# Patient Record
Sex: Female | Born: 1955
Health system: Southern US, Community
[De-identification: ages and names within clinical notes are randomized; demographics above are authoritative.]

## PROBLEM LIST (undated history)

## (undated) DIAGNOSIS — F32A Depression, unspecified: Secondary | ICD-10-CM

## (undated) DIAGNOSIS — I10 Essential (primary) hypertension: Secondary | ICD-10-CM

## (undated) DIAGNOSIS — Z808 Family history of malignant neoplasm of other organs or systems: Secondary | ICD-10-CM

## (undated) DIAGNOSIS — Z803 Family history of malignant neoplasm of breast: Secondary | ICD-10-CM

## (undated) DIAGNOSIS — Z800 Family history of malignant neoplasm of digestive organs: Secondary | ICD-10-CM

## (undated) DIAGNOSIS — C50919 Malignant neoplasm of unspecified site of unspecified female breast: Secondary | ICD-10-CM

## (undated) DIAGNOSIS — F419 Anxiety disorder, unspecified: Secondary | ICD-10-CM

## (undated) DIAGNOSIS — K219 Gastro-esophageal reflux disease without esophagitis: Secondary | ICD-10-CM

## (undated) DIAGNOSIS — E871 Hypo-osmolality and hyponatremia: Secondary | ICD-10-CM

## (undated) DIAGNOSIS — T7840XA Allergy, unspecified, initial encounter: Secondary | ICD-10-CM

## (undated) DIAGNOSIS — E559 Vitamin D deficiency, unspecified: Secondary | ICD-10-CM

## (undated) DIAGNOSIS — E785 Hyperlipidemia, unspecified: Secondary | ICD-10-CM

## (undated) HISTORY — DX: Malignant neoplasm of unspecified site of unspecified female breast: C50.919

## (undated) HISTORY — DX: Depression, unspecified: F32.A

## (undated) HISTORY — DX: Family history of malignant neoplasm of digestive organs: Z80.0

## (undated) HISTORY — DX: Hyperlipidemia, unspecified: E78.5

## (undated) HISTORY — DX: Allergy, unspecified, initial encounter: T78.40XA

## (undated) HISTORY — DX: Essential (primary) hypertension: I10

## (undated) HISTORY — DX: Vitamin D deficiency, unspecified: E55.9

## (undated) HISTORY — PX: OOPHORECTOMY: SHX86

## (undated) HISTORY — PX: SALPINGECTOMY: SHX328

## (undated) HISTORY — PX: NECK SURGERY: SHX720

## (undated) HISTORY — DX: Family history of malignant neoplasm of breast: Z80.3

## (undated) HISTORY — DX: Family history of malignant neoplasm of other organs or systems: Z80.8

## (undated) HISTORY — DX: Anxiety disorder, unspecified: F41.9

---

## 2013-03-21 ENCOUNTER — Other Ambulatory Visit (HOSPITAL_COMMUNITY): Payer: Self-pay | Admitting: Physician Assistant

## 2013-03-21 DIAGNOSIS — Z139 Encounter for screening, unspecified: Secondary | ICD-10-CM

## 2013-03-29 ENCOUNTER — Ambulatory Visit (HOSPITAL_COMMUNITY)
Admission: RE | Admit: 2013-03-29 | Discharge: 2013-03-29 | Disposition: A | Discharge status: Home / Self Care | Payer: Self-pay | Source: Ambulatory Visit | Attending: Physician Assistant | Admitting: Physician Assistant

## 2013-03-29 DIAGNOSIS — Z139 Encounter for screening, unspecified: Secondary | ICD-10-CM

## 2014-06-03 DIAGNOSIS — S13101A Dislocation of unspecified cervical vertebrae, initial encounter: Secondary | ICD-10-CM | POA: Insufficient documentation

## 2021-01-29 ENCOUNTER — Other Ambulatory Visit (HOSPITAL_COMMUNITY): Payer: Self-pay | Admitting: Family Medicine

## 2021-01-29 DIAGNOSIS — N63 Unspecified lump in unspecified breast: Secondary | ICD-10-CM

## 2021-02-17 ENCOUNTER — Other Ambulatory Visit: Payer: Self-pay

## 2021-02-17 ENCOUNTER — Ambulatory Visit (HOSPITAL_COMMUNITY)
Admission: RE | Admit: 2021-02-17 | Discharge: 2021-02-17 | Disposition: A | Discharge status: Home / Self Care | Payer: Self-pay | Source: Ambulatory Visit | Attending: Family Medicine | Admitting: Family Medicine

## 2021-02-17 DIAGNOSIS — N63 Unspecified lump in unspecified breast: Secondary | ICD-10-CM

## 2021-02-18 ENCOUNTER — Other Ambulatory Visit (HOSPITAL_COMMUNITY): Payer: Self-pay | Admitting: Obstetrics and Gynecology

## 2021-02-18 ENCOUNTER — Other Ambulatory Visit (HOSPITAL_COMMUNITY): Payer: Self-pay | Admitting: Nurse Practitioner

## 2021-02-18 DIAGNOSIS — R928 Other abnormal and inconclusive findings on diagnostic imaging of breast: Secondary | ICD-10-CM

## 2021-02-19 ENCOUNTER — Ambulatory Visit: Payer: Self-pay | Admitting: *Deleted

## 2021-02-19 ENCOUNTER — Other Ambulatory Visit: Payer: Self-pay

## 2021-02-19 ENCOUNTER — Other Ambulatory Visit (HOSPITAL_COMMUNITY)
Admission: RE | Admit: 2021-02-19 | Discharge: 2021-02-19 | Disposition: A | Discharge status: Home / Self Care | Payer: Medicaid Other | Source: Ambulatory Visit | Attending: Obstetrics and Gynecology | Admitting: Obstetrics and Gynecology

## 2021-02-19 VITALS — BP 134/82 | Wt 127.1 lb

## 2021-02-19 DIAGNOSIS — N6452 Nipple discharge: Secondary | ICD-10-CM | POA: Insufficient documentation

## 2021-02-19 DIAGNOSIS — Z1239 Encounter for other screening for malignant neoplasm of breast: Secondary | ICD-10-CM

## 2021-02-19 DIAGNOSIS — N6320 Unspecified lump in the left breast, unspecified quadrant: Secondary | ICD-10-CM

## 2021-02-19 NOTE — Patient Instructions (Addendum)
Explained breast self awareness with Samantha Mcfarland. Patient refused Pap smear today. Explained the importance of cervical cancer screenings. Patient will call to schedule appointment when ready. Let her know [facility] will cover Pap smears every 3 years unless has a history of abnormal Pap smears. Referred patient to [facility] for a left breast biopsy per recommendation. Appointment scheduled Tuesday, February 24, 2021 at 0900. Patient aware of appointment and will be there. Discussed smoking cessation with patient. Referred to the [facility] Quitline and gave resources to the free smoking cessation classes at [facility]. Denisa Lemaitre verbalized understanding.  Angline Schweigert, Arvil Chaco, RN 1:02 PM

## 2021-02-19 NOTE — Progress Notes (Signed)
[name] is a 65 y.o. female who presents to [facility] clinic today with complaint of left breast lump, pain, and bloody discharge. Patient states she first noticed the lump in May 2022 and it has increased in size. Patient complained of left breast pain that is constant that has increased since is started in May. Patient rates the pain at a 8-10 out of 10. Patient states the bloody discharge is spontaneous and she has noticed it twice. Patient had a diagnostic mammogram and left breast ultrasound completed 02/17/2021 that a left breast ultrasound guided is recommended for follow-up.   Pap Smear: Pap smear not completed today. Last Pap smear was over 5 years ago and was normal per patient. Patient stated she is unsure where she had her Pap smear completed. Per patient has no history of an abnormal Pap smear. Last Pap smear result is not available in Epic.   Physical exam: Breasts Breasts symmetrical. No skin abnormalities bilateral breasts. No nipple retraction bilateral breasts. No nipple discharge right breast. Spontaneous bloody nipple discharge observed left breast and oozed out when palpated lump on exam. Sample of discharge sent to Cytology for evaluation. No lymphadenopathy. No lumps palpated right breast. Palpated a left breast mass between 3 o'clock and 6 o'clock. Patient complained of pain when palpated left breast lump on exam.  MS DIGITAL DIAG TOMO BILAT  Result Date: 02/17/2021 CLINICAL DATA:  65 year old female with a palpable area of concern in the left breast. EXAM: DIGITAL DIAGNOSTIC BILATERAL MAMMOGRAM WITH TOMOSYNTHESIS AND CAD; ULTRASOUND LEFT BREAST LIMITED TECHNIQUE: Bilateral digital diagnostic mammography and breast tomosynthesis was performed. The images were evaluated with computer-aided detection.; Targeted ultrasound examination of the left breast was performed. COMPARISON:  Previous exams. ACR Breast Density Category d: The breast tissue is extremely dense, which lowers the  sensitivity of mammography. FINDINGS: There is an ill-defined probable mass corresponding to the area of palpable concern in the upper-outer left breast with an additional possible mass seen in the central left breast on the MLO tomograms. No suspicious masses or calcifications are seen in the right breast. Physical examination at site of palpable concern in the left breast reveals a large firm masslike area involving the outer left breast at the approximate 3 o'clock position. Targeted ultrasound of the outer left breast was performed demonstrating several ill-defined masses with a mass at 3 o'clock 5 cm from nipple measuring 1.8 x 2 x 1.2 cm. An additional mass at 3 o'clock 2 cm from nipple measures 0.8 x 0.8 x 1 cm. There is a mass in the immediate retroareolar left breast measuring 1.1 x 0.4 x 1.4 cm. There is an adjacent mixed cystic/solid mass in the retroareolar left breast likely connecting with a duct measuring 0.9 cm. Together these masses measure up to 5.7 cm. No lymphadenopathy seen in the left axilla. IMPRESSION: Large palpable mass/cluster of masses in the outer left breast all together measuring up to 5.7 cm suspicious for malignancy. RECOMMENDATION: Recommend ultrasound-guided biopsy of 2 of the masses in the left breast at the 3 o'clock position, suggest the mass in the immediate retroareolar left breast and the more peripheral mass at 3 o'clock 5 cm from nipple. I have discussed the findings and recommendations with the patient. If applicable, a reminder letter will be sent to the patient regarding the next appointment. BI-RADS CATEGORY  5: Highly suggestive of malignancy. Electronically Signed   By: Everlean Alstrom M.D.   On: 02/17/2021 16:38       Pelvic/Bimanual  Patient refused Pap smear today. Explained the importance of cervical cancer screenings. Patient will call to schedule appointment when ready.   Smoking History: Patient is a current smoker. Discussed smoking cessation with  patient. Referred to the [facility] Quitline and gave resources to the free smoking cessation classes at [facility].   Patient Navigation: Patient education provided. Access to services provided for patient through [facility] program.   Colorectal Cancer Screening: Per patient has never had colonoscopy completed. No complaints today.    Breast and Cervical Cancer Risk Assessment: Patient does not have family history of breast cancer, known genetic mutations, or radiation treatment to the chest before age 53. Patient does not have history of cervical dysplasia, immunocompromised, or DES exposure in-utero.  Risk Assessment     Risk Scores       02/19/2021   Last edited by: Royston Bake, CMA   5-year risk: 2 %   Lifetime risk: 7.9 %            A: B[facility]CP exam without pap smear Complaint of left breast lump, pain, and discharge.  P: Referred patient to [facility] for a left breast biopsy per recommendation. Appointment scheduled Tuesday, February 24, 2021 at 0900.  Loletta Parish, RN 02/19/2021 1:02 PM

## 2021-02-20 LAB — CYTOLOGY - NON PAP

## 2021-02-24 ENCOUNTER — Other Ambulatory Visit (HOSPITAL_COMMUNITY): Payer: Self-pay | Admitting: Obstetrics and Gynecology

## 2021-02-24 ENCOUNTER — Encounter (HOSPITAL_COMMUNITY): Payer: Self-pay

## 2021-02-24 ENCOUNTER — Ambulatory Visit (HOSPITAL_COMMUNITY)
Admission: RE | Admit: 2021-02-24 | Discharge: 2021-02-24 | Disposition: A | Discharge status: Home / Self Care | Payer: Medicaid Other | Source: Ambulatory Visit | Attending: Obstetrics and Gynecology | Admitting: Obstetrics and Gynecology

## 2021-02-24 ENCOUNTER — Telehealth: Payer: Self-pay

## 2021-02-24 ENCOUNTER — Other Ambulatory Visit: Payer: Self-pay

## 2021-02-24 DIAGNOSIS — R928 Other abnormal and inconclusive findings on diagnostic imaging of breast: Secondary | ICD-10-CM

## 2021-02-24 MED ORDER — LIDOCAINE-EPINEPHRINE (PF) 2 %-1:200000 IJ SOLN
INTRAMUSCULAR | Status: AC
  Administered 2021-02-24: 10 mL
  Filled 2021-02-24: qty 20

## 2021-02-24 MED ORDER — LIDOCAINE HCL (PF) 2 % IJ SOLN
INTRAMUSCULAR | Status: AC
  Administered 2021-02-24: 10 mL
  Filled 2021-02-24: qty 20

## 2021-02-24 MED ORDER — SODIUM BICARBONATE 4.2 % IV SOLN
INTRAVENOUS | Status: AC
  Administered 2021-02-24: 2.5 meq
  Filled 2021-02-24: qty 10

## 2021-02-24 NOTE — Telephone Encounter (Signed)
Patient informed pathology results for left breast discharge, no malignant (cancerous) cells identified. Patient verbalized understanding.

## 2021-02-24 NOTE — Sedation Documentation (Signed)
PT tolerated left breast biopsies well today with NAD noted. PT verbalized understanding of discharge instructions. PT ambulated back to the mammogram area this time and given ice pack and d/c instructions.

## 2021-02-27 LAB — SURGICAL PATHOLOGY

## 2021-03-03 ENCOUNTER — Ambulatory Visit (HOSPITAL_COMMUNITY): Payer: Self-pay

## 2021-03-03 ENCOUNTER — Encounter (HOSPITAL_COMMUNITY): Payer: Self-pay

## 2021-03-05 ENCOUNTER — Other Ambulatory Visit: Payer: Self-pay

## 2021-03-05 ENCOUNTER — Other Ambulatory Visit (HOSPITAL_COMMUNITY): Payer: Self-pay | Admitting: General Surgery

## 2021-03-05 ENCOUNTER — Ambulatory Visit (INDEPENDENT_AMBULATORY_CARE_PROVIDER_SITE_OTHER): Payer: Medicaid Other | Admitting: General Surgery

## 2021-03-05 VITALS — BP 120/76 | HR 64 | Temp 98.3°F | Resp 16 | Ht 67.0 in | Wt 123.0 lb

## 2021-03-05 DIAGNOSIS — D0512 Intraductal carcinoma in situ of left breast: Secondary | ICD-10-CM

## 2021-03-05 DIAGNOSIS — Z853 Personal history of malignant neoplasm of breast: Secondary | ICD-10-CM

## 2021-03-05 DIAGNOSIS — N6320 Unspecified lump in the left breast, unspecified quadrant: Secondary | ICD-10-CM | POA: Insufficient documentation

## 2021-03-05 MED ORDER — OXYCODONE HCL 5 MG PO TABS: 5.0000 mg | ORAL_TABLET | ORAL | 0 refills | Status: DC | PRN

## 2021-03-05 NOTE — Progress Notes (Signed)
[facility] History and Physical  Reason for Referral: Left breast DCIS and left breast mass discordant pathology  Referring Physician: Olga Coaster, FNP    Chief Complaint   New Patient (Initial Visit)     Samantha Mcfarland is a 65 y.o. female.  HPI: Samantha Mcfarland is a 65 yo who has noticed a left breast mass for 4+ months. She had a mammogram and ultrasounds with biopsy. DCIS was noted at one site and a 5+ cm mass with fibrocystic findings on pathology. The mass was noted to be discordant with the imaging. She was referred to me for surgery and had recommendations for MRI pending her decisions for surgery.   She has had bloody nipple discharge of the left breast for some time. She has never had prior masses or biopsies. She has a maternal grandmother that had breast cancer and maternal aunts.  Her father had pancreatic cancer and testicular cancer. She has never had a prior abnormal mammogram. She is here today with her sister. She does drink alcohol daily.   Past Medical History:  Diagnosis Date   Hyperlipidemia    Hypertension     Past Surgical History:  Procedure Laterality Date   OOPHORECTOMY     SALPINGECTOMY      Family History  Problem Relation Age of Onset   Stroke Mother    Pancreatic cancer Father     Social History   Tobacco Use   Smoking status: Every Day    Packs/day: 0.25    Types: Cigarettes   Smokeless tobacco: Never   Tobacco comments:    1 pack every 3-4 days  Vaping Use   Vaping Use: Never used  Substance Use Topics   Alcohol use: Yes   Drug use: Never    Medications: I have reviewed the patient's current medications. Allergies as of 03/05/2021       Reactions   Penicillins    Allergic as a child. (Rash ???) Does not recall what type of reaction she has to drug.        Medication List        Accurate as of March 05, 2021  2:34 PM. If you have any questions, ask your nurse or doctor.          STOP taking these  medications    traMADol 50 MG tablet Commonly known as: ULTRAM Stopped by: Virl Cagey, MD       TAKE these medications    lisinopril 10 MG tablet Commonly known as: ZESTRIL Take 10 mg by mouth daily.   nitrofurantoin 100 MG capsule Commonly known as: MACRODANTIN Take 100 mg by mouth 2 (two) times daily.   oxyCODONE 5 MG immediate release tablet Commonly known as: Roxicodone Take 1 tablet (5 mg total) by mouth every 4 (four) hours as needed for severe pain or breakthrough pain. Started by: Virl Cagey, MD         ROS:  A comprehensive review of systems was negative except for: Gastrointestinal: positive for nausea and reflux symptoms Integument/breast: positive for breast lump, breast tenderness, and nipple discharge Musculoskeletal: positive for back pain, neck pain, and joint pain  Blood pressure 120/76, pulse 64, temperature 98.3 F (36.8 C), temperature source Other (Comment), resp. rate 16, height '5\' 7"'$  (1.702 m), weight 123 lb (55.8 kg), SpO2 96 %. Physical Exam Vitals reviewed.  Constitutional:      Appearance: Normal appearance.  HENT:     Head: Normocephalic.  Nose: Nose normal.  Eyes:     Extraocular Movements: Extraocular movements intact.  Cardiovascular:     Rate and Rhythm: Normal rate and regular rhythm.  Pulmonary:     Effort: Pulmonary effort is normal.     Breath sounds: Normal breath sounds.  Chest:  Breasts:    Right: No mass, nipple discharge, skin change or tenderness.     Left: Mass, skin change and tenderness present. No nipple discharge.     Comments: Left breast with bruising, tender 5+cm mass, firm  Abdominal:     General: There is no distension.     Palpations: Abdomen is soft.     Tenderness: There is no abdominal tenderness.  Musculoskeletal:        General: Normal range of motion.     Cervical back: Normal range of motion.  Skin:    General: Skin is warm.  Neurological:     General: No focal deficit  present.     Mental Status: She is alert and oriented to person, place, and time.  Psychiatric:        Mood and Affect: Mood normal.        Behavior: Behavior normal.        Thought Content: Thought content normal.    Results: ADDENDUM REPORT: 02/26/2021 13:31   ADDENDUM: PATHOLOGY revealed: Site A. 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   Pathology results are CONCORDANT with imaging findings, per Dr. Everlean Alstrom.   PATHOLOGY revealed: Site B. BREAST, 3:00. MASS, LEFT, BIOPSY: - Fibrocystic change.   Pathology results are DISCORDANT with imaging findings, per Dr. Everlean Alstrom with excision recommended.   Pathology results and recommendations below were discussed with patient by telephone on 02/26/2021. Patient reported biopsy site within normal limits with slight tenderness at the site. Post biopsy care instructions were reviewed, questions were answered and my direct phone number was provided to patient. Patient was instructed to call [facility] M[facility]ography Department if any concerns or questions arise related to the biopsy.   RECOMMENDATIONS: 1. Surgical consultation for Site A and Site B. Request for surgical consultation was relayed to Kathi Der RT at [facility] Mammography Department by Electa Sniff RN on 02/26/2021.   2. Bilateral breast MRI strongly recommended as large palpable mass/cluster of masses exists in outer LEFT breast all together spanning 5.7 cm which are suspicious for malignancy.   Pathology results reported by Electa Sniff RN on 02/26/2021.     Electronically Signed   By: Everlean Alstrom M.D.   On: 02/26/2021 13:31  CLINICAL DATA:  65 year old female with a palpable area of concern in the left breast.   EXAM: DIGITAL DIAGNOSTIC BILATERAL MAMMOGRAM WITH TOMOSYNTHESIS AND CAD; ULTRASOUND LEFT BREAST LIMITED   TECHNIQUE: Bilateral digital diagnostic  mammography and breast tomosynthesis was performed. The images were evaluated with computer-aided detection.; Targeted ultrasound examination of the left breast was performed.   COMPARISON:  Previous exams.   ACR Breast Density Category d: The breast tissue is extremely dense, which lowers the sensitivity of mammography.   FINDINGS: There is an ill-defined probable mass corresponding to the area of palpable concern in the upper-outer left breast with an additional possible mass seen in the central left breast on the MLO tomograms. No suspicious masses or calcifications are seen in the right breast.   Physical examination at site of palpable concern in the left breast reveals a large firm masslike area  involving the outer left breast at the approximate 3 o'clock position.   Targeted ultrasound of the outer left breast was performed demonstrating several ill-defined masses with a mass at 3 o'clock 5 cm from nipple measuring 1.8 x 2 x 1.2 cm. An additional mass at 3 o'clock 2 cm from nipple measures 0.8 x 0.8 x 1 cm. There is a mass in the immediate retroareolar left breast measuring 1.1 x 0.4 x 1.4 cm. There is an adjacent mixed cystic/solid mass in the retroareolar left breast likely connecting with a duct measuring 0.9 cm. Together these masses measure up to 5.7 cm. No lymphadenopathy seen in the left axilla.   IMPRESSION: Large palpable mass/cluster of masses in the outer left breast all together measuring up to 5.7 cm suspicious for malignancy.   RECOMMENDATION: Recommend ultrasound-guided biopsy of 2 of the masses in the left breast at the 3 o'clock position, suggest the mass in the immediate retroareolar left breast and the more peripheral mass at 3 o'clock 5 cm from nipple.   I have discussed the findings and recommendations with the patient. If applicable, a reminder letter will be sent to the patient regarding the next appointment.   BI-RADS CATEGORY  5: Highly  suggestive of malignancy.     Electronically Signed   By: Everlean Alstrom M.D.   On: 02/17/2021 16:38  CLINICAL DATA:  65 year old female with a palpable area of concern in the left breast.   EXAM: DIGITAL DIAGNOSTIC BILATERAL MAMMOGRAM WITH TOMOSYNTHESIS AND CAD; ULTRASOUND LEFT BREAST LIMITED   TECHNIQUE: Bilateral digital diagnostic mammography and breast tomosynthesis was performed. The images were evaluated with computer-aided detection.; Targeted ultrasound examination of the left breast was performed.   COMPARISON:  Previous exams.   ACR Breast Density Category d: The breast tissue is extremely dense, which lowers the sensitivity of mammography.   FINDINGS: There is an ill-defined probable mass corresponding to the area of palpable concern in the upper-outer left breast with an additional possible mass seen in the central left breast on the MLO tomograms. No suspicious masses or calcifications are seen in the right breast.   Physical examination at site of palpable concern in the left breast reveals a large firm masslike area involving the outer left breast at the approximate 3 o'clock position.   Targeted ultrasound of the outer left breast was performed demonstrating several ill-defined masses with a mass at 3 o'clock 5 cm from nipple measuring 1.8 x 2 x 1.2 cm. An additional mass at 3 o'clock 2 cm from nipple measures 0.8 x 0.8 x 1 cm. There is a mass in the immediate retroareolar left breast measuring 1.1 x 0.4 x 1.4 cm. There is an adjacent mixed cystic/solid mass in the retroareolar left breast likely connecting with a duct measuring 0.9 cm. Together these masses measure up to 5.7 cm. No lymphadenopathy seen in the left axilla.   IMPRESSION: Large palpable mass/cluster of masses in the outer left breast all together measuring up to 5.7 cm suspicious for malignancy.   RECOMMENDATION: Recommend ultrasound-guided biopsy of 2 of the masses in the  left breast at the 3 o'clock position, suggest the mass in the immediate retroareolar left breast and the more peripheral mass at 3 o'clock 5 cm from nipple.   I have discussed the findings and recommendations with the patient. If applicable, a reminder letter will be sent to the patient regarding the next appointment.   BI-RADS CATEGORY  5: Highly suggestive of malignancy.  Electronically Signed   By: Everlean Alstrom M.D.   On: 02/17/2021 16:38    Assessment & Plan:  Bhumi Kerstiens is a 65 y.o. female with a left breast mass that is suspicious, 5cm and discordant with pathology, high grade DCIS noted on pathology. She does not want to do any radiation. She does not want to pursue MRI due to possibility of delays and fact that she would need mastectomy if she is not pursuing radiation.   Plan for left mastectomy and sentinel lymph node biopsy for DCIS high grade and large mass suspicious for cancer. MRI is not desired by the patient. Discussed risk of surgery bleeding, infection, needing more surgery, needing additional therapies. Discussed overnight stay and JP drain placement. Discussed lymphedema risk.   All questions were answered to the satisfaction of the patient and family.   Virl Cagey 03/05/2021, 2:34 PM

## 2021-03-05 NOTE — Patient Instructions (Signed)
Breast Cancer, Female Breast cancer is a malignant growth of tissue (tumor) in the breast. Unlike noncancerous (benign) tumors, malignant tumors are cancerous and can spread to other parts of the body. The two most common types of breast cancer start in the milk ducts (ductal carcinoma) or in the lobules where milk is made in the breast (lobular carcinoma). Breast cancer is one of the most common types of cancer in women. What are the causes? The exact cause of female breast cancer is unknown. What increases the risk? The following factors may make you more likely to develop this condition: Being older than 65 years of age. Race and ethnicity. Caucasian women generally have an increased risk, but African-American women are more likely to develop the disease before age 34. Having a family history of breast cancer. Having had breast cancer in the past. Having certain noncancerous conditions of the breast, such as dense breast tissue. Having the BRCA1 and BRCA2 genes. Having a history of radiation exposure. Obesity. Starting menopause after age 14. Starting your menstrual periods before age 69. Having never been pregnant or having your first child after age 26. Having never breastfed. Using hormone therapy after menopause. Using birth control pills. Drinking more than one alcoholic drink a day. Exposure to the drug DES, which was given to pregnant women from the 1940s to the 1970s. What are the signs or symptoms? Symptoms of this condition include: A painless lump or thickening in your breast. Changes in the size or shape of your breast. Breast skin changes, such as puckering or dimpling. Nipple abnormalities, such as scaling, crustiness, redness, or pulling in (retraction). Nipple discharge that is bloody or clear. How is this diagnosed? This condition may be diagnosed by: Taking your medical history and doing a physical exam. During the exam, your health care provider will feel the  tissue around your breast and under your arms. Taking a sample of nipple discharge. The sample will be examined under a microscope. Performing imaging tests, such as breast X-rays (mammogram), breast ultrasound exams, or an MRI. Taking a tissue sample (biopsy) from the breast. The sample will be examined under a microscope to look for cancer cells. Taking a sample from the lymph nodes near the affected breast (sentinel node biopsy). Your cancer will be staged to determine its severity and extent. Staging is a careful attempt to find out the size of the tumor, whether the cancer has spread, and if so, to what parts of the body. Staging also includes testing your tumor for certain receptors, such as estrogen, progesterone, and human epidermal growth factor receptor 2 (HER2). This will help your cancer care team decide on a treatment that will work best for you. You may need to have more tests to determine the stage of your cancer. Stages include the following: Stage 0--The tumor has not spread to other breast tissue. Stage I--The cancer is only found in the breast or may be in the lymph nodes. The tumor may be up to  in (2 cm) wide. Stage II--The cancer has spread to nearby lymph nodes. The tumor may be up to 2 in (5 cm) wide. Stage III--The cancer has spread to more distant lymph nodes. The tumor may be larger than 2 in (5 cm) wide. Stage IV--The cancer has spread to other parts of the body, such as the bones, brain, liver, or lungs. How is this treated? Treatment for this condition depends on the type and stage of the breast cancer. It may be treated  with: Surgery. This may involve breast-conserving surgery (lumpectomy or partial mastectomy) in which only the part of the breast containing the cancer is removed. Some normal tissue surrounding this area may also be removed. In some cases, surgery may be done to remove the entire breast (mastectomy) and nipple. Lymph nodes may also be removed. Radiation  therapy, which uses high-energy rays to kill cancer cells. Chemotherapy, which is the use of drugs to kill cancer cells. Hormone therapy, which involves taking medicine to adjust the hormone levels in your body. You may take medicine to decrease your estrogen levels. This can help stop cancer cells from growing. Targeted therapy, in which drugs are used to block the growth and spread of cancer cells. These drugs target a specific part of the cancer cell and usually cause fewer side effects than chemotherapy. Targeted therapy may be used alone or in combination with chemotherapy. A combination of surgery, radiation, chemotherapy, or hormone therapy may be needed to treat breast cancer. Follow these instructions at home: Take over-the-counter and prescription medicines only as told by your health care provider. Eat a healthy diet. A healthy diet includes lots of fruits and vegetables, low-fat dairy products, lean meats, and fiber. Make sure half your plate is filled with fruits or vegetables. Choose high-fiber foods such as whole-grain breads and cereals. Consider joining a support group. This may help you learn to cope with the stress of having breast cancer. Talk to your health care team about exercise and physical activity. The right exercise program can: Help prevent or reduce symptoms such as fatigue or depression. Improve overall health and survival rates. Keep all follow-up visits as told by your health care provider. This is important. Where to find more information American [employer] Society: www.cancer.[place]: www.cancer.gov Contact a health care provider if: You have a sudden increase in pain. You have any symptoms or changes that concern you. You lose weight without trying. You notice a new lump in either breast or under your arm. You develop swelling in either arm or hand. You have a fever. You notice new fatigue or weakness. Get help right away if: You have  chest pain or trouble breathing. You faint. Summary Breast cancer is a malignant growth of tissue (tumor) in the breast. Your cancer will be staged to determine its severity and extent. Treatment for this condition depends on the type and stage of the breast cancer. This information is not intended to replace advice given to you by your health care provider. Make sure you discuss any questions you have with your health care provider. Document Revised: 05/20/2017 Document Reviewed: 01/31/2017 Elsevier Patient Education  2022 [employer].  Total Mastectomy A total mastectomy and a modified radical mastectomy are surgeries that are done as part of treatment for breast cancer. You will have one of those types of surgery. Both types involve removing a breast. In a total mastectomy (simple mastectomy), all breast tissue including the nipple will be removed. In a modified radical mastectomy, lymph nodes under the arm will be removed along with the breast and nipple. Some of the lining over the muscle tissues under the breast may also be removed. These procedures may also be used to help prevent breast cancer. A preventive (prophylactic) mastectomy may be done if you are at an increased risk of breast cancer due to harmful changes (mutations) in certain genes (BRCA genes). In that case, the procedure involves removing both of your breasts. This can reduce your risk of developing  breast cancer in the future. For a transgender person, a total mastectomy may be done as part of a surgical transition from female to female. Let your health care provider know about: Any allergies you have. All medicines you are taking, including vitamins, herbs, eye drops, creams, and over-the-counter medicines. Any problems you or family members have had with anesthetic medicines. Any blood disorders you have. Any surgeries you have had. Any medical conditions you have. Whether you are pregnant or may be pregnant. What are  the risks? Generally, this is a safe procedure. However, problems may occur, including: Pain. Infection. Bleeding. Allergic reactions to medicines. Scar tissue. Chest numbness on the side of the surgery. Fluid buildup under the skin flaps where your breast was removed (seroma). Sensation of throbbing or tingling. Stress or sadness from losing your breast. If you have the lymph nodes under your arm removed, you may have arm swelling, weakness, or numbness on the same side of your body as your surgery. What happens before the procedure? Medicine Ask your health care provider about: Changing or stopping your regular medicines. This is especially important if you are taking diabetes medicines or blood thinners. Taking medicines such as aspirin and ibuprofen. These medicines can thin your blood. Do not take these medicines unless your health care provider tells you to take them. Taking over-the-counter medicines, vitamins, herbs, and supplements. Your health care team may give you antibiotic medicine to help prevent infection. General instructions You may be checked for extra fluid around your lymph nodes (lymphedema). Plan to have someone take you home from the hospital or clinic. Plan to have a responsible adult care for you for at least 24 hours after you leave the hospital or clinic. This is important. Ask your health care provider how your surgical site will be marked or identified. You may be asked to shower with a germ-killing soap. What happens during the procedure? To lower your risk of infection: Your health care team will wash or sanitize their hands. Your skin will be washed with soap. An IV will be inserted into one of your veins. You will be given a medicine to make you fall asleep (general anesthetic). A wide incision will be made around your nipple. The skin and nipple inside the incision will be removed along with all breast tissue. If you are having a modified radical  mastectomy: The lining over your chest muscles will be removed. The incision may be extended to reach the lymph nodes under your arm, or a second incision may be made. Lymph nodes will be removed. Breast tissue and lymph nodes that are removed will be sent to the lab for testing. You may have a drainage tube inserted into your incision to collect fluid that builds up after surgery. This tube will be connected to a suction bulb on the outside of your body to remove the fluid. Your incision or incisions will be closed with stitches (sutures). A bandage (dressing) will be placed over your breast area. If lymph nodes were removed, a dressing will also be placed under your arm. The procedure may vary among health care providers and hospitals. What happens after the procedure? Your blood pressure, heart rate, breathing rate, and blood oxygen level will be monitored until the medicines you were given have worn off. You will be given pain medicine as needed. You will be encouraged to get up and walk as soon as you can. Your IV can be removed when you are able to eat and drink.  You may have a drainage tube in place for 2-3 days to prevent a collection of blood (hematoma) from developing in the breast area. You will be given instructions about caring for the drain before you go home. A pressure bandage may be applied for 1-2 days to prevent bleeding or swelling. Ask your health care provider how to care for your pressure bandage at home. Summary In a total mastectomy (simple mastectomy), all breast tissue including the nipple will be removed. In a modified radical mastectomy, the lymph nodes under the arm will be removed along with the breast and nipple. Before the procedure, follow instructions from your health care provider about eating and drinking, and ask about changing or stopping your regular medicines. You will be given a medicine to make you fall asleep (general anesthetic) during the  procedure. This information is not intended to replace advice given to you by your health care provider. Make sure you discuss any questions you have with your health care provider. Document Revised: 12/28/2019 Document Reviewed: 12/28/2019 Elsevier Patient Education  2022 [employer].  Sentinel Lymph Node Biopsy A sentinel lymph node biopsy is a procedure to identify, remove, and check a lymph node for cancer. Lymph is excess fluid from the tissues in your body. It is removed through the lymphatic system. This system is also a part of your body's defense system (immune system). The system includes lymph nodes and lymph vessels. Certain types of cancer can spread to nearby lymph nodes through the lymphatic system. The cancer usually spreads to one lymph node first, and then to others. The first lymph node that the cancer could spread to is called the sentinel lymph node. In some cases, there may be more than one sentinel lymph node. You may have this procedure to determine whether your cancer has spread and to help your health care provider plan your treatment. If no cancer is found in the sentinel lymph node, it is very unlikely that the cancer has spread to any of your other lymph nodes. If cancer is found in the sentinel lymph node, additional lymph nodes may be removed for testing. Tell a health care provider about: Any allergies you have, including any history of problems with contrast dye. All medicines you are taking, including vitamins, herbs, eye drops, creams, and over-the-counter medicines. Any problems you or family members have had with anesthetic medicines. Any blood disorders you have. Any surgeries you have had. Any medical conditions you have. Whether you are pregnant or may be pregnant. What are the risks? Generally, this is a safe procedure. However, problems may occur, including: Infection. Bleeding. Allergic reaction to medicines or dyes. Staining of the skin where dye  is injected. Damaged lymph vessels, causing a buildup of fluid (lymphedema). Pain or bruising at the biopsy site. What happens before the procedure? Medicines Ask your health care provider about: Changing or stopping your regular medicines. This is especially important if you are taking diabetes medicines or blood thinners. Taking medicines such as aspirin and ibuprofen. These medicines can thin your blood. Do not take these medicines before your procedure unless your health care provider instructs you to do so. Taking over-the-counter medicines, vitamins, herbs, and supplements. Surgery safety Ask your health care provider: How your surgery site will be marked. What steps will be taken to help prevent infection. These steps may include: Removing hair at the surgery site. Washing skin with a germ-killing soap. Receiving antibiotic medicine. General instructions Do not use any products that contain nicotine  or tobacco for at least 2 weeks before the procedure. These products include cigarettes, chewing tobacco, and vaping devices, such as e-cigarettes. If you need help quitting, ask your health care provider. You may have blood tests to make sure your blood clots normally. Plan to have a responsible adult take you home from the hospital or clinic. What happens during the procedure?  An IV will be inserted into one of your veins. You will be given one or more of the following: A medicine to help you relax (sedative). A medicine to numb the area (local anesthetic). A medicine to make you fall asleep (general anesthetic). Blue dye, a radioactive substance, or both will be injected around the tumor. Both the dye and the radioactive substance will follow the same path that a spreading cancer would likely follow. The blue dye will reach your lymph nodes quickly. It may be given just before surgery. The radioactive substance will take longer to reach your lymph nodes. It may be given 2-24 hours  before surgery, depending on your hospital. If a radioactive substance was injected, a scanner will show where the substance has spread. This will help identify the sentinel lymph node. The surgeon will make a small incision. If blue dye was injected, your surgeon will look for any lymph nodes that have picked up the dye. Sentinel lymph nodes will be removed and sent to a lab for testing. If no cancer is found, no other lymph nodes will be removed. It is unlikely the cancer has spread. If cancer is found, the surgeon will remove other lymph nodes for testing. This may happen during the same procedure or at a later time. The incision will be closed with stitches (sutures) or skin glue. A small dressing may be taped over the incision area. The procedure may vary among health care providers and hospitals. What happens after the procedure? Your blood pressure, heart rate, breathing rate, and blood oxygen level will be monitored until you leave the hospital or clinic. Your urine or stool may be blue for the next 24-48 hours. This is normal. It is caused by the dye that is used during the procedure. If you were given a sedative during the procedure, it can affect you for several hours. Do not drive or operate machinery until your health care provider says that it is safe. It is up to you to get the results of your procedure. Ask your health care provider, or the department that is doing the procedure, when your results will be ready. Summary A sentinel lymph node biopsy is a procedure to identify, remove, and examine one or more lymph nodes for cancer. If you have cancer, you may have this procedure to determine whether your cancer has spread. If no cancer is found in the sentinel lymph node, it is very unlikely that the cancer has spread to any other lymph nodes. If cancer is found in the sentinel lymph node, your surgeon may remove additional lymph nodes for testing. This information is not intended to  replace advice given to you by your health care provider. Make sure you discuss any questions you have with your health care provider. Document Revised: 03/20/2020 Document Reviewed: 03/20/2020 Elsevier Patient Education  [place].

## 2021-03-06 NOTE — H&P (Signed)
[facility] History and Physical   Reason for Referral: Left breast DCIS and left breast mass discordant pathology  Referring Physician: Olga Coaster, FNP      Chief Complaint   New Patient (Initial Visit)        Samantha Mcfarland is a 65 y.o. female.  HPI: Samantha Mcfarland is a 65 yo who has noticed a left breast mass for 4+ months. She had a mammogram and ultrasounds with biopsy. DCIS was noted at one site and a 5+ cm mass with fibrocystic findings on pathology. The mass was noted to be discordant with the imaging. She was referred to me for surgery and had recommendations for MRI pending her decisions for surgery.    She has had bloody nipple discharge of the left breast for some time. She has never had prior masses or biopsies. She has a maternal grandmother that had breast cancer and maternal aunts.  Her father had pancreatic cancer and testicular cancer. She has never had a prior abnormal mammogram. She is here today with her sister. She does drink alcohol daily.        Past Medical History:  Diagnosis Date   Hyperlipidemia     Hypertension             Past Surgical History:  Procedure Laterality Date   OOPHORECTOMY       SALPINGECTOMY               Family History  Problem Relation Age of Onset   Stroke Mother     Pancreatic cancer Father        Social History         Tobacco Use   Smoking status: Every Day      Packs/day: 0.25      Types: Cigarettes   Smokeless tobacco: Never   Tobacco comments:      1 pack every 3-4 days  Vaping Use   Vaping Use: Never used  Substance Use Topics   Alcohol use: Yes   Drug use: Never      Medications: I have reviewed the patient's current medications. Allergies as of 03/05/2021         Reactions    Penicillins      Allergic as a child. (Rash ???) Does not recall what type of reaction she has to drug.            Medication List           Accurate as of March 05, 2021  2:34 PM. If you have any questions,  ask your nurse or doctor.              STOP taking these medications     traMADol 50 MG tablet Commonly known as: ULTRAM Stopped by: Virl Cagey, MD           TAKE these medications     lisinopril 10 MG tablet Commonly known as: ZESTRIL Take 10 mg by mouth daily.    nitrofurantoin 100 MG capsule Commonly known as: MACRODANTIN Take 100 mg by mouth 2 (two) times daily.    oxyCODONE 5 MG immediate release tablet Commonly known as: Roxicodone Take 1 tablet (5 mg total) by mouth every 4 (four) hours as needed for severe pain or breakthrough pain. Started by: Virl Cagey, MD               ROS:  A comprehensive review of systems was negative except for: Gastrointestinal: positive for  nausea and reflux symptoms Integument/breast: positive for breast lump, breast tenderness, and nipple discharge Musculoskeletal: positive for back pain, neck pain, and joint pain   Blood pressure 120/76, pulse 64, temperature 98.3 F (36.8 C), temperature source Other (Comment), resp. rate 16, height '5\' 7"'$  (1.702 m), weight 123 lb (55.8 kg), SpO2 96 %. Physical Exam Vitals reviewed.  Constitutional:      Appearance: Normal appearance.  HENT:     Head: Normocephalic.     Nose: Nose normal.  Eyes:     Extraocular Movements: Extraocular movements intact.  Cardiovascular:     Rate and Rhythm: Normal rate and regular rhythm.  Pulmonary:     Effort: Pulmonary effort is normal.     Breath sounds: Normal breath sounds.  Chest:  Breasts:    Right: No mass, nipple discharge, skin change or tenderness.     Left: Mass, skin change and tenderness present. No nipple discharge.     Comments: Left breast with bruising, tender 5+cm mass, firm  Abdominal:     General: There is no distension.     Palpations: Abdomen is soft.     Tenderness: There is no abdominal tenderness.  Musculoskeletal:        General: Normal range of motion.     Cervical back: Normal range of motion.  Skin:     General: Skin is warm.  Neurological:     General: No focal deficit present.     Mental Status: She is alert and oriented to person, place, and time.  Psychiatric:        Mood and Affect: Mood normal.        Behavior: Behavior normal.        Thought Content: Thought content normal.      Results: ADDENDUM REPORT: 02/26/2021 13:31   ADDENDUM: PATHOLOGY revealed: Site A. 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   Pathology results are CONCORDANT with imaging findings, per Dr. Everlean Alstrom.   PATHOLOGY revealed: Site B. BREAST, 3:00. MASS, LEFT, BIOPSY: - Fibrocystic change.   Pathology results are DISCORDANT with imaging findings, per Dr. Everlean Alstrom with excision recommended.   Pathology results and recommendations below were discussed with patient by telephone on 02/26/2021. Patient reported biopsy site within normal limits with slight tenderness at the site. Post biopsy care instructions were reviewed, questions were answered and my direct phone number was provided to patient. Patient was instructed to call [facility] Mammography Department if any concerns or questions arise related to the biopsy.   RECOMMENDATIONS: 1. Surgical consultation for Site A and Site B. Request for surgical consultation was relayed to Samantha Mcfarland RT at [facility] Mammography Department by Samantha Sniff RN on 02/26/2021.   2. Bilateral breast MRI strongly recommended as large palpable mass/cluster of masses exists in outer LEFT breast all together spanning 5.7 cm which are suspicious for malignancy.   Pathology results reported by Samantha Sniff RN on 02/26/2021.     Electronically Signed   By: Everlean Alstrom M.D.   On: 02/26/2021 13:31   CLINICAL DATA:  65 year old female with a palpable area of concern in the left breast.   EXAM: DIGITAL DIAGNOSTIC BILATERAL MAMMOGRAM WITH TOMOSYNTHESIS AND  CAD; ULTRASOUND LEFT BREAST LIMITED   TECHNIQUE: Bilateral digital diagnostic mammography and breast tomosynthesis was performed. The images were evaluated with computer-aided detection.; Targeted ultrasound examination of the left breast was performed.   COMPARISON:  Previous exams.  ACR Breast Density Category d: The breast tissue is extremely dense, which lowers the sensitivity of mammography.   FINDINGS: There is an ill-defined probable mass corresponding to the area of palpable concern in the upper-outer left breast with an additional possible mass seen in the central left breast on the MLO tomograms. No suspicious masses or calcifications are seen in the right breast.   Physical examination at site of palpable concern in the left breast reveals a large firm masslike area involving the outer left breast at the approximate 3 o'clock position.   Targeted ultrasound of the outer left breast was performed demonstrating several ill-defined masses with a mass at 3 o'clock 5 cm from nipple measuring 1.8 x 2 x 1.2 cm. An additional mass at 3 o'clock 2 cm from nipple measures 0.8 x 0.8 x 1 cm. There is a mass in the immediate retroareolar left breast measuring 1.1 x 0.4 x 1.4 cm. There is an adjacent mixed cystic/solid mass in the retroareolar left breast likely connecting with a duct measuring 0.9 cm. Together these masses measure up to 5.7 cm. No lymphadenopathy seen in the left axilla.   IMPRESSION: Large palpable mass/cluster of masses in the outer left breast all together measuring up to 5.7 cm suspicious for malignancy.   RECOMMENDATION: Recommend ultrasound-guided biopsy of 2 of the masses in the left breast at the 3 o'clock position, suggest the mass in the immediate retroareolar left breast and the more peripheral mass at 3 o'clock 5 cm from nipple.   I have discussed the findings and recommendations with the patient. If applicable, a reminder letter will be sent to  the patient regarding the next appointment.   BI-RADS CATEGORY  5: Highly suggestive of malignancy.     Electronically Signed   By: Everlean Alstrom M.D.   On: 02/17/2021 16:38  CLINICAL DATA:  65 year old female with a palpable area of concern in the left breast.   EXAM: DIGITAL DIAGNOSTIC BILATERAL MAMMOGRAM WITH TOMOSYNTHESIS AND CAD; ULTRASOUND LEFT BREAST LIMITED   TECHNIQUE: Bilateral digital diagnostic mammography and breast tomosynthesis was performed. The images were evaluated with computer-aided detection.; Targeted ultrasound examination of the left breast was performed.   COMPARISON:  Previous exams.   ACR Breast Density Category d: The breast tissue is extremely dense, which lowers the sensitivity of mammography.   FINDINGS: There is an ill-defined probable mass corresponding to the area of palpable concern in the upper-outer left breast with an additional possible mass seen in the central left breast on the MLO tomograms. No suspicious masses or calcifications are seen in the right breast.   Physical examination at site of palpable concern in the left breast reveals a large firm masslike area involving the outer left breast at the approximate 3 o'clock position.   Targeted ultrasound of the outer left breast was performed demonstrating several ill-defined masses with a mass at 3 o'clock 5 cm from nipple measuring 1.8 x 2 x 1.2 cm. An additional mass at 3 o'clock 2 cm from nipple measures 0.8 x 0.8 x 1 cm. There is a mass in the immediate retroareolar left breast measuring 1.1 x 0.4 x 1.4 cm. There is an adjacent mixed cystic/solid mass in the retroareolar left breast likely connecting with a duct measuring 0.9 cm. Together these masses measure up to 5.7 cm. No lymphadenopathy seen in the left axilla.   IMPRESSION: Large palpable mass/cluster of masses in the outer left breast all together measuring up to 5.7 cm suspicious for malignancy.  RECOMMENDATION: Recommend ultrasound-guided biopsy of 2 of the masses in the left breast at the 3 o'clock position, suggest the mass in the immediate retroareolar left breast and the more peripheral mass at 3 o'clock 5 cm from nipple.   I have discussed the findings and recommendations with the patient. If applicable, a reminder letter will be sent to the patient regarding the next appointment.   BI-RADS CATEGORY  5: Highly suggestive of malignancy.     Electronically Signed   By: Everlean Alstrom M.D.   On: 02/17/2021 16:38     Assessment & Plan:  Hennesy Icenhower is a 65 y.o. female with a left breast mass that is suspicious, 5cm and discordant with pathology, high grade DCIS noted on pathology. She does not want to do any radiation. She does not want to pursue MRI due to possibility of delays and fact that she would need mastectomy if she is not pursuing radiation.    Plan for left mastectomy and sentinel lymph node biopsy for DCIS high grade and large mass suspicious for cancer. MRI is not desired by the patient. Discussed risk of surgery bleeding, infection, needing more surgery, needing additional therapies. Discussed overnight stay and JP drain placement. Discussed lymphedema risk.    All questions were answered to the satisfaction of the patient and family.     Virl Cagey 03/05/2021, 2:34 PM

## 2021-03-17 NOTE — Patient Instructions (Signed)
Samantha Mcfarland  03/17/2021     @PREFPERIOPPHARMACY @   Your procedure is scheduled on 03/23/2021.   Report to [facility] at  0730 A.M.   Call this number if you have problems the morning of surgery:  [phone]   Remember:  Do not eat or drink after midnight.      Take these medicines the morning of surgery with A SIP OF WATER                            prilosec.     Do not wear jewelry, make-up or nail polish.  Do not wear lotions, powders, or perfumes, or deodorant.  Do not shave 48 hours prior to surgery.  Men may shave face and neck.  Do not bring valuables to the hospital.  [facility] is not responsible for any belongings or valuables.  Contacts, dentures or bridgework may not be worn into surgery.  Leave your suitcase in the car.  After surgery it may be brought to your room.  For patients admitted to the hospital, discharge time will be determined by your treatment team.  Patients discharged the day of surgery will not be allowed to drive home and must have someone with them for 24 hours.    Special instructions:   DO NOT smoke tobacco or vape for 24 hours before your procedure.  Please read over the following fact sheets that you were given. Coughing and Deep Breathing, Surgical Site Infection Prevention, Anesthesia Post-op Instructions, and Care and Recovery After Surgery      Sentinel Lymph Node Biopsy in Breast Cancer Treatment, Care After This sheet gives you information about how to care for yourself after your procedure. Your health care provider may also give you more specific instructions. If you have problems or questions, contact your health care provider. What can I expect after the procedure? After the procedure, it is common to have: Blue urine and darker stool for the next 24 hours. This is normal. It is caused by the dye that was used during the procedure. Blue skin at the injection site. This may last for up to 8 weeks. Numbness,  tingling, or pain near your incision. Swelling or bruising near your incision. Follow these instructions at home: Activity Avoid activities that take a lot of effort. Return to your normal activities as told by your health care provider. Ask your health care provider what activities are safe for you. Incision care  Follow instructions from your health care provider about how to take care of your incision. Make sure you: Wash your hands with soap and water for at least 20 seconds before and after you change your bandage (dressing). If soap and water are not available, use hand sanitizer. Change your dressing as told by your health care provider. Leave stitches (sutures), skin glue, or adhesive strips in place. These skin closures may need to stay in place for 2 weeks or longer. If adhesive strip edges start to loosen and curl up, you may trim the loose edges. Do not remove adhesive strips completely unless your health care provider tells you to do that. Do not take baths, swim, or use a hot tub until your health care provider approves. Ask your health care provider if you can take showers. You may be able to shower 24 hours after your procedure. Check your biopsy site every day for signs of infection. Check for:  More redness, swelling, or pain. Fluid or blood. Warmth. Pus or a bad smell. General instructions If you were given a surgical bra, wear it for the next 48 hours. You may remove the bra to shower. Take over-the-counter and prescription medicines only as told by your health care provider. You may resume your regular diet. Do not have your blood pressure taken or have blood drawn from the arm on the side of the biopsy until your health care provider says it is okay. You may need to be screened for extra fluid around the lymph nodes and swelling in the breast and arm (lymphedema). Follow instructions from your health care provider about how often you should be checked. Keep all follow-up  visits as told by your health care provider. This is important. Contact a health care provider if you have: Nausea and vomiting. Any of these signs of infection: More redness, swelling, or pain around your biopsy site. Fluid or blood coming from your incision. Warmth coming from your incision area. Pus or a bad smell coming from your incision. Any new bruising. Chills or a fever. Get help right away if you have: Pain that is getting worse and your medicine is not helping. Vomiting that will not stop. Chest pain or trouble breathing. Summary After the procedure, it is common to have blue urine and darker stool for the next 24 hours. This is normal. It is caused by the dye that was used during the procedure. Follow instructions from your health care provider about how to take care of your incision. Do not have your blood pressure taken or have blood drawn from the arm on the side of the biopsy until your health care provider says it is okay. You may need to be screened for extra fluid around the lymph nodes and swelling in the breast and arm (lymphedema). Follow instructions from your health care provider about how often you should be checked. This information is not intended to replace advice given to you by your health care provider. Make sure you discuss any questions you have with your health care provider. Document Revised: 01/22/2019 Document Reviewed: 01/22/2019 Elsevier Patient Education  [place] Anesthesia, Adult, Care After This sheet gives you information about how to care for yourself after your procedure. Your health care provider may also give you more specific instructions. If you have problems or questions, contact your health care provider. What can I expect after the procedure? After the procedure, the following side effects are common: Pain or discomfort at the IV site. Nausea. Vomiting. Sore throat. Trouble concentrating. Feeling cold or  chills. Feeling weak or tired. Sleepiness and fatigue. Soreness and body aches. These side effects can affect parts of the body that were not involved in surgery. Follow these instructions at home: For the time period you were told by your health care provider:  Rest. Do not participate in activities where you could fall or become injured. Do not drive or use machinery. Do not drink alcohol. Do not take sleeping pills or medicines that cause drowsiness. Do not make important decisions or sign legal documents. Do not take care of children on your own. Eating and drinking Follow any instructions from your health care provider about eating or drinking restrictions. When you feel hungry, start by eating small amounts of foods that are soft and easy to digest (bland), such as toast. Gradually return to your regular diet. Drink enough fluid to keep your urine pale yellow. If you vomit, rehydrate  by drinking water, juice, or clear broth. General instructions If you have sleep apnea, surgery and certain medicines can increase your risk for breathing problems. Follow instructions from your health care provider about wearing your sleep device: Anytime you are sleeping, including during daytime naps. While taking prescription pain medicines, sleeping medicines, or medicines that make you drowsy. Have a responsible adult stay with you for the time you are told. It is important to have someone help care for you until you are awake and alert. Return to your normal activities as told by your health care provider. Ask your health care provider what activities are safe for you. Take over-the-counter and prescription medicines only as told by your health care provider. If you smoke, do not smoke without supervision. Keep all follow-up visits as told by your health care provider. This is important. Contact a health care provider if: You have nausea or vomiting that does not get better with medicine. You  cannot eat or drink without vomiting. You have pain that does not get better with medicine. You are unable to pass urine. You develop a skin rash. You have a fever. You have redness around your IV site that gets worse. Get help right away if: You have difficulty breathing. You have chest pain. You have blood in your urine or stool, or you vomit blood. Summary After the procedure, it is common to have a sore throat or nausea. It is also common to feel tired. Have a responsible adult stay with you for the time you are told. It is important to have someone help care for you until you are awake and alert. When you feel hungry, start by eating small amounts of foods that are soft and easy to digest (bland), such as toast. Gradually return to your regular diet. Drink enough fluid to keep your urine pale yellow. Return to your normal activities as told by your health care provider. Ask your health care provider what activities are safe for you. This information is not intended to replace advice given to you by your health care provider. Make sure you discuss any questions you have with your health care provider. Document Revised: 02/21/2020 Document Reviewed: 09/20/2019 Elsevier Patient Education  2022 [place]. How to Use Chlorhexidine for Bathing Chlorhexidine gluconate (CHG) is a germ-killing (antiseptic) solution that is used to clean the skin. It can get rid of the bacteria that normally live on the skin and can keep them away for about 24 hours. To clean your skin with CHG, you may be given: A CHG solution to use in the shower or as part of a sponge bath. A prepackaged cloth that contains CHG. Cleaning your skin with CHG may help lower the risk for infection: While you are staying in the intensive care unit of the hospital. If you have a vascular access, such as a central line, to provide short-term or long-term access to your veins. If you have a catheter to drain urine from your  bladder. If you are on a ventilator. A ventilator is a machine that helps you breathe by moving air in and out of your lungs. After surgery. What are the risks? Risks of using CHG include: A skin reaction. Hearing loss, if CHG gets in your ears and you have a perforated eardrum. Eye injury, if CHG gets in your eyes and is not rinsed out. The CHG product catching fire. Make sure that you avoid smoking and flames after applying CHG to your skin. Do not use  CHG: If you have a chlorhexidine allergy or have previously reacted to chlorhexidine. On babies younger than 75 months of age. How to use CHG solution Use CHG only as told by your health care provider, and follow the instructions on the label. Use the full amount of CHG as directed. Usually, this is one bottle. During a shower Follow these steps when using CHG solution during a shower (unless your health care provider gives you different instructions): Start the shower. Use your normal soap and shampoo to wash your face and hair. Turn off the shower or move out of the shower stream. Pour the CHG onto a clean washcloth. Do not use any type of brush or rough-edged sponge. Starting at your neck, lather your body down to your toes. Make sure you follow these instructions: If you will be having surgery, pay special attention to the part of your body where you will be having surgery. Scrub this area for at least 1 minute. Do not use CHG on your head or face. If the solution gets into your ears or eyes, rinse them well with water. Avoid your genital area. Avoid any areas of skin that have broken skin, cuts, or scrapes. Scrub your back and under your arms. Make sure to wash skin folds. Let the lather sit on your skin for 1-2 minutes or as long as told by your health care provider. Thoroughly rinse your entire body in the shower. Make sure that all body creases and crevices are rinsed well. Dry off with a clean towel. Do not put any substances on  your body afterward--such as powder, lotion, or perfume--unless you are told to do so by your health care provider. Only use lotions that are recommended by the manufacturer. Put on clean clothes or pajamas. If it is the night before your surgery, sleep in clean sheets.  During a sponge bath Follow these steps when using CHG solution during a sponge bath (unless your health care provider gives you different instructions): Use your normal soap and shampoo to wash your face and hair. Pour the CHG onto a clean washcloth. Starting at your neck, lather your body down to your toes. Make sure you follow these instructions: If you will be having surgery, pay special attention to the part of your body where you will be having surgery. Scrub this area for at least 1 minute. Do not use CHG on your head or face. If the solution gets into your ears or eyes, rinse them well with water. Avoid your genital area. Avoid any areas of skin that have broken skin, cuts, or scrapes. Scrub your back and under your arms. Make sure to wash skin folds. Let the lather sit on your skin for 1-2 minutes or as long as told by your health care provider. Using a different clean, wet washcloth, thoroughly rinse your entire body. Make sure that all body creases and crevices are rinsed well. Dry off with a clean towel. Do not put any substances on your body afterward--such as powder, lotion, or perfume--unless you are told to do so by your health care provider. Only use lotions that are recommended by the manufacturer. Put on clean clothes or pajamas. If it is the night before your surgery, sleep in clean sheets. How to use CHG prepackaged cloths Only use CHG cloths as told by your health care provider, and follow the instructions on the label. Use the CHG cloth on clean, dry skin. Do not use the CHG cloth on your head  or face unless your health care provider tells you to. When washing with the CHG cloth: Avoid your genital  area. Avoid any areas of skin that have broken skin, cuts, or scrapes. Before surgery Follow these steps when using a CHG cloth to clean before surgery (unless your health care provider gives you different instructions): Using the CHG cloth, vigorously scrub the part of your body where you will be having surgery. Scrub using a back-and-forth motion for 3 minutes. The area on your body should be completely wet with CHG when you are done scrubbing. Do not rinse. Discard the cloth and let the area air-dry. Do not put any substances on the area afterward, such as powder, lotion, or perfume. Put on clean clothes or pajamas. If it is the night before your surgery, sleep in clean sheets.  For general bathing Follow these steps when using CHG cloths for general bathing (unless your health care provider gives you different instructions). Use a separate CHG cloth for each area of your body. Make sure you wash between any folds of skin and between your fingers and toes. Wash your body in the following order, switching to a new cloth after each step: The front of your neck, shoulders, and chest. Both of your arms, under your arms, and your hands. Your stomach and groin area, avoiding the genitals. Your right leg and foot. Your left leg and foot. The back of your neck, your back, and your buttocks. Do not rinse. Discard the cloth and let the area air-dry. Do not put any substances on your body afterward--such as powder, lotion, or perfume--unless you are told to do so by your health care provider. Only use lotions that are recommended by the manufacturer. Put on clean clothes or pajamas. Contact a health care provider if: Your skin gets irritated after scrubbing. You have questions about using your solution or cloth. You swallow any chlorhexidine. Call your local poison control center (1-709 871 5082 in the [place] Get help right away if: Your eyes itch badly, or they become very red or swollen. Your  skin itches badly and is red or swollen. Your hearing changes. You have trouble seeing. You have swelling or tingling in your mouth or throat. You have trouble breathing. These symptoms may represent a serious problem that is an emergency. Do not wait to see if the symptoms will go away. Get medical help right away. Call your local emergency services (911 in the [place] Do not drive yourself to the hospital. Summary Chlorhexidine gluconate (CHG) is a germ-killing (antiseptic) solution that is used to clean the skin. Cleaning your skin with CHG may help to lower your risk for infection. You may be given CHG to use for bathing. It may be in a bottle or in a prepackaged cloth to use on your skin. Carefully follow your health care provider's instructions and the instructions on the product label. Do not use CHG if you have a chlorhexidine allergy. Contact your health care provider if your skin gets irritated after scrubbing. This information is not intended to replace advice given to you by your health care provider. Make sure you discuss any questions you have with your health care provider. Document Revised: 08/18/2020 Document Reviewed: 08/18/2020 Elsevier Patient Education  2022 [employer].

## 2021-03-19 ENCOUNTER — Other Ambulatory Visit: Payer: Self-pay

## 2021-03-19 ENCOUNTER — Encounter (HOSPITAL_COMMUNITY)
Admission: RE | Admit: 2021-03-19 | Discharge: 2021-03-19 | Disposition: A | Discharge status: Home / Self Care | Payer: Medicaid Other | Source: Ambulatory Visit | Attending: General Surgery | Admitting: General Surgery

## 2021-03-19 ENCOUNTER — Encounter (HOSPITAL_COMMUNITY): Payer: Self-pay

## 2021-03-19 DIAGNOSIS — Z01810 Encounter for preprocedural cardiovascular examination: Secondary | ICD-10-CM | POA: Insufficient documentation

## 2021-03-19 HISTORY — DX: Gastro-esophageal reflux disease without esophagitis: K21.9

## 2021-03-23 ENCOUNTER — Ambulatory Visit (HOSPITAL_COMMUNITY)
Admission: RE | Admit: 2021-03-23 | Discharge: 2021-03-23 | Disposition: A | Discharge status: Home / Self Care | Payer: Medicare HMO | Source: Ambulatory Visit | Attending: General Surgery | Admitting: General Surgery

## 2021-03-23 ENCOUNTER — Ambulatory Visit (HOSPITAL_COMMUNITY): Payer: Medicare HMO | Admitting: Anesthesiology

## 2021-03-23 ENCOUNTER — Observation Stay (HOSPITAL_COMMUNITY)
Admission: RE | Admit: 2021-03-23 | Discharge: 2021-03-24 | Disposition: A | Discharge status: Home / Self Care | Payer: Medicare HMO | Attending: General Surgery | Admitting: General Surgery

## 2021-03-23 ENCOUNTER — Encounter (HOSPITAL_COMMUNITY): Payer: Self-pay | Admitting: General Surgery

## 2021-03-23 ENCOUNTER — Encounter (HOSPITAL_COMMUNITY)
Admission: RE | Disposition: A | Discharge status: Home / Self Care | Payer: Self-pay | Source: Home / Self Care | Attending: General Surgery

## 2021-03-23 ENCOUNTER — Other Ambulatory Visit: Payer: Self-pay

## 2021-03-23 DIAGNOSIS — Z79899 Other long term (current) drug therapy: Secondary | ICD-10-CM | POA: Insufficient documentation

## 2021-03-23 DIAGNOSIS — I10 Essential (primary) hypertension: Secondary | ICD-10-CM | POA: Diagnosis not present

## 2021-03-23 DIAGNOSIS — Z20822 Contact with and (suspected) exposure to covid-19: Secondary | ICD-10-CM | POA: Diagnosis not present

## 2021-03-23 DIAGNOSIS — D0512 Intraductal carcinoma in situ of left breast: Secondary | ICD-10-CM | POA: Diagnosis not present

## 2021-03-23 DIAGNOSIS — R2689 Other abnormalities of gait and mobility: Secondary | ICD-10-CM | POA: Insufficient documentation

## 2021-03-23 DIAGNOSIS — F17210 Nicotine dependence, cigarettes, uncomplicated: Secondary | ICD-10-CM | POA: Diagnosis not present

## 2021-03-23 DIAGNOSIS — N6323 Unspecified lump in the left breast, lower outer quadrant: Secondary | ICD-10-CM

## 2021-03-23 DIAGNOSIS — C50912 Malignant neoplasm of unspecified site of left female breast: Secondary | ICD-10-CM | POA: Diagnosis not present

## 2021-03-23 DIAGNOSIS — R69 Illness, unspecified: Secondary | ICD-10-CM | POA: Diagnosis not present

## 2021-03-23 DIAGNOSIS — Z853 Personal history of malignant neoplasm of breast: Secondary | ICD-10-CM

## 2021-03-23 DIAGNOSIS — N6320 Unspecified lump in the left breast, unspecified quadrant: Secondary | ICD-10-CM | POA: Diagnosis present

## 2021-03-23 HISTORY — PX: MASTECTOMY W/ SENTINEL NODE BIOPSY: SHX2001

## 2021-03-23 HISTORY — PX: MASTECTOMY: SHX3

## 2021-03-23 LAB — SARS CORONAVIRUS 2 BY RT PCR (HOSPITAL ORDER, PERFORMED IN ~~LOC~~ HOSPITAL LAB): SARS Coronavirus 2: NEGATIVE

## 2021-03-23 SURGERY — MASTECTOMY WITH SENTINEL LYMPH NODE BIOPSY
Anesthesia: General | Site: Breast | Laterality: Left

## 2021-03-23 MED ORDER — LACTATED RINGERS IV SOLN
INTRAVENOUS | Status: DC
  Administered 2021-03-23: 1000 mL via INTRAVENOUS

## 2021-03-23 MED ORDER — ADULT MULTIVITAMIN W/MINERALS CH
1.0000 | ORAL_TABLET | Freq: Every day | ORAL | Status: DC
  Administered 2021-03-23 – 2021-03-24 (×2): 1 via ORAL
  Filled 2021-03-23 (×2): qty 1

## 2021-03-23 MED ORDER — LORAZEPAM 1 MG PO TABS: 1.0000 mg | ORAL_TABLET | ORAL | Status: DC | PRN

## 2021-03-23 MED ORDER — ONDANSETRON HCL 4 MG/2ML IJ SOLN
INTRAMUSCULAR | Status: DC | PRN
  Administered 2021-03-23: 4 mg via INTRAVENOUS

## 2021-03-23 MED ORDER — THIAMINE HCL 100 MG PO TABS
100.0000 mg | ORAL_TABLET | Freq: Every day | ORAL | Status: DC
  Administered 2021-03-23 – 2021-03-24 (×2): 100 mg via ORAL
  Filled 2021-03-23 (×2): qty 1

## 2021-03-23 MED ORDER — HYDROXYZINE HCL 25 MG PO TABS: 25.0000 mg | ORAL_TABLET | Freq: Every evening | ORAL | Status: DC | PRN

## 2021-03-23 MED ORDER — BUPIVACAINE LIPOSOME 1.3 % IJ SUSP
INTRAMUSCULAR | Status: AC
  Filled 2021-03-23: qty 20

## 2021-03-23 MED ORDER — ONDANSETRON 4 MG PO TBDP: 4.0000 mg | ORAL_TABLET | Freq: Four times a day (QID) | ORAL | Status: DC | PRN

## 2021-03-23 MED ORDER — PENTAFLUOROPROP-TETRAFLUOROETH EX AERO
INHALATION_SPRAY | CUTANEOUS | Status: AC
  Filled 2021-03-23: qty 116

## 2021-03-23 MED ORDER — LISINOPRIL 10 MG PO TABS
10.0000 mg | ORAL_TABLET | Freq: Every day | ORAL | Status: DC
  Filled 2021-03-23: qty 1

## 2021-03-23 MED ORDER — ORAL CARE MOUTH RINSE: 15.0000 mL | Freq: Once | OROMUCOSAL | Status: AC

## 2021-03-23 MED ORDER — BUPIVACAINE LIPOSOME 1.3 % IJ SUSP
INTRAMUSCULAR | Status: DC | PRN
  Administered 2021-03-23: 20 mL

## 2021-03-23 MED ORDER — ACETAMINOPHEN 500 MG PO TABS
1000.0000 mg | ORAL_TABLET | Freq: Four times a day (QID) | ORAL | Status: DC
  Administered 2021-03-23 – 2021-03-24 (×3): 1000 mg via ORAL
  Filled 2021-03-23 (×3): qty 2

## 2021-03-23 MED ORDER — DOCUSATE SODIUM 100 MG PO CAPS
100.0000 mg | ORAL_CAPSULE | Freq: Two times a day (BID) | ORAL | Status: DC
  Administered 2021-03-23 – 2021-03-24 (×3): 100 mg via ORAL
  Filled 2021-03-23 (×3): qty 1

## 2021-03-23 MED ORDER — ONDANSETRON HCL 4 MG/2ML IJ SOLN: 4.0000 mg | Freq: Once | INTRAMUSCULAR | Status: DC | PRN

## 2021-03-23 MED ORDER — CHLORHEXIDINE GLUCONATE CLOTH 2 % EX PADS: 6.0000 | MEDICATED_PAD | Freq: Once | CUTANEOUS | Status: DC

## 2021-03-23 MED ORDER — PROPOFOL 10 MG/ML IV BOLUS
INTRAVENOUS | Status: AC
  Filled 2021-03-23: qty 20

## 2021-03-23 MED ORDER — ONDANSETRON HCL 4 MG/2ML IJ SOLN
INTRAMUSCULAR | Status: AC
  Filled 2021-03-23: qty 2

## 2021-03-23 MED ORDER — TECHNETIUM TC 99M TILMANOCEPT KIT
1.0000 | PACK | Freq: Once | INTRAVENOUS | Status: AC | PRN
  Administered 2021-03-23: 1 via INTRADERMAL

## 2021-03-23 MED ORDER — PHENYLEPHRINE 40 MCG/ML (10ML) SYRINGE FOR IV PUSH (FOR BLOOD PRESSURE SUPPORT)
PREFILLED_SYRINGE | INTRAVENOUS | Status: AC
  Filled 2021-03-23: qty 10

## 2021-03-23 MED ORDER — ATORVASTATIN CALCIUM 20 MG PO TABS
20.0000 mg | ORAL_TABLET | Freq: Every morning | ORAL | Status: DC
  Administered 2021-03-24: 20 mg via ORAL
  Filled 2021-03-23 (×2): qty 1

## 2021-03-23 MED ORDER — PHENYLEPHRINE HCL (PRESSORS) 10 MG/ML IV SOLN
INTRAVENOUS | Status: DC | PRN
  Administered 2021-03-23 (×2): 40 ug via INTRAVENOUS

## 2021-03-23 MED ORDER — OXYCODONE HCL 5 MG PO TABS
5.0000 mg | ORAL_TABLET | ORAL | Status: DC | PRN
Start: 2021-03-23 — End: 2021-03-24
  Administered 2021-03-23: 5 mg via ORAL
  Administered 2021-03-23 – 2021-03-24 (×2): 10 mg via ORAL
  Administered 2021-03-24: 5 mg via ORAL
  Filled 2021-03-23: qty 1
  Filled 2021-03-23 (×3): qty 2

## 2021-03-23 MED ORDER — CEFAZOLIN SODIUM-DEXTROSE 2-4 GM/100ML-% IV SOLN
INTRAVENOUS | Status: AC
  Filled 2021-03-23: qty 100

## 2021-03-23 MED ORDER — CHLORHEXIDINE GLUCONATE 0.12 % MT SOLN
15.0000 mL | Freq: Once | OROMUCOSAL | Status: AC
  Administered 2021-03-23: 15 mL via OROMUCOSAL

## 2021-03-23 MED ORDER — LIDOCAINE HCL (PF) 2 % IJ SOLN
INTRAMUSCULAR | Status: AC
  Filled 2021-03-23: qty 5

## 2021-03-23 MED ORDER — METOPROLOL TARTRATE 5 MG/5ML IV SOLN
5.0000 mg | Freq: Four times a day (QID) | INTRAVENOUS | Status: DC | PRN
Start: 2021-03-23 — End: 2021-03-24

## 2021-03-23 MED ORDER — FOLIC ACID 1 MG PO TABS
1.0000 mg | ORAL_TABLET | Freq: Every day | ORAL | Status: DC
  Administered 2021-03-23 – 2021-03-24 (×2): 1 mg via ORAL
  Filled 2021-03-23 (×2): qty 1

## 2021-03-23 MED ORDER — PANTOPRAZOLE SODIUM 40 MG PO TBEC
40.0000 mg | DELAYED_RELEASE_TABLET | Freq: Every day | ORAL | Status: DC
  Administered 2021-03-23 – 2021-03-24 (×2): 40 mg via ORAL
  Filled 2021-03-23 (×2): qty 1

## 2021-03-23 MED ORDER — LIDOCAINE HCL (CARDIAC) PF 50 MG/5ML IV SOSY
PREFILLED_SYRINGE | INTRAVENOUS | Status: DC | PRN
  Administered 2021-03-23: 50 mg via INTRAVENOUS

## 2021-03-23 MED ORDER — ROCURONIUM BROMIDE 10 MG/ML (PF) SYRINGE
PREFILLED_SYRINGE | INTRAVENOUS | Status: AC
  Filled 2021-03-23: qty 10

## 2021-03-23 MED ORDER — DIPHENHYDRAMINE HCL 50 MG/ML IJ SOLN: 12.5000 mg | Freq: Four times a day (QID) | INTRAMUSCULAR | Status: DC | PRN

## 2021-03-23 MED ORDER — MORPHINE SULFATE (PF) 2 MG/ML IV SOLN: 2.0000 mg | INTRAVENOUS | Status: DC | PRN

## 2021-03-23 MED ORDER — MIDAZOLAM HCL 2 MG/2ML IJ SOLN
INTRAMUSCULAR | Status: DC | PRN
  Administered 2021-03-23: 2 mg via INTRAVENOUS

## 2021-03-23 MED ORDER — FENTANYL CITRATE PF 50 MCG/ML IJ SOSY
25.0000 ug | PREFILLED_SYRINGE | INTRAMUSCULAR | Status: DC | PRN
  Administered 2021-03-23 (×3): 50 ug via INTRAVENOUS
  Filled 2021-03-23 (×3): qty 1

## 2021-03-23 MED ORDER — FENTANYL CITRATE (PF) 100 MCG/2ML IJ SOLN
INTRAMUSCULAR | Status: AC
  Filled 2021-03-23: qty 2

## 2021-03-23 MED ORDER — FENTANYL CITRATE (PF) 100 MCG/2ML IJ SOLN
INTRAMUSCULAR | Status: DC | PRN
  Administered 2021-03-23: 50 ug via INTRAVENOUS
  Administered 2021-03-23 (×2): 25 ug via INTRAVENOUS
  Administered 2021-03-23 (×2): 50 ug via INTRAVENOUS

## 2021-03-23 MED ORDER — DIPHENHYDRAMINE HCL 12.5 MG/5ML PO ELIX: 12.5000 mg | ORAL_SOLUTION | Freq: Four times a day (QID) | ORAL | Status: DC | PRN

## 2021-03-23 MED ORDER — SUGAMMADEX SODIUM 500 MG/5ML IV SOLN
INTRAVENOUS | Status: DC | PRN
  Administered 2021-03-23: 100 mg via INTRAVENOUS

## 2021-03-23 MED ORDER — 0.9 % SODIUM CHLORIDE (POUR BTL) OPTIME
TOPICAL | Status: DC | PRN
  Administered 2021-03-23: 1000 mL

## 2021-03-23 MED ORDER — MIDAZOLAM HCL 2 MG/2ML IJ SOLN
INTRAMUSCULAR | Status: AC
  Filled 2021-03-23: qty 2

## 2021-03-23 MED ORDER — SODIUM CHLORIDE (PF) 0.9 % IJ SOLN
INTRAMUSCULAR | Status: AC
  Filled 2021-03-23: qty 10

## 2021-03-23 MED ORDER — METHOCARBAMOL 500 MG PO TABS: 500.0000 mg | ORAL_TABLET | Freq: Four times a day (QID) | ORAL | Status: DC | PRN

## 2021-03-23 MED ORDER — METHYLENE BLUE 0.5 % INJ SOLN
INTRAVENOUS | Status: AC
  Filled 2021-03-23: qty 10

## 2021-03-23 MED ORDER — SIMETHICONE 80 MG PO CHEW: 40.0000 mg | CHEWABLE_TABLET | Freq: Four times a day (QID) | ORAL | Status: DC | PRN

## 2021-03-23 MED ORDER — DEXAMETHASONE SODIUM PHOSPHATE 10 MG/ML IJ SOLN
INTRAMUSCULAR | Status: DC | PRN
  Administered 2021-03-23: 5 mg via INTRAVENOUS

## 2021-03-23 MED ORDER — IBUPROFEN 600 MG PO TABS: 600.0000 mg | ORAL_TABLET | Freq: Four times a day (QID) | ORAL | Status: DC | PRN

## 2021-03-23 MED ORDER — HYDROMORPHONE HCL 1 MG/ML IJ SOLN
INTRAMUSCULAR | Status: AC
  Filled 2021-03-23: qty 0.5

## 2021-03-23 MED ORDER — LORAZEPAM 2 MG/ML IJ SOLN: 1.0000 mg | INTRAMUSCULAR | Status: DC | PRN

## 2021-03-23 MED ORDER — THIAMINE HCL 100 MG/ML IJ SOLN
100.0000 mg | Freq: Every day | INTRAMUSCULAR | Status: DC
  Filled 2021-03-23: qty 2

## 2021-03-23 MED ORDER — PROPOFOL 10 MG/ML IV BOLUS
INTRAVENOUS | Status: DC | PRN
  Administered 2021-03-23: 40 mg via INTRAVENOUS
  Administered 2021-03-23: 140 mg via INTRAVENOUS
  Administered 2021-03-23: 20 mg via INTRAVENOUS

## 2021-03-23 MED ORDER — CEFAZOLIN SODIUM-DEXTROSE 2-4 GM/100ML-% IV SOLN
2.0000 g | INTRAVENOUS | Status: AC
  Administered 2021-03-23: 2 g via INTRAVENOUS

## 2021-03-23 MED ORDER — FENTANYL CITRATE (PF) 250 MCG/5ML IJ SOLN
INTRAMUSCULAR | Status: AC
  Filled 2021-03-23: qty 5

## 2021-03-23 MED ORDER — ZOLPIDEM TARTRATE 5 MG PO TABS
5.0000 mg | ORAL_TABLET | Freq: Every evening | ORAL | Status: DC | PRN
  Administered 2021-03-23: 5 mg via ORAL
  Filled 2021-03-23: qty 1

## 2021-03-23 MED ORDER — ONDANSETRON HCL 4 MG/2ML IJ SOLN: 4.0000 mg | Freq: Four times a day (QID) | INTRAMUSCULAR | Status: DC | PRN

## 2021-03-23 MED ORDER — HYDROMORPHONE HCL 1 MG/ML IJ SOLN
0.2500 mg | INTRAMUSCULAR | Status: DC | PRN
  Administered 2021-03-23 (×3): 0.5 mg via INTRAVENOUS
  Filled 2021-03-23 (×2): qty 0.5

## 2021-03-23 MED ORDER — ROCURONIUM BROMIDE 100 MG/10ML IV SOLN
INTRAVENOUS | Status: DC | PRN
  Administered 2021-03-23: 30 mg via INTRAVENOUS

## 2021-03-23 SURGICAL SUPPLY — 49 items
APPLIER CLIP 11 MED OPEN (CLIP) ×2
BINDER BREAST LRG (GAUZE/BANDAGES/DRESSINGS) ×2 IMPLANT
CHLORAPREP W/TINT 26 (MISCELLANEOUS) ×2 IMPLANT
CLIP APPLIE 11 MED OPEN (CLIP) ×1 IMPLANT
CLOTH BEACON ORANGE TIMEOUT ST (SAFETY) ×2 IMPLANT
COVER LIGHT HANDLE STERIS (MISCELLANEOUS) ×4 IMPLANT
COVER PROBE W GEL 5X96 (DRAPES) ×2 IMPLANT
DRAPE HALF SHEET 40X57 (DRAPES) IMPLANT
ELECT REM PT RETURN 9FT ADLT (ELECTROSURGICAL) ×2
ELECTRODE REM PT RTRN 9FT ADLT (ELECTROSURGICAL) ×1 IMPLANT
EVACUATOR DRAINAGE 10X20 100CC (DRAIN) ×1 IMPLANT
EVACUATOR SILICONE 100CC (DRAIN) ×1
GAUZE SPONGE 4X4 12PLY STRL (GAUZE/BANDAGES/DRESSINGS) ×2 IMPLANT
GLOVE SURG ENC MOIS LTX SZ6.5 (GLOVE) ×2 IMPLANT
GLOVE SURG POLYISO LF SZ7.5 (GLOVE) ×2 IMPLANT
GLOVE SURG UNDER POLY LF SZ6.5 (GLOVE) ×2 IMPLANT
GLOVE SURG UNDER POLY LF SZ7 (GLOVE) ×2 IMPLANT
GOWN STRL REUS W/TWL LRG LVL3 (GOWN DISPOSABLE) ×6 IMPLANT
INST SET MINOR GENERAL (KITS) ×2 IMPLANT
KIT TURNOVER KIT A (KITS) ×2 IMPLANT
MANIFOLD NEPTUNE II (INSTRUMENTS) ×2 IMPLANT
NEEDLE HYPO 18GX1.5 BLUNT FILL (NEEDLE) ×2 IMPLANT
NEEDLE HYPO 21X1.5 SAFETY (NEEDLE) ×2 IMPLANT
NEEDLE HYPO 25X1 1.5 SAFETY (NEEDLE) ×2 IMPLANT
NS IRRIG 1000ML POUR BTL (IV SOLUTION) ×2 IMPLANT
PACK MINOR (CUSTOM PROCEDURE TRAY) ×2 IMPLANT
PAD ABD 5X9 TENDERSORB (GAUZE/BANDAGES/DRESSINGS) ×2 IMPLANT
PAD ABD 8X10 STRL (GAUZE/BANDAGES/DRESSINGS) ×2 IMPLANT
PAD ARMBOARD 7.5X6 YLW CONV (MISCELLANEOUS) ×2 IMPLANT
PENCIL SMOKE EVACUATOR (MISCELLANEOUS) ×2 IMPLANT
SET BASIN LINEN APH (SET/KITS/TRAYS/PACK) ×2 IMPLANT
SPONGE DRAIN TRACH 4X4 STRL 2S (GAUZE/BANDAGES/DRESSINGS) ×2 IMPLANT
SPONGE GAUZE 4X4 12PLY STER LF (GAUZE/BANDAGES/DRESSINGS) ×2 IMPLANT
SPONGE INTESTINAL PEANUT (DISPOSABLE) ×2 IMPLANT
SPONGE T-LAP 18X18 ~~LOC~~+RFID (SPONGE) ×4 IMPLANT
STAPLER VISISTAT (STAPLE) ×4 IMPLANT
SUT ETHILON 3 0 FSL (SUTURE) ×2 IMPLANT
SUT SILK 2 0 (SUTURE) ×1
SUT SILK 2 0 SH (SUTURE) ×2 IMPLANT
SUT SILK 2-0 18XBRD TIE 12 (SUTURE) ×1 IMPLANT
SUT VIC AB 2-0 CT1 27 (SUTURE) ×7
SUT VIC AB 2-0 CT1 TAPERPNT 27 (SUTURE) ×7 IMPLANT
SUT VIC AB 3-0 SH 27 (SUTURE)
SUT VIC AB 3-0 SH 27X BRD (SUTURE) IMPLANT
SUT VICRYL AB 2 0 TIES (SUTURE) IMPLANT
SYR 20ML LL LF (SYRINGE) ×4 IMPLANT
SYR BULB IRRIG 60ML STRL (SYRINGE) ×2 IMPLANT
SYR CONTROL 10ML LL (SYRINGE) ×2 IMPLANT
TAPE CLOTH SURG 4X10 WHT LF (GAUZE/BANDAGES/DRESSINGS) ×2 IMPLANT

## 2021-03-23 NOTE — Op Note (Signed)
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 Constance Haw, MD   Assistants: Aviva Signs, MD     Anesthesia: General endotracheal   Anesthesiologist: Louann Sjogren, MD    Specimens: Left breast with suture superior, left axillary sentinel nodes (2 hot blue nodes, 1 associated node)    Estimated Blood Loss: Minimal   Blood Replacement: None    Complications: None   Wound Class: Clean    Operative Indications: Ms. Maudlin is a 65 yo with a large left breast mass with findings of ductal carcinoma high grade with discordant findings based on imaging per the radiologist. We discussed her options and she opted for total mastectomy with sentinel node give the possibility of an invasive cancer and the fact she did not want to pursue radiation. We discussed the risk of bleeding, infection, lymphedema, and JP drain placement.   Findings: Large left breast mass, 2 hot blue nodes, 1 associated normal node    Procedure: Radiotracer was placed in the preoperative area. The patient was taken to the operating room and placed supine. General endotracheal anesthesia was induced. Intravenous antibiotics were administered per protocol.  A JACHO approved time out was performed. Blue dye was placed around the areola subdermally.  This was massaged for 5 minutes. The left breast and axilla was prepped and draped in the normal sterile fashion.   An incision was made to encompass the nipple-areola complex in a transverse direction across the breast.  Flaps were raised in the avascular plane and between the subcutaneous tissue and breast tissue to the clavicle superiorly, the sternum medially, the anterior rectus sheath inferiorly, and past the  lateral border of the pectoralis major muscle laterally. Hemostasis was achieved in the flaps, next the breast tissue and underlying pectoralis fascia were excised from the pectoralis major muscle progressing medially to laterally. At the lateral border of the pectoralis major muscle, the breast tissue was swung laterally and a lateral pedicle identified where the breast tissue gave way to fat of the axilla. The lateral pedicle was incised and the specimen was removed.   The handheld gamma probe was used to identify the hottest spot in the axilla where an obvious blue node was identified. The probe was placed in contact with the node and the counts were 6148. The node was excised in its entirety with sharp dissection and clips and ex vivo the counts were 10715.  The bed counts were noted to be 300 and a second hot blue node was identified. The in vivo counts were 8425 and the ex vivo counts once it was completely excised was 13600.  It was excised with sharp dissection and clips. An associated normal appearing, non blue node was excised in addition to the 2 hot nodes as it was in the chain and associated with the nodes. The bed counts were < 20 after this was removed. No clinically abnormal nodes were palpated.   Hemostasis of the axilla and mastectomy site was achieved. Irrigation was performed. Exparel was injected. A JP drain was placed and a 3-0 nylon was used to secure it.  The mastectomy site was closed with dermal 2-0 Vicryl interrupted sutures. The skin was closed with staples.   Dr. Arnoldo Morale was assisting throughout the procedure and  was present for the critical portions of the case.   All counts were correct at the end of the case. The patient was awakened from anesthesia and extubated without complication.  The patient went to the PACU in stable condition.   [name], MD [facility] [address] [place], [place] [address] [phone] (office)

## 2021-03-23 NOTE — Progress Notes (Signed)
Awake. Needs to void. Ambulated to BR. Voided without difficulty. Returned to inpt bed. Tolerated well. Walks well with assistance and cane.

## 2021-03-23 NOTE — Anesthesia Preprocedure Evaluation (Signed)
Anesthesia Evaluation  Patient identified by MRN, date of birth, ID band Patient awake    Reviewed: Allergy & Precautions, H&P , NPO status , Patient's Chart, lab work & pertinent test results, reviewed documented beta blocker date and time   Airway Mallampati: II  TM Distance: >3 FB Neck ROM: full    Dental no notable dental hx.    Pulmonary neg pulmonary ROS, Current Smoker and Patient abstained from smoking.,    Pulmonary exam normal breath sounds clear to auscultation       Cardiovascular Exercise Tolerance: Good hypertension, negative cardio ROS   Rhythm:regular Rate:Normal     Neuro/Psych negative neurological ROS  negative psych ROS   GI/Hepatic Neg liver ROS, GERD  Medicated,  Endo/Other  negative endocrine ROS  Renal/GU negative Renal ROS  negative genitourinary   Musculoskeletal   Abdominal   Peds  Hematology negative hematology ROS (+)   Anesthesia Other Findings   Reproductive/Obstetrics negative OB ROS                             Anesthesia Physical Anesthesia Plan  ASA: 2  Anesthesia Plan: General and General ETT   Post-op Pain Management:    Induction:   PONV Risk Score and Plan: Ondansetron  Airway Management Planned:   Additional Equipment:   Intra-op Plan:   Post-operative Plan:   Informed Consent: I have reviewed the patients History and Physical, chart, labs and discussed the procedure including the risks, benefits and alternatives for the proposed anesthesia with the patient or authorized representative who has indicated his/her understanding and acceptance.     Dental Advisory Given  Plan Discussed with: CRNA  Anesthesia Plan Comments:         Anesthesia Quick Evaluation

## 2021-03-23 NOTE — Anesthesia Postprocedure Evaluation (Signed)
Anesthesia Post Note  Patient: Samantha Mcfarland  Procedure(s) Performed: TOTAL MASTECTOMY WITH SENTINEL LYMPH NODE BIOPSY (Left: Breast)  Patient location during evaluation: Phase II Anesthesia Type: General Level of consciousness: awake Pain management: pain level controlled Vital Signs Assessment: post-procedure vital signs reviewed and stable Respiratory status: spontaneous breathing and respiratory function stable Cardiovascular status: blood pressure returned to baseline and stable Postop Assessment: no headache and no apparent nausea or vomiting Anesthetic complications: no Comments: Late entry   No notable events documented.   Last Vitals:  Vitals:   03/23/21 1300 03/23/21 1315  BP: 99/64 98/67  Pulse: 68 63  Resp: 13 13  Temp:    SpO2: 91% 92%    Last Pain:  Vitals:   03/23/21 1215  TempSrc:   PainSc: [place]

## 2021-03-23 NOTE — Progress Notes (Signed)
Continue waiting on room assignment. Talked with Nicholaus Corolla, pt's sister. Pt update given. Voiced understanding. Cell phone given to pt.

## 2021-03-23 NOTE — Progress Notes (Signed)
[facility]  Left mastectomy and sentinel node completed. JP in place. CIWA protocol PT for post mastectomy exercises Needs COVID test as she will be observation overnight, preop did not order   [name], MD [facility] [address] [place], [place] [address] [phone] (office)

## 2021-03-23 NOTE — Progress Notes (Signed)
Sitting up in bed eating sandwich and chips. Tolerated well.

## 2021-03-23 NOTE — Progress Notes (Signed)
Sentinel node injection completed

## 2021-03-23 NOTE — Transfer of Care (Signed)
Immediate Anesthesia Transfer of Care Note  Patient: Samantha Mcfarland  Procedure(s) Performed: MASTECTOMY WITH SENTINEL LYMPH NODE BIOPSY (Left: Breast)  Patient Location: PACU  Anesthesia Type:General  Level of Consciousness: awake and patient cooperative  Airway & Oxygen Therapy: Patient Spontanous Breathing  Post-op Assessment: Report given to RN and Post -op Vital signs reviewed and stable  Post vital signs: Reviewed and stable  Last Vitals:  Vitals Value Taken Time  BP    Temp 97.7   Pulse 67 03/23/21 1116  Resp 8 03/23/21 1116  SpO2 99 % 03/23/21 1116  Vitals shown include unvalidated device data.  Last Pain:  Vitals:   03/23/21 0806  TempSrc: Oral  PainSc: 0-No pain      Patients Stated Pain Goal: 7 (03/83/33 8329)  Complications: No notable events documented.

## 2021-03-23 NOTE — Anesthesia Procedure Notes (Addendum)
Procedure Name: Intubation Date/Time: 03/23/2021 9:58 AM Performed by: Vista Deck, CRNA Pre-anesthesia Checklist: Patient identified, Patient being monitored, Timeout performed, Emergency Drugs available and Suction available Patient Re-evaluated:Patient Re-evaluated prior to induction Oxygen Delivery Method: Circle System Utilized Preoxygenation: Pre-oxygenation with 100% oxygen Induction Type: IV induction Ventilation: Mask ventilation without difficulty Laryngoscope Size: Mac and 3 Grade View: Grade II Tube type: Oral Tube size: 7.0 mm Number of attempts: 1 Airway Equipment and Method: stylet Placement Confirmation: ETT inserted through vocal cords under direct vision, positive ETCO2 and breath sounds checked- equal and bilateral Secured at: 20 cm Tube secured with: Tape Dental Injury: Teeth and Oropharynx as per pre-operative assessment

## 2021-03-23 NOTE — Discharge Instructions (Addendum)
Discharge instructions after breast surgery:   Common Complaints: Pain and bruising at the incision sites.  Swelling at the incision sites. Stiffness of the arm.   Diet/ Activity: Diet as tolerated.  You may shower but do not take hot showers as this can disrupt the glue. Rest and listen to your body, but do not remain in bed all day.  Walk everyday for at least 15-20 minutes. Deep cough and move around every 1-2 hours in the first few days after surgery.  Do not lift > 10 lbs for the first 2 weeks after surgery. Do not do anything that makes you feel like you are putting unnecessary pull or stretch on the incision sites.  Do move your arm and shoulder (see exercises options below). If you do not move then you can get stiff and hurt more.  Do not pick at the dermabond glue on your incision sites.  This glue film will remain in place for 1-2 weeks and will start to peel off.  Do not place lotions or balms on your incision unless instructed to specifically by Dr. Constance Haw.   Pain Expectations and Narcotics: -After surgery you will have pain associated with your incisions and this is normal. The pain is muscular and nerve pain, and will get better with time. -You are encouraged and expected to take non narcotic medications like tylenol and ibuprofen (when able) to treat pain as multiple modalities can aid with pain treatment. -Narcotics are only used when pain is severe or there is breakthrough pain. -You are not expected to have a pain score of 0 after surgery, as we cannot prevent pain. A pain score of 3-4 that allows you to be functional, move, walk, and tolerate some activity is the goal. The pain will continue to improve over the days after surgery and is dependent on your surgery. -Due to [place] law, we are only able to give a certain amount of pain medication to treat post operative pain, and we only give additional narcotics on a patient by patient basis.  -For most laparoscopic surgery,  studies have shown that the majority of patients only need 10-15 narcotic pills, and for open surgeries most patients only need 15-20.   -Having appropriate expectations of pain and knowledge of pain management with non narcotics is important as we do not want anyone to become addicted to narcotic pain medication.  -Using ice packs in the first 48 hours and heating pads after 48 hours, wearing an abdominal binder (when recommended), and using over the counter medications are all ways to help with pain management.   -Simple acts like meditation and mindfulness practices after surgery can also help with pain control and research has proven the benefit of these practices.  Medication: Take tylenol and ibuprofen as needed for pain control, alternating every 4-6 hours.  Example:  Tylenol 1000mg  @ 6am, 12noon, 6pm, 59midnight (Do not exceed 4000mg  of tylenol a day). Ibuprofen 800mg  @ 9am, 3pm, 9pm, 3am (Do not exceed 3600mg  of ibuprofen a day).  Take Roxicodone for breakthrough pain every 4 hours.  Do not drink alcohol with narcotic pain medication.  Take Colace for constipation related to narcotic pain medication. If you do not have a bowel movement in 2 days, take Miralax over the counter.  Drink plenty of water to also prevent constipation.   Contact Information: If you have questions or concerns, please call our office, [phone], Monday- Thursday 8AM-5PM and Friday 8AM-12Noon.  If it is after hours or on  the weekend, please call [facility], [phone], [phone], and ask to speak to the surgeon on call for Dr. Constance Haw at [facility].   [place] Please keep the drain clean and dry. Replace the gauze/ tape around the drain if it gets dirty or wet/ saturated. Please do not mess with or cut the stitch that is keeping the drain in place. Secure the drain to your clothes so that it does not get dislodged.  You may want to wear a binder around your chest at night for sleeping if  you are worried about it getting pulled out.  Please record the output from the drain daily including the color and the amount in milliliters.  Please keep the drain covered with plastic and tape when you shower so that it does not get wet.    Follow PT exercises- Here are some additional exercises:  Exercises After Breast Surgery Do at least a few of the exercises below twice a day. It is ok to start the day after surgery and gradually build up the amount and type of exercises you do. Link to the exercises with pictures ([email or web address]).   Deep Breathing Exercise Deep breathing can help you relax and ease discomfort and tightness around your incision (surgical cut). It's also a very good way to relieve stress during the day.  Sit comfortably in a chair. Take a slow, deep breath through your nose. Let your chest and belly expand. Breathe out slowly through your mouth. Repeat as many times as needed.  Arm and Shoulder Exercises Doing arm and shoulder exercises will help you get back your full range of motion on your affected side (the side where you had your surgery). With full range of motion, you'll be able to: Move your arm over your head and out to the side Move your arm behind your neck Move your arm to the middle of your back Do each of the exercises below 5 times a day. Keep doing this until you have a full range of motion again and can use your arm as you did before surgery in all your normal activities. This includes activities at work, at home, and in recreation or sports. If you had limited movement in your arm before surgery, your goal will be to get back as much movement as you had before.  If you get your full range of motion back quickly, keep doing these exercises once a day instead of 5 times a day. This is especially true if you feel any tightness in your chest, shoulder, or under your affected arm. These  exercises can help keep scar tissue from forming in your armpit and shoulder. Scar tissue can limit your arm movements later.  If you still have trouble moving your shoulder 4 weeks after your surgery, tell your surgeon. They'll tell you if you need more rehabilitation, such as physical or occupational therapy.  If you had one of the following surgeries, you can do the following set of exercises on the first day after your surgery, as long as your surgeon tells you it's safe.  Shoulder rolls The shoulder roll is a good exercise to start with because it gently stretches your chest and shoulder muscles.  Stand or sit comfortably with your arms relaxed at your sides. Start with backward shoulder rolls. In a circular motion, bring your shoulders forward, up, backward, and down. Do this 10 times. Switch directions and do 10 forward shoulder rolls. Bring your shoulders backward, up, forward, and  down. Do this 10 times. Try to make the circles as big as you can and move both shoulders at the same time. If you have some tightness across your incision or chest, start with smaller circles and make them bigger as the tightness decreases. The backward direction might feel a little tighter across your chest than the forward direction. This will get better with practice.  Shoulder wings The shoulder wings exercise will help you get back outward movement of your shoulder. You can do this exercise while sitting or standing.  Place your hands on your chest or collarbone. Raise your elbows out to the side, limiting your range of motion as instructed by your healthcare team. Slowly lower your elbows. Do this 10 times. Then, slowly lower your hands. If you feel discomfort while doing this exercise, hold your position and do the deep breathing exercise. If the discomfort passes, raise your elbows a little higher. If it doesn't pass, don't raise your elbows any higher. Finish the exercise raising your elbows only  high enough to feel a gentle stretch and no discomfort.  Arm circles If you had surgery on both breasts, do this exercise with both arms, 1 arm at a time. Don't do this exercise with both arms at the same time. This will put too much pressure on your chest.  Stand with your feet slightly apart for balance. Raise your affected arm out to the side as high as you can, limiting your range of movement as instructed by your healthcare team. Start making slow, backward circles in the air with your arm. Make sure you're moving your arm from your shoulder, not your elbow. Keep your elbow straight. Increase the size of the circles until they're as big as you can comfortably make them, limiting your range of motion as instructed by your healthcare team. If you feel any aching or if your arm is tired, take a break. Keep doing the exercise when you feel better. Do 10 full backward circles. Then, slowly lower your arm to your side. Rest your arm for a moment. Follow steps 1 to 4 again, but this time make slow, forward circles.  W exercise You can do the W exercise while sitting or standing.  Form a "W" with your arms out to the side and palms facing forward (see Figure 4). Try to bring your hands up so they're even with your face. If you can't raise your arms that high, bring them to the highest comfortable position. Make sure to limit your range of motion as instructed by your healthcare team. Pinch your shoulder blades together and downward, as if you're squeezing a pencil between them. If you feel discomfort, stop at that position and do the deep breathing exercise. If the discomfort passes, try to bring your arms back a little further. If it doesn't pass, don't reach any further. Hold the furthest position that doesn't cause discomfort. Squeeze your shoulder blades together and downward for 5 seconds. Slowly bring your arms back down to the starting position. Repeat this movement 10 times.  Back  Climb You can do the back climb stretch while sitting or standing. You'll need a timer or stopwatch.  Place your hands behind your back. Hold the hand on your affected side with your other hand. If you had surgery on both breasts, use the arm that moves most easily to hold the other. Slowly slide your hands up the center of your back as far as you can. If you feel tightness  near your incision, stop at that position and do the deep breathing exercise. If the tightness decreases, try to slide your hands up a little further. If it doesn't decrease, don't slide your hands up any further. Hold the highest position you can for 1 minute. Use your stopwatch or timer to keep track. You should feel a gentle stretch in your shoulder area. After 1 minute, slowly lower your hands.  Hands behind neck You can do the hands behind neck stretch while sitting or standing. You'll need a timer or stopwatch.  Clasp your hands together on your lap or in front of you. Slowly raise your hands toward your head, keeping your elbows together in front of you, not out to the sides. Keep your head level. Don't bend your neck or head forward. Slide your hands over your head until you reach the back of your neck. When you get to this point, spread your elbows out to the sides. Hold this position for 1 minute. Use your stopwatch or timer to keep track. Breathe normally. Don't hold your breath as you stretch your body. If you have some tightness across your incision or chest, hold your position and do the deep breathing exercise. If the tightness decreases, continue with the movement. If the tightness stays the same, reach up and stretch your elbows back as best as you can without causing discomfort. Hold the position you're most comfortable in for 1 minute. Slowly come out of the stretch by bringing your elbows together and sliding your hands over your head. Then, slowly lower your arms.  Forward wall crawls You'll need 2 pieces  of tape for the forward wall crawl exercise.  Stand facing a wall. Your toes should be about 6 inches (15 centimeters) from the wall. Reach as high as you can with your unaffected arm. [place] that point with a piece of tape. This will be the goal for your affected arm. If you had surgery on both breasts, set your goal using the arm that moves most comfortably. Place both hands against the wall at a level that's comfortable. Crawl your fingers up the wall as far as you can, keeping them even with each other.. Try not to look up toward your hands or arch your back. When you get to the point where you feel a good stretch, but not pain, do the deep breathing exercise. Return to the starting position by crawling your fingers back down the wall. Repeat the wall crawl 10 times. Each time you raise your hands, try to crawl a little bit higher. On the 10th crawl, use the other piece of tape to mark the highest point you reached with your affected arm. This will let you to see your progress each time you do this exercise. As you become more flexible, you may need to take a step closer to the wall so you can reach a little higher.   Side wall crawls You'll also need 2 pieces of tape for the side wall crawl exercise.  You shouldn't feel pain while doing this exercise. It's normal to feel some tightness or pulling across the side of your chest. Focus on your breathing until the tightness decreases. Breathe normally throughout this exercise. Don't hold your breath.  Be careful not to turn your body toward the wall while doing this exercise. Make sure only the side of your body faces the wall.  If you had surgery on both breasts, start with step 3.  Stand with your unaffected side  closest to the wall, about 1 foot (30.5 centimeters) away from the wall. Reach as high as you can with your unaffected arm. [place] that point with a piece of tape (see Figure 8). This will be the goal for your affected arm. Turn your  body so your affected side is now closest to the wall. If you had surgery on both breasts, start with either side closest to the wall. Crawl your fingers up the wall as far as you can. When you get to the point where you feel a good stretch, but not pain, do the deep breathing exercise. Return to the starting position by crawling your fingers back down the wall. Repeat this exercise 10 times. On your 10th crawl, use a piece of tape to mark the highest point you reached with your affected arm. This will let you see your progress each time you do the exercise. If you had surgery on both breasts, repeat the exercise with your other arm.  Swelling After your surgery, you may have some swelling or puffiness in your hand or arm on your affected side. This is normal and usually goes away on its own.  If you notice swelling in your hand or arm, follow the tips below to help the swelling go away.  Raise your arm above your head and do hand pumps several times a day. To do hand pumps, slowly open and close your fist 10 times. This will help drain the fluid out of your arm. Don't hold your arm straight up over your head for more than a few minutes. This can cause your arm muscles to get tired. Raise your arm to the side a few times a day for about 20 minutes at a time. To do this, sit or lie down on your back. Rest your arm on a few pillows next to you so it's raised above the level of your heart. If you're able to sleep on your unaffected side, you can place 1 or 2 pillows in front of you and rest your affected arm on them while you sleep. If the swelling doesn't go down within 4 to 6 weeks, call your surgeon or nurse.

## 2021-03-23 NOTE — Interval H&P Note (Signed)
History and Physical Interval Note:  03/23/2021 8:47 AM  Samantha Mcfarland  has presented today for surgery, with the diagnosis of DCIS left breast.  The various methods of treatment have been discussed with the patient and family. After consideration of risks, benefits and other options for treatment, the patient has consented to  Procedure(s): MASTECTOMY WITH SENTINEL LYMPH NODE BIOPSY (Left) as a surgical intervention.  The patient's history has been reviewed, patient examined, no change in status, stable for surgery.  I have reviewed the patient's chart and labs.  Questions were answered to the patient's satisfaction.    Left breast marked.    Virl Cagey

## 2021-03-24 ENCOUNTER — Encounter (HOSPITAL_COMMUNITY): Payer: Self-pay | Admitting: General Surgery

## 2021-03-24 DIAGNOSIS — D0512 Intraductal carcinoma in situ of left breast: Secondary | ICD-10-CM | POA: Diagnosis not present

## 2021-03-24 DIAGNOSIS — I10 Essential (primary) hypertension: Secondary | ICD-10-CM | POA: Diagnosis not present

## 2021-03-24 DIAGNOSIS — R69 Illness, unspecified: Secondary | ICD-10-CM | POA: Diagnosis not present

## 2021-03-24 DIAGNOSIS — R2689 Other abnormalities of gait and mobility: Secondary | ICD-10-CM | POA: Diagnosis not present

## 2021-03-24 DIAGNOSIS — Z79899 Other long term (current) drug therapy: Secondary | ICD-10-CM | POA: Diagnosis not present

## 2021-03-24 DIAGNOSIS — Z20822 Contact with and (suspected) exposure to covid-19: Secondary | ICD-10-CM | POA: Diagnosis not present

## 2021-03-24 LAB — HIV ANTIBODY (ROUTINE TESTING W REFLEX): HIV Screen 4th Generation wRfx: NONREACTIVE

## 2021-03-24 MED ORDER — DOCUSATE SODIUM 100 MG PO CAPS: 100.0000 mg | ORAL_CAPSULE | Freq: Two times a day (BID) | ORAL | 0 refills | Status: DC | PRN

## 2021-03-24 MED ORDER — ONDANSETRON 4 MG PO TBDP: 4.0000 mg | ORAL_TABLET | Freq: Four times a day (QID) | ORAL | 0 refills | Status: DC | PRN

## 2021-03-24 MED ORDER — SODIUM CHLORIDE (PF) 0.9 % IJ SOLN
INTRAVENOUS | Status: DC | PRN
  Administered 2021-03-23: 3 mL via INTRAMUSCULAR

## 2021-03-24 MED ORDER — OXYCODONE HCL 5 MG PO TABS: 5.0000 mg | ORAL_TABLET | ORAL | 0 refills | Status: DC | PRN

## 2021-03-24 NOTE — Discharge Summary (Signed)
Physician Discharge Summary  Patient ID: Samantha Mcfarland MRN: [id number] DOB/AGE: 01-16-1956 65 y.o.  Admit date: 03/23/2021 Discharge date: 03/24/2021  Admission Diagnoses: Left breast mass, DCIS   Discharge Diagnoses:  Principal Problem:   Breast mass, left Active Problems:   Left breast mass   Ductal carcinoma in situ (DCIS) of left breast   Discharged Condition: good  Hospital Course: Samantha Mcfarland is a very sweet lady with a left breast mass that is 5cm and biopsy of DCIS that is concerning and discordant biopsy of the largest part of the mass. She underwent left mastectomy and sentinel node biopsy and stayed overnight for pain control and JP drain teaching as well as physical therapy post mastectomy exercise teaching. She did well post op and had adequate pain control.   Consults:  Physical therapy   Significant Diagnostic Studies: None   Treatments: Left mastectomy and sentinel node biopsy   Discharge Exam: Blood pressure 101/62, pulse 62, temperature 97.9 F (36.6 C), temperature source Oral, resp. rate 20, height 5\' 7"  (1.702 m), weight 58.9 kg, SpO2 97 %. General appearance: alert, cooperative, and no distress Breasts: normal appearance, no masses or tenderness, left mastectomy site with staples, no erythema or drainage, JP drain with SS fluid   Disposition: Discharge disposition: 01-Home or Self Care      Discharge Instructions     Call MD for:  difficulty breathing, headache or visual disturbances   Complete by: As directed    Call MD for:  extreme fatigue   Complete by: As directed    Call MD for:  persistant dizziness or light-headedness   Complete by: As directed    Call MD for:  persistant nausea and vomiting   Complete by: As directed    Call MD for:  redness, tenderness, or signs of infection (pain, swelling, redness, odor or green/yellow discharge around incision site)   Complete by: As directed    Call MD for:  severe uncontrolled pain   Complete by: As  directed    Call MD for:  temperature >100.4   Complete by: As directed    Increase activity slowly   Complete by: As directed       Allergies as of 03/24/2021       Reactions   Penicillins    Allergic as a child. (Rash ???) Does not recall what type of reaction she has to drug.        Medication List     TAKE these medications    acetaminophen 325 MG tablet Commonly known as: TYLENOL Take 325 mg by mouth every 6 (six) hours as needed (for pain/headaches.).   atorvastatin 20 MG tablet Commonly known as: LIPITOR Take 20 mg by mouth in the morning.   docusate sodium 100 MG capsule Commonly known as: COLACE Take 1 capsule (100 mg total) by mouth 2 (two) times daily as needed for mild constipation (while taking narcotic).   ergocalciferol 1.25 MG (50000 UT) capsule Commonly known as: VITAMIN D2 Take 50,000 Units by mouth every Friday.   hydrOXYzine 25 MG tablet Commonly known as: ATARAX/VISTARIL Take 25 mg by mouth at bedtime as needed (insomnia).   lisinopril 10 MG tablet Commonly known as: ZESTRIL Take 10 mg by mouth daily with lunch.   omeprazole 20 MG capsule Commonly known as: PRILOSEC Take 20 mg by mouth daily as needed (indigestion/heartburn).   ondansetron 4 MG disintegrating tablet Commonly known as: ZOFRAN-ODT Take 1 tablet (4 mg total) by mouth every 6 (six)  hours as needed for nausea.   oxyCODONE 5 MG immediate release tablet Commonly known as: Oxy IR/ROXICODONE Take 1 tablet (5 mg total) by mouth every 4 (four) hours as needed for severe pain or breakthrough pain.        Follow-up Information     Virl Cagey, MD Follow up on 03/31/2021.   Specialty: General Surgery Why: jp drain removal left mastectomy Contact information: [address] [place] [facility] [address] [phone]                 Signed: Virl Cagey

## 2021-03-24 NOTE — TOC Initial Note (Signed)
Transition of Care [facility]) - Initial/Assessment Note    Patient Details  Name: Samantha Mcfarland MRN: [id number] Date of Birth: March 26, 1956  [facility]) CM/SW Contact:    Boneta Lucks, RN Phone Number: 03/24/2021, 10:24 AM  Clinical Narrative:     Patient admitted with breast mass. MD consulted TOC for substance abuse. Patient lives home alone, her sister provides transportation, she walks with a cane. She does not feel like she needs any resources. Patient states she does drink daily, atmost 2-3 drinks, she has slowed down considerable on her own. She has tried [employer] and does not feel like she needs any help at this time. TOC encouraged patient to continue to slow down and not drink daily. Patient is discharge home.               Expected Discharge Plan: Home/Self Care Barriers to Discharge: No Barriers Identified   Patient Goals and CMS Choice Patient states their goals for this hospitalization and ongoing recovery are:: to go home. CMS Medicare.gov Compare Post Acute Care list provided to:: Patient Choice offered to / list presented to : Patient  Expected Discharge Plan and Services Expected Discharge Plan: Home/Self Care      Living arrangements for the past 2 months: Apartment Expected Discharge Date: 03/24/21                  Prior Living Arrangements/Services Living arrangements for the past 2 months: Apartment Lives with:: Self Patient language and need for interpreter reviewed:: Yes Do you feel safe going back to the place where you live?: Yes      Need for Family Participation in Patient Care: Yes (Comment) Care giver support system in place?: Yes (comment)   Criminal Activity/Legal Involvement Pertinent to Current Situation/Hospitalization: No - Comment as needed  Activities of Daily Living Home Assistive Devices/Equipment: Cane (specify quad or straight), Walker (specify type) (quad) ADL Screening (condition at time of admission) Patient's  cognitive ability adequate to safely complete daily activities?: Yes Is the patient deaf or have difficulty hearing?: No Does the patient have difficulty seeing, even when wearing glasses/contacts?: Yes Does the patient have difficulty concentrating, remembering, or making decisions?: No Patient able to express need for assistance with ADLs?: Yes Does the patient have difficulty dressing or bathing?: No Independently performs ADLs?: Yes (appropriate for developmental age) Does the patient have difficulty walking or climbing stairs?: No Weakness of Legs: None Weakness of Arms/Hands: Left  Permission Sought/Granted Permission sought to share information with : Case Manager         Permission granted to share info w Relationship: Sister    Emotional Assessment    Affect (typically observed): Pleasant, Accepting Orientation: : Oriented to Self, Oriented to Place, Oriented to  Time, Oriented to Situation Alcohol / Substance Use: Not Applicable, Alcohol Use Psych Involvement: No (comment)  Admission diagnosis:  Left breast mass [N63.20] Patient Active Problem List   Diagnosis Date Noted   Left breast mass 03/23/2021   Ductal carcinoma in situ (DCIS) of left breast    Breast mass, left 03/05/2021   PCP:  Olga Coaster, FNP Pharmacy:   [facility] [address] [place] [address] [place] [address] Phone: [phone] Fax: [phone]   Readmission Risk Interventions No flowsheet data found.

## 2021-03-24 NOTE — Evaluation (Signed)
Physical Therapy Evaluation Only Patient Details Name: Samantha Mcfarland MRN: [id number] DOB: Aug 04, 1955 Today's Date: 03/24/2021  History of Present Illness  Samantha Mcfarland is a 65 yo female s/p L breast total mastectomy and sentinel node biopsy 10/3. PMH: GERD, HTN  Clinical Impression  Pt independent with quad cane at baseline, sister provides transportation. Pt educated on exercises, provided written/illustrated handout and cued pt through all exercises to ensure proper technique. Pt able to stand and ambulate throughout hallway and room with quad cane, no LOB, supv for safety. Educated pt to seek counsel on WB restrictions and showering to surgeon/RN. Pt without HEP questions; all education complete, will sign off at this time.        Recommendations for follow up therapy are one component of a multi-disciplinary discharge planning process, led by the attending physician.  Recommendations may be updated based on patient status, additional functional criteria and insurance authorization.  Follow Up Recommendations Follow surgeon's recommendation for DC plan and follow-up therapies    Equipment Recommendations  None recommended by PT    Recommendations for Other Services       Precautions / Restrictions Precautions Precautions: Other (comment) Precaution Comments: post-mastectomy Restrictions Weight Bearing Restrictions: No      Mobility  Bed Mobility  General bed mobility comments: in recliner    Transfers Overall transfer level: Needs assistance Equipment used: Quad cane Transfers: Sit to/from Stand Sit to Stand: Supervision  General transfer comment: powers to stand with light UE assist  Ambulation/Gait Ambulation/Gait assistance: Supervision Gait Distance (Feet): 150 Feet Assistive device: Quad cane Gait Pattern/deviations: WFL(Within Functional Limits) Gait velocity: WFL   General Gait Details: step through pattern with quad cane, no LOB, able to complete turns and  straightline gait while in hallway and navigating past obstacles in room  Stairs            Wheelchair Mobility    Modified Rankin (Stroke Patients Only)       Balance Overall balance assessment: No apparent balance deficits (not formally assessed)        Pertinent Vitals/Pain Pain Assessment: Faces Faces Pain Scale: Hurts a little bit Pain Location: surgical incision site Pain Descriptors / Indicators: Sore Pain Intervention(s): Limited activity within patient's tolerance;Monitored during session;Repositioned    Home Living Family/patient expects to be discharged to:: Private residence Living Arrangements: Alone Available Help at Discharge: Family;Neighbor;Available PRN/intermittently Type of Home: Apartment Home Access: Stairs to enter Entrance Stairs-Rails: Right Entrance Stairs-Number of Steps: 5 Home Layout: One level Home Equipment: Cane - quad      Prior Function Level of Independence: Independent with assistive device(s)  Comments: Pt reports independent with ADLs/IADLs, community ambulation with quad cane, denies falls, sister provides transportation.     Hand Dominance   Dominant Hand: Right    Extremity/Trunk Assessment   Upper Extremity Assessment Upper Extremity Assessment: Overall WFL for tasks assessed;LUE deficits/detail LUE Deficits / Details: shoulder limited to 90 deg flex and abd post-mastectomy LUE Sensation: WNL LUE Coordination: WNL    Lower Extremity Assessment Lower Extremity Assessment: Overall WFL for tasks assessed    Cervical / Trunk Assessment Cervical / Trunk Assessment: Normal  Communication   Communication: No difficulties  Cognition Arousal/Alertness: Awake/alert Behavior During Therapy: WFL for tasks assessed/performed Overall Cognitive Status: Within Functional Limits for tasks assessed         General Comments      Exercises Other Exercises Other Exercises: Shoulder flexion- hands clasped together to  assist LUE into shoulder flexion up  in shoulder height then back down, 5 reps Other Exercises: Scapular Retraction with Elbow Flexion- With elbows bent to 90, pinch shoulder blades together and rotate arms out, keeping elbows bent, 5 reps Other Exercises: Shoulder Abduction - L hand holding cane, R hand holding bottom of cane assisting LUE into shoulder flexion up to shoulder height then back down, 5 reps Other Exercises: Chest stretch - Place both hands behind head. Press elbows backward so that you feel a stretch, 5 reps   Assessment/Plan    PT Assessment Patent does not need any further PT services  PT Problem List         PT Treatment Interventions      PT Goals (Current goals can be found in the Care Plan section)  Acute Rehab PT Goals Patient Stated Goal: home with sister to assist as needed PT Goal Formulation: All assessment and education complete, DC therapy    Frequency     Barriers to discharge        Co-evaluation               AM-PAC PT "6 Clicks" Mobility  Outcome Measure Help needed turning from your back to your side while in a flat bed without using bedrails?: A Little Help needed moving from lying on your back to sitting on the side of a flat bed without using bedrails?: A Little Help needed moving to and from a bed to a chair (including a wheelchair)?: A Little Help needed standing up from a chair using your arms (e.g., wheelchair or bedside chair)?: A Little Help needed to walk in hospital room?: A Little Help needed climbing 3-5 steps with a railing? : A Little 6 Click Score: 18    End of Session   Activity Tolerance: Patient tolerated treatment well Patient left: in chair;with call bell/phone within reach Nurse Communication: Mobility status PT Visit Diagnosis: Other abnormalities of gait and mobility (R26.89)    Time: 0076-2263 PT Time Calculation (min) (ACUTE ONLY): 26 min   Charges:   PT Evaluation $PT Eval Low Complexity: 1 Low PT  Treatments $Therapeutic Exercise: 8-22 mins         Samantha Mcfarland PT, DPT 03/24/21, 12:40 PM

## 2021-03-30 LAB — SURGICAL PATHOLOGY

## 2021-03-30 NOTE — Progress Notes (Signed)
Notified the patient of her pathology. JP not draining much. Will see tomorrow.

## 2021-03-31 ENCOUNTER — Ambulatory Visit (INDEPENDENT_AMBULATORY_CARE_PROVIDER_SITE_OTHER): Payer: Medicare HMO | Admitting: General Surgery

## 2021-03-31 ENCOUNTER — Other Ambulatory Visit: Payer: Self-pay

## 2021-03-31 ENCOUNTER — Encounter: Payer: Self-pay | Admitting: General Surgery

## 2021-03-31 VITALS — BP 107/66 | HR 57 | Temp 98.1°F | Resp 14 | Ht 67.0 in | Wt 124.0 lb

## 2021-03-31 DIAGNOSIS — C50912 Malignant neoplasm of unspecified site of left female breast: Secondary | ICD-10-CM

## 2021-03-31 NOTE — Patient Instructions (Signed)
Ok to shower. Pat area dry. Remove bandage in 2 days, keep that area dry until then.  Will refer to Oncology. Will take our rest of staples next week.

## 2021-03-31 NOTE — Progress Notes (Signed)
[facility]  S/p left mastectomy and sentinel node biopsy. Doing well. JP with < 30cc a day SS output.   BP 107/66   Pulse (!) 57   Temp 98.1 F (36.7 C) (Other (Comment))   Resp 14   Ht 5\' 7"  (1.702 m)   Wt 124 lb (56.2 kg)   SpO2 94%   BMI 19.42 kg/m  Staples c/d/I w/o erythema or drainage, minor bruising, 1/2 staples removed, JP with SS output, removed and bandage placed.  S/p left mastectomy and sentinel node for invasive cancer/ DCIS. Doing well  Ok to shower. Pat area dry. Remove bandage in 2 days, keep that area dry until then.  Will refer to Oncology. Will take our rest of staples next week.  Future Appointments  Date Time Provider [place]  04/07/2021 10:15 AM Virl Cagey, MD [facility] None  04/22/2021  1:30 PM Derek Jack, MD [facility] None   [name], MD [facility] [place],  [phone] (office)'

## 2021-04-01 ENCOUNTER — Telehealth (INDEPENDENT_AMBULATORY_CARE_PROVIDER_SITE_OTHER): Payer: Medicare HMO | Admitting: General Surgery

## 2021-04-01 DIAGNOSIS — C50912 Malignant neoplasm of unspecified site of left female breast: Secondary | ICD-10-CM

## 2021-04-01 MED ORDER — OXYCODONE HCL 5 MG PO TABS: 5.0000 mg | ORAL_TABLET | ORAL | 0 refills | Status: DC | PRN

## 2021-04-01 NOTE — Telephone Encounter (Signed)
Patient aware.

## 2021-04-01 NOTE — Telephone Encounter (Signed)
[facility]  Pain medication refilled.  [name], MD [facility] [address] [place], [place] [address] [phone] (office)

## 2021-04-07 ENCOUNTER — Other Ambulatory Visit: Payer: Self-pay

## 2021-04-07 ENCOUNTER — Encounter: Payer: Medicare HMO | Admitting: General Surgery

## 2021-04-07 ENCOUNTER — Ambulatory Visit (INDEPENDENT_AMBULATORY_CARE_PROVIDER_SITE_OTHER): Payer: Medicare HMO | Admitting: General Surgery

## 2021-04-07 ENCOUNTER — Encounter: Payer: Self-pay | Admitting: General Surgery

## 2021-04-07 VITALS — BP 116/73 | HR 71 | Temp 97.4°F | Resp 12 | Ht 67.0 in | Wt 123.0 lb

## 2021-04-07 DIAGNOSIS — C50912 Malignant neoplasm of unspecified site of left female breast: Secondary | ICD-10-CM

## 2021-04-07 MED ORDER — OXYCODONE HCL 5 MG PO TABS: 5.0000 mg | ORAL_TABLET | ORAL | 0 refills | Status: DC | PRN

## 2021-04-07 NOTE — Progress Notes (Signed)
[facility]  Breast incision healing. No signs of infection. Remaining staples removed. Steri strips placed. BP 116/73   Pulse 71   Temp (!) 97.4 F (36.3 C) (Other (Comment))   Resp 12   Ht 5\' 7"  (1.702 m)   Wt 123 lb (55.8 kg)   SpO2 95%   BMI 19.26 kg/m   Patient with left breast cancer s/p mastectomy and sentinel node. Doing well.   You can get flu shot. You can ride on your bike. Follow up with Oncology.  Continue exercises  Roxicodone refilled PRN follow up   Future Appointments  Date Time Provider [place]  04/22/2021  1:30 PM Derek Jack, MD [facility] None  05/01/2021  1:05 PM Loman Brooklyn, FNP [facility] None   [name], MD [facility] [address] [place], [place] [address] [phone] (office)

## 2021-04-07 NOTE — Patient Instructions (Signed)
You can get shots. You can ride on your bike. Follow up with Oncology.

## 2021-04-13 DIAGNOSIS — R69 Illness, unspecified: Secondary | ICD-10-CM | POA: Diagnosis not present

## 2021-04-13 DIAGNOSIS — M2550 Pain in unspecified joint: Secondary | ICD-10-CM | POA: Diagnosis not present

## 2021-04-13 DIAGNOSIS — K5900 Constipation, unspecified: Secondary | ICD-10-CM | POA: Diagnosis not present

## 2021-04-13 DIAGNOSIS — H269 Unspecified cataract: Secondary | ICD-10-CM | POA: Diagnosis not present

## 2021-04-13 DIAGNOSIS — Z803 Family history of malignant neoplasm of breast: Secondary | ICD-10-CM | POA: Diagnosis not present

## 2021-04-13 DIAGNOSIS — K219 Gastro-esophageal reflux disease without esophagitis: Secondary | ICD-10-CM | POA: Diagnosis not present

## 2021-04-13 DIAGNOSIS — G3184 Mild cognitive impairment, so stated: Secondary | ICD-10-CM | POA: Diagnosis not present

## 2021-04-13 DIAGNOSIS — E785 Hyperlipidemia, unspecified: Secondary | ICD-10-CM | POA: Diagnosis not present

## 2021-04-13 DIAGNOSIS — Z7722 Contact with and (suspected) exposure to environmental tobacco smoke (acute) (chronic): Secondary | ICD-10-CM | POA: Diagnosis not present

## 2021-04-13 DIAGNOSIS — I10 Essential (primary) hypertension: Secondary | ICD-10-CM | POA: Diagnosis not present

## 2021-04-13 DIAGNOSIS — F321 Major depressive disorder, single episode, moderate: Secondary | ICD-10-CM | POA: Diagnosis not present

## 2021-04-13 DIAGNOSIS — F4321 Adjustment disorder with depressed mood: Secondary | ICD-10-CM | POA: Diagnosis not present

## 2021-04-13 DIAGNOSIS — Z604 Social exclusion and rejection: Secondary | ICD-10-CM | POA: Diagnosis not present

## 2021-04-16 ENCOUNTER — Telehealth (INDEPENDENT_AMBULATORY_CARE_PROVIDER_SITE_OTHER): Payer: Medicare HMO | Admitting: Family Medicine

## 2021-04-16 DIAGNOSIS — C50912 Malignant neoplasm of unspecified site of left female breast: Secondary | ICD-10-CM

## 2021-04-16 MED ORDER — ONDANSETRON 4 MG PO TBDP: 4.0000 mg | ORAL_TABLET | Freq: Four times a day (QID) | ORAL | 0 refills | Status: DC | PRN

## 2021-04-16 MED ORDER — OXYCODONE HCL 5 MG PO TABS: 5.0000 mg | ORAL_TABLET | ORAL | 0 refills | Status: DC | PRN

## 2021-04-16 NOTE — Telephone Encounter (Signed)
Patient requesting a refill on Oxycodone and nausea medication.   [place] [facility] [place]

## 2021-04-16 NOTE — Telephone Encounter (Signed)
[facility]  Refilled one last time. Patient sees Dr. Delton Coombes 11/2. Should be able to come off the roxicodone as she is getting several weeks post op.   [name], MD [facility] [address] [place], [place] [address] [phone] (office)

## 2021-04-21 NOTE — Progress Notes (Signed)
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 am acting as a scribe for Dr. Derek Jack.  I, Derek Jack MD, have reviewed the above documentation for  accuracy and completeness, and I agree with the above.

## 2021-04-22 ENCOUNTER — Inpatient Hospital Stay (HOSPITAL_COMMUNITY): Payer: Medicare HMO

## 2021-04-22 ENCOUNTER — Inpatient Hospital Stay (HOSPITAL_COMMUNITY): Payer: Medicare HMO | Attending: Hematology | Admitting: Hematology

## 2021-04-22 ENCOUNTER — Other Ambulatory Visit: Payer: Self-pay

## 2021-04-22 ENCOUNTER — Encounter (HOSPITAL_COMMUNITY): Payer: Self-pay | Admitting: Hematology

## 2021-04-22 DIAGNOSIS — C50912 Malignant neoplasm of unspecified site of left female breast: Secondary | ICD-10-CM | POA: Insufficient documentation

## 2021-04-22 DIAGNOSIS — K5900 Constipation, unspecified: Secondary | ICD-10-CM | POA: Diagnosis not present

## 2021-04-22 DIAGNOSIS — R519 Headache, unspecified: Secondary | ICD-10-CM | POA: Diagnosis not present

## 2021-04-22 DIAGNOSIS — Z9012 Acquired absence of left breast and nipple: Secondary | ICD-10-CM | POA: Insufficient documentation

## 2021-04-22 DIAGNOSIS — R2689 Other abnormalities of gait and mobility: Secondary | ICD-10-CM | POA: Diagnosis not present

## 2021-04-22 DIAGNOSIS — E785 Hyperlipidemia, unspecified: Secondary | ICD-10-CM | POA: Diagnosis not present

## 2021-04-22 DIAGNOSIS — K219 Gastro-esophageal reflux disease without esophagitis: Secondary | ICD-10-CM | POA: Diagnosis not present

## 2021-04-22 DIAGNOSIS — R110 Nausea: Secondary | ICD-10-CM | POA: Insufficient documentation

## 2021-04-22 DIAGNOSIS — M8580 Other specified disorders of bone density and structure, unspecified site: Secondary | ICD-10-CM | POA: Diagnosis not present

## 2021-04-22 DIAGNOSIS — I10 Essential (primary) hypertension: Secondary | ICD-10-CM | POA: Insufficient documentation

## 2021-04-22 DIAGNOSIS — Z803 Family history of malignant neoplasm of breast: Secondary | ICD-10-CM | POA: Diagnosis not present

## 2021-04-22 DIAGNOSIS — Z170 Estrogen receptor positive status [ER+]: Secondary | ICD-10-CM | POA: Diagnosis not present

## 2021-04-22 DIAGNOSIS — R0781 Pleurodynia: Secondary | ICD-10-CM | POA: Diagnosis not present

## 2021-04-22 DIAGNOSIS — Z853 Personal history of malignant neoplasm of breast: Secondary | ICD-10-CM | POA: Diagnosis not present

## 2021-04-22 DIAGNOSIS — Z800 Family history of malignant neoplasm of digestive organs: Secondary | ICD-10-CM | POA: Diagnosis not present

## 2021-04-22 DIAGNOSIS — R5383 Other fatigue: Secondary | ICD-10-CM | POA: Diagnosis not present

## 2021-04-22 DIAGNOSIS — Z87891 Personal history of nicotine dependence: Secondary | ICD-10-CM | POA: Insufficient documentation

## 2021-04-22 DIAGNOSIS — Z79899 Other long term (current) drug therapy: Secondary | ICD-10-CM | POA: Insufficient documentation

## 2021-04-22 LAB — VITAMIN B12: Vitamin B-12: 712 pg/mL (ref 180–914)

## 2021-04-22 LAB — FOLATE: Folate: 15.9 ng/mL (ref >5.9)

## 2021-04-22 LAB — TSH: TSH: 2.029 u[IU]/mL (ref 0.350–4.500)

## 2021-04-22 LAB — CBC WITH DIFFERENTIAL/PLATELET
Abs Immature Granulocytes: 0.01 10*3/uL (ref 0.00–0.07)
Basophils Absolute: 0 10*3/uL (ref 0.0–0.1)
Basophils Relative: 1 %
Eosinophils Absolute: 0.1 10*3/uL (ref 0.0–0.5)
Eosinophils Relative: 2 %
HCT: 33.5 % — ABNORMAL LOW (ref 36.0–46.0)
Hemoglobin: 11.6 g/dL — ABNORMAL LOW (ref 12.0–15.0)
Immature Granulocytes: 0 %
Lymphocytes Relative: 28 %
Lymphs Abs: 1.9 10*3/uL (ref 0.7–4.0)
MCH: 34.5 pg — ABNORMAL HIGH (ref 26.0–34.0)
MCHC: 34.6 g/dL (ref 30.0–36.0)
MCV: 99.7 fL (ref 80.0–100.0)
Monocytes Absolute: 0.5 10*3/uL (ref 0.1–1.0)
Monocytes Relative: 7 %
Neutro Abs: 4.2 10*3/uL (ref 1.7–7.7)
Neutrophils Relative %: 62 %
Platelets: 276 10*3/uL (ref 150–400)
RBC: 3.36 MIL/uL — ABNORMAL LOW (ref 3.87–5.11)
RDW: 13.8 % (ref 11.5–15.5)
WBC: 6.8 10*3/uL (ref 4.0–10.5)
nRBC: 0 % (ref 0.0–0.2)

## 2021-04-22 LAB — COMPREHENSIVE METABOLIC PANEL
ALT: 14 U/L (ref 0–44)
AST: 27 U/L (ref 15–41)
Albumin: 4.9 g/dL (ref 3.5–5.0)
Alkaline Phosphatase: 44 U/L (ref 38–126)
Anion gap: 10 (ref 5–15)
BUN: 9 mg/dL (ref 8–23)
CO2: 25 mmol/L (ref 22–32)
Calcium: 9.6 mg/dL (ref 8.9–10.3)
Chloride: 95 mmol/L — ABNORMAL LOW (ref 98–111)
Creatinine, Ser: 0.56 mg/dL (ref 0.44–1.00)
GFR, Estimated: >60 mL/min (ref >60)
Glucose, Bld: 82 mg/dL (ref 70–99)
Potassium: 3.9 mmol/L (ref 3.5–5.1)
Sodium: 130 mmol/L — ABNORMAL LOW (ref 135–145)
Total Bilirubin: 0.5 mg/dL (ref 0.3–1.2)
Total Protein: 7.8 g/dL (ref 6.5–8.1)

## 2021-04-22 LAB — IRON AND TIBC
Iron: 73 ug/dL (ref 28–170)
Saturation Ratios: 21 % (ref 10.4–31.8)
TIBC: 344 ug/dL (ref 250–450)
UIBC: 271 ug/dL

## 2021-04-22 LAB — VITAMIN D 25 HYDROXY (VIT D DEFICIENCY, FRACTURES): Vit D, 25-Hydroxy: 41.56 ng/mL (ref 30–100)

## 2021-04-22 LAB — FERRITIN: Ferritin: 122 ng/mL (ref 11–307)

## 2021-04-22 NOTE — Patient Instructions (Addendum)
Samantha Mcfarland at [facility] Discharge Instructions  You were seen and examined today by Dr. Delton Coombes. Dr. Delton Coombes is a medical oncologist, meaning that he specializes in cancer diagnoses. Dr. Delton Coombes discussed your past medical history, family history of cancers and the events that led to you being here today.  You have been diagnosed Stage I Invasive Ductal Carcinoma. This is the most common type of breast cancer. It appears that everything was removed during surgery, Dr. Delton Coombes has recommended a whole body bone scan to ensure that there is no cancer impacting your bones. Dr. Delton Coombes also recommends additional testing on the surgical specimen to see if there is any role for chemotherapy. If there is no role for chemotherapy, you would be placed on an anti-estrogen pill for at least 5 years.  Follow-up as scheduled.   Thank you for choosing [place] at [facility] to provide your oncology and hematology care.  To afford each patient quality time with our provider, please arrive at least 15 minutes before your scheduled appointment time.   If you have a lab appointment with the [facility] please come in thru the Main Entrance and check in at the main information desk.  You need to re-schedule your appointment should you arrive 10 or more minutes late.  We strive to give you quality time with our providers, and arriving late affects you and other patients whose appointments are after yours.  Also, if you no show three or more times for appointments you may be dismissed from the clinic at the providers discretion.     Again, thank you for choosing [facility].  Our hope is that these requests will decrease the amount of time that you wait before being seen by our physicians.       _____________________________________________________________  Should you have questions after your visit to [facility], please  contact our office at [phone] and follow the prompts.  Our office hours are 8:00 a.m. and 4:30 p.m. Monday - Friday.  Please note that voicemails left after 4:00 p.m. may not be returned until the following business day.  We are closed weekends and major holidays.  You do have access to a nurse 24-7, just call the main number to the clinic [phone] and do not press any options, hold on the line and a nurse will answer the phone.    For prescription refill requests, have your pharmacy contact our office and allow 72 hours.    Due to Covid, you will need to wear a mask upon entering the hospital. If you do not have a mask, a mask will be given to you at the Main Entrance upon arrival. For doctor visits, patients may have 1 support person age 44 or older with them. For treatment visits, patients can not have anyone with them due to social distancing guidelines and our immunocompromised population.

## 2021-04-23 ENCOUNTER — Encounter (HOSPITAL_COMMUNITY): Payer: Self-pay

## 2021-04-23 NOTE — Progress Notes (Signed)
Oncotype DX requested on [id number] per Dr. Delton Coombes

## 2021-04-30 ENCOUNTER — Other Ambulatory Visit: Payer: Self-pay

## 2021-04-30 ENCOUNTER — Encounter (HOSPITAL_COMMUNITY)
Admission: RE | Admit: 2021-04-30 | Discharge: 2021-04-30 | Disposition: A | Discharge status: Home / Self Care | Payer: Medicare HMO | Source: Ambulatory Visit | Attending: Hematology | Admitting: Hematology

## 2021-04-30 ENCOUNTER — Ambulatory Visit (HOSPITAL_COMMUNITY)
Admission: RE | Admit: 2021-04-30 | Discharge: 2021-04-30 | Disposition: A | Discharge status: Home / Self Care | Payer: Medicare HMO | Source: Ambulatory Visit | Attending: Hematology | Admitting: Hematology

## 2021-04-30 DIAGNOSIS — C50912 Malignant neoplasm of unspecified site of left female breast: Secondary | ICD-10-CM | POA: Insufficient documentation

## 2021-04-30 DIAGNOSIS — M85832 Other specified disorders of bone density and structure, left forearm: Secondary | ICD-10-CM | POA: Diagnosis not present

## 2021-04-30 DIAGNOSIS — F17200 Nicotine dependence, unspecified, uncomplicated: Secondary | ICD-10-CM | POA: Diagnosis not present

## 2021-04-30 DIAGNOSIS — Z13820 Encounter for screening for osteoporosis: Secondary | ICD-10-CM | POA: Diagnosis not present

## 2021-04-30 DIAGNOSIS — Z170 Estrogen receptor positive status [ER+]: Secondary | ICD-10-CM | POA: Insufficient documentation

## 2021-04-30 DIAGNOSIS — C50919 Malignant neoplasm of unspecified site of unspecified female breast: Secondary | ICD-10-CM | POA: Diagnosis not present

## 2021-04-30 DIAGNOSIS — M8589 Other specified disorders of bone density and structure, multiple sites: Secondary | ICD-10-CM | POA: Insufficient documentation

## 2021-04-30 DIAGNOSIS — M8580 Other specified disorders of bone density and structure, unspecified site: Secondary | ICD-10-CM | POA: Insufficient documentation

## 2021-04-30 DIAGNOSIS — R69 Illness, unspecified: Secondary | ICD-10-CM | POA: Diagnosis not present

## 2021-04-30 DIAGNOSIS — Z780 Asymptomatic menopausal state: Secondary | ICD-10-CM | POA: Insufficient documentation

## 2021-04-30 DIAGNOSIS — M85851 Other specified disorders of bone density and structure, right thigh: Secondary | ICD-10-CM | POA: Diagnosis not present

## 2021-04-30 HISTORY — DX: Other specified disorders of bone density and structure, unspecified site: M85.80

## 2021-04-30 MED ORDER — TECHNETIUM TC 99M MEDRONATE IV KIT
20.0000 | PACK | Freq: Once | INTRAVENOUS | Status: AC | PRN
  Administered 2021-04-30: 20.5 via INTRAVENOUS

## 2021-05-01 ENCOUNTER — Ambulatory Visit (INDEPENDENT_AMBULATORY_CARE_PROVIDER_SITE_OTHER): Payer: Medicare HMO | Admitting: Family Medicine

## 2021-05-01 ENCOUNTER — Encounter: Payer: Self-pay | Admitting: Family Medicine

## 2021-05-01 VITALS — BP 114/64 | HR 69 | Temp 97.7°F | Ht 67.0 in | Wt 128.0 lb

## 2021-05-01 DIAGNOSIS — M8580 Other specified disorders of bone density and structure, unspecified site: Secondary | ICD-10-CM

## 2021-05-01 DIAGNOSIS — F331 Major depressive disorder, recurrent, moderate: Secondary | ICD-10-CM | POA: Insufficient documentation

## 2021-05-01 DIAGNOSIS — E559 Vitamin D deficiency, unspecified: Secondary | ICD-10-CM | POA: Insufficient documentation

## 2021-05-01 DIAGNOSIS — Z8639 Personal history of other endocrine, nutritional and metabolic disease: Secondary | ICD-10-CM | POA: Diagnosis not present

## 2021-05-01 DIAGNOSIS — F321 Major depressive disorder, single episode, moderate: Secondary | ICD-10-CM | POA: Insufficient documentation

## 2021-05-01 DIAGNOSIS — Z853 Personal history of malignant neoplasm of breast: Secondary | ICD-10-CM | POA: Insufficient documentation

## 2021-05-01 DIAGNOSIS — I10 Essential (primary) hypertension: Secondary | ICD-10-CM | POA: Insufficient documentation

## 2021-05-01 DIAGNOSIS — R69 Illness, unspecified: Secondary | ICD-10-CM | POA: Diagnosis not present

## 2021-05-01 DIAGNOSIS — M8589 Other specified disorders of bone density and structure, multiple sites: Secondary | ICD-10-CM

## 2021-05-01 DIAGNOSIS — G8929 Other chronic pain: Secondary | ICD-10-CM | POA: Insufficient documentation

## 2021-05-01 DIAGNOSIS — E782 Mixed hyperlipidemia: Secondary | ICD-10-CM

## 2021-05-01 DIAGNOSIS — Z23 Encounter for immunization: Secondary | ICD-10-CM | POA: Diagnosis not present

## 2021-05-01 DIAGNOSIS — K219 Gastro-esophageal reflux disease without esophagitis: Secondary | ICD-10-CM

## 2021-05-01 DIAGNOSIS — F411 Generalized anxiety disorder: Secondary | ICD-10-CM

## 2021-05-01 DIAGNOSIS — G479 Sleep disorder, unspecified: Secondary | ICD-10-CM | POA: Diagnosis not present

## 2021-05-01 DIAGNOSIS — M542 Cervicalgia: Secondary | ICD-10-CM

## 2021-05-01 MED ORDER — LISINOPRIL 10 MG PO TABS: 10.0000 mg | ORAL_TABLET | Freq: Every day | ORAL | 2 refills | Status: DC

## 2021-05-01 MED ORDER — DULOXETINE HCL 30 MG PO CPEP: 30.0000 mg | ORAL_CAPSULE | Freq: Every day | ORAL | 2 refills | Status: DC

## 2021-05-01 MED ORDER — OMEPRAZOLE 20 MG PO CPDR
20.0000 mg | DELAYED_RELEASE_CAPSULE | Freq: Every day | ORAL | 2 refills | Status: DC | PRN
Start: 2021-05-01 — End: 2022-03-05

## 2021-05-01 MED ORDER — ATORVASTATIN CALCIUM 20 MG PO TABS
20.0000 mg | ORAL_TABLET | Freq: Every day | ORAL | 2 refills | Status: DC
Start: 2021-05-01 — End: 2021-08-11

## 2021-05-01 NOTE — Patient Instructions (Signed)
You have osteopenia (low bone mass), which can progress to osteoporosis.  Please take over the counter Calcium 1,200 mg and Vitamin  D3 1,000 IU once daily to protect your bones. Doing weightbearing exercise like walking, jogging, dancing, etc. is good for you bones.

## 2021-05-01 NOTE — Progress Notes (Signed)
New Patient Office Visit  Assessment & Plan:  1. History of left breast cancer Managed by oncology.  2. Gastroesophageal reflux disease, unspecified whether esophagitis present Well controlled on current regimen.  - omeprazole (PRILOSEC) 20 MG capsule; Take 1 capsule (20 mg total) by mouth daily as needed (indigestion/heartburn).  Dispense: 30 capsule; Refill: 2  3. Mixed hyperlipidemia Well controlled on current regimen.  - atorvastatin (LIPITOR) 20 MG tablet; Take 1 tablet (20 mg total) by mouth daily.  Dispense: 30 tablet; Refill: 2  4. Essential hypertension Well controlled on current regimen.  - lisinopril (ZESTRIL) 10 MG tablet; Take 1 tablet (10 mg total) by mouth daily with lunch.  Dispense: 30 tablet; Refill: 2  5. History of vitamin D deficiency Advised patient she can switch from weekly supplement to daily 1,000 units.  6. Generalized anxiety disorder Uncontrolled. Started Cymbalta which was chosen to also help with chronic neck pain.  - DULoxetine (CYMBALTA) 30 MG capsule; Take 1 capsule (30 mg total) by mouth daily.  Dispense: 30 capsule; Refill: 2  7. Moderate recurrent major depression (HCC) Uncontrolled. Started Cymbalta which was chosen to also help with chronic neck pain.  - DULoxetine (CYMBALTA) 30 MG capsule; Take 1 capsule (30 mg total) by mouth daily.  Dispense: 30 capsule; Refill: 2  8. Difficulty sleeping Will address after addressing anxiety and depression.  9. Osteopenia of multiple sites Education provided on osteopenia. Encouraged to start calcium supplement. Continue a vitamin D supplement,  10. Chronic neck pain Started Cymbalta for anxiety/depression to also help with neck pain. - DULoxetine (CYMBALTA) 30 MG capsule; Take 1 capsule (30 mg total) by mouth daily.  Dispense: 30 capsule; Refill: 2  11. Need for immunization against influenza - Flu Vaccine QUAD High Dose(Fluad)   Follow-up: Return in about 6 weeks (around 06/12/2021) for  Depression, anxiety, & sleep.   Hendricks Limes, MSN, APRN, FNP-C [facility]  Subjective:  Patient ID: Samantha Mcfarland, adult    DOB: 1955-06-25  Age: 65 y.o. MRN: [id number]  Patient Care Team: Loman Brooklyn, FNP as PCP - General (Family Medicine) Derek Jack, MD as Medical Oncologist (Medical Oncology) Brien Mates, RN as Oncology Nurse Navigator (Oncology)  CC:  Chief Complaint  Patient presents with   New Patient (Initial Visit)    [place]         HPI Samantha Mcfarland presents to establish care. She is transferring care from the [facility] in [place].  Patient is accompanied by her sister, who she is okay with being present.   Breast Cancer: diagnosed with breast cancer of the left breast in September 2022. She had a left mastectomy on 03/23/2021. She is currently being managed by Dr. Delton Coombes, oncologist.   GERD: taking Prilosec daily as needed.  Hyperlipidemia: taking Atorvastatin daily.  Hypertension: taking Lisinopril daily.   Vitamin D insufficiency: previously on vitamin D 50,000 units once weekly but her last vitamin D level on 04/22/2021 was normal.   Anxiety/Depression: had hydroxyzine, but states it makes her ill as a hornet and didn't help much besides to make her fall asleep. She has taken multiple SSRIs in the past but states she didn't stay on them long enough to know if they were going to help. She reports Ativan, Xanax, and Valium were helpful. She reports difficulty sleeping that has gotten worse since she was diagnosed with breast cancer. She woke frequently during the night prior to her  diagnosis as well. She has been taking Benadryl to help with sleep.   Depression screen PHQ 2/9 05/01/2021  Decreased Interest 1  Down, Depressed, Hopeless 1  PHQ - 2 Score 2  Altered sleeping 1  Tired, decreased energy 3  Change in appetite 2  Feeling bad or failure about yourself  1  Trouble  concentrating 1  Moving slowly or fidgety/restless 0  Suicidal thoughts 0  PHQ-9 Score 10  Difficult doing work/chores Somewhat difficult   GAD 7 : Generalized Anxiety Score 05/01/2021  Nervous, Anxious, on Edge 2  Control/stop worrying 3  Worry too much - different things 3  Trouble relaxing 3  Restless 0  Easily annoyed or irritable 2  Afraid - awful might happen 2  Total GAD 7 Score 15  Anxiety Difficulty Somewhat difficult    Review of Systems  Constitutional:  Negative for chills, fever, malaise/fatigue and weight loss.  HENT:  Negative for congestion, ear discharge, ear pain, nosebleeds, sinus pain, sore throat and tinnitus.   Eyes:  Negative for blurred vision, double vision, pain, discharge and redness.  Respiratory:  Negative for cough, shortness of breath and wheezing.   Cardiovascular:  Negative for chest pain, palpitations and leg swelling.  Gastrointestinal:  Positive for heartburn. Negative for abdominal pain, constipation, diarrhea, nausea and vomiting.  Genitourinary:  Negative for dysuria, frequency and urgency.  Musculoskeletal:  Positive for neck pain. Negative for myalgias.  Skin:  Negative for rash.  Neurological:  Negative for dizziness, seizures, weakness and headaches.  Psychiatric/Behavioral:  Positive for depression. Negative for substance abuse and suicidal ideas. The patient is nervous/anxious and has insomnia.    Current Outpatient Medications:    acetaminophen (TYLENOL) 325 MG tablet, Take 325 mg by mouth every 6 (six) hours as needed (for pain/headaches.)., Disp: , Rfl:    atorvastatin (LIPITOR) 20 MG tablet, Take 20 mg by mouth in the morning., Disp: , Rfl:    lisinopril (ZESTRIL) 10 MG tablet, Take 10 mg by mouth daily with lunch., Disp: , Rfl:    omeprazole (PRILOSEC) 20 MG capsule, Take 20 mg by mouth daily as needed (indigestion/heartburn)., Disp: , Rfl:    docusate sodium (COLACE) 100 MG capsule, Take 1 capsule (100 mg total) by mouth 2 (two)  times daily as needed for mild constipation (while taking narcotic). (Patient not taking: No sig reported), Disp: 60 capsule, Rfl: 0   ergocalciferol (VITAMIN D2) 1.25 MG (50000 UT) capsule, Take 50,000 Units by mouth every Friday. (Patient not taking: Reported on 05/01/2021), Disp: , Rfl:    hydrOXYzine (ATARAX/VISTARIL) 25 MG tablet, Take 25 mg by mouth at bedtime as needed (insomnia). (Patient not taking: No sig reported), Disp: , Rfl:   Allergies  Allergen Reactions   Penicillins     Allergic as a child. (Rash ???) Does not recall what type of reaction she has to drug.    Past Medical History:  Diagnosis Date   Allergy    Anxiety    Breast cancer ([place])    Depression    GERD (gastroesophageal reflux disease)    Hyperlipidemia    Hypertension    Vitamin D insufficiency     Past Surgical History:  Procedure Laterality Date   MASTECTOMY W/ SENTINEL NODE BIOPSY Left 03/23/2021   Procedure: TOTAL MASTECTOMY WITH SENTINEL LYMPH NODE BIOPSY;  Surgeon: Virl Cagey, MD;  Location: [facility];  Service: General;  Laterality: Left;   NECK SURGERY     OOPHORECTOMY     SALPINGECTOMY  Family History  Problem Relation Age of Onset   Stroke Mother    Alcohol abuse Mother    Diabetes Father    Pancreatic cancer Father    Testicular cancer Father    Pancreatic disease Father    Breast cancer Maternal Aunt    Breast cancer Paternal Aunt    Breast cancer Maternal Grandmother    Colon cancer Paternal Grandfather     Social History   Socioeconomic History   Marital status: Divorced    Spouse name: Not on file   Number of children: Not on file   Years of education: Not on file   Highest education level: Not on file  Occupational History   Not on file  Tobacco Use   Smoking status: Some Days    Packs/day: 0.25    Types: Cigarettes   Smokeless tobacco: Never   Tobacco comments:    1 pack every 3-4 days  Vaping Use   Vaping Use: Never used  Substance and Sexual  Activity   Alcohol use: Yes    Alcohol/week: 20.0 standard drinks    Types: 20 Cans of beer per week   Drug use: Never   Sexual activity: Not Currently    Birth control/protection: Post-menopausal  Other Topics Concern   Not on file  Social History Narrative   Not on file   Social Determinants of Health   Financial Resource Strain: Not on file  Food Insecurity: No Food Insecurity   Worried About Running Out of Food in the Last Year: Never true   Ran Out of Food in the Last Year: Never true  Transportation Needs: No Transportation Needs   Lack of Transportation (Medical): No   Lack of Transportation (Non-Medical): No  Physical Activity: Not on file  Stress: Not on file  Social Connections: Not on file  Intimate Partner Violence: Not on file    Objective:   Today's Vitals: BP 114/64   Pulse 69   Temp 97.7 F (36.5 C) (Temporal)   Ht 5\' 7"  (1.702 m)   Wt 128 lb (58.1 kg)   BMI 20.05 kg/m   Physical Exam Vitals reviewed.  Constitutional:      General: She is not in acute distress.    Appearance: Normal appearance. She is normal weight. She is not ill-appearing, toxic-appearing or diaphoretic.  HENT:     Head: Normocephalic and atraumatic.  Eyes:     General: No scleral icterus.       Right eye: No discharge.        Left eye: No discharge.     Conjunctiva/sclera: Conjunctivae normal.  Cardiovascular:     Rate and Rhythm: Normal rate and regular rhythm.     Heart sounds: Normal heart sounds. No murmur heard.   No friction rub. No gallop.  Pulmonary:     Effort: Pulmonary effort is normal. No respiratory distress.     Breath sounds: Normal breath sounds. No stridor. No wheezing, rhonchi or rales.  Musculoskeletal:        General: Normal range of motion.     Cervical back: Normal range of motion.  Skin:    General: Skin is warm and dry.  Neurological:     Mental Status: She is alert and oriented to person, place, and time. Mental status is at baseline.   Psychiatric:        Mood and Affect: Mood normal.        Behavior: Behavior normal.  Thought Content: Thought content normal.        Judgment: Judgment normal.

## 2021-05-03 ENCOUNTER — Encounter: Payer: Self-pay | Admitting: Family Medicine

## 2021-05-05 NOTE — Progress Notes (Signed)
[facility] [address] [place] [address]   Patient Care Team: Loman Brooklyn, FNP as PCP - General (Family Medicine) Derek Jack, MD as Medical Oncologist (Medical Oncology) Brien Mates, RN as Oncology Nurse Navigator (Oncology)  SUMMARY OF ONCOLOGIC HISTORY: Oncology History  Breast cancer, left ([place])  04/22/2021 Initial Diagnosis   Breast cancer, left ([place])   04/22/2021 Cancer Staging   Staging form: Breast, AJCC 8th Edition - Clinical stage from 04/22/2021: Stage IA (cT1c, cN0, cM0, G2, ER+, PR-, HER2-) - Signed by Derek Jack, MD on 04/22/2021 Stage prefix: Initial diagnosis Nuclear grade: G2 Histologic grading system: 3 grade system HER2-IHC interpretation: Negative HER2-IHC value: Score 1+ Ki-67 (%): 25      CHIEF COMPLIANT: Follow-up of left breast cancer   INTERVAL HISTORY: [name] is a 65 y.o. adult here today for follow up of her left breast cancer. Her last visit was on 04/22/2021.   Today she reports feeling well, and she is accompanied by her sister. She currently walks with the assistance of a cane as she reports intermittently poor balance. She denies numbness/tingling in her hands or feet.   REVIEW OF SYSTEMS:   Review of Systems  Constitutional:  Negative for appetite change (50%) and fatigue (50%).  Gastrointestinal:  Positive for constipation and nausea.  Musculoskeletal:  Positive for gait problem (off balance).  Neurological:  Positive for gait problem (off balance) and headaches. Negative for numbness.  All other systems reviewed and are negative.  I have reviewed the past medical history, past surgical history, social history and family history with the patient and they are unchanged from previous note.   ALLERGIES:   is allergic to penicillins.   MEDICATIONS:  Current Outpatient Medications  Medication Sig Dispense Refill   acetaminophen (TYLENOL) 325 MG tablet Take 325 mg by mouth  every 6 (six) hours as needed (for pain/headaches.).     atorvastatin (LIPITOR) 20 MG tablet Take 1 tablet (20 mg total) by mouth daily. 30 tablet 2   DULoxetine (CYMBALTA) 30 MG capsule Take 1 capsule (30 mg total) by mouth daily. 30 capsule 2   lisinopril (ZESTRIL) 10 MG tablet Take 1 tablet (10 mg total) by mouth daily with lunch. 30 tablet 2   omeprazole (PRILOSEC) 20 MG capsule Take 1 capsule (20 mg total) by mouth daily as needed (indigestion/heartburn). 30 capsule 2   No current facility-administered medications for this visit.     PHYSICAL EXAMINATION: Performance status (ECOG): 1 - Symptomatic but completely ambulatory  There were no vitals filed for this visit. Wt Readings from Last 3 Encounters:  05/01/21 128 lb (58.1 kg)  04/22/21 128 lb 3.2 oz (58.2 kg)  04/07/21 123 lb (55.8 kg)   Physical Exam Vitals reviewed.  Constitutional:      Appearance: Normal appearance.  Cardiovascular:     Rate and Rhythm: Normal rate and regular rhythm.     Pulses: Normal pulses.     Heart sounds: Normal heart sounds.  Pulmonary:     Effort: Pulmonary effort is normal.     Breath sounds: Normal breath sounds.  Neurological:     General: No focal deficit present.     Mental Status: She is alert and oriented to person, place, and time.  Psychiatric:        Mood and Affect: Mood normal.        Behavior: Behavior normal.    Breast Exam Chaperone: Thana Ates  LABORATORY DATA:  I have reviewed the data as listed CMP Latest Ref Rng & Units 04/22/2021  Glucose 70 - 99 mg/dL 82  BUN 8 - 23 mg/dL 9  Creatinine 0.44 - 1.00 mg/dL 0.56  Sodium 135 - 145 mmol/L 130(L)  Potassium 3.5 - 5.1 mmol/L 3.9  Chloride 98 - 111 mmol/L 95(L)  CO2 22 - 32 mmol/L 25  Calcium 8.9 - 10.3 mg/dL 9.6  Total Protein 6.5 - 8.1 g/dL 7.8  Total Bilirubin 0.3 - 1.2 mg/dL 0.5  Alkaline Phos 38 - 126 U/L 44  AST 15 - 41 U/L 27  ALT 0 - 44 U/L 14   No results found for: [id number] Lab Results  Component  Value Date   WBC 6.8 04/22/2021   HGB 11.6 (L) 04/22/2021   HCT 33.5 (L) 04/22/2021   MCV 99.7 04/22/2021   PLT 276 04/22/2021   NEUTROABS 4.2 04/22/2021    ASSESSMENT:  Stage I (T1CN0) left breast IDC, ER positive, HER2/PR negative: - Abnormal mammogram followed by left breast subareolar mass biopsy on 02/24/2021 consistent with DCIS intermediate to high-grade - Left mastectomy and SLNB on 03/23/2021 - Pathology shows 1.5 cm IDC, grade 2, extensive DCIS, margins negative, 0/2 left axillary lymph nodes, PT1CN0.  ER 100%.  PR-negative.  HER2 1+.  Ki-67-25%. - Oncotype DX showed recurrence score 33.  There is more than 15% chemotherapy benefit.  Distant recurrence at 9 years with tamoxifen alone was 21%.   Social/family history: - She lives by herself.  She is seen with her sister today. 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 - She quit smoking 1 month ago.  Half pack per day for 35 years. - She drinks alcohol 3-5 beers daily. - Father had prostate and pancreatic cancer.  Maternal grandmother and maternal aunt had breast cancer.  Paternal grandfather had colon cancer.  Other people in her family also had cancers, type unknown to the patient.   PLAN:  Stage I (T1CN0) left breast IDC, ER positive, HER2/PR negative: -As she had left-sided upper and lower rib pain, we have done a bone scan. - Reviewed bone scan results from 04/30/2021 which was negative. - Labs from 04/22/2021 were reviewed which showed mild normocytic anemia with normal ferritin, K86 and folic acid.  Renal function was normal. - Reviewed Oncotype DX results which showed recurrence score 33. - I have recommended chemotherapy with 4 cycles of docetaxel and cyclophosphamide every 3 weeks. - Recommend port placement. - We also talked about side effects of this regimen in detail. - We will consider growth factor support. - I have also recommended genetic testing given her extensive family history and  personal history. - RTC 2 weeks to initiate treatment.  2.  Bone health (osteopenia): -We discussed vitamin D level which was 41.  We reviewed DEXA scan from 04/30/2021 with T score -1.5 with osteopenia. - Continue calcium and vitamin D supplements.  Breast Cancer therapy associated bone loss: I have recommended calcium, Vitamin D and weight bearing exercises.  Orders placed this encounter:  No orders of the defined types were placed in this encounter.   The patient has a good understanding of the overall plan. She agrees with it. She will call with any problems that may develop before the next visit here.  Derek Jack, MD [place] [phone]   I, Thana Ates, am acting as a scribe for Dr. Derek Jack.  Kinnie Scales MD, have reviewed the above  documentation for accuracy and completeness, and I agree with the above.

## 2021-05-06 ENCOUNTER — Other Ambulatory Visit: Payer: Self-pay

## 2021-05-06 ENCOUNTER — Inpatient Hospital Stay (HOSPITAL_BASED_OUTPATIENT_CLINIC_OR_DEPARTMENT_OTHER): Payer: Medicare HMO | Admitting: Hematology

## 2021-05-06 ENCOUNTER — Telehealth: Payer: Self-pay | Admitting: Family Medicine

## 2021-05-06 VITALS — BP 117/74 | HR 73 | Temp 98.3°F | Resp 16 | Wt 123.8 lb

## 2021-05-06 DIAGNOSIS — Z170 Estrogen receptor positive status [ER+]: Secondary | ICD-10-CM | POA: Diagnosis not present

## 2021-05-06 DIAGNOSIS — R2689 Other abnormalities of gait and mobility: Secondary | ICD-10-CM | POA: Diagnosis not present

## 2021-05-06 DIAGNOSIS — R5383 Other fatigue: Secondary | ICD-10-CM | POA: Diagnosis not present

## 2021-05-06 DIAGNOSIS — C50912 Malignant neoplasm of unspecified site of left female breast: Secondary | ICD-10-CM | POA: Diagnosis not present

## 2021-05-06 DIAGNOSIS — F331 Major depressive disorder, recurrent, moderate: Secondary | ICD-10-CM

## 2021-05-06 DIAGNOSIS — F411 Generalized anxiety disorder: Secondary | ICD-10-CM

## 2021-05-06 DIAGNOSIS — K5900 Constipation, unspecified: Secondary | ICD-10-CM | POA: Diagnosis not present

## 2021-05-06 DIAGNOSIS — R110 Nausea: Secondary | ICD-10-CM | POA: Diagnosis not present

## 2021-05-06 DIAGNOSIS — Z9012 Acquired absence of left breast and nipple: Secondary | ICD-10-CM | POA: Diagnosis not present

## 2021-05-06 DIAGNOSIS — R519 Headache, unspecified: Secondary | ICD-10-CM | POA: Diagnosis not present

## 2021-05-06 DIAGNOSIS — R0781 Pleurodynia: Secondary | ICD-10-CM | POA: Diagnosis not present

## 2021-05-06 DIAGNOSIS — M8580 Other specified disorders of bone density and structure, unspecified site: Secondary | ICD-10-CM | POA: Diagnosis not present

## 2021-05-06 MED ORDER — FLUOXETINE HCL 20 MG PO CAPS: 20.0000 mg | ORAL_CAPSULE | Freq: Every day | ORAL | 2 refills | Status: DC

## 2021-05-06 NOTE — Progress Notes (Signed)
START ON PATHWAY REGIMEN - Breast     A cycle is every 21 days:     Docetaxel      Cyclophosphamide   **Always confirm dose/schedule in your pharmacy ordering system**  Patient Characteristics: Postoperative without Neoadjuvant Therapy (Pathologic Staging), Invasive Disease, Adjuvant Therapy, HER2 Negative/Unknown/Equivocal, ER Positive, Node Negative, pT1a-c, pN0/N28mi or pT2 or Higher, pN0, Oncotype High Risk (? 26) Therapeutic Status: Postoperative without Neoadjuvant Therapy (Pathologic Staging) AJCC Grade: G2 AJCC N Category: pN0 AJCC M Category: cM0 ER Status: Positive (+) AJCC 8 Stage Grouping: IA HER2 Status: Negative (-) Oncotype Dx Recurrence Score: 33 AJCC T Category: pT1c PR Status: Negative (-) Adjuvant Therapy Status: No Adjuvant Therapy Received Yet or Changing Initial Adjuvant Regimen due to Tolerance Has this patient completed genomic testing<= Yes - Oncotype DX(R) Intent of Therapy: Curative Intent, Discussed with Patient

## 2021-05-06 NOTE — Telephone Encounter (Signed)
Patient aware and verbalizes understanding. 

## 2021-05-06 NOTE — Patient Instructions (Addendum)
Samantha Mcfarland at [facility] Discharge Instructions  You were seen and examined today by Dr. Delton Coombes.  He reviewed the results of your Oncotype Dx results, which suggest you would benefit from chemotherapy + anti-estrogen pills.   The chemotherapy treatment regimen you would receive would be Taxotere and Cytoxan.  You would come to the clinic 1 day every every 3 weeks to receive these drugs.  You would do this for a total of 4 cycles (come 1 day every 3 weeks x 4 times).  We will schedule you for port a cath placement in order to administer chemotherapy.   Return as scheduled for chemotherapy education, lab work, office visit, and first cycle of treatment.      Thank you for choosing [place] at [facility] to provide your oncology and hematology care.  To afford each patient quality time with our provider, please arrive at least 15 minutes before your scheduled appointment time.   If you have a lab appointment with the [facility] please come in thru the Main Entrance and check in at the main information desk.  You need to re-schedule your appointment should you arrive 10 or more minutes late.  We strive to give you quality time with our providers, and arriving late affects you and other patients whose appointments are after yours.  Also, if you no show three or more times for appointments you may be dismissed from the clinic at the providers discretion.     Again, thank you for choosing [facility].  Our hope is that these requests will decrease the amount of time that you wait before being seen by our physicians.       _____________________________________________________________  Should you have questions after your visit to [facility], please contact our office at [phone] and follow the prompts.  Our office hours are 8:00 a.m. and 4:30 p.m. Monday - Friday.  Please note that voicemails left after 4:00  p.m. may not be returned until the following business day.  We are closed weekends and major holidays.  You do have access to a nurse 24-7, just call the main number to the clinic [phone] and do not press any options, hold on the line and a nurse will answer the phone.    For prescription refill requests, have your pharmacy contact our office and allow 72 hours.    Due to Covid, you will need to wear a mask upon entering the hospital. If you do not have a mask, a mask will be given to you at the Main Entrance upon arrival. For doctor visits, patients may have 1 support person age 26 or older with them. For treatment visits, patients can not have anyone with them due to social distancing guidelines and our immunocompromised population.

## 2021-05-06 NOTE — Telephone Encounter (Signed)
Please tell her to stop the Cymbalta and start Prozac. I have already sent it to the pharmacy. This can be a direct switch.

## 2021-05-06 NOTE — Telephone Encounter (Signed)
Pt called to let Britney know that the Cymbalta she is taking, is too strong for her. Says medicine makes her feel like a zombie and says she couldn't even get dressed for 3 days.  Please advise and call patient.

## 2021-05-07 ENCOUNTER — Encounter (HOSPITAL_COMMUNITY): Payer: Self-pay

## 2021-05-07 ENCOUNTER — Encounter (HOSPITAL_COMMUNITY): Payer: Self-pay | Admitting: Hematology

## 2021-05-07 ENCOUNTER — Other Ambulatory Visit (HOSPITAL_COMMUNITY): Payer: Self-pay | Admitting: *Deleted

## 2021-05-07 DIAGNOSIS — C50912 Malignant neoplasm of unspecified site of left female breast: Secondary | ICD-10-CM

## 2021-05-07 DIAGNOSIS — Z95828 Presence of other vascular implants and grafts: Secondary | ICD-10-CM | POA: Insufficient documentation

## 2021-05-07 HISTORY — DX: Presence of other vascular implants and grafts: Z95.828

## 2021-05-07 MED ORDER — PROCHLORPERAZINE MALEATE 10 MG PO TABS: 10.0000 mg | ORAL_TABLET | Freq: Four times a day (QID) | ORAL | 1 refills | Status: DC | PRN

## 2021-05-07 MED ORDER — LIDOCAINE-PRILOCAINE 2.5-2.5 % EX CREA: TOPICAL_CREAM | CUTANEOUS | 3 refills | Status: DC

## 2021-05-07 NOTE — Patient Instructions (Addendum)
[facility] Chemotherapy Teaching   You are diagnosed with Stage I left breast invasive ductal carcinoma, estrogen receptor positive.  You will be treated in the clinic every 3 weeks for a total of 4 cycles with a combination of chemotherapy drugs.  Those drugs are docetaxel (Taxotere) and cyclophosphamide (Cytoxan). The intent of treatment is cure.  You will see the doctor regularly throughout treatment.  We will obtain blood work from you prior to every treatment and monitor your results to make sure it is safe to give your treatment. The doctor monitors your response to treatment by the way you are feeling, your blood work, and by obtaining scans periodically.  There will be wait times while you are here for treatment.  It will take about 30 minutes to 1 hour for your lab work to result.  Then there will be wait times while pharmacy mixes your medications.    Medications you will receive in the clinic prior to your chemotherapy medications:  Aloxi:  ALOXI is used in adults to help prevent the nausea and vomiting that happens with certain chemotherapy drugs.  Aloxi is a long acting medication, and will remain in your system for about.   Dexamethasone:  This is a steroid given prior to chemotherapy to help prevent allergic reactions; it may also help prevent and control nausea and diarrhea.    Neulasta (or similar) - this medication is not chemotherapy but is being given because you have had chemo. It is usually given 24-48 hours after the completion of chemotherapy. This medication works by boosting your bone marrow's supply of white blood cells. White blood cells are what protect our bodies against infection. The medication is given in the form of a subcutaneous injection. It is given in the fatty tissue of your abdomen, or in the skin on the back of your arm . It is a short needle. The major side effect of this medication is bone or muscle pain. The drug of choice to relieve or lessen  the pain is Aleve or Ibuprofen. If a physician has ever told you not to take Aleve or Ibuprofen - then don't take it. You should then take Tylenol/acetaminophen. Take either medication as the bottle directs you to.  The level of pain you experience as a result of this injection can range from none, to mild or moderate, or severe. Please let us know if you develop moderate or severe bone pain.   You can take Claritin 10 mg over the counter for a few days after receiving Neulasta to help with the bone aches and pains.       Docetaxel (Taxotere)  About This Drug  Docetaxel is used to treat cancer. It is given in the vein (IV) through your port a cath.  It will take 1 hour to infuse. Your first infusion will take longer than 1 hour due to the fact that we start it at a very slow rate and gradually increase the rate until the maximum infusion rate is reached.  This is done in order to monitor you closely for allergic/infusion reactions.  Your nurse will remain in the room with you for the first 15 minutes of this infusion on your first time getting it.  If you tolerate the first infusion without adverse reactions, going forward we will give you this drug at the normal infusion rate over 1 hour.   Possible Side Effects  Bone marrow suppression. This is a decrease in the number of white  blood cells, red blood cells, and platelets. This may raise your risk of infection, make you tired and weak (fatigue), and raise your risk of bleeding.   Fever in the setting of decreased white blood cells, which is a serious condition that can be lifethreatening   Soreness of the mouth and throat. You may have red areas, white patches, or sores that hurt.   Nausea and vomiting (throwing up)   Constipation (not able to move bowels)   Diarrhea (loose bowel movements)   Infections   Swelling of your legs, ankles and/or feet, or fluid build-up around your lungs, heart or elsewhere    Changes in the way food and drinks  taste   Effects on the nerves are called peripheral neuropathy. You may feel numbness, tingling, or pain in your hands and feet. It may be hard for you to button your clothes, open jars, or walk as usual. The effect on the nerves may get worse with more doses of the drug. These effects get better in some people after the drug is stopped but it does not get better in all people.   Decreased appetite (decreased hunger)   Weakness   Pain   Muscle pain/aching   Trouble breathing   Changes in your nail color, you may have nail loss and/or brittle nail    Hair loss. Hair loss is often temporary, although there have been cases of permanent hair loss reported. Hair loss may happen suddenly or gradually. If you lose hair, you may lose it from your head, face, armpits, pubic area, chest, and/or legs. You may also notice your hair getting thin.   Allergic skin reaction. You may develop blisters on your skin that are filled with fluid or a severe red rash all over your body that may be painful.   Allergic reactions, including anaphylaxis are rare but may happen in some patients. Signs of allergic reaction to this drug may be swelling of the face, feeling like your tongue or throat are swelling, trouble breathing, rash, itching, fever, chills, feeling dizzy, and/or feeling that your heart is beating in a fast or not normal way. If this happens, do not take another dose of this drug. You should get urgent medical treatment.  Note: Not all possible side effects are included above.  Warnings and Precautions  Severe bone marrow suppression, including febrile neutropenia - fever in the setting of decreased white blood cells, which may be life threatening.   Severe allergic reactions, including anaphylaxis which can be life-threatening   Swelling (inflammation) in the colon in the setting of severely low white blood cells, which raises your risk of infection and can be life-threatening   Severe skin  reactions, including redness, swelling or peeling of skin   Severe swelling in the eye or other changes in eyesight   Severe swelling of your legs, ankles and/or feet. Sometimes, fluid can build up in your lungs and/or around your heart causing you trouble breathing.   If you have a history of abnormal liver function, receive high doses of docetaxel, or have a history of lung cancer and have received treatment with a platinum (type of chemotherapy medication), you have an increased risk of death.   Severe weakness   This drug may raise your risk of getting a second cancer such as leukemia and myelodysplastic syndrome.   Severe peripheral neuropathy - numbness, tingling, or pain in your hands and feet  This drug contains alcohol and may affect your central nervous system.  The central nervous system is made up of your brain and spinal cord. You may feel drunk during and after your treatment and it can impair your ability to drive or use machinery for one to two hours after infusion.    Tumor lysis syndrome: This drug may act on the cancer cells very quickly. This may affect how your kidneys work.  Note: Some of the side effects above are very rare. If you have concerns and/or questions, please discuss them with your medical team.  Important Information  This drug may be present in the saliva, tears, sweat, urine, stool, vomit, semen, and vaginal secretions. Talk to your doctor and/or your nurse about the necessary precautions to take during this time.  Treating Side Effects  Manage tiredness by pacing your activities for the day.   Be sure to include periods of rest between energy-draining activities.   Get regular exercise. If you feel too tired to exercise vigorously, try taking a short walk.   To decrease the risk of infection, wash your hands regularly.   Avoid close contact with people who have a cold, the flu, or other infections.   Take your temperature as your doctor or nurse  tells you, and whenever you feel like you may have a fever.   To help decrease the risk of bleeding, use a soft toothbrush. Check with your nurse before using dental floss.   Be very careful when using knives or tools.   Use an electric shaver instead of a razor.   Mouth care is very important and will help food taste better and improve your appetite. Your mouth care should consist of routine, gentle cleaning of your teeth or dentures and rinsing your mouth with a mixture of 1/2 teaspoon of salt in 8 ounces of water or 1/2 teaspoon of baking soda in 8 ounces of water. This should be done at least after each meal and at bedtime.   If you have mouth sores, avoid mouthwash that has alcohol. Also avoid alcohol and smoking because they can bother your mouth and throat.   Ask your doctor or nurse about medicines that are available to help stop or lessen constipation and/or diarrhea.   If you are not able to move your bowels, check with your doctor or nurse before you use enemas, laxatives, or suppositories.   Drink plenty of fluids (a minimum of eight glasses per day - 64 oz -  is recommended).   If you throw up or have loose bowel movements, you should drink more fluids so that you do not become dehydrated (lack of water in the body from losing too much fluid).   If you have diarrhea, eat low-fiber foods that are high in protein and calories and avoid foods that can irritate your digestive tracts or lead to cramping.   To help with nausea and vomiting, eat small, frequent meals instead of three large meals a day. Choose foods and drinks that are at room temperature. Ask your nurse or doctor about other helpful tips and medicine that is available to help stop or lessen these symptoms.   To help with decreased appetite, eat foods high in calories and protein, such as meat, poultry, fish, dry beans, tofu, eggs, nuts, milk, yogurt, cheese, ice cream, pudding, and nutritional supplements.   Consider  using sauces and spices to increase taste. Daily exercise, with your doctor's approval, may increase your appetite.   Keeping your pain under control is important to your well-being. Please tell  your doctor or nurse if you are experiencing pain.   If you get a rash do not put anything on it unless your doctor or nurse says you may. Keep the area around the rash clean and dry. Ask your doctor for medicine if your rash bothers you.   Keeping your nails moisturized may help with brittleness.   To help with hair loss, wash with a mild shampoo and avoid washing your hair every day.   Avoid rubbing your scalp, pat your hair or scalp dry.   Avoid coloring your hair.   Limit your use of hair spray, electric curlers, blow dryers, and curling irons.   If you are interested in getting a wig, talk to your nurse. You can also call the [facility] at [phone] to find out information about the "Look Good, Feel Better" program close to where you live. It is a free program where women getting chemotherapy can learn about wigs, turbans and scarves as well as makeup techniques and skin and nail care.   If you have numbness and tingling in your hands and feet, be careful when cooking, walking, and handling sharp objects and hot liquids.  Food and Drug Interactions  There are no known interactions of docetaxel with food.   This drug may interact with other medicines. Tell your doctor and pharmacist about all the prescription and over-the-counter medicines and dietary supplements (vitamins, minerals, herbs and others) that you are taking at this time. Also, check with your doctor or pharmacist before starting any new prescription or over-the-counter medicines, or dietary supplements to make sure that there are no interactions.  When to Call the Doctor Call your doctor or nurse if you have any of these symptoms and/or any new or unusual symptoms:   Fever of 100.4 F (38 C) or higher    Chills   Blurred vision or other changes in eyesight   Easy bruising or bleeding   Wheezing or trouble breathing   Chest pain   Feeling dizzy or lightheaded   Tiredness that interferes with your daily activities   Pain in your mouth or throat that makes it hard to eat or drink   Nausea that stops you from eating or drinking and/or is not relieved by prescribed medicines   Throwing up   Lasting loss of appetite or rapid weight loss of five pounds in a week   Diarrhea, 4 times in one day or diarrhea with lack of strength or a feeling of being dizzy   No bowel movement in 3 days or when you feel uncomfortable   Severe abdominal pain that does not go away   Blood in your stool   Numbness, tingling, or pain in your hands and feet   Swelling of legs, ankles, or feet   Weight gain of 5 pounds in one week (fluid retention)   Extreme weakness that interferes with normal activities   New rash and/or itching   Rash that is not relieved by prescribed medicines   Signs of inflammation/infection (redness, swelling, pain) of the tissue around your nails.   Signs of allergic reaction: swelling of the face, feeling like your tongue or throat are swelling, trouble breathing, rash, itching, fever, chills, feeling dizzy, and/or feeling that your heart is beating in a fast or not normal way. If this happens, call 911 for emergency care.   Flu-like symptoms: fever, headache, muscle and joint aches, and fatigue (low energy, feeling weak)   Signs of possible liver problems:  dark urine, pale bowel movements, bad stomach pain, feeling very tired and weak, unusual itching, or yellowing of the eyes or skin   Symptoms of being drunk, confusion, or being very sleepy   Confusion or agitation, decreased urine, nausea/vomiting, diarrhea, muscle cramping, numbness and/or tingling, seizures   General pain that does not go away or is not relieved by prescribed medicine   If you think you may be  pregnant or have impregnated your partner  Reproduction Warnings  Pregnancy warning: This drug can have harmful effects on the unborn baby. Women of childbearing potential should use effective methods of birth control during your cancer treatment and for 6 months after treatment. Men with female partners of childbearing potential should use effective methods of birth control during your cancer treatment and for 3 months after your cancer treatment. Let your doctor know right away if you think you may be pregnant or may have impregnated your partner.   Breastfeeding warning: Women should not breastfeed during treatment and for 1 week after treatment because this drug could enter the breast milk and cause harm to a breastfeeding baby.    Fertility warning: In men, this drug may affect your ability to have children in the future. Talk with your doctor or nurse if you plan to have children. Ask for information on sperm banking.    SELF CARE ACTIVITIES WHILE RECEIVING CHEMOTHERAPY:  Hydration Increase your fluid intake 48 hours prior to treatment and drink at least 8 to 12 cups (64 ounces) of water/decaffeinated beverages per day after treatment. You can still have your cup of coffee or soda but these beverages do not count as part of your 8 to 12 cups that you need to drink daily. No alcohol intake.  Medications Continue taking your normal prescription medication as prescribed.  If you start any new herbal or new supplements please let us know first to make sure it is safe.  Mouth Care Have teeth cleaned professionally before starting treatment. Keep dentures and partial plates clean. Use soft toothbrush and do not use mouthwashes that contain alcohol. Biotene is a good mouthwash that is available at most pharmacies or may be ordered by calling [phone]. Use warm salt water gargles (1 teaspoon salt per 1 quart warm water) before and after meals and at bedtime. If you need dental work, please  let the doctor know before you go for your appointment so that we can coordinate the best possible time for you in regards to your chemo regimen. You need to also let your dentist know that you are actively taking chemo. We may need to do labs prior to your dental appointment.  Skin Care Always use sunscreen that has not expired and with SPF (Sun Protection Factor) of 50 or higher. Wear hats to protect your head from the sun. Remember to use sunscreen on your hands, ears, face, & feet.  Use good moisturizing lotions such as udder cream, eucerin, or even Vaseline. Some chemotherapies can cause dry skin, color changes in your skin and nails.    Avoid long, hot showers or baths. Use gentle, fragrance-free soaps and laundry detergent. Use moisturizers, preferably creams or ointments rather than lotions because the thicker consistency is better at preventing skin dehydration. Apply the cream or ointment within 15 minutes of showering. Reapply moisturizer at night, and moisturize your hands every time after you wash them.  Hair Loss (if your doctor says your hair will fall out)  If your doctor says that your hair  is likely to fall out, decide before you begin chemo whether you want to wear a wig. You may want to shop before treatment to match your hair color. Hats, turbans, and scarves can also camouflage hair loss, although some people prefer to leave their heads uncovered. If you go bare-headed outdoors, be sure to use sunscreen on your scalp. Cut your hair short. It eases the inconvenience of shedding lots of hair, but it also can reduce the emotional impact of watching your hair fall out. Don't perm or color your hair during chemotherapy. Those chemical treatments are already damaging to hair and can enhance hair loss. Once your chemo treatments are done and your hair has grown back, it's OK to resume dyeing or perming hair.  With chemotherapy, hair loss is almost always temporary. But when it grows  back, it may be a different color or texture. In older adults who still had hair color before chemotherapy, the new growth may be completely gray.  Often, new hair is very fine and soft.  Infection Prevention Please wash your hands for at least 30 seconds using warm soapy water. Handwashing is the #1 way to prevent the spread of germs. Stay away from sick people or people who are getting over a cold. If you develop respiratory systems such as green/yellow mucus production or productive cough or persistent cough let us know and we will see if you need an antibiotic. It is a good idea to keep a pair of gloves on when going into grocery stores/[employer] to decrease your risk of coming into contact with germs on the carts, etc. Carry alcohol hand gel with you at all times and use it frequently if out in public. If your temperature reaches 100.5 or higher please call the clinic and let us know.  If it is after hours or on the weekend please go to the ER if your temperature is over 100.5.  Please have your own personal thermometer at home to use.    Sex and bodily fluids If you are going to have sex, a condom must be used to protect the person that isn't taking chemotherapy. Chemo can decrease your libido (sex drive). For a few days after chemotherapy, chemotherapy can be excreted through your bodily fluids.  When using the toilet please close the lid and flush the toilet twice.  Do this for a few day after you have had chemotherapy.   Effects of chemotherapy on your sex life Some changes are simple and won't last long. They won't affect your sex life permanently.  Sometimes you may feel: too tired not strong enough to be very active sick or sore  not in the mood anxious or low Your anxiety might not seem related to sex. For example, you may be worried about the cancer and how your treatment is going. Or you may be worried about money, or about how you family are coping with your illness.  These things can  cause stress, which can affect your interest in sex. It's important to talk to your partner about how you feel.  Remember - the changes to your sex life don't usually last long. There's usually no medical reason to stop having sex during chemo. The drugs won't have any long term physical effects on your performance or enjoyment of sex. Cancer can't be passed on to your partner during sex  Contraception It's important to use reliable contraception during treatment. Avoid getting pregnant while you or your partner are having chemotherapy. This  is because the drugs may harm the baby. Sometimes chemotherapy drugs can leave a man or woman infertile.  This means you would not be able to have children in the future. You might want to talk to someone about permanent infertility. It can be very difficult to learn that you may no longer be able to have children. Some people find counselling helpful. There might be ways to preserve your fertility, although this is easier for men than for women. You may want to speak to a fertility expert. You can talk about sperm banking or harvesting your eggs. You can also ask about other fertility options, such as donor eggs. If you have or have had breast cancer, your doctor might advise you not to take the contraceptive pill. This is because the hormones in it might affect the cancer. It is not known for sure whether or not chemotherapy drugs can be passed on through semen or secretions from the vagina. Because of this some doctors advise people to use a barrier method if you have sex during treatment. This applies to vaginal, anal or oral sex. Generally, doctors advise a barrier method only for the time you are actually having the treatment and for about a week after your treatment. Advice like this can be worrying, but this does not mean that you have to avoid being intimate with your partner. You can still have close contact with your partner and continue to enjoy  sex.  Animals If you have cats or birds we just ask that you not change the litter or change the cage.  Please have someone else do this for you while you are on chemotherapy.   Food Safety During and After Cancer Treatment Food safety is important for people both during and after cancer treatment. Cancer and cancer treatments, such as chemotherapy, radiation therapy, and stem cell/bone marrow transplantation, often weaken the immune system. This makes it harder for your body to protect itself from foodborne illness, also called food poisoning. Foodborne illness is caused by eating food that contains harmful bacteria, parasites, or viruses.  Foods to avoid Some foods have a higher risk of becoming tainted with bacteria. These include: Unwashed fresh fruit and vegetables, especially leafy vegetables that can hide dirt and other contaminants Raw sprouts, such as alfalfa sprouts Raw or undercooked beef, especially ground beef, or other raw or undercooked meat and poultry Fatty, fried, or spicy foods immediately before or after treatment.  These can sit heavy on your stomach and make you feel nauseous. Raw or undercooked shellfish, such as oysters. Sushi and sashimi, which often contain raw fish.  Unpasteurized beverages, such as unpasteurized fruit juices, raw milk, raw yogurt, or cider Undercooked eggs, such as soft boiled, over easy, and poached; raw, unpasteurized eggs; or foods made with raw egg, such as homemade raw cookie dough and homemade mayonnaise  Simple steps for food safety  Shop smart. Do not buy food stored or displayed in an unclean area. Do not buy bruised or damaged fruits or vegetables. Do not buy cans that have cracks, dents, or bulges. Pick up foods that can spoil at the end of your shopping trip and store them in a cooler on the way home.  Prepare and clean up foods carefully. Rinse all fresh fruits and vegetables under running water, and dry them with a clean towel or  paper towel. Clean the top of cans before opening them. After preparing food, wash your hands for 20 seconds with hot water and soap.  Pay special attention to areas between fingers and under nails. Clean your utensils and dishes with hot water and soap. Disinfect your kitchen and cutting boards using 1 teaspoon of liquid, unscented bleach mixed into 1 quart of water.    Dispose of old food. Eat canned and packaged food before its expiration date (the "use by" or "best before" date). Consume refrigerated leftovers within 3 to 4 days. After that time, throw out the food. Even if the food does not smell or look spoiled, it still may be unsafe. Some bacteria, such as Listeria, can grow even on foods stored in the refrigerator if they are kept for too long.  Take precautions when eating out. At restaurants, avoid buffets and salad bars where food sits out for a long time and comes in contact with many people. Food can become contaminated when someone with a virus, often a norovirus, or another "bug" handles it. Put any leftover food in a "to-go" container yourself, rather than having the server do it. And, refrigerate leftovers as soon as you get home. Choose restaurants that are clean and that are willing to prepare your food as you order it cooked.   AT HOME MEDICATIONS:                                                                                                                                                                Compazine/Prochlorperazine 10mg  tablet. Take 1 tablet every 6 hours as needed for nausea/vomiting. (This can make you sleepy)   EMLA cream. Apply a quarter size amount to port site 1 hour prior to chemo. Do not rub in. Cover with plastic wrap.    Diarrhea Sheet   If you are having loose stools/diarrhea, please purchase Imodium and begin taking as outlined:  At the first sign of poorly formed or loose stools you should begin taking Imodium (loperamide) 2 mg capsules.  Take two tablets (4mg ) followed by one tablet (2mg ) every 2 hours - DO NOT EXCEED 8 tablets in 24 hours.  If it is bedtime and you are having loose stools, take 2 tablets at bedtime, then 2 tablets every 4 hours until morning.   Always call the [facility] if you are having loose stools/diarrhea that you can't get under control.  Loose stools/diarrhea leads to dehydration (loss of water) in your body.  We have other options of trying to get the loose stools/diarrhea to stop but you must let us know!   Constipation Sheet  Colace - 100 mg capsules - take 2 capsules daily.  If this doesn't help then you can increase to 2 capsules twice daily.  Please call if the above does not work for you. Do not go more than 2 days without a bowel movement.  It is  very important that you do not become constipated.  It will make you feel sick to your stomach (nausea) and can cause abdominal pain and vomiting.  Nausea Sheet   Compazine/Prochlorperazine 10mg  tablet. Take 1 tablet every 6 hours as needed for nausea/vomiting (This can make you drowsy).  If you are having persistent nausea (nausea that does not stop) please call the [facility] and let us know the amount of nausea that you are experiencing.  If you begin to vomit, you need to call the [facility] and if it is the weekend and you have vomited more than one time and can't get it to stop-go to the Emergency Room.  Persistent nausea/vomiting can lead to dehydration (loss of fluid in your body) and will make you feel very weak and unwell. Ice chips, sips of clear liquids, foods that are at room temperature, crackers, and toast tend to be better tolerated.   SYMPTOMS TO REPORT AS SOON AS POSSIBLE AFTER TREATMENT:  FEVER GREATER THAN 100.4 F  CHILLS WITH OR WITHOUT FEVER  NAUSEA AND VOMITING THAT IS NOT CONTROLLED WITH YOUR NAUSEA MEDICATION  UNUSUAL SHORTNESS OF BREATH  UNUSUAL BRUISING OR BLEEDING  TENDERNESS IN MOUTH AND THROAT WITH OR WITHOUT  PRESENCE OF ULCERS  URINARY PROBLEMS  BOWEL PROBLEMS  UNUSUAL RASH     Wear comfortable clothing and clothing appropriate for easy access to any Portacath or PICC line. Let us know if there is anything that we can do to make your therapy better!    What to do if you need assistance after hours or on the weekends: CALL [phone].  HOLD on the line, do not hang up.  You will hear multiple messages but at the end you will be connected with a nurse triage line.  They will contact the doctor if necessary.  Most of the time they will be able to assist you.  Do not call the hospital operator.      I have been informed and understand all of the instructions given to me and have received a copy. I have been instructed to call the clinic [phone] or my family physician as soon as possible for continued medical care, if indicated. I do not have any more questions at this time but understand that I may call the [facility] or the Patient Navigator at [phone] during office hours should I have questions or need assistance in obtaining follow-up care.

## 2021-05-13 ENCOUNTER — Encounter (HOSPITAL_COMMUNITY): Payer: Self-pay | Admitting: Hematology

## 2021-05-20 ENCOUNTER — Ambulatory Visit (HOSPITAL_COMMUNITY): Payer: Medicare HMO

## 2021-05-20 ENCOUNTER — Encounter (HOSPITAL_COMMUNITY): Payer: Self-pay | Admitting: Hematology

## 2021-05-20 ENCOUNTER — Encounter (HOSPITAL_COMMUNITY): Payer: Self-pay

## 2021-05-20 ENCOUNTER — Inpatient Hospital Stay (HOSPITAL_COMMUNITY): Payer: Medicare HMO

## 2021-05-20 NOTE — Patient Instructions (Addendum)
[facility] Chemotherapy Teaching   You are diagnosed with Stage I left breast invasive ductal carcinoma, estrogen receptor positive.  You will be treated in the clinic every 3 weeks for a total of 4 cycles with a combination of chemotherapy drugs.  Those drugs are docetaxel (Taxotere) and cyclophosphamide (Cytoxan). The intent of treatment is cure.  You will see the doctor regularly throughout treatment.  We will obtain blood work from you prior to every treatment and monitor your results to make sure it is safe to give your treatment. The doctor monitors your response to treatment by the way you are feeling, your blood work, and by obtaining scans periodically.  There will be wait times while you are here for treatment.  It will take about 30 minutes to 1 hour for your lab work to result.  Then there will be wait times while pharmacy mixes your medications.    Medications you will receive in the clinic prior to your chemotherapy medications:  Aloxi:  ALOXI is used in adults to help prevent the nausea and vomiting that happens with certain chemotherapy drugs.  Aloxi is a long acting medication, and will remain in your system for about.   Dexamethasone:  This is a steroid given prior to chemotherapy to help prevent allergic reactions; it may also help prevent and control nausea and diarrhea.    Neulasta (or similar) - this medication is not chemotherapy but is being given because you have had chemo. It is usually given 24-48 hours after the completion of chemotherapy. This medication works by boosting your bone marrow's supply of white blood cells. White blood cells are what protect our bodies against infection. The medication is given in the form of a subcutaneous injection. It is given in the fatty tissue of your abdomen, or in the skin on the back of your arm . It is a short needle. The major side effect of this medication is bone or muscle pain. The drug of choice to relieve or lessen  the pain is Aleve or Ibuprofen. If a physician has ever told you not to take Aleve or Ibuprofen - then don't take it. You should then take Tylenol/acetaminophen. Take either medication as the bottle directs you to.  The level of pain you experience as a result of this injection can range from none, to mild or moderate, or severe. Please let us know if you develop moderate or severe bone pain.   You can take Claritin 10 mg over the counter for a few days after receiving Neulasta to help with the bone aches and pains.       Docetaxel (Taxotere)  About This Drug  Docetaxel is used to treat cancer. It is given in the vein (IV) through your port a cath.  It will take 1 hour to infuse. Your first infusion will take longer than 1 hour due to the fact that we start it at a very slow rate and gradually increase the rate until the maximum infusion rate is reached.  This is done in order to monitor you closely for allergic/infusion reactions.  Your nurse will remain in the room with you for the first 15 minutes of this infusion on your first time getting it.  If you tolerate the first infusion without adverse reactions, going forward we will give you this drug at the normal infusion rate over 1 hour.   Possible Side Effects  Bone marrow suppression. This is a decrease in the number of white  blood cells, red blood cells, and platelets. This may raise your risk of infection, make you tired and weak (fatigue), and raise your risk of bleeding.   Fever in the setting of decreased white blood cells, which is a serious condition that can be lifethreatening   Soreness of the mouth and throat. You may have red areas, white patches, or sores that hurt.   Nausea and vomiting (throwing up)   Constipation (not able to move bowels)   Diarrhea (loose bowel movements)   Infections   Swelling of your legs, ankles and/or feet, or fluid build-up around your lungs, heart or elsewhere    Changes in the way food and drinks  taste   Effects on the nerves are called peripheral neuropathy. You may feel numbness, tingling, or pain in your hands and feet. It may be hard for you to button your clothes, open jars, or walk as usual. The effect on the nerves may get worse with more doses of the drug. These effects get better in some people after the drug is stopped but it does not get better in all people.   Decreased appetite (decreased hunger)   Weakness   Pain   Muscle pain/aching   Trouble breathing   Changes in your nail color, you may have nail loss and/or brittle nail    Hair loss. Hair loss is often temporary, although there have been cases of permanent hair loss reported. Hair loss may happen suddenly or gradually. If you lose hair, you may lose it from your head, face, armpits, pubic area, chest, and/or legs. You may also notice your hair getting thin.   Allergic skin reaction. You may develop blisters on your skin that are filled with fluid or a severe red rash all over your body that may be painful.   Allergic reactions, including anaphylaxis are rare but may happen in some patients. Signs of allergic reaction to this drug may be swelling of the face, feeling like your tongue or throat are swelling, trouble breathing, rash, itching, fever, chills, feeling dizzy, and/or feeling that your heart is beating in a fast or not normal way. If this happens, do not take another dose of this drug. You should get urgent medical treatment.  Note: Not all possible side effects are included above.  Warnings and Precautions  Severe bone marrow suppression, including febrile neutropenia - fever in the setting of decreased white blood cells, which may be life threatening.   Severe allergic reactions, including anaphylaxis which can be life-threatening   Swelling (inflammation) in the colon in the setting of severely low white blood cells, which raises your risk of infection and can be life-threatening   Severe skin  reactions, including redness, swelling or peeling of skin   Severe swelling in the eye or other changes in eyesight   Severe swelling of your legs, ankles and/or feet. Sometimes, fluid can build up in your lungs and/or around your heart causing you trouble breathing.   If you have a history of abnormal liver function, receive high doses of docetaxel, or have a history of lung cancer and have received treatment with a platinum (type of chemotherapy medication), you have an increased risk of death.   Severe weakness   This drug may raise your risk of getting a second cancer such as leukemia and myelodysplastic syndrome.   Severe peripheral neuropathy - numbness, tingling, or pain in your hands and feet  This drug contains alcohol and may affect your central nervous system.  The central nervous system is made up of your brain and spinal cord. You may feel drunk during and after your treatment and it can impair your ability to drive or use machinery for one to two hours after infusion.    Tumor lysis syndrome: This drug may act on the cancer cells very quickly. This may affect how your kidneys work.  Note: Some of the side effects above are very rare. If you have concerns and/or questions, please discuss them with your medical team.  Important Information  This drug may be present in the saliva, tears, sweat, urine, stool, vomit, semen, and vaginal secretions. Talk to your doctor and/or your nurse about the necessary precautions to take during this time.  Treating Side Effects  Manage tiredness by pacing your activities for the day.   Be sure to include periods of rest between energy-draining activities.   Get regular exercise. If you feel too tired to exercise vigorously, try taking a short walk.   To decrease the risk of infection, wash your hands regularly.   Avoid close contact with people who have a cold, the flu, or other infections.   Take your temperature as your doctor or nurse  tells you, and whenever you feel like you may have a fever.   To help decrease the risk of bleeding, use a soft toothbrush. Check with your nurse before using dental floss.   Be very careful when using knives or tools.   Use an electric shaver instead of a razor.   Mouth care is very important and will help food taste better and improve your appetite. Your mouth care should consist of routine, gentle cleaning of your teeth or dentures and rinsing your mouth with a mixture of 1/2 teaspoon of salt in 8 ounces of water or 1/2 teaspoon of baking soda in 8 ounces of water. This should be done at least after each meal and at bedtime.   If you have mouth sores, avoid mouthwash that has alcohol. Also avoid alcohol and smoking because they can bother your mouth and throat.   Ask your doctor or nurse about medicines that are available to help stop or lessen constipation and/or diarrhea.   If you are not able to move your bowels, check with your doctor or nurse before you use enemas, laxatives, or suppositories.   Drink plenty of fluids (a minimum of eight glasses per day - 64 oz -  is recommended).   If you throw up or have loose bowel movements, you should drink more fluids so that you do not become dehydrated (lack of water in the body from losing too much fluid).   If you have diarrhea, eat low-fiber foods that are high in protein and calories and avoid foods that can irritate your digestive tracts or lead to cramping.   To help with nausea and vomiting, eat small, frequent meals instead of three large meals a day. Choose foods and drinks that are at room temperature. Ask your nurse or doctor about other helpful tips and medicine that is available to help stop or lessen these symptoms.   To help with decreased appetite, eat foods high in calories and protein, such as meat, poultry, fish, dry beans, tofu, eggs, nuts, milk, yogurt, cheese, ice cream, pudding, and nutritional supplements.   Consider  using sauces and spices to increase taste. Daily exercise, with your doctor's approval, may increase your appetite.   Keeping your pain under control is important to your well-being. Please tell  your doctor or nurse if you are experiencing pain.   If you get a rash do not put anything on it unless your doctor or nurse says you may. Keep the area around the rash clean and dry. Ask your doctor for medicine if your rash bothers you.   Keeping your nails moisturized may help with brittleness.   To help with hair loss, wash with a mild shampoo and avoid washing your hair every day.   Avoid rubbing your scalp, pat your hair or scalp dry.   Avoid coloring your hair.   Limit your use of hair spray, electric curlers, blow dryers, and curling irons.   If you are interested in getting a wig, talk to your nurse. You can also call the [facility] at [phone]-2345 to find out information about the "Look Good, Feel Better" program close to where you live. It is a free program where women getting chemotherapy can learn about wigs, turbans and scarves as well as makeup techniques and skin and nail care.   If you have numbness and tingling in your hands and feet, be careful when cooking, walking, and handling sharp objects and hot liquids.  Food and Drug Interactions  There are no known interactions of docetaxel with food.   This drug may interact with other medicines. Tell your doctor and pharmacist about all the prescription and over-the-counter medicines and dietary supplements (vitamins, minerals, herbs and others) that you are taking at this time. Also, check with your doctor or pharmacist before starting any new prescription or over-the-counter medicines, or dietary supplements to make sure that there are no interactions.  When to Call the Doctor Call your doctor or nurse if you have any of these symptoms and/or any new or unusual symptoms:   Fever of 100.4 F (38 C) or higher    Chills   Blurred vision or other changes in eyesight   Easy bruising or bleeding   Wheezing or trouble breathing   Chest pain   Feeling dizzy or lightheaded   Tiredness that interferes with your daily activities   Pain in your mouth or throat that makes it hard to eat or drink   Nausea that stops you from eating or drinking and/or is not relieved by prescribed medicines   Throwing up   Lasting loss of appetite or rapid weight loss of five pounds in a week   Diarrhea, 4 times in one day or diarrhea with lack of strength or a feeling of being dizzy   No bowel movement in 3 days or when you feel uncomfortable   Severe abdominal pain that does not go away   Blood in your stool   Numbness, tingling, or pain in your hands and feet   Swelling of legs, ankles, or feet   Weight gain of 5 pounds in one week (fluid retention)   Extreme weakness that interferes with normal activities   New rash and/or itching   Rash that is not relieved by prescribed medicines   Signs of inflammation/infection (redness, swelling, pain) of the tissue around your nails.   Signs of allergic reaction: swelling of the face, feeling like your tongue or throat are swelling, trouble breathing, rash, itching, fever, chills, feeling dizzy, and/or feeling that your heart is beating in a fast or not normal way. If this happens, call 911 for emergency care.   Flu-like symptoms: fever, headache, muscle and joint aches, and fatigue (low energy, feeling weak)   Signs of possible liver problems:  dark urine, pale bowel movements, bad stomach pain, feeling very tired and weak, unusual itching, or yellowing of the eyes or skin   Symptoms of being drunk, confusion, or being very sleepy   Confusion or agitation, decreased urine, nausea/vomiting, diarrhea, muscle cramping, numbness and/or tingling, seizures   General pain that does not go away or is not relieved by prescribed medicine   If you think you may be  pregnant or have impregnated your partner  Reproduction Warnings  Pregnancy warning: This drug can have harmful effects on the unborn baby. Women of childbearing potential should use effective methods of birth control during your cancer treatment and for 6 months after treatment. Men with female partners of childbearing potential should use effective methods of birth control during your cancer treatment and for 3 months after your cancer treatment. Let your doctor know right away if you think you may be pregnant or may have impregnated your partner.   Breastfeeding warning: Women should not breastfeed during treatment and for 1 week after treatment because this drug could enter the breast milk and cause harm to a breastfeeding baby.    Fertility warning: In men, this drug may affect your ability to have children in the future. Talk with your doctor or nurse if you plan to have children. Ask for information on sperm banking.   Cyclophosphamide   About This Drug   Cyclophosphamide is used to treat cancer. It is given orally (by mouth) and in the vein (IV). It will take 30 minutes to infuse.  Possible Side Effects   Decrease in the number of white blood cells. This may raise your risk of infection.    Fever and neutropenic fever. A type of fever that can develop when you have a very low number of white blood cells which can be life-threatening.    Nausea and vomiting (throwing up)    Diarrhea (loose bowel movements)    Hair loss. Hair loss is often temporary, although with certain medicine, hair loss can sometimes be permanent. Hair loss may happen suddenly or gradually. If you lose hair, you may lose it from your head, face, armpits, pubic area, chest, and/or legs. You may also notice your hair getting thin.   Note: Not all possible side effects are included above.   Warnings and Precautions    Severe bone marrow suppression, which can be life-threatening. This is a decrease in the  number of white blood cells, red blood cells, and platelets. This may raise your risk of infection, make you tired and weak, and raise your risk of bleeding.    Abnormal heartbeat and/or heart changes such as inflammation (swelling) in the tissue of the heart and/or congestive heart failure - your heart is not pumping blood as well as it should be, and fluid can build up in your body.    Effects on the bladder and kidneys that may be life-threatening. This drug may cause inflammation (swelling), irritation and bleeding in the bladder and/or kidneys. You may have blood in your urine.  Changes in your liver function and blockage of small veins in the liver, which can cause liver failure and be life-threatening.    Inflammation (swelling) and/or thickening of the lungs, and changes to the small vessels of your lungs. You may have a dry cough or trouble breathing.    This drug may cause slow wound healing.    A severe decreased level of sodium in your blood, which can be life-threatening.    This  drug may raise your risk of getting a second cancer.     These side effects may be more severe if you are receiving high doses of this medication included in pre-transplant chemotherapy.   Note: Some of the side effects above are very rare. If you have concerns and/or questions, please discuss them with your medical team.   Important Information    Your doctor may recommend that you drink extra fluids during or after your treatment to flush your bladder and urinate often to help decrease the risk of the effects on your bladder.    Cyclophosphamide may cause slow wound healing. If you must have emergency surgery or have an accident that results in a wound, tell the doctor that you are on cyclophosphamide.    This drug may impair your ability to drive or use machinery. Use caution and talk your doctor and/or nurse about any precautions you may need to take.    This drug may be present in the saliva,  tears, sweat, urine, stool, vomit, semen, and vaginal secretions. Talk to your doctor and/or your nurse about the necessary precautions to take during this time.   Treating Side Effects    Manage tiredness by pacing your activities for the day.    Be sure to include periods of rest between energy-draining activities.    To decrease the risk of infection, wash your hands regularly.    Avoid close contact with people who have a cold, the flu, or other infections.    Take your temperature as your doctor or nurse tells you, and whenever you feel like you may have a fever.    To help decrease the risk of bleeding, use a soft toothbrush. Check with your nurse before using dental floss.  Be very careful when using knives or tools.    Use an electric shaver instead of a razor.    Drink plenty of fluids (a minimum of eight glasses per day is recommended).    If you throw up or have diarrhea, you should drink more fluids so that you do not become dehydrated (lack of water in the body from losing too much fluid).    If you have diarrhea, eat low-fiber foods that are high in protein and calories and avoid foods that can irritate your digestive tracts or lead to cramping.    Ask your nurse or doctor about medicine that can lessen or stop your diarrhea.    To help with nausea and vomiting, eat small, frequent meals instead of three large meals a day. Choose foods and drinks that are at room temperature. Ask your nurse or doctor about other helpful tips and medicine that is available to help stop or lessen these symptoms.    To help with hair loss, wash with a mild shampoo and avoid washing your hair every day. Avoid coloring your hair.    Avoid rubbing your scalp, pat your hair or scalp dry. Limit your use of hair spray, electric curlers, blow dryers, and curling irons.    If you are interested in getting a wig, talk to your nurse and they can help you get in touch with programs in your local area.    Food and Drug Interactions    There are no known interactions of cyclophosphamide with food.    Check with your doctor or pharmacist about all other prescription medicines and over-the-counter medicines and dietary supplements (vitamins, minerals, herbs, and others) you are taking before starting this medicine as there  are known drug interactions with cyclophosphamide. Also, check with your doctor or pharmacist before starting any new prescription or over-the-counter medicines, or dietary supplement to make sure that there are no interactions.    There are known interactions of cyclophosphamide with blood thinning medicine such as warfarin. Ask your doctor what precautions you should take.    When given IV, this drug may contain alcohol and may affect your central nervous system, which is made up of your brain and spinal cord. You may feel dizzy and very sleepy.   When to Call the Doctor   Call your doctor or nurse if you have any of these symptoms and/or any new or unusual symptoms:    Fever of 100.4 F (38 C) or higher    Chills    Tiredness that interferes with your daily activities    Feeling dizzy or lightheaded    Easy bleeding or bruising    Dry cough    Wheezing and/or trouble breathing   Swelling of the hands, feet, or any other part of the body    Weight gain of 5 pounds in one week (fluid retention)  Feeling that your heart is beating in a fast or not normal way (palpitations)    Pain in your chest    Nausea that stops you from eating or drinking and/or is not relieved by prescribed medicines    Throwing up more than 3 times a day    Diarrhea, 4 times in one day or diarrhea with lack of strength or a feeling of being dizzy    Decreased urine, very dark urine, or difficulty urinating    Pain when passing urine or blood in urine    Signs of possible liver problems: dark urine, pale bowel movements, pain in your abdomen, feeling very tired and weak, unusual  itching, or yellowing of the eyes or skin    Signs of possible severe low sodium levels: confusion, agitation, feeling that your heart is beating fast, passing out, seizure and/or coma    If you think you may be pregnant or may have impregnated your partner   Reproduction Warnings    Pregnancy warning: This drug can have harmful effects on the unborn baby. Women of childbearing potential should use highly effective methods of birth control during your cancer treatment and for up to 1 year after stopping treatment. Men with female partners who are pregnant or may become pregnant should use a condom during your cancer treatment and for 4 months after stopping treatment. Let your doctor know right away if you think you may be pregnant or may have impregnated your partner.    In women, menstrual bleeding may become irregular or stop while you are getting this drug. Do not assume that you cannot become pregnant if you do not have a menstrual period.    Breastfeeding warning: Women should not breastfeed during treatment and for 1 week after stopping treatment because this drug could enter the breast milk and cause harm to a breastfeeding baby.    Fertility warning: In men and women both, this drug may affect your ability to have children in the future. Talk with your doctor or nurse if you plan to have children. Ask for information on sperm or egg banking.    SELF CARE ACTIVITIES WHILE RECEIVING CHEMOTHERAPY:  Hydration Increase your fluid intake 48 hours prior to treatment and drink at least 8 to 12 cups (64 ounces) of water/decaffeinated beverages per day after treatment. You can still  have your cup of coffee or soda but these beverages do not count as part of your 8 to 12 cups that you need to drink daily. No alcohol intake.  Medications Continue taking your normal prescription medication as prescribed.  If you start any new herbal or new supplements please let us know first to make sure it is  safe.  Mouth Care Have teeth cleaned professionally before starting treatment. Keep dentures and partial plates clean. Use soft toothbrush and do not use mouthwashes that contain alcohol. Biotene is a good mouthwash that is available at most pharmacies or may be ordered by calling [phone]. Use warm salt water gargles (1 teaspoon salt per 1 quart warm water) before and after meals and at bedtime. If you need dental work, please let the doctor know before you go for your appointment so that we can coordinate the best possible time for you in regards to your chemo regimen. You need to also let your dentist know that you are actively taking chemo. We may need to do labs prior to your dental appointment.  Skin Care Always use sunscreen that has not expired and with SPF (Sun Protection Factor) of 50 or higher. Wear hats to protect your head from the sun. Remember to use sunscreen on your hands, ears, face, & feet.  Use good moisturizing lotions such as udder cream, eucerin, or even Vaseline. Some chemotherapies can cause dry skin, color changes in your skin and nails.    Avoid long, hot showers or baths. Use gentle, fragrance-free soaps and laundry detergent. Use moisturizers, preferably creams or ointments rather than lotions because the thicker consistency is better at preventing skin dehydration. Apply the cream or ointment within 15 minutes of showering. Reapply moisturizer at night, and moisturize your hands every time after you wash them.  Hair Loss (if your doctor says your hair will fall out)  If your doctor says that your hair is likely to fall out, decide before you begin chemo whether you want to wear a wig. You may want to shop before treatment to match your hair color. Hats, turbans, and scarves can also camouflage hair loss, although some people prefer to leave their heads uncovered. If you go bare-headed outdoors, be sure to use sunscreen on your scalp. Cut your hair short. It eases  the inconvenience of shedding lots of hair, but it also can reduce the emotional impact of watching your hair fall out. Don't perm or color your hair during chemotherapy. Those chemical treatments are already damaging to hair and can enhance hair loss. Once your chemo treatments are done and your hair has grown back, it's OK to resume dyeing or perming hair.  With chemotherapy, hair loss is almost always temporary. But when it grows back, it may be a different color or texture. In older adults who still had hair color before chemotherapy, the new growth may be completely gray.  Often, new hair is very fine and soft.  Infection Prevention Please wash your hands for at least 30 seconds using warm soapy water. Handwashing is the #1 way to prevent the spread of germs. Stay away from sick people or people who are getting over a cold. If you develop respiratory systems such as green/yellow mucus production or productive cough or persistent cough let us know and we will see if you need an antibiotic. It is a good idea to keep a pair of gloves on when going into grocery stores/[employer] to decrease your risk of coming into  contact with germs on the carts, etc. Carry alcohol hand gel with you at all times and use it frequently if out in public. If your temperature reaches 100.5 or higher please call the clinic and let us know.  If it is after hours or on the weekend please go to the ER if your temperature is over 100.5.  Please have your own personal thermometer at home to use.    Sex and bodily fluids If you are going to have sex, a condom must be used to protect the person that isn't taking chemotherapy. Chemo can decrease your libido (sex drive). For a few days after chemotherapy, chemotherapy can be excreted through your bodily fluids.  When using the toilet please close the lid and flush the toilet twice.  Do this for a few day after you have had chemotherapy.   Effects of chemotherapy on your sex life Some  changes are simple and won't last long. They won't affect your sex life permanently.  Sometimes you may feel: too tired not strong enough to be very active sick or sore  not in the mood anxious or low Your anxiety might not seem related to sex. For example, you may be worried about the cancer and how your treatment is going. Or you may be worried about money, or about how you family are coping with your illness.  These things can cause stress, which can affect your interest in sex. It's important to talk to your partner about how you feel.  Remember - the changes to your sex life don't usually last long. There's usually no medical reason to stop having sex during chemo. The drugs won't have any long term physical effects on your performance or enjoyment of sex. Cancer can't be passed on to your partner during sex  Contraception It's important to use reliable contraception during treatment. Avoid getting pregnant while you or your partner are having chemotherapy. This is because the drugs may harm the baby. Sometimes chemotherapy drugs can leave a man or woman infertile.  This means you would not be able to have children in the future. You might want to talk to someone about permanent infertility. It can be very difficult to learn that you may no longer be able to have children. Some people find counselling helpful. There might be ways to preserve your fertility, although this is easier for men than for women. You may want to speak to a fertility expert. You can talk about sperm banking or harvesting your eggs. You can also ask about other fertility options, such as donor eggs. If you have or have had breast cancer, your doctor might advise you not to take the contraceptive pill. This is because the hormones in it might affect the cancer. It is not known for sure whether or not chemotherapy drugs can be passed on through semen or secretions from the vagina. Because of this some doctors advise people to use  a barrier method if you have sex during treatment. This applies to vaginal, anal or oral sex. Generally, doctors advise a barrier method only for the time you are actually having the treatment and for about a week after your treatment. Advice like this can be worrying, but this does not mean that you have to avoid being intimate with your partner. You can still have close contact with your partner and continue to enjoy sex.  Animals If you have cats or birds we just ask that you not change the litter or change  the cage.  Please have someone else do this for you while you are on chemotherapy.   Food Safety During and After Cancer Treatment Food safety is important for people both during and after cancer treatment. Cancer and cancer treatments, such as chemotherapy, radiation therapy, and stem cell/bone marrow transplantation, often weaken the immune system. This makes it harder for your body to protect itself from foodborne illness, also called food poisoning. Foodborne illness is caused by eating food that contains harmful bacteria, parasites, or viruses.  Foods to avoid Some foods have a higher risk of becoming tainted with bacteria. These include: Unwashed fresh fruit and vegetables, especially leafy vegetables that can hide dirt and other contaminants Raw sprouts, such as alfalfa sprouts Raw or undercooked beef, especially ground beef, or other raw or undercooked meat and poultry Fatty, fried, or spicy foods immediately before or after treatment.  These can sit heavy on your stomach and make you feel nauseous. Raw or undercooked shellfish, such as oysters. Sushi and sashimi, which often contain raw fish.  Unpasteurized beverages, such as unpasteurized fruit juices, raw milk, raw yogurt, or cider Undercooked eggs, such as soft boiled, over easy, and poached; raw, unpasteurized eggs; or foods made with raw egg, such as homemade raw cookie dough and homemade mayonnaise  Simple steps for food  safety  Shop smart. Do not buy food stored or displayed in an unclean area. Do not buy bruised or damaged fruits or vegetables. Do not buy cans that have cracks, dents, or bulges. Pick up foods that can spoil at the end of your shopping trip and store them in a cooler on the way home.  Prepare and clean up foods carefully. Rinse all fresh fruits and vegetables under running water, and dry them with a clean towel or paper towel. Clean the top of cans before opening them. After preparing food, wash your hands for 20 seconds with hot water and soap. Pay special attention to areas between fingers and under nails. Clean your utensils and dishes with hot water and soap. Disinfect your kitchen and cutting boards using 1 teaspoon of liquid, unscented bleach mixed into 1 quart of water.    Dispose of old food. Eat canned and packaged food before its expiration date (the "use by" or "best before" date). Consume refrigerated leftovers within 3 to 4 days. After that time, throw out the food. Even if the food does not smell or look spoiled, it still may be unsafe. Some bacteria, such as Listeria, can grow even on foods stored in the refrigerator if they are kept for too long.  Take precautions when eating out. At restaurants, avoid buffets and salad bars where food sits out for a long time and comes in contact with many people. Food can become contaminated when someone with a virus, often a norovirus, or another "bug" handles it. Put any leftover food in a "to-go" container yourself, rather than having the server do it. And, refrigerate leftovers as soon as you get home. Choose restaurants that are clean and that are willing to prepare your food as you order it cooked.   AT HOME MEDICATIONS:  Compazine/Prochlorperazine 10mg  tablet. Take 1 tablet  every 6 hours as needed for nausea/vomiting. (This can make you sleepy)   EMLA cream. Apply a quarter size amount to port site 1 hour prior to chemo. Do not rub in. Cover with plastic wrap.    Diarrhea Sheet   If you are having loose stools/diarrhea, please purchase Imodium and begin taking as outlined:  At the first sign of poorly formed or loose stools you should begin taking Imodium (loperamide) 2 mg capsules. Take two tablets (4mg ) followed by one tablet (2mg ) every 2 hours - DO NOT EXCEED 8 tablets in 24 hours.  If it is bedtime and you are having loose stools, take 2 tablets at bedtime, then 2 tablets every 4 hours until morning.   Always call the [facility] if you are having loose stools/diarrhea that you can't get under control.  Loose stools/diarrhea leads to dehydration (loss of water) in your body.  We have other options of trying to get the loose stools/diarrhea to stop but you must let us know!   Constipation Sheet  Colace - 100 mg capsules - take 2 capsules daily.  If this doesn't help then you can increase to 2 capsules twice daily.  Please call if the above does not work for you. Do not go more than 2 days without a bowel movement.  It is very important that you do not become constipated.  It will make you feel sick to your stomach (nausea) and can cause abdominal pain and vomiting.  Nausea Sheet   Compazine/Prochlorperazine 10mg  tablet. Take 1 tablet every 6 hours as needed for nausea/vomiting (This can make you drowsy).  If you are having persistent nausea (nausea that does not stop) please call the [facility] and let us know the amount of nausea that you are experiencing.  If you begin to vomit, you need to call the [facility] and if it is the weekend and you have vomited more than one time and can't get it to stop-go to the Emergency Room.  Persistent nausea/vomiting can lead to dehydration (loss of fluid in your body) and will make you feel very weak and unwell.  Ice chips, sips of clear liquids, foods that are at room temperature, crackers, and toast tend to be better tolerated.   SYMPTOMS TO REPORT AS SOON AS POSSIBLE AFTER TREATMENT:  FEVER GREATER THAN 100.4 F  CHILLS WITH OR WITHOUT FEVER  NAUSEA AND VOMITING THAT IS NOT CONTROLLED WITH YOUR NAUSEA MEDICATION  UNUSUAL SHORTNESS OF BREATH  UNUSUAL BRUISING OR BLEEDING  TENDERNESS IN MOUTH AND THROAT WITH OR WITHOUT PRESENCE OF ULCERS  URINARY PROBLEMS  BOWEL PROBLEMS  UNUSUAL RASH     Wear comfortable clothing and clothing appropriate for easy access to any Portacath or PICC line. Let us know if there is anything that we can do to make your therapy better!    What to do if you need assistance after hours or on the weekends: CALL [phone].  HOLD on the line, do not hang up.  You will hear multiple messages but at the end you will be connected with a nurse triage line.  They will contact the doctor if necessary.  Most of the time they will be able to assist you.  Do not call the hospital operator.      I have been informed and understand all of the instructions given to me and have received a copy. I have been instructed to call the clinic [phone]  or my family physician as soon as possible for continued medical care, if indicated. I do not have any more questions at this time but understand that I may call the [facility] or the Patient Navigator at [phone] during office hours should I have questions or need assistance in obtaining follow-up care.

## 2021-05-21 ENCOUNTER — Ambulatory Visit (INDEPENDENT_AMBULATORY_CARE_PROVIDER_SITE_OTHER): Payer: Medicare HMO

## 2021-05-21 ENCOUNTER — Encounter (HOSPITAL_COMMUNITY): Payer: Self-pay | Admitting: Hematology

## 2021-05-21 VITALS — Ht 67.0 in | Wt 123.0 lb

## 2021-05-21 DIAGNOSIS — Z0000 Encounter for general adult medical examination without abnormal findings: Secondary | ICD-10-CM | POA: Diagnosis not present

## 2021-05-21 NOTE — Progress Notes (Signed)
Subjective:   Samantha Mcfarland is a 65 y.o. female who presents for an Initial Medicare Annual Wellness Visit.  Virtual Visit via Telephone Note  I connected with  Samantha Mcfarland on 05/21/21 at  8:15 AM EST by telephone and verified that I am speaking with the correct person using two identifiers.  Location: Patient: Home Provider: [facility] Persons participating in the virtual visit: patient/Nurse Health Advisor   I discussed the limitations, risks, security and privacy concerns of performing an evaluation and management service by telephone and the availability of in person appointments. The patient expressed understanding and agreed to proceed.  Interactive audio and video telecommunications were attempted between this nurse and patient, however failed, due to patient having technical difficulties OR patient did not have access to video capability.  We continued and completed visit with audio only.  Some vital signs may be absent or patient reported.   Samantha Schnitzer E Birl Lobello, LPN   Review of Systems     Cardiac Risk Factors include: advanced age (>42men, >39 women);dyslipidemia;hypertension;sedentary lifestyle     Objective:    Today's Vitals   05/21/21 0818 05/21/21 0820  Weight: 123 lb (55.8 kg)   Height: 5\' 7"  (1.702 m)   PainSc:  0-No pain   Body mass index is 19.26 kg/m.  Advanced Directives 05/21/2021 05/06/2021 04/22/2021 03/23/2021 03/19/2021  Does Patient Have a Medical Advance Directive? No No No No No  Would patient like information on creating a medical advance directive? No - Patient declined No - Patient declined No - Patient declined No - Patient declined No - Patient declined    Current Medications (verified) Outpatient Encounter Medications as of 05/21/2021  Medication Sig   acetaminophen (TYLENOL) 325 MG tablet Take 325 mg by mouth every 6 (six) hours as needed for moderate pain or headache.   atorvastatin (LIPITOR) 20 MG tablet Take 1 tablet (20 mg total) by mouth daily.    FLUoxetine (PROZAC) 20 MG capsule Take 1 capsule (20 mg total) by mouth daily.   lisinopril (ZESTRIL) 10 MG tablet Take 1 tablet (10 mg total) by mouth daily with lunch.   omeprazole (PRILOSEC) 20 MG capsule Take 1 capsule (20 mg total) by mouth daily as needed (indigestion/heartburn). (Patient taking differently: Take 20 mg by mouth daily.)   prochlorperazine (COMPAZINE) 10 MG tablet Take 1 tablet (10 mg total) by mouth every 6 (six) hours as needed (Nausea or vomiting).   vitamin B-12 (CYANOCOBALAMIN) 500 MCG tablet Take 500 mcg by mouth daily.   [START ON 05/28/2021] CYCLOPHOSPHAMIDE IV Inject into the vein every 21 ( twenty-one) days.   [START ON 05/28/2021] DOCEtaxel (TAXOTERE IV) Inject into the vein every 21 ( twenty-one) days.   lidocaine-prilocaine (EMLA) cream Apply a small amount to port a cath site (do not rub in) and cover with plastic wrap 1 hour prior to chemotherapy appointments   No facility-administered encounter medications on file as of 05/21/2021.    Allergies (verified) Penicillins   History: Past Medical History:  Diagnosis Date   Allergy    Anxiety    Breast cancer ([place])    Depression    GERD (gastroesophageal reflux disease)    Hyperlipidemia    Hypertension    Osteopenia 04/30/2021   Port-A-Cath in place 05/07/2021   Vitamin D insufficiency    Past Surgical History:  Procedure Laterality Date   MASTECTOMY W/ SENTINEL NODE BIOPSY Left 03/23/2021   Procedure: TOTAL MASTECTOMY WITH SENTINEL LYMPH NODE BIOPSY;  Surgeon: Virl Cagey, MD;  Location: [facility];  Service: General;  Laterality: Left;   NECK SURGERY     OOPHORECTOMY     SALPINGECTOMY     Family History  Problem Relation Age of Onset   Stroke Mother    Alcohol abuse Mother    Diabetes Father    Pancreatic cancer Father    Testicular cancer Father    Pancreatic disease Father    Breast cancer Maternal Aunt    Breast cancer Paternal Aunt    Breast cancer Maternal Grandmother    Colon  cancer Paternal Grandfather    Social History   Socioeconomic History   Marital status: Divorced    Spouse name: Not on file   Number of children: 0   Years of education: Not on file   Highest education level: Not on file  Occupational History   Occupation: retired  Tobacco Use   Smoking status: Former    Packs/day: 0.25    Types: Cigarettes   Smokeless tobacco: Never   Tobacco comments:    1 pack every 3-4 days  Vaping Use   Vaping Use: Never used  Substance and Sexual Activity   Alcohol use: Yes    Alcohol/week: 20.0 standard drinks    Types: 20 Cans of beer per week    Comment: 3-4 beers per day   Drug use: Never   Sexual activity: Not Currently    Birth control/protection: Post-menopausal  Other Topics Concern   Not on file  Social History Narrative   Sister lives about 15 minutes away   Social Determinants of Health   Financial Resource Strain: Low Risk    Difficulty of Paying Living Expenses: Not hard at all  Food Insecurity: No Food Insecurity   Worried About Charity fundraiser in the Last Year: Never true   Arboriculturist in the Last Year: Never true  Transportation Needs: No Transportation Needs   Lack of Transportation (Medical): No   Lack of Transportation (Non-Medical): No  Physical Activity: Insufficiently Active   Days of Exercise per Week: 5 days   Minutes of Exercise per Session: 20 min  Stress: Stress Concern Present   Feeling of Stress : To some extent  Social Connections: Socially Isolated   Frequency of Communication with Friends and Family: More than three times a week   Frequency of Social Gatherings with Friends and Family: More than three times a week   Attends Religious Services: Never   Marine scientist or Organizations: No   Attends Music therapist: Never   Marital Status: Divorced    Tobacco Counseling Counseling given: Not Answered Tobacco comments: 1 pack every 3-4 days   Clinical Intake:  Pre-visit  preparation completed: Yes  Pain : No/denies pain Pain Score: 0-No pain     BMI - recorded: 19.26 Nutritional Status: BMI of 19-24  Normal Nutritional Risks: None Diabetes: No  How often do you need to have someone help you when you read instructions, pamphlets, or other written materials from your doctor or pharmacy?: 1 - Never  Diabetic? no  Interpreter Needed?: No  Information entered by :: Samantha Bail, LPN   Activities of Daily Living In your present state of health, do you have any difficulty performing the following activities: 05/21/2021 03/23/2021  Hearing? N N  Vision? N Y  Difficulty concentrating or making decisions? N N  Walking or climbing stairs? Y N  Dressing or bathing? N N  Doing errands, shopping? Samantha Mcfarland  Preparing Food and eating ? N -  Using the Toilet? N -  In the past six months, have you accidently leaked urine? N -  Do you have problems with loss of bowel control? N -  Managing your Medications? N -  Managing your Finances? N -  Housekeeping or managing your Housekeeping? N -  Some recent data might be hidden    Patient Care Team: Loman Brooklyn, FNP as PCP - General (Family Medicine) Derek Jack, MD as Medical Oncologist (Medical Oncology) Brien Mates, RN as Oncology Nurse Navigator (Oncology)  Indicate any recent Medical Services you may have received from other than [facility] providers in the past year (date may be approximate).     Assessment:   This is a routine wellness examination for Samantha Mcfarland.  Hearing/Vision screen Hearing Screening - Comments:: Denies hearing difficulties Vision Screening - Comments:: Wears otc reading glasses prn - up to date with annual eye exams with [facility]  Dietary issues and exercise activities discussed: Current Exercise Habits: Home exercise routine, Type of exercise: walking;stretching, Time (Minutes): 20, Frequency (Times/Week): 5, Weekly Exercise (Minutes/Week): 100, Intensity:  Mild, Exercise limited by: orthopedic condition(s);neurologic condition(s)   Goals Addressed             This Visit's Progress    Exercise 150 min/wk Moderate Activity         Depression Screen PHQ 2/9 Scores 05/01/2021  PHQ - 2 Score 2  PHQ- 9 Score 10    Fall Risk Fall Risk  05/21/2021 05/01/2021 03/05/2021  Falls in the past year? 0 0 0  Number falls in past yr: 0 - -  Injury with Fall? 0 - -  Risk for fall due to : Orthopedic patient;Impaired balance/gait - -  Follow up Education provided;Falls prevention discussed - Falls evaluation completed    FALL RISK PREVENTION PERTAINING TO THE HOME:  Any stairs in or around the home? Yes  If so, are there any without handrails? No  Home free of loose throw rugs in walkways, pet beds, electrical cords, etc? Yes  Adequate lighting in your home to reduce risk of falls? Yes   ASSISTIVE DEVICES UTILIZED TO PREVENT FALLS:  Life alert? No  Use of a cane, walker or w/c? Yes  Grab bars in the bathroom? No  Shower chair or bench in shower? No  Elevated toilet seat or a handicapped toilet? No   TIMED UP AND GO:  Was the test performed? No . Telephonic visit  Cognitive Function:     6CIT Screen 05/21/2021  What month? 0 points  What time? 0 points  Count back from 20 0 points  Months in reverse 0 points  Repeat phrase 2 points    Immunizations Immunization History  Administered Date(s) Administered   Fluad Quad(high Dose 65+) 05/01/2021   Moderna Sars-Covid-2 Vaccination 11/20/2019, 04/18/2020    TDAP status: Due, Education has been provided regarding the importance of this vaccine. Advised may receive this vaccine at local pharmacy or Health Dept. Aware to provide a copy of the vaccination record if obtained from local pharmacy or Health Dept. Verbalized acceptance and understanding.  Flu Vaccine status: Up to date  Pneumococcal vaccine status: Due, Education has been provided regarding the importance of this vaccine.  Advised may receive this vaccine at local pharmacy or Health Dept. Aware to provide a copy of the vaccination record if obtained from local pharmacy or Health Dept. Verbalized acceptance and understanding.  Covid-19 vaccine status: Completed  vaccines  Qualifies for Shingles Vaccine? Yes   Zostavax completed No   Shingrix Completed?: No.    Education has been provided regarding the importance of this vaccine. Patient has been advised to call insurance company to determine out of pocket expense if they have not yet received this vaccine. Advised may also receive vaccine at local pharmacy or Health Dept. Verbalized acceptance and understanding.  Screening Tests Health Maintenance  Topic Date Due   Pneumonia Vaccine 37+ Years old (1 - PCV) Never done   Hepatitis C Screening  Never done   TETANUS/TDAP  Never done   Zoster Vaccines- Shingrix (1 of 2) Never done   PAP SMEAR-Modifier  Never done   COLONOSCOPY (Pts 45-31yrs Insurance coverage will need to be confirmed)  Never done   COVID-19 Vaccine (3 - Moderna risk series) 05/16/2020   MAMMOGRAM  02/18/2023   INFLUENZA VACCINE  Completed   DEXA SCAN  Completed   HIV Screening  Completed   HPV VACCINES  Aged Out    Health Maintenance  Health Maintenance Due  Topic Date Due   Pneumonia Vaccine 33+ Years old (1 - PCV) Never done   Hepatitis C Screening  Never done   TETANUS/TDAP  Never done   Zoster Vaccines- Shingrix (1 of 2) Never done   PAP SMEAR-Modifier  Never done   COLONOSCOPY (Pts 45-70yrs Insurance coverage will need to be confirmed)  Never done   COVID-19 Vaccine (3 - Moderna risk series) 05/16/2020    Colonoscopy due - will discuss with oncology  Mammogram status: Completed 02/17/2021. Repeat every year  Bone Density status: Completed 04/30/2021. Results reflect: Bone density results: OSTEOPENIA. Repeat every 2 years.  Lung Cancer Screening: (Low Dose CT Chest recommended if Age 59-80 years, 30 pack-year currently smoking  OR have quit w/in 15years.) does not qualify.   Additional Screening:  Hepatitis C Screening: does qualify; Due  Vision Screening: Recommended annual ophthalmology exams for early detection of glaucoma and other disorders of the eye. Is the patient up to date with their annual eye exam?  Yes  Who is the provider or what is the name of the office in which the patient attends annual eye exams? [facility] If pt is not established with a provider, would they like to be referred to a provider to establish care? No .   Dental Screening: Recommended annual dental exams for proper oral hygiene  Community Resource Referral / Chronic Care Management: CRR required this visit?  No   CCM required this visit?  No      Plan:     I have personally reviewed and noted the following in the patient's chart:   Medical and social history Use of alcohol, tobacco or illicit drugs  Current medications and supplements including opioid prescriptions. Patient is not currently taking opioid prescriptions. Functional ability and status Nutritional status Physical activity Advanced directives List of other physicians Hospitalizations, surgeries, and ER visits in previous 12 months Vitals Screenings to include cognitive, depression, and falls Referrals and appointments  In addition, I have reviewed and discussed with patient certain preventive protocols, quality metrics, and best practice recommendations. A written personalized care plan for preventive services as well as general preventive health recommendations were provided to patient.     Sandrea Hammond, LPN   [id number]   Nurse Notes: Her balance is getting worse - she would like to see if insurance will pay for a shower chair

## 2021-05-21 NOTE — Patient Instructions (Signed)
[name] , Thank you for taking time to come for your Medicare Wellness Visit. I appreciate your ongoing commitment to your health goals. Please review the following plan we discussed and let me know if I can assist you in the future.   Screening recommendations/referrals: Colonoscopy: Due Mammogram: Done 02/17/2021 - following up with Dr Delton Coombes Bone Density: Done 04/30/21 - Repeat every 2 years Recommended yearly ophthalmology/optometry visit for glaucoma screening and checkup Recommended yearly dental visit for hygiene and checkup  Vaccinations: Influenza vaccine: Done 05/01/2021 - Repeat annually Pneumococcal vaccine: Due Tdap vaccine: Due Shingles vaccine: Due   Covid-19:Done 11/20/2019 & 04/18/2020  Advanced directives: Advance directive discussed with you today. Even though you declined this today, please call our office should you change your mind, and we can give you the proper paperwork for you to fill out.   Conditions/risks identified: Aim for 30 minutes of exercise or brisk walking each day, drink 6-8 glasses of water and eat lots of fruits and vegetables.   Next appointment: Follow up in one year for your annual wellness visit    Preventive Care 65 Years and Older, Female Preventive care refers to lifestyle choices and visits with your health care provider that can promote health and wellness. What does preventive care include? A yearly physical exam. This is also called an annual well check. Dental exams once or twice a year. Routine eye exams. Ask your health care provider how often you should have your eyes checked. Personal lifestyle choices, including: Daily care of your teeth and gums. Regular physical activity. Eating a healthy diet. Avoiding tobacco and drug use. Limiting alcohol use. Practicing safe sex. Taking low-dose aspirin every day. Taking vitamin and mineral supplements as recommended by your health care provider. What happens during an annual well  check? The services and screenings done by your health care provider during your annual well check will depend on your age, overall health, lifestyle risk factors, and family history of disease. Counseling  Your health care provider may ask you questions about your: Alcohol use. Tobacco use. Drug use. Emotional well-being. Home and relationship well-being. Sexual activity. Eating habits. History of falls. Memory and ability to understand (cognition). Work and work Statistician. Reproductive health. Screening  You may have the following tests or measurements: Height, weight, and BMI. Blood pressure. Lipid and cholesterol levels. These may be checked every 5 years, or more frequently if you are over 67 years old. Skin check. Lung cancer screening. You may have this screening every year starting at age 18 if you have a 30-pack-year history of smoking and currently smoke or have quit within the past 15 years. Fecal occult blood test (FOBT) of the stool. You may have this test every year starting at age 49. Flexible sigmoidoscopy or colonoscopy. You may have a sigmoidoscopy every 5 years or a colonoscopy every 10 years starting at age 57. Hepatitis C blood test. Hepatitis B blood test. Sexually transmitted disease (STD) testing. Diabetes screening. This is done by checking your blood sugar (glucose) after you have not eaten for a while (fasting). You may have this done every 1-3 years. Bone density scan. This is done to screen for osteoporosis. You may have this done starting at age 44. Mammogram. This may be done every 1-2 years. Talk to your health care provider about how often you should have regular mammograms. Talk with your health care provider about your test results, treatment options, and if necessary, the need for more tests. Vaccines  Your  health care provider may recommend certain vaccines, such as: Influenza vaccine. This is recommended every year. Tetanus, diphtheria, and  acellular pertussis (Tdap, Td) vaccine. You may need a Td booster every 10 years. Zoster vaccine. You may need this after age 43. Pneumococcal 13-valent conjugate (PCV13) vaccine. One dose is recommended after age 44. Pneumococcal polysaccharide (PPSV23) vaccine. One dose is recommended after age 52. Talk to your health care provider about which screenings and vaccines you need and how often you need them. This information is not intended to replace advice given to you by your health care provider. Make sure you discuss any questions you have with your health care provider. Document Released: 07/04/2015 Document Revised: 02/25/2016 Document Reviewed: 04/08/2015 Elsevier Interactive Patient Education  2017 [place] Prevention in the Home Falls can cause injuries. They can happen to people of all ages. There are many things you can do to make your home safe and to help prevent falls. What can I do on the outside of my home? Regularly fix the edges of walkways and driveways and fix any cracks. Remove anything that might make you trip as you walk through a door, such as a raised step or threshold. Trim any bushes or trees on the path to your home. Use bright outdoor lighting. Clear any walking paths of anything that might make someone trip, such as rocks or tools. Regularly check to see if handrails are loose or broken. Make sure that both sides of any steps have handrails. Any raised decks and porches should have guardrails on the edges. Have any leaves, snow, or ice cleared regularly. Use sand or salt on walking paths during winter. Clean up any spills in your garage right away. This includes oil or grease spills. What can I do in the bathroom? Use night lights. Install grab bars by the toilet and in the tub and shower. Do not use towel bars as grab bars. Use non-skid mats or decals in the tub or shower. If you need to sit down in the shower, use a plastic, non-slip stool. Keep  the floor dry. Clean up any water that spills on the floor as soon as it happens. Remove soap buildup in the tub or shower regularly. Attach bath mats securely with double-sided non-slip rug tape. Do not have throw rugs and other things on the floor that can make you trip. What can I do in the bedroom? Use night lights. Make sure that you have a light by your bed that is easy to reach. Do not use any sheets or blankets that are too big for your bed. They should not hang down onto the floor. Have a firm chair that has side arms. You can use this for support while you get dressed. Do not have throw rugs and other things on the floor that can make you trip. What can I do in the kitchen? Clean up any spills right away. Avoid walking on wet floors. Keep items that you use a lot in easy-to-reach places. If you need to reach something above you, use a strong step stool that has a grab bar. Keep electrical cords out of the way. Do not use floor polish or wax that makes floors slippery. If you must use wax, use non-skid floor wax. Do not have throw rugs and other things on the floor that can make you trip. What can I do with my stairs? Do not leave any items on the stairs. Make sure that there are handrails  on both sides of the stairs and use them. Fix handrails that are broken or loose. Make sure that handrails are as long as the stairways. Check any carpeting to make sure that it is firmly attached to the stairs. Fix any carpet that is loose or worn. Avoid having throw rugs at the top or bottom of the stairs. If you do have throw rugs, attach them to the floor with carpet tape. Make sure that you have a light switch at the top of the stairs and the bottom of the stairs. If you do not have them, ask someone to add them for you. What else can I do to help prevent falls? Wear shoes that: Do not have high heels. Have rubber bottoms. Are comfortable and fit you well. Are closed at the toe. Do not  wear sandals. If you use a stepladder: Make sure that it is fully opened. Do not climb a closed stepladder. Make sure that both sides of the stepladder are locked into place. Ask someone to hold it for you, if possible. Clearly mark and make sure that you can see: Any grab bars or handrails. First and last steps. Where the edge of each step is. Use tools that help you move around (mobility aids) if they are needed. These include: Canes. Walkers. Scooters. Crutches. Turn on the lights when you go into a dark area. Replace any light bulbs as soon as they burn out. Set up your furniture so you have a clear path. Avoid moving your furniture around. If any of your floors are uneven, fix them. If there are any pets around you, be aware of where they are. Review your medicines with your doctor. Some medicines can make you feel dizzy. This can increase your chance of falling. Ask your doctor what other things that you can do to help prevent falls. This information is not intended to replace advice given to you by your health care provider. Make sure you discuss any questions you have with your health care provider. Document Released: 04/03/2009 Document Revised: 11/13/2015 Document Reviewed: 07/12/2014 Elsevier Interactive Patient Education  2017 [employer].

## 2021-05-22 ENCOUNTER — Inpatient Hospital Stay (HOSPITAL_COMMUNITY): Payer: Medicare HMO

## 2021-05-22 ENCOUNTER — Inpatient Hospital Stay (HOSPITAL_COMMUNITY): Payer: Medicare HMO | Attending: Hematology

## 2021-05-22 ENCOUNTER — Other Ambulatory Visit: Payer: Self-pay

## 2021-05-22 DIAGNOSIS — M8580 Other specified disorders of bone density and structure, unspecified site: Secondary | ICD-10-CM | POA: Diagnosis not present

## 2021-05-22 DIAGNOSIS — Z87891 Personal history of nicotine dependence: Secondary | ICD-10-CM | POA: Diagnosis not present

## 2021-05-22 DIAGNOSIS — D6481 Anemia due to antineoplastic chemotherapy: Secondary | ICD-10-CM | POA: Diagnosis not present

## 2021-05-22 DIAGNOSIS — E871 Hypo-osmolality and hyponatremia: Secondary | ICD-10-CM | POA: Diagnosis not present

## 2021-05-22 DIAGNOSIS — Z5189 Encounter for other specified aftercare: Secondary | ICD-10-CM | POA: Insufficient documentation

## 2021-05-22 DIAGNOSIS — Z79899 Other long term (current) drug therapy: Secondary | ICD-10-CM | POA: Insufficient documentation

## 2021-05-22 DIAGNOSIS — Z5111 Encounter for antineoplastic chemotherapy: Secondary | ICD-10-CM | POA: Insufficient documentation

## 2021-05-22 DIAGNOSIS — Z170 Estrogen receptor positive status [ER+]: Secondary | ICD-10-CM | POA: Insufficient documentation

## 2021-05-22 DIAGNOSIS — T451X5A Adverse effect of antineoplastic and immunosuppressive drugs, initial encounter: Secondary | ICD-10-CM | POA: Insufficient documentation

## 2021-05-22 DIAGNOSIS — Z95828 Presence of other vascular implants and grafts: Secondary | ICD-10-CM

## 2021-05-22 DIAGNOSIS — C50012 Malignant neoplasm of nipple and areola, left female breast: Secondary | ICD-10-CM | POA: Insufficient documentation

## 2021-05-22 DIAGNOSIS — C50912 Malignant neoplasm of unspecified site of left female breast: Secondary | ICD-10-CM

## 2021-05-22 LAB — COMPREHENSIVE METABOLIC PANEL
ALT: 14 U/L (ref 0–44)
AST: 23 U/L (ref 15–41)
Albumin: 4.5 g/dL (ref 3.5–5.0)
Alkaline Phosphatase: 41 U/L (ref 38–126)
Anion gap: 6 (ref 5–15)
BUN: 8 mg/dL (ref 8–23)
CO2: 27 mmol/L (ref 22–32)
Calcium: 9.1 mg/dL (ref 8.9–10.3)
Chloride: 97 mmol/L — ABNORMAL LOW (ref 98–111)
Creatinine, Ser: 0.71 mg/dL (ref 0.44–1.00)
GFR, Estimated: >60 mL/min (ref >60)
Glucose, Bld: 68 mg/dL — ABNORMAL LOW (ref 70–99)
Potassium: 4 mmol/L (ref 3.5–5.1)
Sodium: 130 mmol/L — ABNORMAL LOW (ref 135–145)
Total Bilirubin: 0.8 mg/dL (ref 0.3–1.2)
Total Protein: 7 g/dL (ref 6.5–8.1)

## 2021-05-22 LAB — CBC WITH DIFFERENTIAL/PLATELET
Abs Immature Granulocytes: 0.03 10*3/uL (ref 0.00–0.07)
Basophils Absolute: 0.1 10*3/uL (ref 0.0–0.1)
Basophils Relative: 1 %
Eosinophils Absolute: 0.2 10*3/uL (ref 0.0–0.5)
Eosinophils Relative: 3 %
HCT: 34.5 % — ABNORMAL LOW (ref 36.0–46.0)
Hemoglobin: 11.6 g/dL — ABNORMAL LOW (ref 12.0–15.0)
Immature Granulocytes: 0 %
Lymphocytes Relative: 37 %
Lymphs Abs: 2.7 10*3/uL (ref 0.7–4.0)
MCH: 33.8 pg (ref 26.0–34.0)
MCHC: 33.6 g/dL (ref 30.0–36.0)
MCV: 100.6 fL — ABNORMAL HIGH (ref 80.0–100.0)
Monocytes Absolute: 0.6 10*3/uL (ref 0.1–1.0)
Monocytes Relative: 8 %
Neutro Abs: 3.7 10*3/uL (ref 1.7–7.7)
Neutrophils Relative %: 51 %
Platelets: 307 10*3/uL (ref 150–400)
RBC: 3.43 MIL/uL — ABNORMAL LOW (ref 3.87–5.11)
RDW: 13.2 % (ref 11.5–15.5)
WBC: 7.3 10*3/uL (ref 4.0–10.5)
nRBC: 0 % (ref 0.0–0.2)

## 2021-05-22 LAB — MAGNESIUM: Magnesium: 1.9 mg/dL (ref 1.7–2.4)

## 2021-05-22 NOTE — Progress Notes (Signed)

## 2021-05-25 ENCOUNTER — Encounter (HOSPITAL_COMMUNITY)
Admission: RE | Admit: 2021-05-25 | Discharge: 2021-05-25 | Disposition: A | Discharge status: Home / Self Care | Payer: Medicare HMO | Source: Ambulatory Visit | Attending: General Surgery | Admitting: General Surgery

## 2021-05-25 ENCOUNTER — Inpatient Hospital Stay: Payer: Medicare HMO | Admitting: Genetic Counselor

## 2021-05-25 ENCOUNTER — Other Ambulatory Visit: Payer: Self-pay

## 2021-05-25 NOTE — Patient Instructions (Signed)
Samantha Mcfarland  05/25/2021     @PREFPERIOPPHARMACY @   Your procedure is scheduled on 05/27/2021.  Report to [facility] at 6:15 A.M.  Call this number if you have problems the morning of surgery:  [phone]   Remember:  Do not eat or drink after midnight.      Take these medicines the morning of surgery with A SIP OF WATER : Prozac, Omeprazole and Compazine    Do not wear jewelry, make-up or nail polish.  Do not wear lotions, powders, or perfumes, or deodorant.  Do not shave 48 hours prior to surgery.  Men may shave face and neck.  Do not bring valuables to the hospital.  [facility] is not responsible for any belongings or valuables.  Contacts, dentures or bridgework may not be worn into surgery.  Leave your suitcase in the car.  After surgery it may be brought to your room.  For patients admitted to the hospital, discharge time will be determined by your treatment team.  Patients discharged the day of surgery will not be allowed to drive home.   Name and phone number of your driver:   Family Special instructions:  N/A  Please read over the following fact sheets that you were given. Care and Recovery After Surgery  Implanted Port Insertion Implanted port insertion is a procedure to put in a port and catheter. The port is a device with an injectable disc that can be accessed by your health care provider. The port is connected to a vein in the chest or neck by a small, thin tube (catheter). There are different types of ports. The implanted port may be used as a long-term IV access for: Medicines, such as chemotherapy. Fluids. Liquid nutrition, such as total parenteral nutrition (TPN). When you have a port, your health care provider can choose to use the port instead of veins in your arms for these procedures. Tell a health care provider about: Any allergies you have. All medicines you are taking, especially blood thinners, as well as any vitamins, herbs, eye drops, creams,  over-the-counter medicines, and steroids. Any problems you or family members have had with anesthetic medicines. Any bleeding problems you have. Any surgeries you have had. Any medical conditions you have or have had, including diabetes or kidney problems. Whether you are pregnant or may be pregnant. What are the risks? Generally, this is a safe procedure. However, problems may occur, including: Allergic reactions to medicines or dyes. Damage to other structures or organs. Infection. Damage to the blood vessel, bruising, or bleeding at the puncture site. Blood clot. Breakdown of the skin over the port. A collection of air in the chest that can cause one of the lungs to collapse (pneumothorax). This is rare. What happens before the procedure? When to stop eating and drinking Follow instructions from your health care provider about what you may eat and drink before your procedure. These may include: 8 hours before your procedure Stop eating most foods. Do not eat meat, fried foods, or fatty foods. Eat only light foods, such as toast or crackers. All liquids are okay except energy drinks and alcohol. 6 hours before your procedure Stop eating. Drink only clear liquids, such as water, clear fruit juice, black coffee, plain tea, and sports drinks. Do not drink energy drinks or alcohol. 2 hours before your procedure Stop drinking all liquids. You may be allowed to take medicines with small sips of water. If you do not follow your health care provider's  instructions, your procedure may be delayed or canceled. Medicines Ask your health care provider about: Changing or stopping your regular medicines. This is especially important if you are taking diabetes medicines or blood thinners. Taking medicines such as aspirin and ibuprofen. These medicines can thin your blood. Do not take these medicines unless your health care provider tells you to take them. Taking over-the-counter medicines,  vitamins, herbs, and supplements. General instructions If you will be going home right after the procedure, plan to have a responsible adult: Take you home from the hospital or clinic. You will not be allowed to drive. Care for you for the time you are told. You may have blood tests. Do not use any products that contain nicotine or tobacco for at least 4 weeks before the procedure. These products include cigarettes, chewing tobacco, and vaping devices, such as e-cigarettes. If you need help quitting, ask your health care provider. Ask your health care provider what steps will be taken to help prevent infection. These may include: Removing hair at the surgery site. Washing skin with a germ-killing soap. Taking antibiotic medicine. What happens during the procedure?  An IV will be inserted into one of your veins. You will be given one or more of the following: A medicine to help you relax (sedative). A medicine to numb the area (local anesthetic). Two small incisions will be made to insert the port. One smaller incision will be made in your neck to get access to the vein where the catheter will lie. The other incision will be made in the upper chest. This is where the port will lie. The procedure may be done using continuous X-ray (fluoroscopy) or other imaging tools for guidance. The port and catheter will be placed. There may be a small, raised area where the port is placed. The port will be flushed with a saline solution, which is made of salt and water, and blood will be drawn to make sure that the port is working correctly. The incisions will be closed. Bandages (dressings) may be placed over the incisions. The procedure may vary among health care providers and hospitals. What happens after the procedure? Your blood pressure, heart rate, breathing rate, and blood oxygen level will be monitored until you leave the hospital or clinic. If you were given a sedative during the procedure, it  can affect you for several hours. Do not drive or operate machinery until your health care provider says that it is safe. You will be given a manufacturer's information card for the type of port that you have. Keep this with you. Your port will need to be flushed and checked as told by your health care provider, usually every few weeks. A chest X-ray will be done to: Check the placement of the port. Make sure there is no injury to your lung. Summary Implanted port insertion is a procedure to put in a port and catheter. The implanted port is used as a long-term IV access. The port will need to be flushed and checked as told by your health care provider, usually every few weeks. Keep your manufacturer's information card with you at all times. This information is not intended to replace advice given to you by your health care provider. Make sure you discuss any questions you have with your health care provider. Document Revised: 12/09/2020 Document Reviewed: 12/09/2020 Elsevier Patient Education  [place].

## 2021-05-27 ENCOUNTER — Ambulatory Visit (HOSPITAL_COMMUNITY): Payer: Medicare HMO

## 2021-05-27 ENCOUNTER — Ambulatory Visit (HOSPITAL_COMMUNITY)
Admission: RE | Admit: 2021-05-27 | Discharge: 2021-05-27 | Disposition: A | Discharge status: Home / Self Care | Payer: Medicare HMO | Attending: General Surgery | Admitting: General Surgery

## 2021-05-27 ENCOUNTER — Encounter (HOSPITAL_COMMUNITY): Payer: Self-pay | Admitting: General Surgery

## 2021-05-27 ENCOUNTER — Encounter (HOSPITAL_COMMUNITY)
Admission: RE | Disposition: A | Discharge status: Home / Self Care | Payer: Self-pay | Source: Home / Self Care | Attending: General Surgery

## 2021-05-27 ENCOUNTER — Other Ambulatory Visit: Payer: Self-pay

## 2021-05-27 ENCOUNTER — Ambulatory Visit (HOSPITAL_COMMUNITY): Payer: Medicare HMO | Admitting: Anesthesiology

## 2021-05-27 DIAGNOSIS — Z9012 Acquired absence of left breast and nipple: Secondary | ICD-10-CM | POA: Diagnosis not present

## 2021-05-27 DIAGNOSIS — D225 Melanocytic nevi of trunk: Secondary | ICD-10-CM | POA: Insufficient documentation

## 2021-05-27 DIAGNOSIS — Z87891 Personal history of nicotine dependence: Secondary | ICD-10-CM | POA: Insufficient documentation

## 2021-05-27 DIAGNOSIS — C50912 Malignant neoplasm of unspecified site of left female breast: Secondary | ICD-10-CM | POA: Diagnosis not present

## 2021-05-27 DIAGNOSIS — Z95828 Presence of other vascular implants and grafts: Secondary | ICD-10-CM

## 2021-05-27 DIAGNOSIS — Z170 Estrogen receptor positive status [ER+]: Secondary | ICD-10-CM | POA: Diagnosis not present

## 2021-05-27 DIAGNOSIS — Z452 Encounter for adjustment and management of vascular access device: Secondary | ICD-10-CM | POA: Diagnosis not present

## 2021-05-27 HISTORY — PX: EXCISION OF SKIN TAG: SHX6270

## 2021-05-27 HISTORY — PX: PORTACATH PLACEMENT: SHX2246

## 2021-05-27 SURGERY — INSERTION, TUNNELED CENTRAL VENOUS DEVICE, WITH PORT
Anesthesia: General | Site: Chest | Laterality: Right

## 2021-05-27 MED ORDER — PROPOFOL 500 MG/50ML IV EMUL
INTRAVENOUS | Status: DC | PRN
  Administered 2021-05-27: 50 ug/kg/min via INTRAVENOUS

## 2021-05-27 MED ORDER — VANCOMYCIN HCL 1000 MG/200ML IV SOLN
INTRAVENOUS | Status: AC
  Administered 2021-05-27: 1000 mg via INTRAVENOUS
  Filled 2021-05-27: qty 200

## 2021-05-27 MED ORDER — FENTANYL CITRATE PF 50 MCG/ML IJ SOSY: 25.0000 ug | PREFILLED_SYRINGE | INTRAMUSCULAR | Status: DC | PRN

## 2021-05-27 MED ORDER — ONDANSETRON HCL 4 MG/2ML IJ SOLN: 4.0000 mg | Freq: Once | INTRAMUSCULAR | Status: DC | PRN

## 2021-05-27 MED ORDER — CHLORHEXIDINE GLUCONATE 0.12 % MT SOLN
15.0000 mL | Freq: Once | OROMUCOSAL | Status: AC
  Administered 2021-05-27: 15 mL via OROMUCOSAL

## 2021-05-27 MED ORDER — LACTATED RINGERS IV SOLN: INTRAVENOUS | Status: DC

## 2021-05-27 MED ORDER — PROPOFOL 10 MG/ML IV BOLUS
INTRAVENOUS | Status: DC | PRN
  Administered 2021-05-27: 30 mg via INTRAVENOUS
  Administered 2021-05-27 (×2): 20 mg via INTRAVENOUS

## 2021-05-27 MED ORDER — ORAL CARE MOUTH RINSE: 15.0000 mL | Freq: Once | OROMUCOSAL | Status: AC

## 2021-05-27 MED ORDER — OXYCODONE HCL 5 MG PO TABS: 5.0000 mg | ORAL_TABLET | ORAL | 0 refills | Status: DC | PRN

## 2021-05-27 MED ORDER — HEPARIN SOD (PORK) LOCK FLUSH 100 UNIT/ML IV SOLN
INTRAVENOUS | Status: AC
  Filled 2021-05-27: qty 5

## 2021-05-27 MED ORDER — LIDOCAINE HCL (PF) 1 % IJ SOLN
INTRAMUSCULAR | Status: DC | PRN
  Administered 2021-05-27: 4 mL

## 2021-05-27 MED ORDER — CHLORHEXIDINE GLUCONATE CLOTH 2 % EX PADS: 6.0000 | MEDICATED_PAD | Freq: Once | CUTANEOUS | Status: DC

## 2021-05-27 MED ORDER — FENTANYL CITRATE (PF) 100 MCG/2ML IJ SOLN
INTRAMUSCULAR | Status: DC | PRN
  Administered 2021-05-27 (×2): 25 ug via INTRAVENOUS
  Administered 2021-05-27: 50 ug via INTRAVENOUS

## 2021-05-27 MED ORDER — MIDAZOLAM HCL 2 MG/2ML IJ SOLN
INTRAMUSCULAR | Status: AC
  Filled 2021-05-27: qty 2

## 2021-05-27 MED ORDER — LIDOCAINE HCL (PF) 1 % IJ SOLN
INTRAMUSCULAR | Status: AC
  Filled 2021-05-27: qty 30

## 2021-05-27 MED ORDER — VANCOMYCIN HCL 1000 MG/200ML IV SOLN: 1000.0000 mg | INTRAVENOUS | Status: AC

## 2021-05-27 MED ORDER — EPHEDRINE SULFATE 50 MG/ML IJ SOLN
INTRAMUSCULAR | Status: DC | PRN
  Administered 2021-05-27: 10 mg via INTRAVENOUS

## 2021-05-27 MED ORDER — LIDOCAINE HCL (CARDIAC) PF 100 MG/5ML IV SOSY
PREFILLED_SYRINGE | INTRAVENOUS | Status: DC | PRN
  Administered 2021-05-27: 40 mg via INTRATRACHEAL

## 2021-05-27 MED ORDER — SODIUM CHLORIDE (PF) 0.9 % IJ SOLN
INTRAMUSCULAR | Status: DC | PRN
  Administered 2021-05-27: 500 mL

## 2021-05-27 MED ORDER — HEPARIN SOD (PORK) LOCK FLUSH 100 UNIT/ML IV SOLN
INTRAVENOUS | Status: DC | PRN
  Administered 2021-05-27: 500 [IU]

## 2021-05-27 MED ORDER — MIDAZOLAM HCL 2 MG/2ML IJ SOLN
INTRAMUSCULAR | Status: DC | PRN
Start: 2021-05-27 — End: 2021-05-27
  Administered 2021-05-27: 2 mg via INTRAVENOUS

## 2021-05-27 MED ORDER — FENTANYL CITRATE (PF) 100 MCG/2ML IJ SOLN
INTRAMUSCULAR | Status: AC
  Filled 2021-05-27: qty 2

## 2021-05-27 SURGICAL SUPPLY — 37 items
ADH SKN CLS APL DERMABOND .7 (GAUZE/BANDAGES/DRESSINGS) ×1
APL PRP STRL LF ISPRP CHG 10.5 (MISCELLANEOUS) ×1
APPLICATOR CHLORAPREP 10.5 ORG (MISCELLANEOUS) ×2 IMPLANT
APPLIER CLIP 9.375 SM OPEN (CLIP)
APR CLP SM 9.3 20 MLT OPN (CLIP)
BAG DECANTER FOR FLEXI CONT (MISCELLANEOUS) ×2 IMPLANT
CLIP APPLIE 9.375 SM OPEN (CLIP) IMPLANT
CLOTH BEACON ORANGE TIMEOUT ST (SAFETY) ×2 IMPLANT
COVER LIGHT HANDLE STERIS (MISCELLANEOUS) ×4 IMPLANT
DERMABOND ADVANCED (GAUZE/BANDAGES/DRESSINGS) ×1
DERMABOND ADVANCED .7 DNX12 (GAUZE/BANDAGES/DRESSINGS) ×1 IMPLANT
DRAPE C-ARM FOLDED MOBILE STRL (DRAPES) ×2 IMPLANT
ELECT REM PT RETURN 9FT ADLT (ELECTROSURGICAL) ×2
ELECTRODE REM PT RTRN 9FT ADLT (ELECTROSURGICAL) ×1 IMPLANT
GAUZE 4X4 16PLY ~~LOC~~+RFID DBL (SPONGE) ×2 IMPLANT
GLOVE SURG ENC MOIS LTX SZ6.5 (GLOVE) ×2 IMPLANT
GLOVE SURG POLYISO LF SZ6.5 (GLOVE) ×2 IMPLANT
GLOVE SURG UNDER POLY LF SZ6.5 (GLOVE) ×4 IMPLANT
GLOVE SURG UNDER POLY LF SZ7 (GLOVE) ×4 IMPLANT
GOWN STRL REUS W/TWL LRG LVL3 (GOWN DISPOSABLE) ×6 IMPLANT
IV NS 500ML (IV SOLUTION) ×2
IV NS 500ML BAXH (IV SOLUTION) ×1 IMPLANT
KIT PORT POWER 8FR ISP MRI (Port) ×2 IMPLANT
KIT TURNOVER KIT A (KITS) ×2 IMPLANT
MANIFOLD NEPTUNE II (INSTRUMENTS) ×2 IMPLANT
NEEDLE HYPO 18GX1.5 BLUNT FILL (NEEDLE) ×2 IMPLANT
NEEDLE HYPO 25X1 1.5 SAFETY (NEEDLE) ×2 IMPLANT
PACK MINOR (CUSTOM PROCEDURE TRAY) ×2 IMPLANT
PAD ARMBOARD 7.5X6 YLW CONV (MISCELLANEOUS) ×2 IMPLANT
SET BASIN LINEN APH (SET/KITS/TRAYS/PACK) ×2 IMPLANT
SUT MNCRL AB 4-0 PS2 18 (SUTURE) ×2 IMPLANT
SUT PROLENE 2 0 SH 30 (SUTURE) ×2 IMPLANT
SUT VIC AB 3-0 SH 27 (SUTURE) ×2
SUT VIC AB 3-0 SH 27X BRD (SUTURE) ×1 IMPLANT
SYR 10ML LL (SYRINGE) ×4 IMPLANT
SYR 30ML LL (SYRINGE) ×2 IMPLANT
SYR CONTROL 10ML LL (SYRINGE) ×2 IMPLANT

## 2021-05-27 NOTE — Transfer of Care (Signed)
Immediate Anesthesia Transfer of Care Note  Patient: Samantha Mcfarland  Procedure(s) Performed: INSERTION PORT-A-CATH, EXCISION OF RIGHT CHEST SKIN TAG (Right: Chest)  Patient Location: Short Stay  Anesthesia Type:General  Level of Consciousness: awake, alert  and oriented  Airway & Oxygen Therapy: Patient Spontanous Breathing  Post-op Assessment: Report given to RN and Post -op Vital signs reviewed and stable  Post vital signs: Reviewed and Stable   Last Vitals:  Vitals Value Taken Time  BP    Temp    Pulse    Resp    SpO2      Last Pain:  Vitals:   05/27/21 0651  PainSc: 0-No pain         Complications: No notable events documented.

## 2021-05-27 NOTE — Anesthesia Preprocedure Evaluation (Signed)
Anesthesia Evaluation  Patient identified by MRN, date of birth, ID band Patient awake    Reviewed: Allergy & Precautions, NPO status , Patient's Chart, lab work & pertinent test results  History of Anesthesia Complications Negative for: history of anesthetic complications  Airway Mallampati: I  TM Distance: >3 FB Neck ROM: Full   Comment: Neck sx, chronic nec pain Dental  (+) Dental Advisory Given   Pulmonary Patient abstained from smoking., former smoker,    Pulmonary exam normal breath sounds clear to auscultation       Cardiovascular hypertension, Pt. on medications Normal cardiovascular exam Rhythm:Regular Rate:Normal     Neuro/Psych PSYCHIATRIC DISORDERS Anxiety Depression    GI/Hepatic GERD  Medicated and Controlled,(+)     substance abuse  alcohol use,   Endo/Other  negative endocrine ROS  Renal/GU negative Renal ROS     Musculoskeletal negative musculoskeletal ROS (+)   Abdominal   Peds  Hematology negative hematology ROS (+)   Anesthesia Other Findings Neck sx, chronic neck pain Left breast cancer  Reproductive/Obstetrics negative OB ROS                            Anesthesia Physical Anesthesia Plan  ASA: 2  Anesthesia Plan: General   Post-op Pain Management:    Induction:   PONV Risk Score and Plan:   Airway Management Planned: Nasal Cannula, Natural Airway and Simple Face Mask  Additional Equipment:   Intra-op Plan:   Post-operative Plan:   Informed Consent: I have reviewed the patients History and Physical, chart, labs and discussed the procedure including the risks, benefits and alternatives for the proposed anesthesia with the patient or authorized representative who has indicated his/her understanding and acceptance.     Dental advisory given  Plan Discussed with: CRNA and Surgeon  Anesthesia Plan Comments:         Anesthesia Quick  Evaluation

## 2021-05-27 NOTE — Progress Notes (Signed)
[facility]  CXR ordered. Updated sister. Rx to [employer].  [name], MD [facility] [address] [place], [place] [address] [phone] (office)

## 2021-05-27 NOTE — Op Note (Addendum)
Operative Note 05/27/21   Preoperative Diagnosis: Left breast cancer    Postoperative Diagnosis: Same   Procedure(s) Performed: Port-A-Cath placement, right subclavian; excision of right chest skin tag    Surgeon: Lanell Matar. Constance Haw, MD   Assistants: No qualified resident was available   Anesthesia: Monitored anesthesia care   Anesthesiologist: Dr. Wyatt Haste    Specimens: None   Estimated Blood Loss: Minimal   Fluoroscopy time: See report    Blood Replacement: None    Complications: None    Operative Findings: Normal anatomy   Indications: Samantha Mcfarland is a 65 yo with left breast cancer who needs a port placed. We discussed risk of bleeding, infection, and pneumothorax.   Procedure: The patient was brought into the operating room and monitored anesthesia was induced.  One percent lidocaine was used for local anesthesia.   The right chest and neck was prepped and draped in the usual sterile fashion.  Preoperative antibiotics were given.  An incision was made below the right clavicle. There was a small skin tag that was removed and sent to pathology. A subcutaneous pocket was formed. The needles advanced into the  subclavian vein using the Seldinger technique without difficulty. A guidewire was then advanced into the right atrium under fluoroscopic guidance.  Ectopia was not noted. An introducer and peel-away sheath were placed over the guidewire. The catheter was then inserted through the peel-away sheath and the peel-away sheath was removed.  A spot film was performed to confirm the position. The catheter was then attached to the port and the port placed in subcutaneous pocket. Adequate positioning was confirmed by fluoroscopy. Hemostasis was confirmed, and the port was secured with 2-0 prolene sutures.  Good backflow of blood was noted on aspiration of the port. The port was flushed with heparin flush. Subcutaneous layer was reapproximated using a 3-0 Vicryl interrupted suture. The skin was  closed using a 4-0 Vicryl subcuticular suture. Dermabond was applied.    All tape and needle counts were correct at the end of the procedure. The patient was transferred to PACU in stable condition. A chest x-ray will be performed at that time.  [name], MD [facility] [address] [place], [place] [address] [phone] (office)

## 2021-05-27 NOTE — Discharge Instructions (Addendum)
Keep area clean and dry. You can take a shower in 24 hours. Do not submerge in water.  Take tylenol and ibuprofen for pain control and Roxicodone for severe pain.  

## 2021-05-27 NOTE — Progress Notes (Signed)
[facility] [address], [place] [address]   CLINIC:  Medical Oncology/Hematology  PCP:  Samantha Fudge, FNP [address] [place] / [place] [address] [phone]   REASON FOR VISIT:  Follow-up for left breast cancer  PRIOR THERAPY: none  CURRENT THERAPY: Taxotere and Cytoxan ever 3 weeks  BRIEF ONCOLOGIC HISTORY:  Oncology History  Breast cancer, left (HCC)  04/22/2021 Initial Diagnosis   Breast cancer, left (HCC)   04/22/2021 Cancer Staging   Staging form: Breast, AJCC 8th Edition - Clinical stage from 04/22/2021: Stage IA (cT1c, cN0, cM0, G2, ER+, PR-, HER2-) - Signed by Samantha Massed, MD on 04/22/2021 Stage prefix: Initial diagnosis Nuclear grade: G2 Histologic grading system: 3 grade system HER2-IHC interpretation: Negative HER2-IHC value: Score 1+ Ki-67 (%): 25    05/28/2021 -  Chemotherapy   Patient is on Treatment Plan : BREAST TC q21d       CANCER STAGING:  Cancer Staging  Breast cancer, left (HCC) Staging form: Breast, AJCC 8th Edition - Clinical stage from 04/22/2021: Stage IA (cT1c, cN0, cM0, G2, ER+, PR-, HER2-) - Signed by Samantha Massed, MD on 04/22/2021   INTERVAL HISTORY:  [name], a 65 y.o. adult, returns for routine follow-up and consideration for next cycle of chemotherapy. [name] was last seen on 05/06/2021.  Due for cycle #1 of TC today.   Overall, she tells me she has been feeling pretty well. She denies numbness/tingling. She reports fatigue. She reports she is reducing her alcohol consumption; she drank 2 beers yesterday. She reports occasional constipation.   Overall, she feels ready for next cycle of chemo today.   REVIEW OF SYSTEMS:  Review of Systems  Constitutional:  Positive for fatigue (50%). Negative for appetite change (60%).  Cardiovascular:  Positive for chest pain (4/10 at port site).  Gastrointestinal:  Positive for constipation.  Neurological:  Positive for headaches.   Psychiatric/Behavioral:  Positive for sleep disturbance. The patient is nervous/anxious.   All other systems reviewed and are negative.  PAST MEDICAL/SURGICAL HISTORY:  Past Medical History:  Diagnosis Date   Allergy    Anxiety    Breast cancer (HCC)    Depression    GERD (gastroesophageal reflux disease)    Hyperlipidemia    Hypertension    Osteopenia 04/30/2021   Port-A-Cath in place 05/07/2021   Vitamin D insufficiency    Past Surgical History:  Procedure Laterality Date   MASTECTOMY W/ SENTINEL NODE BIOPSY Left 03/23/2021   Procedure: TOTAL MASTECTOMY WITH SENTINEL LYMPH NODE BIOPSY;  Surgeon: Samantha Roers, MD;  Location: [facility];  Service: General;  Laterality: Left;   NECK SURGERY     OOPHORECTOMY     SALPINGECTOMY      SOCIAL HISTORY:  Social History   Socioeconomic History   Marital status: Divorced    Spouse name: Not on file   Number of children: 0   Years of education: Not on file   Highest education level: Not on file  Occupational History   Occupation: retired  Tobacco Use   Smoking status: Former    Packs/day: 0.25    Types: Cigarettes   Smokeless tobacco: Never   Tobacco comments:    1 pack every 3-4 days  Vaping Use   Vaping Use: Never used  Substance and Sexual Activity   Alcohol use: Yes    Alcohol/week: 20.0 standard drinks    Types: 20 Cans of beer per week    Comment: 3-4 beers  per day   Drug use: Never   Sexual activity: Not Currently    Birth control/protection: Post-menopausal  Other Topics Concern   Not on file  Social History Narrative   Sister lives about 15 minutes away   Social Determinants of Health   Financial Resource Strain: Low Risk    Difficulty of Paying Living Expenses: Not hard at all  Food Insecurity: No Food Insecurity   Worried About Charity fundraiser in the Last Year: Never true   Arboriculturist in the Last Year: Never true  Transportation Needs: No Transportation Needs   Lack of Transportation  (Medical): No   Lack of Transportation (Non-Medical): No  Physical Activity: Insufficiently Active   Days of Exercise per Week: 5 days   Minutes of Exercise per Session: 20 min  Stress: Stress Concern Present   Feeling of Stress : To some extent  Social Connections: Socially Isolated   Frequency of Communication with Friends and Family: More than three times a week   Frequency of Social Gatherings with Friends and Family: More than three times a week   Attends Religious Services: Never   Marine scientist or Organizations: No   Attends Music therapist: Never   Marital Status: Divorced  Human resources officer Violence: Not At Risk   Fear of Current or Ex-Partner: No   Emotionally Abused: No   Physically Abused: No   Sexually Abused: No    FAMILY HISTORY:  Family History  Problem Relation Age of Onset   Stroke Mother    Alcohol abuse Mother    Diabetes Father    Pancreatic cancer Father    Testicular cancer Father    Pancreatic disease Father    Breast cancer Maternal Aunt    Breast cancer Paternal Aunt    Breast cancer Maternal Grandmother    Colon cancer Paternal Grandfather     CURRENT MEDICATIONS:  Current Outpatient Medications  Medication Sig Dispense Refill   atorvastatin (LIPITOR) 20 MG tablet Take 1 tablet (20 mg total) by mouth daily. 30 tablet 2   CYCLOPHOSPHAMIDE IV Inject into the vein every 21 ( twenty-one) days.     DOCEtaxel (TAXOTERE IV) Inject into the vein every 21 ( twenty-one) days.     FLUoxetine (PROZAC) 20 MG capsule Take 1 capsule (20 mg total) by mouth daily. 30 capsule 2   lidocaine-prilocaine (EMLA) cream Apply a small amount to port a cath site (do not rub in) and cover with plastic wrap 1 hour prior to chemotherapy appointments 30 g 3   lisinopril (ZESTRIL) 10 MG tablet Take 1 tablet (10 mg total) by mouth daily with lunch. 30 tablet 2   omeprazole (PRILOSEC) 20 MG capsule Take 1 capsule (20 mg total) by mouth daily as needed  (indigestion/heartburn). (Patient taking differently: Take 20 mg by mouth daily.) 30 capsule 2   vitamin B-12 (CYANOCOBALAMIN) 500 MCG tablet Take 500 mcg by mouth daily.     acetaminophen (TYLENOL) 325 MG tablet Take 325 mg by mouth every 6 (six) hours as needed for moderate pain or headache. (Patient not taking: Reported on 05/28/2021)     oxyCODONE (ROXICODONE) 5 MG immediate release tablet Take 1 tablet (5 mg total) by mouth every 4 (four) hours as needed for breakthrough pain or severe pain. (Patient not taking: Reported on 05/28/2021) 10 tablet 0   prochlorperazine (COMPAZINE) 10 MG tablet Take 1 tablet (10 mg total) by mouth every 6 (six) hours as needed (Nausea  or vomiting). (Patient not taking: Reported on 05/28/2021) 30 tablet 1   No current facility-administered medications for this visit.    ALLERGIES:  Allergies  Allergen Reactions   Ibuprofen Other (See Comments)    "Stomach upset"   Penicillins     Allergic as a child. (Rash ???) Does not recall what type of reaction she has to drug.    PHYSICAL EXAM:  Performance status (ECOG): 1 - Symptomatic but completely ambulatory  Vitals:   05/28/21 0810  BP: 126/81  Pulse: 67  Resp: 18  Temp: (!) 97.1 F (36.2 C)  SpO2: 100%   Wt Readings from Last 3 Encounters:  05/28/21 122 lb 11.2 oz (55.7 kg)  05/21/21 123 lb (55.8 kg)  05/06/21 123 lb 12.8 oz (56.2 kg)   Physical Exam Vitals reviewed.  Constitutional:      Appearance: Normal appearance.  Cardiovascular:     Rate and Rhythm: Normal rate and regular rhythm.     Pulses: Normal pulses.     Heart sounds: Normal heart sounds.  Pulmonary:     Effort: Pulmonary effort is normal.     Breath sounds: Normal breath sounds.  Neurological:     General: No focal deficit present.     Mental Status: She is alert and oriented to person, place, and time.  Psychiatric:        Mood and Affect: Mood normal.        Behavior: Behavior normal.    LABORATORY DATA:  I have  reviewed the labs as listed.  CBC Latest Ref Rng & Units 05/28/2021 05/22/2021 04/22/2021  WBC 4.0 - 10.5 K/uL 6.5 7.3 6.8  Hemoglobin 12.0 - 15.0 g/dL 11.3(L) 11.6(L) 11.6(L)  Hematocrit 36.0 - 46.0 % 33.7(L) 34.5(L) 33.5(L)  Platelets 150 - 400 K/uL 321 307 276   CMP Latest Ref Rng & Units 05/28/2021 05/22/2021 04/22/2021  Glucose 70 - 99 mg/dL 80 68(L) 82  BUN 8 - 23 mg/dL 5(L) 8 9  Creatinine 0.44 - 1.00 mg/dL 0.57 0.71 0.56  Sodium 135 - 145 mmol/L 131(L) 130(L) 130(L)  Potassium 3.5 - 5.1 mmol/L 4.0 4.0 3.9  Chloride 98 - 111 mmol/L 100 97(L) 95(L)  CO2 22 - 32 mmol/L $RemoveB'24 27 25  'aWWRPXTx$ Calcium 8.9 - 10.3 mg/dL 9.2 9.1 9.6  Total Protein 6.5 - 8.1 g/dL 6.9 7.0 7.8  Total Bilirubin 0.3 - 1.2 mg/dL 0.7 0.8 0.5  Alkaline Phos 38 - 126 U/L 45 41 44  AST 15 - 41 U/L $Remo'25 23 27  'Ejuvx$ ALT 0 - 44 U/L $Remo'13 14 14    'qYumb$ DIAGNOSTIC IMAGING:  I have independently reviewed the scans and discussed with the patient. NM Bone Scan Whole Body  Result Date: 05/01/2021 CLINICAL DATA:  Breast cancer, staging.  Left rib pain EXAM: NUCLEAR MEDICINE WHOLE BODY BONE SCAN TECHNIQUE: Whole body anterior and posterior images were obtained approximately 3 hours after intravenous injection of radiopharmaceutical. RADIOPHARMACEUTICALS:  20.5 mCi Technetium-75m MDP IV COMPARISON:  None. FINDINGS: Uptake within the wrists bilaterally is likely degenerative in nature. Otherwise normal distribution of radiotracer within the axial and appendicular skeleton. No evidence of osseous metastatic disease. Mild uptake within the right breast noted. Otherwise normal distribution of radiotracer within the soft tissue. Normal uptake and excretion within the kidneys and bladder. IMPRESSION: No evidence of osseous metastatic disease. Electronically Signed   By: Fidela Salisbury M.D.   On: 05/01/2021 23:20   DG Bone Density  Result Date: 04/30/2021 EXAM: DUAL X-RAY ABSORPTIOMETRY (DXA)  FOR BONE MINERAL DENSITY IMPRESSION: Your patient Neely Kammerer completed  a BMD test on 04/30/2021 using the [place] (software version: 14.10) manufactured by [employer]. The following summarizes the results of our evaluation. Technologist: [facility] PATIENT BIOGRAPHICAL: Name: Lurlie, Wigen Patient ID: [id number] Birth Date: 03/09/1956 Height: 66.0 in. Gender: Female Exam Date: 04/30/2021 Weight: 128.2 lbs. Indications: Breast Ca, Caucasian, Height Loss, Post Menopausal, Tobacco User, Unilateral oophrectomy, Tobacco User (Current Smoker) Fractures: Treatments: DENSITOMETRY RESULTS: Site         Region     Measured Date Measured Age WHO Classification Young Adult T-score BMD         %Change vs. Previous Significant Change (*) DualFemur Neck Right 04/30/2021 65.0 Osteopenia -1.5 0.823 g/cm2 - - DualFemur Total Mean 04/30/2021 65.0 Osteopenia -1.4 0.833 g/cm2 - - Left Forearm Radius 33% 04/30/2021 65.0 Osteopenia -1.2 0.627 g/cm2 - - ASSESSMENT: The BMD measured at Femur Neck Right is 0.823 g/cm2 with a T-score of -1.5. This patient is considered osteopenic according to [facility]) criteria. The scan quality is good. Lumbar spine was excluded due to advanced degenerative changes. World Pharmacologist [facility]) criteria for post-menopausal, Caucasian Women: Normal:       T-score at or above -1 SD Osteopenia:   T-score between -1 and -2.5 SD Osteoporosis: T-score at or below -2.5 SD RECOMMENDATIONS: 1. All patients should optimize calcium and vitamin D intake. 2. Consider FDA-approved medical therapies in postmenopausal women and med aged 59 years and older, based on the following: a. A hip or vertebral (clinical or morphometric) fracture b. T-score< -2.5 at the femoral neck or spine after appropriate evaluation to exclude secondary causes c. Low bone mass (T-score between -1.0 and -2.5 at the femoral neck or spine) and a 10-year probability of a hip fracture > 3% or a 10-year probability of a major osteoporosis-related fracture > 20% based on the  [place]-adapted WHO algorithm d. Clinician judgment and/or patient preferences may indicate treatment for people with 10-year fracture probabilities above or below these levels FOLLOW-UP: Patients with diagnosis of osteoporsis or at high risk for fracture should have regular bone mineral density tests. For patients eligible for Medicare, routine testing is allowed once every 2 years. The testing frequency can be increased to one year for patients who have rapidly progressing disease, those who are receiving or discontinuing medical therapy to restore bone mass, or have additional risk factors. I have reviewed this report, and agree with the above findings. [facility] Your patient Moani Weipert completed a FRAX assessment on 04/30/2021 using the [place] (analysis version: 14.10) manufactured by [employer]. The following summarizes the results of our evaluation. PATIENT BIOGRAPHICAL: Name: Quentin, Strebel Patient ID: [id number] Birth Date: [birth year] Height:    66.0 in. Gender:     Female    [age]       Weight:    128.2 lbs. Ethnicity:  White                            Exam Date: 04/30/2021 FRAX* RESULTS:  (version: 3.5) 10-year Probability of Fracture1 Major Osteoporotic Fracture2 Hip Fracture 8.2% 1.6% Population: [place] (Caucasian) Risk Factors: Tobacco User (Current Smoker) Based on Femur (Right) Neck BMD 1 -The 10-year probability of fracture may be lower than reported if the patient has received treatment. 2 -Major Osteoporotic Fracture: Clinical Spine, Forearm, Hip or  Shoulder *FRAX is a Materials engineer of the [employer] for Metabolic Bone Disease, a [place] (WHO) [facility]. ASSESSMENT: The probability of a major osteoporotic fracture is 8.2% within the next ten years. The probability of a hip fracture is 1.6% within the next ten years. Electronically Signed   By: Rolm Baptise M.D.   On: 04/30/2021 10:51   DG Chest Port  1 View  Result Date: 05/27/2021 CLINICAL DATA:  Port placement, history of breast cancer EXAM: PORTABLE CHEST 1 VIEW COMPARISON:  None. FINDINGS: There is a right chest wall port in place with the tip in the mid SVC. The cardiomediastinal silhouette is normal. There is no focal consolidation or pulmonary edema. There is no pleural effusion or pneumothorax. There is a remote fracture of the left eighth rib. Cervical spine fusion hardware is noted. IMPRESSION: Right chest wall port in place with the tip in the mid SVC. No pneumothorax. Electronically Signed   By: Valetta Mole M.D.   On: 05/27/2021 08:48   DG C-Arm 1-60 Min-No Report  Result Date: 05/27/2021 Fluoroscopy was utilized by the requesting physician.  No radiographic interpretation.     ASSESSMENT:  Stage I (T1CN0) left breast IDC, ER positive, HER2/PR negative: - Abnormal mammogram followed by left breast subareolar mass biopsy on 02/24/2021 consistent with DCIS intermediate to high-grade - Left mastectomy and SLNB on 03/23/2021 - Pathology shows 1.5 cm IDC, grade 2, extensive DCIS, margins negative, 0/2 left axillary lymph nodes, PT1CN0.  ER 100%.  PR-negative.  HER2 1+.  Ki-67-25%. - Oncotype DX showed recurrence score 33.  There is more than 15% chemotherapy benefit.  Distant recurrence at 9 years with tamoxifen alone was 21%.   Social/family history: - She lives by herself.  She is seen with her sister today. 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 - She quit smoking 1 month ago.  Half pack per day for 35 years. - She drinks alcohol 3-5 beers daily. - Father had prostate and pancreatic cancer.  Maternal grandmother and maternal aunt had breast cancer.  Paternal grandfather had colon cancer.  Other people in her family also had cancers, type unknown to the patient.   PLAN:  Stage I (T1CN0) left breast IDC, ER positive, HER2/PR negative, Oncotype DX 33: - Reviewed labs today which showed normal LFTs and  white count and platelet count.  Mild microcytic anemia stable. - We will proceed with chemotherapy today with a 20% dose reduction to see how she tolerates. - She denies any baseline tingling or numbness in extremities. - Have told her to stop drinking beers.  She drinks about 2 beers per day. - She will be seen at our symptom management clinic next week.  RTC 3 weeks for follow-up.   2.  Bone health (osteopenia): - DEXA scan on 04/30/2021 with T score -1.5. - Continue calcium and vitamin D supplements.   Orders placed this encounter:  No orders of the defined types were placed in this encounter.    Derek Jack, MD [place] [phone]   I, Thana Ates, am acting as a scribe for Dr. Derek Jack.  I, Derek Jack MD, have reviewed the above documentation for accuracy and completeness, and I agree with the above.

## 2021-05-27 NOTE — H&P (Signed)
[facility] History and Physical     Samantha Mcfarland is a 65 y.o. adult.  HPI: Samantha Mcfarland is known to me after a left mastectomy for cancer. She will need to do chemotherapy and is here for a port a catheter. She has been doing well and not had any issues with her mastectomy site. She has some chronic neck pain but is otherwise well. She says she starts chemotherapy tomorrow.   Past Medical History:  Diagnosis Date   Allergy    Anxiety    Breast cancer ([place])    Depression    GERD (gastroesophageal reflux disease)    Hyperlipidemia    Hypertension    Osteopenia 04/30/2021   Port-A-Cath in place 05/07/2021   Vitamin D insufficiency     Past Surgical History:  Procedure Laterality Date   MASTECTOMY W/ SENTINEL NODE BIOPSY Left 03/23/2021   Procedure: TOTAL MASTECTOMY WITH SENTINEL LYMPH NODE BIOPSY;  Surgeon: Virl Cagey, MD;  Location: [facility];  Service: General;  Laterality: Left;   NECK SURGERY     OOPHORECTOMY     SALPINGECTOMY      Family History  Problem Relation Age of Onset   Stroke Mother    Alcohol abuse Mother    Diabetes Father    Pancreatic cancer Father    Testicular cancer Father    Pancreatic disease Father    Breast cancer Maternal Aunt    Breast cancer Paternal Aunt    Breast cancer Maternal Grandmother    Colon cancer Paternal Grandfather     Social History   Tobacco Use   Smoking status: Former    Packs/day: 0.25    Types: Cigarettes   Smokeless tobacco: Never   Tobacco comments:    1 pack every 3-4 days  Vaping Use   Vaping Use: Never used  Substance Use Topics   Alcohol use: Yes    Alcohol/week: 20.0 standard drinks    Types: 20 Cans of beer per week    Comment: 3-4 beers per day   Drug use: Never    Medications: I have reviewed the patient's current medications. Prior to Admission:  Medications Prior to Admission  Medication Sig Dispense Refill Last Dose   atorvastatin (LIPITOR) 20 MG tablet Take 1 tablet (20 mg  total) by mouth daily. 30 tablet 2 05/26/2021   [START ON 05/28/2021] CYCLOPHOSPHAMIDE IV Inject into the vein every 21 ( twenty-one) days.   05/26/2021   [START ON 05/28/2021] DOCEtaxel (TAXOTERE IV) Inject into the vein every 21 ( twenty-one) days.   05/26/2021   FLUoxetine (PROZAC) 20 MG capsule Take 1 capsule (20 mg total) by mouth daily. 30 capsule 2    lidocaine-prilocaine (EMLA) cream Apply a small amount to port a cath site (do not rub in) and cover with plastic wrap 1 hour prior to chemotherapy appointments 30 g 3 05/26/2021   lisinopril (ZESTRIL) 10 MG tablet Take 1 tablet (10 mg total) by mouth daily with lunch. 30 tablet 2 05/26/2021   omeprazole (PRILOSEC) 20 MG capsule Take 1 capsule (20 mg total) by mouth daily as needed (indigestion/heartburn). (Patient taking differently: Take 20 mg by mouth daily.) 30 capsule 2 05/26/2021   prochlorperazine (COMPAZINE) 10 MG tablet Take 1 tablet (10 mg total) by mouth every 6 (six) hours as needed (Nausea or vomiting). 30 tablet 1 05/27/2021   vitamin B-12 (CYANOCOBALAMIN) 500 MCG tablet Take 500 mcg by mouth daily.   05/26/2021   acetaminophen (TYLENOL) 325 MG tablet  Take 325 mg by mouth every 6 (six) hours as needed for moderate pain or headache.   Unknown   Scheduled:  Chlorhexidine Gluconate Cloth  6 each Topical Once   And   Chlorhexidine Gluconate Cloth  6 each Topical Once   Continuous:  lactated ringers 50 mL/hr at 05/27/21 0703   vancomycin 1,000 mg (05/27/21 0705)   PRN: Allergies  Allergen Reactions   Ibuprofen Other (See Comments)    "Stomach upset"   Penicillins     Allergic as a child. (Rash ???) Does not recall what type of reaction she has to drug.      ROS:  A comprehensive review of systems was negative except for: Ears, nose, mouth, throat, and face: positive for sinus drainage Integument/breast: positive for left breast cancer Musculoskeletal: positive for neck pain  RR 15, HR 62, BP 141/82 Sat 100% , pain 0    Physical Exam Vitals reviewed.  Constitutional:      Appearance: Normal appearance.  HENT:     Mouth/Throat:     Mouth: Mucous membranes are moist.  Eyes:     Extraocular Movements: Extraocular movements intact.  Cardiovascular:     Rate and Rhythm: Normal rate.  Pulmonary:     Effort: Pulmonary effort is normal.  Chest:     Comments: Left mastectomy incision healed Abdominal:     General: There is no distension.     Palpations: Abdomen is soft.     Tenderness: There is no abdominal tenderness.  Musculoskeletal:        General: Normal range of motion.  Skin:    General: Skin is warm.  Neurological:     General: No focal deficit present.     Mental Status: She is alert and oriented to person, place, and time.  Psychiatric:        Mood and Affect: Mood normal.        Behavior: Behavior normal.    Results: None   Assessment & Plan:  Samantha Mcfarland is a 65 y.o. adult with left breast cancer who will need a port.  Discussed risk of bleeding, infection pneumothorax with patient.   All questions were answered to the satisfaction of the patient.      Virl Cagey 05/27/2021, 7:25 AM

## 2021-05-27 NOTE — Anesthesia Postprocedure Evaluation (Signed)
Anesthesia Post Note  Patient: Samantha Mcfarland  Procedure(s) Performed: INSERTION PORT-A-CATH (Right: Chest) EXCISION OF RIGHT SKIN TAG (Right: Chest)  Patient location during evaluation: Phase II Anesthesia Type: General Level of consciousness: awake and alert and oriented Pain management: pain level controlled Vital Signs Assessment: post-procedure vital signs reviewed and stable Respiratory status: spontaneous breathing, nonlabored ventilation and respiratory function stable Cardiovascular status: blood pressure returned to baseline and stable Postop Assessment: no apparent nausea or vomiting Anesthetic complications: no   No notable events documented.   Last Vitals:  Vitals:   05/27/21 0826  BP: 111/73  Pulse: 66  Resp: 17  Temp: 36.5 C  SpO2: 99%    Last Pain:  Vitals:   05/27/21 0826  TempSrc: Oral  PainSc: 0-No pain                 Austina Constantin C Breckyn Ticas

## 2021-05-28 ENCOUNTER — Inpatient Hospital Stay (HOSPITAL_COMMUNITY): Payer: Medicare HMO

## 2021-05-28 ENCOUNTER — Telehealth (HOSPITAL_COMMUNITY): Payer: Medicare HMO | Admitting: Genetic Counselor

## 2021-05-28 ENCOUNTER — Inpatient Hospital Stay (HOSPITAL_BASED_OUTPATIENT_CLINIC_OR_DEPARTMENT_OTHER): Payer: Medicare HMO | Admitting: Hematology

## 2021-05-28 VITALS — BP 126/81 | HR 67 | Temp 97.1°F | Resp 18 | Ht 67.0 in | Wt 122.7 lb

## 2021-05-28 DIAGNOSIS — Z170 Estrogen receptor positive status [ER+]: Secondary | ICD-10-CM

## 2021-05-28 DIAGNOSIS — M8580 Other specified disorders of bone density and structure, unspecified site: Secondary | ICD-10-CM | POA: Diagnosis not present

## 2021-05-28 DIAGNOSIS — Z95828 Presence of other vascular implants and grafts: Secondary | ICD-10-CM | POA: Diagnosis not present

## 2021-05-28 DIAGNOSIS — T451X5A Adverse effect of antineoplastic and immunosuppressive drugs, initial encounter: Secondary | ICD-10-CM | POA: Diagnosis not present

## 2021-05-28 DIAGNOSIS — Z79899 Other long term (current) drug therapy: Secondary | ICD-10-CM | POA: Diagnosis not present

## 2021-05-28 DIAGNOSIS — D6481 Anemia due to antineoplastic chemotherapy: Secondary | ICD-10-CM | POA: Diagnosis not present

## 2021-05-28 DIAGNOSIS — Z5111 Encounter for antineoplastic chemotherapy: Secondary | ICD-10-CM | POA: Diagnosis not present

## 2021-05-28 DIAGNOSIS — C50912 Malignant neoplasm of unspecified site of left female breast: Secondary | ICD-10-CM

## 2021-05-28 DIAGNOSIS — Z5189 Encounter for other specified aftercare: Secondary | ICD-10-CM | POA: Diagnosis not present

## 2021-05-28 DIAGNOSIS — E871 Hypo-osmolality and hyponatremia: Secondary | ICD-10-CM | POA: Diagnosis not present

## 2021-05-28 DIAGNOSIS — C50012 Malignant neoplasm of nipple and areola, left female breast: Secondary | ICD-10-CM | POA: Diagnosis not present

## 2021-05-28 DIAGNOSIS — Z87891 Personal history of nicotine dependence: Secondary | ICD-10-CM | POA: Diagnosis not present

## 2021-05-28 LAB — COMPREHENSIVE METABOLIC PANEL
ALT: 13 U/L (ref 0–44)
AST: 25 U/L (ref 15–41)
Albumin: 4.3 g/dL (ref 3.5–5.0)
Alkaline Phosphatase: 45 U/L (ref 38–126)
Anion gap: 7 (ref 5–15)
BUN: 5 mg/dL — ABNORMAL LOW (ref 8–23)
CO2: 24 mmol/L (ref 22–32)
Calcium: 9.2 mg/dL (ref 8.9–10.3)
Chloride: 100 mmol/L (ref 98–111)
Creatinine, Ser: 0.57 mg/dL (ref 0.44–1.00)
GFR, Estimated: >60 mL/min (ref >60)
Glucose, Bld: 80 mg/dL (ref 70–99)
Potassium: 4 mmol/L (ref 3.5–5.1)
Sodium: 131 mmol/L — ABNORMAL LOW (ref 135–145)
Total Bilirubin: 0.7 mg/dL (ref 0.3–1.2)
Total Protein: 6.9 g/dL (ref 6.5–8.1)

## 2021-05-28 LAB — CBC WITH DIFFERENTIAL/PLATELET
Abs Immature Granulocytes: 0.03 10*3/uL (ref 0.00–0.07)
Basophils Absolute: 0 10*3/uL (ref 0.0–0.1)
Basophils Relative: 1 %
Eosinophils Absolute: 0.2 10*3/uL (ref 0.0–0.5)
Eosinophils Relative: 3 %
HCT: 33.7 % — ABNORMAL LOW (ref 36.0–46.0)
Hemoglobin: 11.3 g/dL — ABNORMAL LOW (ref 12.0–15.0)
Immature Granulocytes: 1 %
Lymphocytes Relative: 34 %
Lymphs Abs: 2.2 10*3/uL (ref 0.7–4.0)
MCH: 33.6 pg (ref 26.0–34.0)
MCHC: 33.5 g/dL (ref 30.0–36.0)
MCV: 100.3 fL — ABNORMAL HIGH (ref 80.0–100.0)
Monocytes Absolute: 0.6 10*3/uL (ref 0.1–1.0)
Monocytes Relative: 9 %
Neutro Abs: 3.5 10*3/uL (ref 1.7–7.7)
Neutrophils Relative %: 52 %
Platelets: 321 10*3/uL (ref 150–400)
RBC: 3.36 MIL/uL — ABNORMAL LOW (ref 3.87–5.11)
RDW: 13.2 % (ref 11.5–15.5)
WBC: 6.5 10*3/uL (ref 4.0–10.5)
nRBC: 0 % (ref 0.0–0.2)

## 2021-05-28 LAB — MAGNESIUM: Magnesium: 1.9 mg/dL (ref 1.7–2.4)

## 2021-05-28 LAB — SURGICAL PATHOLOGY

## 2021-05-28 MED ORDER — SODIUM CHLORIDE 0.9 % IV SOLN
480.0000 mg/m2 | Freq: Once | INTRAVENOUS | Status: AC
  Administered 2021-05-28: 780 mg via INTRAVENOUS
  Filled 2021-05-28: qty 39

## 2021-05-28 MED ORDER — PALONOSETRON HCL INJECTION 0.25 MG/5ML
0.2500 mg | Freq: Once | INTRAVENOUS | Status: AC
  Administered 2021-05-28: 0.25 mg via INTRAVENOUS
  Filled 2021-05-28: qty 5

## 2021-05-28 MED ORDER — HEPARIN SOD (PORK) LOCK FLUSH 100 UNIT/ML IV SOLN
500.0000 [IU] | Freq: Once | INTRAVENOUS | Status: AC | PRN
  Administered 2021-05-28: 500 [IU]

## 2021-05-28 MED ORDER — SODIUM CHLORIDE 0.9 % IV SOLN: Freq: Once | INTRAVENOUS | Status: AC

## 2021-05-28 MED ORDER — SODIUM CHLORIDE 0.9 % IV SOLN
10.0000 mg | Freq: Once | INTRAVENOUS | Status: AC
  Administered 2021-05-28: 10 mg via INTRAVENOUS
  Filled 2021-05-28: qty 10

## 2021-05-28 MED ORDER — SODIUM CHLORIDE 0.9 % IV SOLN
60.0000 mg/m2 | Freq: Once | INTRAVENOUS | Status: AC
  Administered 2021-05-28: 100 mg via INTRAVENOUS
  Filled 2021-05-28: qty 10

## 2021-05-28 MED ORDER — SODIUM CHLORIDE 0.9% FLUSH
10.0000 mL | INTRAVENOUS | Status: DC | PRN
  Administered 2021-05-28: 10 mL

## 2021-05-28 NOTE — Progress Notes (Signed)
Patient has been examined, vital signs and labs have been reviewed by Dr. Katragadda. ANC, Creatinine, LFTs, hemoglobin, and platelets are within treatment parameters per Dr. Katragadda. Patient may proceed with treatment per M.D.   

## 2021-05-28 NOTE — Patient Instructions (Addendum)
[place] at [facility] Discharge Instructions   You were seen and examined today by Dr. Delton Coombes.  We will proceed with your first treatment today. We will start out at a slightly lower dose today for treatment to see how you tolerate it, then next treatment give you the full dose.   Try using Boost or Ensure to supplement your diet.   Return as scheduled for lab work, office visit, and treatment.      Thank you for choosing [place] at [facility] to provide your oncology and hematology care.  To afford each patient quality time with our provider, please arrive at least 15 minutes before your scheduled appointment time.   If you have a lab appointment with the [place] please come in thru the Main Entrance and check in at the main information desk.  You need to re-schedule your appointment should you arrive 10 or more minutes late.  We strive to give you quality time with our providers, and arriving late affects you and other patients whose appointments are after yours.  Also, if you no show three or more times for appointments you may be dismissed from the clinic at the providers discretion.     Again, thank you for choosing [facility].  Our hope is that these requests will decrease the amount of time that you wait before being seen by our physicians.       _____________________________________________________________  Should you have questions after your visit to [facility], please contact our office at [phone] and follow the prompts.  Our office hours are 8:00 a.m. and 4:30 p.m. Monday - Friday.  Please note that voicemails left after 4:00 p.m. may not be returned until the following business day.  We are closed weekends and major holidays.  You do have access to a nurse 24-7, just call the main number to the clinic [phone] and do not press any options, hold on the line and a nurse will answer  the phone.    For prescription refill requests, have your pharmacy contact our office and allow 72 hours.    Due to Covid, you will need to wear a mask upon entering the hospital. If you do not have a mask, a mask will be given to you at the Main Entrance upon arrival. For doctor visits, patients may have 1 support person age 65 or older with them. For treatment visits, patients can not have anyone with them due to social distancing guidelines and our immunocompromised population.

## 2021-05-28 NOTE — Progress Notes (Signed)
She is good for tx today.  He dose-reduced today by 20% and plans to give full dose next time if she tolerates okay.

## 2021-05-28 NOTE — Patient Instructions (Signed)
[place]  Discharge Instructions: Thank you for choosing [place] to provide your oncology and hematology care.  If you have a lab appointment with the [place], please come in thru the Main Entrance and check in at the main information desk.  Wear comfortable clothing and clothing appropriate for easy access to any Portacath or PICC line.   We strive to give you quality time with your provider. You may need to reschedule your appointment if you arrive late (15 or more minutes).  Arriving late affects you and other patients whose appointments are after yours.  Also, if you miss three or more appointments without notifying the office, you may be dismissed from the clinic at the provider's discretion.      For prescription refill requests, have your pharmacy contact our office and allow 72 hours for refills to be completed.    Today you received the following chemotherapy and/or immunotherapy agents C1D1 Docetaxel and Cytoxan.   To help prevent nausea and vomiting after your treatment, we encourage you to take your nausea medication as directed.  BELOW ARE SYMPTOMS THAT SHOULD BE REPORTED IMMEDIATELY: *FEVER GREATER THAN 100.4 F (38 C) OR HIGHER *CHILLS OR SWEATING *NAUSEA AND VOMITING THAT IS NOT CONTROLLED WITH YOUR NAUSEA MEDICATION *UNUSUAL SHORTNESS OF BREATH *UNUSUAL BRUISING OR BLEEDING *URINARY PROBLEMS (pain or burning when urinating, or frequent urination) *BOWEL PROBLEMS (unusual diarrhea, constipation, pain near the anus) TENDERNESS IN MOUTH AND THROAT WITH OR WITHOUT PRESENCE OF ULCERS (sore throat, sores in mouth, or a toothache) UNUSUAL RASH, SWELLING OR PAIN  UNUSUAL VAGINAL DISCHARGE OR ITCHING   Items with * indicate a potential emergency and should be followed up as soon as possible or go to the Emergency Department if any problems should occur.  Please show the CHEMOTHERAPY ALERT CARD or IMMUNOTHERAPY ALERT CARD at check-in to the  Emergency Department and triage nurse.  Should you have questions after your visit or need to cancel or reschedule your appointment, please contact [facility] [phone]  and follow the prompts.  Office hours are 8:00 a.m. to 4:30 p.m. Monday - Friday. Please note that voicemails left after 4:00 p.m. may not be returned until the following business day.  We are closed weekends and major holidays. You have access to a nurse at all times for urgent questions. Please call the main number to the clinic [phone] and follow the prompts.  For any non-urgent questions, you may also contact your provider using MyChart. We now offer e-Visits for anyone 81 and older to request care online for non-urgent symptoms. For details visit [email or web address].   Also download the MyChart app! Go to the app store, search "MyChart", open the app, select [place], and log in with your MyChart username and password.  Due to Covid, a mask is required upon entering the hospital/clinic. If you do not have a mask, one will be given to you upon arrival. For doctor visits, patients may have 1 support person aged 21 or older with them. For treatment visits, patients cannot have anyone with them due to current Covid guidelines and our immunocompromised population.

## 2021-05-28 NOTE — Progress Notes (Signed)
Pt presents today for D1C1 Docetaxel and Cytoxan per provider's order. Vital signs stable and pt voiced no new complaints at this time. Dose-reduced today by 20% and plans to give full dose next time if she tolerates okay per Dr.K.  D1C1 Docetaxel and Cytoxan  given today per MD orders. Tolerated infusion without adverse affects. Vital signs stable. No complaints at this time. Discharged from clinic ambulatory via cane in stable condition. Alert and oriented x 3. F/U with [facility] as scheduled.

## 2021-05-28 NOTE — Progress Notes (Signed)
Pharmacist Chemotherapy Monitoring - Initial Assessment    Anticipated start date: 05/28/21   The following has been reviewed per standard work regarding the patient's treatment regimen: The patient's diagnosis, treatment plan and drug doses, and organ/hematologic function Lab orders and baseline tests specific to treatment regimen  The treatment plan start date, drug sequencing, and pre-medications Prior authorization status  Patient's documented medication list, including drug-drug interaction screen and prescriptions for anti-emetics and supportive care specific to the treatment regimen The drug concentrations, fluid compatibility, administration routes, and timing of the medications to be used The patient's access for treatment and lifetime cumulative dose history, if applicable  The patient's medication allergies and previous infusion related reactions, if applicable   Changes made to treatment plan:  N/A  Follow up needed:  N/A   Wynona Neat, [facility], 05/28/2021  9:52 AM

## 2021-05-29 ENCOUNTER — Other Ambulatory Visit (HOSPITAL_COMMUNITY): Payer: Self-pay | Admitting: *Deleted

## 2021-05-29 ENCOUNTER — Encounter (HOSPITAL_COMMUNITY): Payer: Self-pay | Admitting: General Surgery

## 2021-05-29 ENCOUNTER — Inpatient Hospital Stay (HOSPITAL_COMMUNITY): Payer: Medicare HMO

## 2021-05-29 ENCOUNTER — Other Ambulatory Visit: Payer: Self-pay

## 2021-05-29 VITALS — BP 113/68 | HR 69 | Temp 97.1°F | Resp 18

## 2021-05-29 DIAGNOSIS — T451X5A Adverse effect of antineoplastic and immunosuppressive drugs, initial encounter: Secondary | ICD-10-CM | POA: Diagnosis not present

## 2021-05-29 DIAGNOSIS — Z95828 Presence of other vascular implants and grafts: Secondary | ICD-10-CM

## 2021-05-29 DIAGNOSIS — Z170 Estrogen receptor positive status [ER+]: Secondary | ICD-10-CM

## 2021-05-29 DIAGNOSIS — D6481 Anemia due to antineoplastic chemotherapy: Secondary | ICD-10-CM | POA: Diagnosis not present

## 2021-05-29 DIAGNOSIS — C50012 Malignant neoplasm of nipple and areola, left female breast: Secondary | ICD-10-CM | POA: Diagnosis not present

## 2021-05-29 DIAGNOSIS — C50912 Malignant neoplasm of unspecified site of left female breast: Secondary | ICD-10-CM

## 2021-05-29 DIAGNOSIS — Z5111 Encounter for antineoplastic chemotherapy: Secondary | ICD-10-CM | POA: Diagnosis not present

## 2021-05-29 DIAGNOSIS — M8580 Other specified disorders of bone density and structure, unspecified site: Secondary | ICD-10-CM | POA: Diagnosis not present

## 2021-05-29 DIAGNOSIS — E871 Hypo-osmolality and hyponatremia: Secondary | ICD-10-CM | POA: Diagnosis not present

## 2021-05-29 DIAGNOSIS — Z87891 Personal history of nicotine dependence: Secondary | ICD-10-CM | POA: Diagnosis not present

## 2021-05-29 DIAGNOSIS — Z5189 Encounter for other specified aftercare: Secondary | ICD-10-CM | POA: Diagnosis not present

## 2021-05-29 DIAGNOSIS — Z79899 Other long term (current) drug therapy: Secondary | ICD-10-CM | POA: Diagnosis not present

## 2021-05-29 MED ORDER — PEGFILGRASTIM-CBQV 6 MG/0.6ML ~~LOC~~ SOSY
6.0000 mg | PREFILLED_SYRINGE | Freq: Once | SUBCUTANEOUS | Status: AC
  Administered 2021-05-29: 6 mg via SUBCUTANEOUS
  Filled 2021-05-29: qty 0.6

## 2021-05-29 NOTE — Progress Notes (Signed)
24 hour chemotherapy follow-up done today. Pt stated she felt lightheaded this morning. Pt advised to sit and stand slowly. Pt voiced no other complaints at this time. . Pt advised to call the clinic if needed and pt verbalized understanding.

## 2021-05-29 NOTE — Patient Instructions (Signed)
[facility]  Discharge Instructions: Thank you for choosing [facility] to provide your oncology and hematology care.  If you have a lab appointment with the Cancer Center, please come in thru the Main Entrance and check in at the main information desk.  Wear comfortable clothing and clothing appropriate for easy access to any Portacath or PICC line.   We strive to give you quality time with your provider. You may need to reschedule your appointment if you arrive late (15 or more minutes).  Arriving late affects you and other patients whose appointments are after yours.  Also, if you miss three or more appointments without notifying the office, you may be dismissed from the clinic at the provider's discretion.      For prescription refill requests, have your pharmacy contact our office and allow 72 hours for refills to be completed.    Today you received the following chemotherapy and/or immunotherapy agents Udenyca      To help prevent nausea and vomiting after your treatment, we encourage you to take your nausea medication as directed.  BELOW ARE SYMPTOMS THAT SHOULD BE REPORTED IMMEDIATELY: *FEVER GREATER THAN 100.4 F (38 C) OR HIGHER *CHILLS OR SWEATING *NAUSEA AND VOMITING THAT IS NOT CONTROLLED WITH YOUR NAUSEA MEDICATION *UNUSUAL SHORTNESS OF BREATH *UNUSUAL BRUISING OR BLEEDING *URINARY PROBLEMS (pain or burning when urinating, or frequent urination) *BOWEL PROBLEMS (unusual diarrhea, constipation, pain near the anus) TENDERNESS IN MOUTH AND THROAT WITH OR WITHOUT PRESENCE OF ULCERS (sore throat, sores in mouth, or a toothache) UNUSUAL RASH, SWELLING OR PAIN  UNUSUAL VAGINAL DISCHARGE OR ITCHING   Items with * indicate a potential emergency and should be followed up as soon as possible or go to the Emergency Department if any problems should occur.  Please show the CHEMOTHERAPY ALERT CARD or IMMUNOTHERAPY ALERT CARD at check-in to the Emergency  Department and triage nurse.  Should you have questions after your visit or need to cancel or reschedule your appointment, please contact [facility] [phone]  and follow the prompts.  Office hours are 8:00 a.m. to 4:30 p.m. Monday - Friday. Please note that voicemails left after 4:00 p.m. may not be returned until the following business day.  We are closed weekends and major holidays. You have access to a nurse at all times for urgent questions. Please call the main number to the clinic [phone] and follow the prompts.  For any non-urgent questions, you may also contact your provider using MyChart. We now offer e-Visits for anyone 18 and older to request care online for non-urgent symptoms. For details visit [email or web address].   Also download the MyChart app! Go to the app store, search "MyChart", open the app, select , and log in with your MyChart username and password.  Due to Covid, a mask is required upon entering the hospital/clinic. If you do not have a mask, one will be given to you upon arrival. For doctor visits, patients may have 1 support person aged 18 or older with them. For treatment visits, patients cannot have anyone with them due to current Covid guidelines and our immunocompromised population.  

## 2021-05-29 NOTE — Progress Notes (Signed)
Samantha Mcfarland presents today for Udenyca injection per the provider's orders.  Stable during administration without incident; injection site WNL; see MAR for injection details.  Patient tolerated procedure well and without incident.  No questions or complaints noted at this time. Discharge from clinic ambulatory in stable condition.  Alert and oriented X 3.  Follow up with [facility] as scheduled.

## 2021-06-01 ENCOUNTER — Telehealth: Payer: Self-pay | Admitting: *Deleted

## 2021-06-01 NOTE — Telephone Encounter (Signed)
Received call from patient [phone] telephone.   Surgical Date:05/27/2021 Procedure:Port-A-Cath insertion  Concerns: Patient reports that catheter insertion site feels swollen and "puffed up". Denies pain, but does endorse itching at site. Denies drainage, warmth to surrounding skin. Patient does report that she is unable to visualized site well as she has cataracts.   Requested appointment for MD to evaluate. Appointment scheduled.   Advised if area becomes painful, warm to touch or red, contact office for further information.

## 2021-06-02 ENCOUNTER — Encounter: Payer: Self-pay | Admitting: General Surgery

## 2021-06-02 ENCOUNTER — Other Ambulatory Visit: Payer: Self-pay

## 2021-06-02 ENCOUNTER — Ambulatory Visit (INDEPENDENT_AMBULATORY_CARE_PROVIDER_SITE_OTHER): Payer: Medicare HMO | Admitting: General Surgery

## 2021-06-02 VITALS — BP 92/58 | HR 74 | Temp 99.2°F | Resp 14 | Ht 67.0 in | Wt 124.0 lb

## 2021-06-02 DIAGNOSIS — Z95828 Presence of other vascular implants and grafts: Secondary | ICD-10-CM

## 2021-06-02 NOTE — Patient Instructions (Signed)
Port site looks good. Monitor for any spreading redness. Keep eye on temperature. If over 100.4 call the Oncology Office.

## 2021-06-03 NOTE — Progress Notes (Signed)
[facility] site with some irritation/ capillary stretch but no signs of infection erythema. She is very thin and the port looks appropriate. Reassured her and her sister.   BP (!) 92/58    Pulse 74    Temp 99.2 F (37.3 C) (Oral)    Resp 14    Ht 5\' 7"  (1.702 m)    Wt 124 lb (56.2 kg)    SpO2 94%    BMI 19.42 kg/m   Having temp to 99.2. Called Oncology and they told us that it is ok unless 100.4 or greater.  Patient and family updated.  PRN follow up  Monitor port site for signs of infection   [name], MD [facility] [place], [place] [id number] [phone] (office)

## 2021-06-04 ENCOUNTER — Other Ambulatory Visit: Payer: Self-pay

## 2021-06-04 ENCOUNTER — Inpatient Hospital Stay (HOSPITAL_COMMUNITY): Payer: Medicare HMO

## 2021-06-04 ENCOUNTER — Inpatient Hospital Stay (HOSPITAL_BASED_OUTPATIENT_CLINIC_OR_DEPARTMENT_OTHER): Payer: Medicare HMO | Admitting: Physician Assistant

## 2021-06-04 VITALS — BP 100/69 | HR 72 | Temp 97.8°F | Resp 18 | Ht 64.96 in | Wt 126.1 lb

## 2021-06-04 DIAGNOSIS — M2550 Pain in unspecified joint: Secondary | ICD-10-CM

## 2021-06-04 DIAGNOSIS — C50912 Malignant neoplasm of unspecified site of left female breast: Secondary | ICD-10-CM

## 2021-06-04 DIAGNOSIS — Z79899 Other long term (current) drug therapy: Secondary | ICD-10-CM | POA: Diagnosis not present

## 2021-06-04 DIAGNOSIS — G8918 Other acute postprocedural pain: Secondary | ICD-10-CM

## 2021-06-04 DIAGNOSIS — Z5189 Encounter for other specified aftercare: Secondary | ICD-10-CM | POA: Diagnosis not present

## 2021-06-04 DIAGNOSIS — M8580 Other specified disorders of bone density and structure, unspecified site: Secondary | ICD-10-CM | POA: Diagnosis not present

## 2021-06-04 DIAGNOSIS — C50012 Malignant neoplasm of nipple and areola, left female breast: Secondary | ICD-10-CM | POA: Diagnosis not present

## 2021-06-04 DIAGNOSIS — T451X5A Adverse effect of antineoplastic and immunosuppressive drugs, initial encounter: Secondary | ICD-10-CM | POA: Diagnosis not present

## 2021-06-04 DIAGNOSIS — Z170 Estrogen receptor positive status [ER+]: Secondary | ICD-10-CM | POA: Diagnosis not present

## 2021-06-04 DIAGNOSIS — Z87891 Personal history of nicotine dependence: Secondary | ICD-10-CM | POA: Diagnosis not present

## 2021-06-04 DIAGNOSIS — Z5111 Encounter for antineoplastic chemotherapy: Secondary | ICD-10-CM | POA: Diagnosis not present

## 2021-06-04 DIAGNOSIS — Z09 Encounter for follow-up examination after completed treatment for conditions other than malignant neoplasm: Secondary | ICD-10-CM | POA: Diagnosis not present

## 2021-06-04 DIAGNOSIS — E871 Hypo-osmolality and hyponatremia: Secondary | ICD-10-CM | POA: Diagnosis not present

## 2021-06-04 DIAGNOSIS — D6481 Anemia due to antineoplastic chemotherapy: Secondary | ICD-10-CM | POA: Diagnosis not present

## 2021-06-04 LAB — CBC WITH DIFFERENTIAL/PLATELET
Band Neutrophils: 12 %
Basophils Absolute: 0 10*3/uL (ref 0.0–0.1)
Basophils Relative: 0 %
Blasts: 1 %
Eosinophils Absolute: 0.3 10*3/uL (ref 0.0–0.5)
Eosinophils Relative: 2 %
HCT: 31.5 % — ABNORMAL LOW (ref 36.0–46.0)
Hemoglobin: 10.8 g/dL — ABNORMAL LOW (ref 12.0–15.0)
Lymphocytes Relative: 17 %
Lymphs Abs: 2.8 10*3/uL (ref 0.7–4.0)
MCH: 34.3 pg — ABNORMAL HIGH (ref 26.0–34.0)
MCHC: 34.3 g/dL (ref 30.0–36.0)
MCV: 100 fL (ref 80.0–100.0)
Monocytes Absolute: 0.5 10*3/uL (ref 0.1–1.0)
Monocytes Relative: 3 %
Myelocytes: 1 %
Neutro Abs: 11.4 10*3/uL — ABNORMAL HIGH (ref 1.7–7.7)
Neutrophils Relative %: 56 %
Platelets: 289 10*3/uL (ref 150–400)
Promyelocytes Relative: 8 %
RBC: 3.15 MIL/uL — ABNORMAL LOW (ref 3.87–5.11)
RDW: 12.7 % (ref 11.5–15.5)
WBC: 16.7 10*3/uL — ABNORMAL HIGH (ref 4.0–10.5)
nRBC: 0 % (ref 0.0–0.2)

## 2021-06-04 LAB — COMPREHENSIVE METABOLIC PANEL
ALT: 13 U/L (ref 0–44)
AST: 27 U/L (ref 15–41)
Albumin: 4 g/dL (ref 3.5–5.0)
Alkaline Phosphatase: 71 U/L (ref 38–126)
Anion gap: 10 (ref 5–15)
BUN: 5 mg/dL — ABNORMAL LOW (ref 8–23)
CO2: 24 mmol/L (ref 22–32)
Calcium: 9.1 mg/dL (ref 8.9–10.3)
Chloride: 93 mmol/L — ABNORMAL LOW (ref 98–111)
Creatinine, Ser: 0.71 mg/dL (ref 0.44–1.00)
GFR, Estimated: >60 mL/min (ref >60)
Glucose, Bld: 114 mg/dL — ABNORMAL HIGH (ref 70–99)
Potassium: 4 mmol/L (ref 3.5–5.1)
Sodium: 127 mmol/L — ABNORMAL LOW (ref 135–145)
Total Bilirubin: 0.4 mg/dL (ref 0.3–1.2)
Total Protein: 6.7 g/dL (ref 6.5–8.1)

## 2021-06-04 LAB — MAGNESIUM: Magnesium: 1.7 mg/dL (ref 1.7–2.4)

## 2021-06-04 MED ORDER — TRAMADOL HCL 50 MG PO TABS: 50.0000 mg | ORAL_TABLET | Freq: Four times a day (QID) | ORAL | 0 refills | Status: DC | PRN

## 2021-06-04 MED ORDER — SODIUM CHLORIDE 0.9% FLUSH
10.0000 mL | Freq: Once | INTRAVENOUS | Status: AC
  Administered 2021-06-04: 10 mL via INTRAVENOUS

## 2021-06-04 MED ORDER — HEPARIN SOD (PORK) LOCK FLUSH 100 UNIT/ML IV SOLN
500.0000 [IU] | Freq: Once | INTRAVENOUS | Status: AC
  Administered 2021-06-04: 500 [IU] via INTRAVENOUS

## 2021-06-04 NOTE — Progress Notes (Signed)
[address]antha S[address], [place] [address] Phone: [phone] Fax: [place] PROGRESS NOTE   Samantha Mcfarland [id number] February 13, 1956 65 y.o.  Samantha Mcfarland is managed by Dr. Delton Coombes for stage I left breast cancer.  Actively treated with chemotherapy/immunotherapy/hormonal therapy: yes  Current therapy: Taxotere & Cytoxan every 3 weeks  Last treated: 05/28/2021 (cycle #1)  Next scheduled appointment with provider: 06/18/2021  Subjective:  Chief Complaint: Symptom check after first cycle of chemotherapy  Samantha Mcfarland is managed by Dr. Delton Coombes for her stage I left-sided breast cancer.  She underwent her first cycle of Taxotere and Cytoxan last week on 05/28/2021.  She  reports that she  tolerated her  chemotherapy infusions fairly well.  She reports that she felt "great" on the day of chemotherapy, but felt fatigued and sore the following day.  Her chief complaint today is generalized body aches and joint pain, particularly in her legs, back and neck.  She has been taking Claritin and Tylenol, but without relief.  She reports that her pain is maximum 8/10 aching soreness.  She is having difficulty sleeping at night secondary to the pain.  She had 1 episode of diarrhea 2 to 3 days after her chemotherapy.  This was followed by constipation, which she reports is "normal" for her.  She is taking stool softeners with some relief. She denies any mouth sores. Appetite has been poor after chemotherapy, but she is starting to recover her appetite over the last 1 to 2 days. She drinks about 48 ounces of water per day. No rashes or skin changes. No changes in urinary output. No peripheral edema, chest pain, shortness of breath, dyspnea on exertion. She denies any neuropathy. She has not noticed any easy bruising or bleeding.  She is not having any fever, chills, night sweats.  She  has 25% energy and 25% appetite.  Her  weight has  been stable compared to her visit last week.   Review of Systems:  Review of Systems  Constitutional:  Positive for activity change, appetite change and fatigue. Negative for chills, diaphoresis, fever and unexpected weight change.  HENT:  Negative for mouth sores and trouble swallowing.   Respiratory:  Negative for cough and shortness of breath.   Cardiovascular:  Negative for chest pain and leg swelling.  Gastrointestinal:  Positive for constipation and diarrhea. Negative for abdominal pain, nausea and vomiting.  Genitourinary:  Negative for frequency.  Musculoskeletal:  Positive for arthralgias, back pain, myalgias and neck pain.  Skin:  Negative for rash.  Neurological:  Positive for headaches.  Psychiatric/Behavioral:  Positive for sleep disturbance. The patient is nervous/anxious.     Past Medical History, Surgical history, Social history, and Family history were reviewed as documented elsewhere in chart, and were updated as appropriate.   Objective:   Physical Exam:  BP 100/69 (BP Location: Right Arm, Patient Position: Sitting)    Pulse 72    Temp 97.8 F (36.6 C) (Tympanic)    Resp 18    Ht 5' 4.96" (1.65 m)    Wt 126 lb 1.7 oz (57.2 kg)    SpO2 95%    BMI 21.01 kg/m  ECOG: 1  Physical Exam Constitutional:      Appearance: Normal appearance.     Comments: Thin body habitus.  Right-sided chest port in place.  HENT:     Head: Normocephalic and atraumatic.     Mouth/Throat:     Mouth: Mucous membranes are  moist.  Eyes:     Extraocular Movements: Extraocular movements intact.     Pupils: Pupils are equal, round, and reactive to light.  Cardiovascular:     Rate and Rhythm: Normal rate and regular rhythm.     Pulses: Normal pulses.     Heart sounds: Normal heart sounds.  Pulmonary:     Effort: Pulmonary effort is normal.     Breath sounds: Normal breath sounds.  Abdominal:     General: Bowel sounds are normal.     Palpations: Abdomen is soft.     Tenderness: There is  no abdominal tenderness.  Musculoskeletal:        General: No swelling.     Right lower leg: No edema.     Left lower leg: No edema.  Lymphadenopathy:     Cervical: No cervical adenopathy.  Skin:    General: Skin is warm and dry.  Neurological:     General: No focal deficit present.     Mental Status: She is alert and oriented to person, place, and time.  Psychiatric:        Mood and Affect: Mood normal.        Behavior: Behavior normal.    Lab Review:     Component Value Date/Time   NA 127 (L) 06/04/2021 0943   K 4.0 06/04/2021 0943   CL 93 (L) 06/04/2021 0943   CO2 24 06/04/2021 0943   GLUCOSE 114 (H) 06/04/2021 0943   BUN 5 (L) 06/04/2021 0943   CREATININE 0.71 06/04/2021 0943   CALCIUM 9.1 06/04/2021 0943   PROT 6.7 06/04/2021 0943   ALBUMIN 4.0 06/04/2021 0943   AST 27 06/04/2021 0943   ALT 13 06/04/2021 0943   ALKPHOS 71 06/04/2021 0943   BILITOT 0.4 06/04/2021 0943   GFRNONAA >60 06/04/2021 0943       Component Value Date/Time   WBC 16.7 (H) 06/04/2021 0943   RBC 3.15 (L) 06/04/2021 0943   HGB 10.8 (L) 06/04/2021 0943   HCT 31.5 (L) 06/04/2021 0943   PLT 289 06/04/2021 0943   MCV 100.0 06/04/2021 0943   MCH 34.3 (H) 06/04/2021 0943   MCHC 34.3 06/04/2021 0943   RDW 12.7 06/04/2021 0943   LYMPHSABS PENDING 06/04/2021 0943   MONOABS PENDING 06/04/2021 0943   EOSABS PENDING 06/04/2021 0943   BASOSABS PENDING 06/04/2021 0943   -------------------------------  Imaging from last 24 hours (if applicable):  Radiology interpretation: DG Chest Port 1 View  Result Date: 05/27/2021 CLINICAL DATA:  Port placement, history of breast cancer EXAM: PORTABLE CHEST 1 VIEW COMPARISON:  None. FINDINGS: There is a right chest wall port in place with the tip in the mid SVC. The cardiomediastinal silhouette is normal. There is no focal consolidation or pulmonary edema. There is no pleural effusion or pneumothorax. There is a remote fracture of the left eighth rib. Cervical  spine fusion hardware is noted. IMPRESSION: Right chest wall port in place with the tip in the mid SVC. No pneumothorax. Electronically Signed   By: Valetta Mole M.D.   On: 05/27/2021 08:48   DG C-Arm 1-60 Min-No Report  Result Date: 05/27/2021 Fluoroscopy was utilized by the requesting physician.  No radiographic interpretation.      Assessment & Plan:    1.  Stage I (T1CN0) left breast IDC, ER positive, HER2/PR negative: - Primary oncologist is Dr. Delton Coombes, further details per his most recent progress note - Patient received cycle #1 of Taxotere/Cytoxan on 05/28/2021 - She received  Udenyca injection on 05/29/2021 - Labs today (06/04/2021): WBC 16.7/ANC 11.4, Hgb 10.8, platelets 289; normal magnesium 1.7; normal potassium 4.0; hyponatremia noted with sodium 127, normal creatinine 0.71 - PLAN:  Follow-up as scheduled with Dr. Delton Coombes on 06/18/2021 for labs, MD visit, and cycle #2 of TC  2.  Hyponatremia - Sodium today moderately low at 127, likely in the setting of mild volume depletion.  Her baseline sodium is around 130, so she may have some underlying chronic hyponatremia. - She is drinking about 48 ounces of water each day. - She denies any increased urination. - PLAN: Encourage at least 64 ounces of water daily.  Encouraged electrolyte replacement with Pedialyte or similar.  Encouraged salty snacks.  3.  Body aches and arthralgia - Patient reports moderate to severe body aches and arthralgia following her TC chemotherapy and her Udenyca injection - She reports little to no relief from Tylenol, heating pads, and other conservative measures - Reassurance provided that this will be a self-limited side effect of her treatment and that it should go away within the next week - PLAN: Continue Claritin as prescribed.  Continue Tylenol for mild to moderate pain. - Prescription for tramadol 50 mg every 6 hours as needed for severe pain has been sent to pharmacy.  4.  Generalized weakness  and fatigue - Patient reports significant weakness and fatigue after receiving chemotherapy - She would benefit from shower chair to optimize safety and independence in bathing - PLAN: Prescription for shower chair has been sent to patient's pharmacy   All questions were answered. The patient knows to call the clinic with any problems, questions or concerns.  Medical decision making: Moderate  Time spent on visit: I spent 25 minutes counseling the patient face to face. The total time spent in the appointment was 40 minutes and more than 50% was on counseling.   Harriett Rush, PA-C  06/04/2021 10:26 PM

## 2021-06-04 NOTE — Progress Notes (Signed)
Script sent to [facility] for shower chair.

## 2021-06-04 NOTE — Progress Notes (Signed)
No fluids today per R. Pennington PA.  Discharge from clinic ambulatory in stable condition.  Alert and oriented X 3.  Follow up with [facility] as scheduled.

## 2021-06-04 NOTE — Patient Instructions (Signed)
[facility]  Discharge Instructions: Thank you for choosing [facility] to provide your oncology and hematology care.  If you have a lab appointment with the [facility], please come in thru the Main Entrance and check in at the main information desk.  Wear comfortable clothing and clothing appropriate for easy access to any Portacath or PICC line.   We strive to give you quality time with your provider. You may need to reschedule your appointment if you arrive late (15 or more minutes).  Arriving late affects you and other patients whose appointments are after yours.  Also, if you miss three or more appointments without notifying the office, you may be dismissed from the clinic at the providers discretion.      For prescription refill requests, have your pharmacy contact our office and allow 72 hours for refills to be completed.    Today you received the following chemotherapy and/or immunotherapy agents No fluids needed today      To help prevent nausea and vomiting after your treatment, we encourage you to take your nausea medication as directed.  BELOW ARE SYMPTOMS THAT SHOULD BE REPORTED IMMEDIATELY: *FEVER GREATER THAN 100.4 F (38 C) OR HIGHER *CHILLS OR SWEATING *NAUSEA AND VOMITING THAT IS NOT CONTROLLED WITH YOUR NAUSEA MEDICATION *UNUSUAL SHORTNESS OF BREATH *UNUSUAL BRUISING OR BLEEDING *URINARY PROBLEMS (pain or burning when urinating, or frequent urination) *BOWEL PROBLEMS (unusual diarrhea, constipation, pain near the anus) TENDERNESS IN MOUTH AND THROAT WITH OR WITHOUT PRESENCE OF ULCERS (sore throat, sores in mouth, or a toothache) UNUSUAL RASH, SWELLING OR PAIN  UNUSUAL VAGINAL DISCHARGE OR ITCHING   Items with * indicate a potential emergency and should be followed up as soon as possible or go to the Emergency Department if any problems should occur.  Please show the CHEMOTHERAPY ALERT CARD or IMMUNOTHERAPY ALERT CARD at check-in to the  Emergency Department and triage nurse.  Should you have questions after your visit or need to cancel or reschedule your appointment, please contact [facility] [phone]  and follow the prompts.  Office hours are 8:00 a.m. to 4:30 p.m. Monday - Friday. Please note that voicemails left after 4:00 p.m. may not be returned until the following business day.  We are closed weekends and major holidays. You have access to a nurse at all times for urgent questions. Please call the main number to the clinic [phone] and follow the prompts.  For any non-urgent questions, you may also contact your provider using MyChart. We now offer e-Visits for anyone 65 and older to request care online for non-urgent symptoms. For details visit [email or web address].   Also download the MyChart app! Go to the app store, search "MyChart", open the app, select [facility], and log in with your MyChart username and password.  Due to Covid, a mask is required upon entering the hospital/clinic. If you do not have a mask, one will be given to you upon arrival. For doctor visits, patients may have 1 support person aged 65 or older with them. For treatment visits, patients cannot have anyone with them due to current Covid guidelines and our immunocompromised population.

## 2021-06-04 NOTE — Patient Instructions (Addendum)
[place] [facility] at [facility] Discharge Instructions  You were seen today by Tarri Abernethy PA-C for your post-chemotherapy symptom check.  We will send a prescription for your shower chair to your pharmacy.  Your body aches are side effects of your chemotherapy and your urine like a shot (a shot you were given to help increase your blood count).  This should get better after a week or so.  Continue to take your Claritin daily, even if you do not feel that it is helping.  We will send you a prescription for tramadol 50 mg, which you should save for severe pain.  Your sodium is mildly low.  Make sure that you are drinking plenty of water and eating salty snacks to keep your sodium level up.  You can continue to drink Pedialyte each day to help improve your electrolytes.  FOLLOW-UP APPOINTMENT: Return as scheduled for labs, follow-up visit with Dr. Delton Coombes, and her next cycle of chemotherapy on 06/18/2021.     Thank you for choosing [facility] at [facility] to provide your oncology and hematology care.  To afford each patient quality time with our provider, please arrive at least 15 minutes before your scheduled appointment time.   If you have a lab appointment with the [facility] please come in thru the Main Entrance and check in at the main information desk.  You need to re-schedule your appointment should you arrive 10 or more minutes late.  We strive to give you quality time with our providers, and arriving late affects you and other patients whose appointments are after yours.  Also, if you no show three or more times for appointments you may be dismissed from the clinic at the providers discretion.     Again, thank you for choosing [facility].  Our hope is that these requests will decrease the amount of time that you wait before being seen by our physicians.        _____________________________________________________________  Should you have questions after your visit to [facility], please contact our office at [phone] and follow the prompts.  Our office hours are 8:00 a.m. and 4:30 p.m. Monday - Friday.  Please note that voicemails left after 4:00 p.m. may not be returned until the following business day.  We are closed weekends and major holidays.  You do have access to a nurse 24-7, just call the main number to the clinic [phone] and do not press any options, hold on the line and a nurse will answer the phone.    For prescription refill requests, have your pharmacy contact our office and allow 72 hours.    Due to Covid, you will need to wear a mask upon entering the hospital. If you do not have a mask, a mask will be given to you at the Main Entrance upon arrival. For doctor visits, patients may have 1 support person age 45 or older with them. For treatment visits, patients can not have anyone with them due to social distancing guidelines and our immunocompromised population.

## 2021-06-05 LAB — PATHOLOGIST SMEAR REVIEW

## 2021-06-15 ENCOUNTER — Encounter (HOSPITAL_COMMUNITY): Payer: Self-pay | Admitting: Hematology

## 2021-06-16 ENCOUNTER — Ambulatory Visit: Payer: Medicare HMO | Admitting: Family Medicine

## 2021-06-17 NOTE — Progress Notes (Signed)
[facility] [address] [place], [place] [address]   CLINIC:  Medical Oncology/Hematology  PCP:  Gwenlyn Fudge, FNP [address] [place] / [place] [employer] [place] [address] [phone]   REASON FOR VISIT:  Follow-up for left breast cancer  PRIOR THERAPY: none  CURRENT THERAPY: Taxotere and Cytoxan ever 3 weeks  BRIEF ONCOLOGIC HISTORY:  Oncology History  Breast cancer, left (HCC)  04/22/2021 Initial Diagnosis   Breast cancer, left (HCC)   04/22/2021 Cancer Staging   Staging form: Breast, AJCC 8th Edition - Clinical stage from 04/22/2021: Stage IA (cT1c, cN0, cM0, G2, ER+, PR-, HER2-) - Signed by Doreatha Massed, MD on 04/22/2021 Stage prefix: Initial diagnosis Nuclear grade: G2 Histologic grading system: 3 grade system HER2-IHC interpretation: Negative HER2-IHC value: Score 1+ Ki-67 (%): 25    05/28/2021 -  Chemotherapy   Patient is on Treatment Plan : BREAST TC q21d       CANCER STAGING:  Cancer Staging  Breast cancer, left (HCC) Staging form: Breast, AJCC 8th Edition - Clinical stage from 04/22/2021: Stage IA (cT1c, cN0, cM0, G2, ER+, PR-, HER2-) - Signed by Doreatha Massed, MD on 04/22/2021   INTERVAL HISTORY:  [name], a 65 y.o. adult, returns for routine follow-up and consideration for next cycle of chemotherapy. Kymberlee was last seen on 05/28/2021.  Due for cycle #2 of Taxotere and Cytoxan today.   Overall, she tells me she has been feeling pretty well. She denies tingling/numbness. She reports one episode of vomiting. She reports aching pain in her legs for 2 weeks following her last treatment which disrupted her sleep; tylenol did not help. She is not currently taking calcium and vitamin D.  She denies tingling/numbness. Her weight is stable and her eating has improved. She denies ankle swellings. She reports hair loss.   Overall, she feels ready for next cycle of chemo today.    REVIEW OF SYSTEMS:  Review of Systems  Constitutional:   Positive for fatigue (depleted). Negative for appetite change (50%) and unexpected weight change.  Cardiovascular:  Negative for leg swelling.  Gastrointestinal:  Vomiting: x1.  Musculoskeletal:  Positive for arthralgias (legs).  Neurological:  Positive for headaches. Negative for numbness.  Psychiatric/Behavioral:  Positive for sleep disturbance.   All other systems reviewed and are negative.  PAST MEDICAL/SURGICAL HISTORY:  Past Medical History:  Diagnosis Date   Allergy    Anxiety    Breast cancer (HCC)    Depression    GERD (gastroesophageal reflux disease)    Hyperlipidemia    Hypertension    Osteopenia 04/30/2021   Port-A-Cath in place 05/07/2021   Vitamin D insufficiency    Past Surgical History:  Procedure Laterality Date   EXCISION OF SKIN TAG Right 05/27/2021   Procedure: EXCISION OF RIGHT SKIN TAG;  Surgeon: Lucretia Roers, MD;  Location: [facility];  Service: General;  Laterality: Right;   MASTECTOMY W/ SENTINEL NODE BIOPSY Left 03/23/2021   Procedure: TOTAL MASTECTOMY WITH SENTINEL LYMPH NODE BIOPSY;  Surgeon: Lucretia Roers, MD;  Location: [facility];  Service: General;  Laterality: Left;   NECK SURGERY     OOPHORECTOMY     PORTACATH PLACEMENT Right 05/27/2021   Procedure: INSERTION PORT-A-CATH;  Surgeon: Lucretia Roers, MD;  Location: [facility];  Service: General;  Laterality: Right;   SALPINGECTOMY      SOCIAL HISTORY:  Social History   Socioeconomic History   Marital status: Divorced    Spouse name: Not on file  Number of children: 0   Years of education: Not on file   Highest education level: Not on file  Occupational History   Occupation: retired  Tobacco Use   Smoking status: Former    Packs/day: 0.25    Types: Cigarettes   Smokeless tobacco: Never   Tobacco comments:    1 pack every 3-4 days  Vaping Use   Vaping Use: Never used  Substance and Sexual Activity   Alcohol use: Yes    Alcohol/week: 20.0 standard drinks    Types: 20 Cans of  beer per week    Comment: 3-4 beers per day   Drug use: Never   Sexual activity: Not Currently    Birth control/protection: Post-menopausal  Other Topics Concern   Not on file  Social History Narrative   Sister lives about 15 minutes away   Social Determinants of Health   Financial Resource Strain: Low Risk    Difficulty of Paying Living Expenses: Not hard at all  Food Insecurity: No Food Insecurity   Worried About Charity fundraiser in the Last Year: Never true   Arboriculturist in the Last Year: Never true  Transportation Needs: No Transportation Needs   Lack of Transportation (Medical): No   Lack of Transportation (Non-Medical): No  Physical Activity: Insufficiently Active   Days of Exercise per Week: 5 days   Minutes of Exercise per Session: 20 min  Stress: Stress Concern Present   Feeling of Stress : To some extent  Social Connections: Socially Isolated   Frequency of Communication with Friends and Family: More than three times a week   Frequency of Social Gatherings with Friends and Family: More than three times a week   Attends Religious Services: Never   Marine scientist or Organizations: No   Attends Music therapist: Never   Marital Status: Divorced  Human resources officer Violence: Not At Risk   Fear of Current or Ex-Partner: No   Emotionally Abused: No   Physically Abused: No   Sexually Abused: No    FAMILY HISTORY:  Family History  Problem Relation Age of Onset   Stroke Mother    Alcohol abuse Mother    Diabetes Father    Pancreatic cancer Father    Testicular cancer Father    Pancreatic disease Father    Breast cancer Maternal Aunt    Breast cancer Paternal Aunt    Breast cancer Maternal Grandmother    Colon cancer Paternal Grandfather     CURRENT MEDICATIONS:  Current Outpatient Medications  Medication Sig Dispense Refill   acetaminophen (TYLENOL) 325 MG tablet Take 325 mg by mouth every 6 (six) hours as needed for moderate pain or  headache.     atorvastatin (LIPITOR) 20 MG tablet Take 1 tablet (20 mg total) by mouth daily. 30 tablet 2   CYCLOPHOSPHAMIDE IV Inject into the vein every 21 ( twenty-one) days.     DOCEtaxel (TAXOTERE IV) Inject into the vein every 21 ( twenty-one) days.     FLUoxetine (PROZAC) 20 MG capsule Take 1 capsule (20 mg total) by mouth daily. 30 capsule 2   lisinopril (ZESTRIL) 10 MG tablet Take 1 tablet (10 mg total) by mouth daily with lunch. 30 tablet 2   omeprazole (PRILOSEC) 20 MG capsule Take 1 capsule (20 mg total) by mouth daily as needed (indigestion/heartburn). (Patient taking differently: Take 20 mg by mouth daily.) 30 capsule 2   vitamin B-12 (CYANOCOBALAMIN) 500 MCG tablet Take 500  mcg by mouth daily.     lidocaine-prilocaine (EMLA) cream Apply a small amount to port a cath site (do not rub in) and cover with plastic wrap 1 hour prior to chemotherapy appointments (Patient not taking: Reported on 06/18/2021) 30 g 3   oxyCODONE (ROXICODONE) 5 MG immediate release tablet Take 1 tablet (5 mg total) by mouth every 4 (four) hours as needed for breakthrough pain or severe pain. (Patient not taking: Reported on 06/18/2021) 10 tablet 0   prochlorperazine (COMPAZINE) 10 MG tablet Take 1 tablet (10 mg total) by mouth every 6 (six) hours as needed (Nausea or vomiting). (Patient not taking: Reported on 06/18/2021) 30 tablet 1   traMADol (ULTRAM) 50 MG tablet Take 1 tablet (50 mg total) by mouth every 6 (six) hours as needed for severe pain. (Patient not taking: Reported on 06/18/2021) 30 tablet 0   No current facility-administered medications for this visit.    ALLERGIES:  Allergies  Allergen Reactions   Ibuprofen Other (See Comments)    "Stomach upset"   Penicillins     Allergic as a child. (Rash ???) Does not recall what type of reaction she has to drug.    PHYSICAL EXAM:  Performance status (ECOG): 1 - Symptomatic but completely ambulatory  There were no vitals filed for this visit. Wt  Readings from Last 3 Encounters:  06/18/21 126 lb 1.7 oz (57.2 kg)  06/04/21 126 lb 1.7 oz (57.2 kg)  06/02/21 124 lb (56.2 kg)   Physical Exam Vitals reviewed.  Constitutional:      Appearance: Normal appearance.  Cardiovascular:     Rate and Rhythm: Normal rate and regular rhythm.     Pulses: Normal pulses.     Heart sounds: Normal heart sounds.  Pulmonary:     Effort: Pulmonary effort is normal.     Breath sounds: Normal breath sounds.  Neurological:     General: No focal deficit present.     Mental Status: She is alert and oriented to person, place, and time.  Psychiatric:        Mood and Affect: Mood normal.        Behavior: Behavior normal.    LABORATORY DATA:  I have reviewed the labs as listed.  CBC Latest Ref Rng & Units 06/18/2021 06/04/2021 05/28/2021  WBC 4.0 - 10.5 K/uL 9.0 16.7(H) 6.5  Hemoglobin 12.0 - 15.0 g/dL 10.7(L) 10.8(L) 11.3(L)  Hematocrit 36.0 - 46.0 % 31.5(L) 31.5(L) 33.7(L)  Platelets 150 - 400 K/uL 334 289 321   CMP Latest Ref Rng & Units 06/18/2021 06/04/2021 05/28/2021  Glucose 70 - 99 mg/dL 80 114(H) 80  BUN 8 - 23 mg/dL 12 5(L) 5(L)  Creatinine 0.44 - 1.00 mg/dL 0.62 0.71 0.57  Sodium 135 - 145 mmol/L 133(L) 127(L) 131(L)  Potassium 3.5 - 5.1 mmol/L 4.1 4.0 4.0  Chloride 98 - 111 mmol/L 100 93(L) 100  CO2 22 - 32 mmol/L $RemoveB'26 24 24  'DTYRfqCI$ Calcium 8.9 - 10.3 mg/dL 9.0 9.1 9.2  Total Protein 6.5 - 8.1 g/dL 6.8 6.7 6.9  Total Bilirubin 0.3 - 1.2 mg/dL 0.2(L) 0.4 0.7  Alkaline Phos 38 - 126 U/L 59 71 45  AST 15 - 41 U/L $Remo'23 27 25  'JmZTT$ ALT 0 - 44 U/L $Remo'13 13 13    'vxApw$ DIAGNOSTIC IMAGING:  I have independently reviewed the scans and discussed with the patient. DG Chest Port 1 View  Result Date: 05/27/2021 CLINICAL DATA:  Port placement, history of breast cancer EXAM: PORTABLE CHEST 1  VIEW COMPARISON:  None. FINDINGS: There is a right chest wall port in place with the tip in the mid SVC. The cardiomediastinal silhouette is normal. There is no focal consolidation  or pulmonary edema. There is no pleural effusion or pneumothorax. There is a remote fracture of the left eighth rib. Cervical spine fusion hardware is noted. IMPRESSION: Right chest wall port in place with the tip in the mid SVC. No pneumothorax. Electronically Signed   By: Valetta Mole M.D.   On: 05/27/2021 08:48   DG C-Arm 1-60 Min-No Report  Result Date: 05/27/2021 Fluoroscopy was utilized by the requesting physician.  No radiographic interpretation.     ASSESSMENT:  Stage I (T1CN0) left breast IDC, ER positive, HER2/PR negative: - Abnormal mammogram followed by left breast subareolar mass biopsy on 02/24/2021 consistent with DCIS intermediate to high-grade - Left mastectomy and SLNB on 03/23/2021 - Pathology shows 1.5 cm IDC, grade 2, extensive DCIS, margins negative, 0/2 left axillary lymph nodes, PT1CN0.  ER 100%.  PR-negative.  HER2 1+.  Ki-67-25%. - Oncotype DX showed recurrence score 33.  There is more than 15% chemotherapy benefit.  Distant recurrence at 9 years with tamoxifen alone was 21%. - Cycle 1 of dose attenuated TC on 05/28/2021.   Social/family history: - She lives by herself.  She is seen with her sister today. 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 - She quit smoking 1 month ago.  Half pack per day for 35 years. - She drinks alcohol 3-5 beers daily. - Father had prostate and pancreatic cancer.  Maternal grandmother and maternal aunt had breast cancer.  Paternal grandfather had colon cancer.  Other people in her family also had cancers, type unknown to the patient.   PLAN:  Stage I (T1CN0) left breast IDC, ER positive, HER2/PR negative, Oncotype DX 33: - She has tolerated cycle 1 of dose attenuated TC reasonably well.  She has developed leg pains after Neulasta injection. - She did not have any major GI side effects.  No tingling or numbness in the extremities noted. - Reviewed labs today which showed normal LFTs.  CBC was grossly normal with  mild normocytic anemia, chemotherapy-induced. - Recommend proceeding with cycle 2 today with 20% dose reduction. - RTC 3 weeks for follow-up.   2.  Bone health (osteopenia): - DEXA scan on 04/30/2021 with T score -1.5. - Continue calcium and vitamin D supplements.  3.  Leg pains: - He developed leg pains after Neulasta.  Tramadol did not help. - Continue Tylenol as needed as it is helping.   Orders placed this encounter:  No orders of the defined types were placed in this encounter.    Derek Jack, MD [facility] [phone]   I, Thana Ates, am acting as a scribe for Dr. Derek Jack.  I, Derek Jack MD, have reviewed the above documentation for accuracy and completeness, and I agree with the above.

## 2021-06-18 ENCOUNTER — Inpatient Hospital Stay (HOSPITAL_BASED_OUTPATIENT_CLINIC_OR_DEPARTMENT_OTHER): Payer: Medicare HMO | Admitting: Hematology

## 2021-06-18 ENCOUNTER — Inpatient Hospital Stay (HOSPITAL_COMMUNITY): Payer: Medicare HMO

## 2021-06-18 ENCOUNTER — Other Ambulatory Visit: Payer: Self-pay

## 2021-06-18 ENCOUNTER — Other Ambulatory Visit (HOSPITAL_COMMUNITY): Payer: Medicare HMO

## 2021-06-18 VITALS — BP 114/68 | HR 63 | Temp 97.2°F | Resp 18

## 2021-06-18 DIAGNOSIS — M2550 Pain in unspecified joint: Secondary | ICD-10-CM

## 2021-06-18 DIAGNOSIS — C50912 Malignant neoplasm of unspecified site of left female breast: Secondary | ICD-10-CM

## 2021-06-18 DIAGNOSIS — D6481 Anemia due to antineoplastic chemotherapy: Secondary | ICD-10-CM | POA: Diagnosis not present

## 2021-06-18 DIAGNOSIS — Z170 Estrogen receptor positive status [ER+]: Secondary | ICD-10-CM | POA: Diagnosis not present

## 2021-06-18 DIAGNOSIS — G8918 Other acute postprocedural pain: Secondary | ICD-10-CM

## 2021-06-18 DIAGNOSIS — E871 Hypo-osmolality and hyponatremia: Secondary | ICD-10-CM | POA: Diagnosis not present

## 2021-06-18 DIAGNOSIS — C50012 Malignant neoplasm of nipple and areola, left female breast: Secondary | ICD-10-CM | POA: Diagnosis not present

## 2021-06-18 DIAGNOSIS — M8580 Other specified disorders of bone density and structure, unspecified site: Secondary | ICD-10-CM | POA: Diagnosis not present

## 2021-06-18 DIAGNOSIS — Z5111 Encounter for antineoplastic chemotherapy: Secondary | ICD-10-CM | POA: Diagnosis not present

## 2021-06-18 DIAGNOSIS — Z5189 Encounter for other specified aftercare: Secondary | ICD-10-CM | POA: Diagnosis not present

## 2021-06-18 DIAGNOSIS — T451X5A Adverse effect of antineoplastic and immunosuppressive drugs, initial encounter: Secondary | ICD-10-CM | POA: Diagnosis not present

## 2021-06-18 DIAGNOSIS — Z87891 Personal history of nicotine dependence: Secondary | ICD-10-CM | POA: Diagnosis not present

## 2021-06-18 DIAGNOSIS — Z95828 Presence of other vascular implants and grafts: Secondary | ICD-10-CM

## 2021-06-18 DIAGNOSIS — Z79899 Other long term (current) drug therapy: Secondary | ICD-10-CM | POA: Diagnosis not present

## 2021-06-18 LAB — CBC WITH DIFFERENTIAL/PLATELET
Abs Immature Granulocytes: 0.07 10*3/uL (ref 0.00–0.07)
Basophils Absolute: 0.1 10*3/uL (ref 0.0–0.1)
Basophils Relative: 1 %
Eosinophils Absolute: 0.1 10*3/uL (ref 0.0–0.5)
Eosinophils Relative: 1 %
HCT: 31.5 % — ABNORMAL LOW (ref 36.0–46.0)
Hemoglobin: 10.7 g/dL — ABNORMAL LOW (ref 12.0–15.0)
Immature Granulocytes: 1 %
Lymphocytes Relative: 19 %
Lymphs Abs: 1.7 10*3/uL (ref 0.7–4.0)
MCH: 33.6 pg (ref 26.0–34.0)
MCHC: 34 g/dL (ref 30.0–36.0)
MCV: 99.1 fL (ref 80.0–100.0)
Monocytes Absolute: 0.7 10*3/uL (ref 0.1–1.0)
Monocytes Relative: 8 %
Neutro Abs: 6.4 10*3/uL (ref 1.7–7.7)
Neutrophils Relative %: 70 %
Platelets: 334 10*3/uL (ref 150–400)
RBC: 3.18 MIL/uL — ABNORMAL LOW (ref 3.87–5.11)
RDW: 13.4 % (ref 11.5–15.5)
WBC: 9 10*3/uL (ref 4.0–10.5)
nRBC: 0 % (ref 0.0–0.2)

## 2021-06-18 LAB — COMPREHENSIVE METABOLIC PANEL
ALT: 13 U/L (ref 0–44)
AST: 23 U/L (ref 15–41)
Albumin: 4.3 g/dL (ref 3.5–5.0)
Alkaline Phosphatase: 59 U/L (ref 38–126)
Anion gap: 7 (ref 5–15)
BUN: 12 mg/dL (ref 8–23)
CO2: 26 mmol/L (ref 22–32)
Calcium: 9 mg/dL (ref 8.9–10.3)
Chloride: 100 mmol/L (ref 98–111)
Creatinine, Ser: 0.62 mg/dL (ref 0.44–1.00)
GFR, Estimated: >60 mL/min (ref >60)
Glucose, Bld: 80 mg/dL (ref 70–99)
Potassium: 4.1 mmol/L (ref 3.5–5.1)
Sodium: 133 mmol/L — ABNORMAL LOW (ref 135–145)
Total Bilirubin: 0.2 mg/dL — ABNORMAL LOW (ref 0.3–1.2)
Total Protein: 6.8 g/dL (ref 6.5–8.1)

## 2021-06-18 LAB — MAGNESIUM: Magnesium: 2 mg/dL (ref 1.7–2.4)

## 2021-06-18 MED ORDER — SODIUM CHLORIDE 0.9 % IV SOLN
60.0000 mg/m2 | Freq: Once | INTRAVENOUS | Status: AC
  Administered 2021-06-18: 13:00:00 100 mg via INTRAVENOUS
  Filled 2021-06-18: qty 10

## 2021-06-18 MED ORDER — HEPARIN SOD (PORK) LOCK FLUSH 100 UNIT/ML IV SOLN
500.0000 [IU] | Freq: Once | INTRAVENOUS | Status: AC | PRN
  Administered 2021-06-18: 15:00:00 500 [IU]

## 2021-06-18 MED ORDER — PALONOSETRON HCL INJECTION 0.25 MG/5ML
0.2500 mg | Freq: Once | INTRAVENOUS | Status: AC
  Administered 2021-06-18: 11:00:00 0.25 mg via INTRAVENOUS
  Filled 2021-06-18: qty 5

## 2021-06-18 MED ORDER — SODIUM CHLORIDE 0.9 % IV SOLN
10.0000 mg | Freq: Once | INTRAVENOUS | Status: AC
  Administered 2021-06-18: 12:00:00 10 mg via INTRAVENOUS
  Filled 2021-06-18: qty 10

## 2021-06-18 MED ORDER — SODIUM CHLORIDE 0.9% FLUSH
10.0000 mL | INTRAVENOUS | Status: DC | PRN
  Administered 2021-06-18: 15:00:00 10 mL

## 2021-06-18 MED ORDER — SODIUM CHLORIDE 0.9 % IV SOLN: Freq: Once | INTRAVENOUS | Status: AC

## 2021-06-18 MED ORDER — SODIUM CHLORIDE 0.9 % IV SOLN
480.0000 mg/m2 | Freq: Once | INTRAVENOUS | Status: AC
  Administered 2021-06-18: 14:00:00 780 mg via INTRAVENOUS
  Filled 2021-06-18: qty 39

## 2021-06-18 NOTE — Patient Instructions (Signed)
[facility] at [facility] Discharge Instructions   You were seen and examined today by Dr. Delton Coombes. He reviewed your lab work which was normal/stable. We will proceed with your treatment today. Return as scheduled in 3 weeks.   Thank you for choosing [place] at [facility] to provide your oncology and hematology care.  To afford each patient quality time with our provider, please arrive at least 15 minutes before your scheduled appointment time.   If you have a lab appointment with the [facility] please come in thru the Main Entrance and check in at the main information desk.  You need to re-schedule your appointment should you arrive 10 or more minutes late.  We strive to give you quality time with our providers, and arriving late affects you and other patients whose appointments are after yours.  Also, if you no show three or more times for appointments you may be dismissed from the clinic at the providers discretion.     Again, thank you for choosing [facility].  Our hope is that these requests will decrease the amount of time that you wait before being seen by our physicians.       _____________________________________________________________  Should you have questions after your visit to [facility], please contact our office at [phone] and follow the prompts.  Our office hours are 8:00 a.m. and 4:30 p.m. Monday - Friday.  Please note that voicemails left after 4:00 p.m. may not be returned until the following business day.  We are closed weekends and major holidays.  You do have access to a nurse 24-7, just call the main number to the clinic [phone] and do not press any options, hold on the line and a nurse will answer the phone.    For prescription refill requests, have your pharmacy contact our office and allow 72 hours.    Due to Covid, you will need to wear a mask upon entering the hospital.  If you do not have a mask, a mask will be given to you at the Main Entrance upon arrival. For doctor visits, patients may have 1 support person age 37 or older with them. For treatment visits, patients can not have anyone with them due to social distancing guidelines and our immunocompromised population.

## 2021-06-18 NOTE — Progress Notes (Signed)
Treatment given today per MD orders. Tolerated infusion without adverse affects. Vital signs stable. No complaints at this time. Discharged from clinic ambulatory in stable condition. Alert and oriented x 3. F/U with [facility] as scheduled.   

## 2021-06-18 NOTE — Progress Notes (Signed)
Patient has been examined by Dr. Katragadda, and vital signs and labs have been reviewed. ANC, Creatinine, LFTs, hemoglobin, and platelets are within treatment parameters per M.D. - pt may proceed with treatment.    °

## 2021-06-18 NOTE — Patient Instructions (Signed)
[facility]  Discharge Instructions: Thank you for choosing [facility] to provide your oncology and hematology care.  If you have a lab appointment with the [facility], please come in thru the Main Entrance and check in at the main information desk.  Wear comfortable clothing and clothing appropriate for easy access to any Portacath or PICC line.   We strive to give you quality time with your provider. You may need to reschedule your appointment if you arrive late (15 or more minutes).  Arriving late affects you and other patients whose appointments are after yours.  Also, if you miss three or more appointments without notifying the office, you may be dismissed from the clinic at the providers discretion.      For prescription refill requests, have your pharmacy contact our office and allow 72 hours for refills to be completed.    Today you received the following chemotherapy : Taxotere/Cytoxan.       To help prevent nausea and vomiting after your treatment, we encourage you to take your nausea medication as directed.  BELOW ARE SYMPTOMS THAT SHOULD BE REPORTED IMMEDIATELY: *FEVER GREATER THAN 100.4 F (38 C) OR HIGHER *CHILLS OR SWEATING *NAUSEA AND VOMITING THAT IS NOT CONTROLLED WITH YOUR NAUSEA MEDICATION *UNUSUAL SHORTNESS OF BREATH *UNUSUAL BRUISING OR BLEEDING *URINARY PROBLEMS (pain or burning when urinating, or frequent urination) *BOWEL PROBLEMS (unusual diarrhea, constipation, pain near the anus) TENDERNESS IN MOUTH AND THROAT WITH OR WITHOUT PRESENCE OF ULCERS (sore throat, sores in mouth, or a toothache) UNUSUAL RASH, SWELLING OR PAIN  UNUSUAL VAGINAL DISCHARGE OR ITCHING   Items with * indicate a potential emergency and should be followed up as soon as possible or go to the Emergency Department if any problems should occur.  Please show the CHEMOTHERAPY ALERT CARD or IMMUNOTHERAPY ALERT CARD at check-in to the Emergency Department and triage  nurse.  Should you have questions after your visit or need to cancel or reschedule your appointment, please contact [facility] [phone]  and follow the prompts.  Office hours are 8:00 a.m. to 4:30 p.m. Monday - Friday. Please note that voicemails left after 4:00 p.m. may not be returned until the following business day.  We are closed weekends and major holidays. You have access to a nurse at all times for urgent questions. Please call the main number to the clinic [phone] and follow the prompts.  For any non-urgent questions, you may also contact your provider using MyChart. We now offer e-Visits for anyone 20 and older to request care online for non-urgent symptoms. For details visit [email or web address].   Also download the MyChart app! Go to the app store, search "MyChart", open the app, select [facility], and log in with your MyChart username and password.  Due to Covid, a mask is required upon entering the hospital/clinic. If you do not have a mask, one will be given to you upon arrival. For doctor visits, patients may have 1 support person aged 44 or older with them. For treatment visits, patients cannot have anyone with them due to current Covid guidelines and our immunocompromised population.

## 2021-06-19 ENCOUNTER — Other Ambulatory Visit (HOSPITAL_COMMUNITY): Payer: Self-pay

## 2021-06-19 ENCOUNTER — Encounter (HOSPITAL_COMMUNITY): Payer: Self-pay

## 2021-06-19 ENCOUNTER — Inpatient Hospital Stay (HOSPITAL_COMMUNITY): Payer: Medicare HMO

## 2021-06-19 VITALS — BP 103/53 | HR 74 | Temp 98.7°F | Resp 18

## 2021-06-19 DIAGNOSIS — Z5111 Encounter for antineoplastic chemotherapy: Secondary | ICD-10-CM | POA: Diagnosis not present

## 2021-06-19 DIAGNOSIS — T451X5A Adverse effect of antineoplastic and immunosuppressive drugs, initial encounter: Secondary | ICD-10-CM | POA: Diagnosis not present

## 2021-06-19 DIAGNOSIS — E871 Hypo-osmolality and hyponatremia: Secondary | ICD-10-CM | POA: Diagnosis not present

## 2021-06-19 DIAGNOSIS — Z170 Estrogen receptor positive status [ER+]: Secondary | ICD-10-CM | POA: Diagnosis not present

## 2021-06-19 DIAGNOSIS — Z5189 Encounter for other specified aftercare: Secondary | ICD-10-CM | POA: Diagnosis not present

## 2021-06-19 DIAGNOSIS — D6481 Anemia due to antineoplastic chemotherapy: Secondary | ICD-10-CM | POA: Diagnosis not present

## 2021-06-19 DIAGNOSIS — C50012 Malignant neoplasm of nipple and areola, left female breast: Secondary | ICD-10-CM | POA: Diagnosis not present

## 2021-06-19 DIAGNOSIS — C50912 Malignant neoplasm of unspecified site of left female breast: Secondary | ICD-10-CM

## 2021-06-19 DIAGNOSIS — Z79899 Other long term (current) drug therapy: Secondary | ICD-10-CM | POA: Diagnosis not present

## 2021-06-19 DIAGNOSIS — Z87891 Personal history of nicotine dependence: Secondary | ICD-10-CM | POA: Diagnosis not present

## 2021-06-19 DIAGNOSIS — Z95828 Presence of other vascular implants and grafts: Secondary | ICD-10-CM

## 2021-06-19 DIAGNOSIS — M8580 Other specified disorders of bone density and structure, unspecified site: Secondary | ICD-10-CM | POA: Diagnosis not present

## 2021-06-19 MED ORDER — PEGFILGRASTIM-CBQV 6 MG/0.6ML ~~LOC~~ SOSY
6.0000 mg | PREFILLED_SYRINGE | Freq: Once | SUBCUTANEOUS | Status: AC
  Administered 2021-06-19: 13:00:00 6 mg via SUBCUTANEOUS
  Filled 2021-06-19: qty 0.6

## 2021-06-19 MED ORDER — OXYCODONE HCL 5 MG PO TABS: 5.0000 mg | ORAL_TABLET | Freq: Three times a day (TID) | ORAL | 0 refills | Status: DC | PRN

## 2021-06-19 NOTE — Progress Notes (Signed)
Patient tolerated injection with no complaints voiced.  Site clean and dry with no bruising or swelling noted at site.  See MAR for details.  Band aid applied.  Patient stable during and after injection.  Vss with discharge and left in satisfactory condition with no s/s of distress noted.  

## 2021-06-19 NOTE — Patient Instructions (Signed)
[facility]  Discharge Instructions: Thank you for choosing [facility] to provide your oncology and hematology care.  If you have a lab appointment with the Cancer Center, please come in thru the Main Entrance and check in at the main information desk.  Wear comfortable clothing and clothing appropriate for easy access to any Portacath or PICC line.   We strive to give you quality time with your provider. You may need to reschedule your appointment if you arrive late (15 or more minutes).  Arriving late affects you and other patients whose appointments are after yours.  Also, if you miss three or more appointments without notifying the office, you may be dismissed from the clinic at the provider's discretion.      For prescription refill requests, have your pharmacy contact our office and allow 72 hours for refills to be completed.        To help prevent nausea and vomiting after your treatment, we encourage you to take your nausea medication as directed.  BELOW ARE SYMPTOMS THAT SHOULD BE REPORTED IMMEDIATELY: *FEVER GREATER THAN 100.4 F (38 C) OR HIGHER *CHILLS OR SWEATING *NAUSEA AND VOMITING THAT IS NOT CONTROLLED WITH YOUR NAUSEA MEDICATION *UNUSUAL SHORTNESS OF BREATH *UNUSUAL BRUISING OR BLEEDING *URINARY PROBLEMS (pain or burning when urinating, or frequent urination) *BOWEL PROBLEMS (unusual diarrhea, constipation, pain near the anus) TENDERNESS IN MOUTH AND THROAT WITH OR WITHOUT PRESENCE OF ULCERS (sore throat, sores in mouth, or a toothache) UNUSUAL RASH, SWELLING OR PAIN  UNUSUAL VAGINAL DISCHARGE OR ITCHING   Items with * indicate a potential emergency and should be followed up as soon as possible or go to the Emergency Department if any problems should occur.  Please show the CHEMOTHERAPY ALERT CARD or IMMUNOTHERAPY ALERT CARD at check-in to the Emergency Department and triage nurse.  Should you have questions after your visit or need to cancel  or reschedule your appointment, please contact [facility] [phone]  and follow the prompts.  Office hours are 8:00 a.m. to 4:30 p.m. Monday - Friday. Please note that voicemails left after 4:00 p.m. may not be returned until the following business day.  We are closed weekends and major holidays. You have access to a nurse at all times for urgent questions. Please call the main number to the clinic [phone] and follow the prompts.  For any non-urgent questions, you may also contact your provider using MyChart. We now offer e-Visits for anyone 18 and older to request care online for non-urgent symptoms. For details visit [email or web address].   Also download the MyChart app! Go to the app store, search "MyChart", open the app, select [facility], and log in with your MyChart username and password.  Due to Covid, a mask is required upon entering the hospital/clinic. If you do not have a mask, one will be given to you upon arrival. For doctor visits, patients may have 1 support person aged 18 or older with them. For treatment visits, patients cannot have anyone with them due to current Covid guidelines and our immunocompromised population.  

## 2021-06-29 ENCOUNTER — Other Ambulatory Visit (HOSPITAL_COMMUNITY): Payer: Self-pay | Admitting: Hematology

## 2021-06-29 DIAGNOSIS — C50912 Malignant neoplasm of unspecified site of left female breast: Secondary | ICD-10-CM

## 2021-07-01 ENCOUNTER — Encounter (HOSPITAL_COMMUNITY): Payer: Self-pay | Admitting: Hematology

## 2021-07-02 ENCOUNTER — Inpatient Hospital Stay (HOSPITAL_COMMUNITY): Payer: Medicare HMO | Attending: Hematology | Admitting: Licensed Clinical Social Worker

## 2021-07-02 ENCOUNTER — Encounter (HOSPITAL_COMMUNITY): Payer: Self-pay | Admitting: Licensed Clinical Social Worker

## 2021-07-02 ENCOUNTER — Telehealth: Payer: Self-pay | Admitting: Licensed Clinical Social Worker

## 2021-07-02 ENCOUNTER — Inpatient Hospital Stay (HOSPITAL_COMMUNITY): Payer: Medicare HMO

## 2021-07-02 ENCOUNTER — Other Ambulatory Visit: Payer: Self-pay

## 2021-07-02 ENCOUNTER — Other Ambulatory Visit (HOSPITAL_COMMUNITY)
Admission: RE | Admit: 2021-07-02 | Discharge: 2021-07-02 | Disposition: A | Discharge status: Home / Self Care | Payer: Medicare HMO | Source: Ambulatory Visit | Attending: Hematology | Admitting: Hematology

## 2021-07-02 DIAGNOSIS — Z5111 Encounter for antineoplastic chemotherapy: Secondary | ICD-10-CM | POA: Insufficient documentation

## 2021-07-02 DIAGNOSIS — M8580 Other specified disorders of bone density and structure, unspecified site: Secondary | ICD-10-CM | POA: Insufficient documentation

## 2021-07-02 DIAGNOSIS — Z87891 Personal history of nicotine dependence: Secondary | ICD-10-CM | POA: Insufficient documentation

## 2021-07-02 DIAGNOSIS — Z803 Family history of malignant neoplasm of breast: Secondary | ICD-10-CM

## 2021-07-02 DIAGNOSIS — Z1379 Encounter for other screening for genetic and chromosomal anomalies: Secondary | ICD-10-CM | POA: Diagnosis not present

## 2021-07-02 DIAGNOSIS — Z8042 Family history of malignant neoplasm of prostate: Secondary | ICD-10-CM | POA: Insufficient documentation

## 2021-07-02 DIAGNOSIS — Z95828 Presence of other vascular implants and grafts: Secondary | ICD-10-CM | POA: Diagnosis not present

## 2021-07-02 DIAGNOSIS — Z170 Estrogen receptor positive status [ER+]: Secondary | ICD-10-CM | POA: Diagnosis not present

## 2021-07-02 DIAGNOSIS — C50912 Malignant neoplasm of unspecified site of left female breast: Secondary | ICD-10-CM | POA: Diagnosis not present

## 2021-07-02 DIAGNOSIS — Z808 Family history of malignant neoplasm of other organs or systems: Secondary | ICD-10-CM | POA: Diagnosis not present

## 2021-07-02 DIAGNOSIS — Z800 Family history of malignant neoplasm of digestive organs: Secondary | ICD-10-CM | POA: Diagnosis not present

## 2021-07-02 DIAGNOSIS — Z9012 Acquired absence of left breast and nipple: Secondary | ICD-10-CM | POA: Insufficient documentation

## 2021-07-02 DIAGNOSIS — Z5189 Encounter for other specified aftercare: Secondary | ICD-10-CM | POA: Insufficient documentation

## 2021-07-02 DIAGNOSIS — Z79899 Other long term (current) drug therapy: Secondary | ICD-10-CM | POA: Insufficient documentation

## 2021-07-02 LAB — COMPREHENSIVE METABOLIC PANEL
ALT: 13 U/L (ref 0–44)
AST: 23 U/L (ref 15–41)
Albumin: 4.1 g/dL (ref 3.5–5.0)
Alkaline Phosphatase: 98 U/L (ref 38–126)
Anion gap: 6 (ref 5–15)
BUN: 7 mg/dL — ABNORMAL LOW (ref 8–23)
CO2: 27 mmol/L (ref 22–32)
Calcium: 8.7 mg/dL — ABNORMAL LOW (ref 8.9–10.3)
Chloride: 98 mmol/L (ref 98–111)
Creatinine, Ser: 0.62 mg/dL (ref 0.44–1.00)
GFR, Estimated: >60 mL/min (ref >60)
Glucose, Bld: 82 mg/dL (ref 70–99)
Potassium: 4.1 mmol/L (ref 3.5–5.1)
Sodium: 131 mmol/L — ABNORMAL LOW (ref 135–145)
Total Bilirubin: 0.2 mg/dL — ABNORMAL LOW (ref 0.3–1.2)
Total Protein: 7.1 g/dL (ref 6.5–8.1)

## 2021-07-02 LAB — CBC WITH DIFFERENTIAL/PLATELET
Band Neutrophils: 9 %
Basophils Absolute: 0 10*3/uL (ref 0.0–0.1)
Basophils Relative: 0 %
Eosinophils Absolute: 0 10*3/uL (ref 0.0–0.5)
Eosinophils Relative: 0 %
HCT: 30.7 % — ABNORMAL LOW (ref 36.0–46.0)
Hemoglobin: 10.1 g/dL — ABNORMAL LOW (ref 12.0–15.0)
Lymphocytes Relative: 7 %
Lymphs Abs: 1.9 10*3/uL (ref 0.7–4.0)
MCH: 32.8 pg (ref 26.0–34.0)
MCHC: 32.9 g/dL (ref 30.0–36.0)
MCV: 99.7 fL (ref 80.0–100.0)
Metamyelocytes Relative: 1 %
Monocytes Absolute: 1.1 10*3/uL — ABNORMAL HIGH (ref 0.1–1.0)
Monocytes Relative: 4 %
Myelocytes: 1 %
Neutro Abs: 23.2 10*3/uL — ABNORMAL HIGH (ref 1.7–7.7)
Neutrophils Relative %: 75 %
Platelets: 289 10*3/uL (ref 150–400)
Promyelocytes Relative: 3 %
RBC: 3.08 MIL/uL — ABNORMAL LOW (ref 3.87–5.11)
RDW: 14.1 % (ref 11.5–15.5)
WBC: 27.6 10*3/uL — ABNORMAL HIGH (ref 4.0–10.5)
nRBC: 0.4 % — ABNORMAL HIGH (ref 0.0–0.2)

## 2021-07-02 LAB — MAGNESIUM: Magnesium: 2.2 mg/dL (ref 1.7–2.4)

## 2021-07-02 LAB — GENETIC SCREENING ORDER

## 2021-07-02 NOTE — Progress Notes (Signed)
REFERRING PROVIDER: Derek Jack, MD [address] [place],  [place] [address]  PRIMARY PROVIDER:  Loman Brooklyn, FNP  PRIMARY REASON FOR VISIT:  1. Malignant neoplasm of left breast in female, estrogen receptor positive, unspecified site of breast ([facility])   2. Family history of breast cancer   3. Family history of pancreatic cancer   4. Family history of colon cancer   5. Family history of thyroid cancer   6. Genetic testing      HISTORY OF PRESENT ILLNESS:   Samantha Mcfarland, a 66 y.o. adult, was seen for a [facility] cancer genetics consultation at the request of Dr. Delton Coombes due to a personal and family history of cancer.  Samantha Mcfarland presents to clinic today to discuss the possibility of a hereditary predisposition to cancer, genetic testing, and to further clarify her future cancer risks, as well as potential cancer risks for family members.   In 2022, at the age of 79, Samantha Mcfarland was diagnosed with invasive ductal carcinoma of the left breast, ER+, PR-, HER2-. The treatment plan included mastectomy 03/23/2021 and she is currently being treated with chemotherapy.    CANCER HISTORY:  Oncology History  Breast cancer, left ([facility])  04/22/2021 Initial Diagnosis   Breast cancer, left ([facility] [facility])   04/22/2021 Cancer Staging   Staging form: Breast, AJCC 8th Edition - Clinical stage from 04/22/2021: Stage IA (cT1c, cN0, cM0, G2, ER+, PR-, HER2-) - Signed by Derek Jack, MD on 04/22/2021 Stage prefix: Initial diagnosis Nuclear grade: G2 Histologic grading system: 3 grade system HER2-IHC interpretation: Negative HER2-IHC value: Score 1+ Ki-67 (%): 25    05/28/2021 -  Chemotherapy   Patient is on Treatment Plan : BREAST TC q21d      Genetic Testing   Negative genetic testing. No pathogenic variants identified on the [facility] Multi-Cancer+RNA panel. The report date is 05/10/2021.  The Multi-Cancer Panel + RNA offered by [facility] includes sequencing and/or deletion duplication testing of  the following 84 genes: AIP, ALK, APC, ATM, AXIN2,BAP1,  BARD1, BLM, BMPR1A, BRCA1, BRCA2, BRIP1, CASR, CDC73, CDH1, CDK4, CDKN1B, CDKN1C, CDKN2A (p14ARF), CDKN2A (p16INK4a), CEBPA, CHEK2, CTNNA1, DICER1, DIS3L2, EGFR (c.2369C>T, p.Thr790Met variant only), EPCAM (Deletion/duplication testing only), FH, FLCN, GATA2, GPC3, GREM1 (Promoter region deletion/duplication testing only), HOXB13 (c.251G>A, p.Gly84Glu), HRAS, KIT, MAX, MEN1, MET, MITF (c.952G>A, p.Glu318Lys variant only), MLH1, MSH2, MSH3, MSH6, MUTYH, NBN, NF1, NF2, NTHL1, PALB2, PDGFRA, PHOX2B, PMS2, POLD1, POLE, POT1, PRKAR1A, PTCH1, PTEN, RAD50, RAD51C, RAD51D, RB1, RECQL4, RET, RUNX1, SDHAF2, SDHA (sequence changes only), SDHB, SDHC, SDHD, SMAD4, SMARCA4, SMARCB1, SMARCE1, STK11, SUFU, TERC, TERT, TMEM127, TP53, TSC1, TSC2, VHL, WRN and WT1.      RISK FACTORS:  Menarche was at age 14.  OCP use for approximately 0 years.  Ovaries intact: 1 intact.  Hysterectomy: no.  Menopausal status: postmenopausal.  HRT use: 0 years. Colonoscopy: no  Past Medical History:  Diagnosis Date   Allergy    Anxiety    Breast cancer ([facility])    Depression    Family history of breast cancer    Family history of colon cancer    Family history of pancreatic cancer    Family history of thyroid cancer    GERD (gastroesophageal reflux disease)    Hyperlipidemia    Hypertension    Osteopenia 04/30/2021   Port-A-Cath in place 05/07/2021   Vitamin D insufficiency     Past Surgical History:  Procedure Laterality Date   EXCISION OF SKIN TAG Right 05/27/2021   Procedure: EXCISION OF  RIGHT SKIN TAG;  Surgeon: Virl Cagey, MD;  Location: [facility];  Service: General;  Laterality: Right;   MASTECTOMY W/ SENTINEL NODE BIOPSY Left 03/23/2021   Procedure: TOTAL MASTECTOMY WITH SENTINEL LYMPH NODE BIOPSY;  Surgeon: Virl Cagey, MD;  Location: [facility];  Service: General;  Laterality: Left;   NECK SURGERY     OOPHORECTOMY     PORTACATH PLACEMENT Right  05/27/2021   Procedure: INSERTION PORT-A-CATH;  Surgeon: Virl Cagey, MD;  Location: [facility];  Service: General;  Laterality: Right;   SALPINGECTOMY      Social History   Socioeconomic History   Marital status: Divorced    Spouse name: Not on file   Number of children: 0   Years of education: Not on file   Highest education level: Not on file  Occupational History   Occupation: retired  Tobacco Use   Smoking status: Former    Packs/day: 0.25    Types: Cigarettes   Smokeless tobacco: Never   Tobacco comments:    1 pack every 3-4 days  Vaping Use   Vaping Use: Never used  Substance and Sexual Activity   Alcohol use: Yes    Alcohol/week: 20.0 standard drinks    Types: 20 Cans of beer per week    Comment: 3-4 beers per day   Drug use: Never   Sexual activity: Not Currently    Birth control/protection: Post-menopausal  Other Topics Concern   Not on file  Social History Narrative   Sister lives about 15 minutes away   Social Determinants of Health   Financial Resource Strain: Low Risk    Difficulty of Paying Living Expenses: Not hard at all  Food Insecurity: No Food Insecurity   Worried About Charity fundraiser in the Last Year: Never true   Arboriculturist in the Last Year: Never true  Transportation Needs: No Transportation Needs   Lack of Transportation (Medical): No   Lack of Transportation (Non-Medical): No  Physical Activity: Insufficiently Active   Days of Exercise per Week: 5 days   Minutes of Exercise per Session: 20 min  Stress: Stress Concern Present   Feeling of Stress : To some extent  Social Connections: Socially Isolated   Frequency of Communication with Friends and Family: More than three times a week   Frequency of Social Gatherings with Friends and Family: More than three times a week   Attends Religious Services: Never   Marine scientist or Organizations: No   Attends Music therapist: Never   Marital Status: Divorced      FAMILY HISTORY:  We obtained a detailed, 4-generation family history.  Significant diagnoses are listed below: Family History  Problem Relation Age of Onset   Stroke Mother    Alcohol abuse Mother    Diabetes Father    Pancreatic cancer Father 79   Testicular cancer Father        dx 78s   Pancreatic disease Father    Breast cancer Maternal Aunt        dx 50s-60s   Breast cancer Paternal Aunt 34   Thyroid cancer Paternal Uncle    Breast cancer Maternal Grandmother        dx 41s   Colon cancer Paternal Grandfather        dx 2s   Samantha Mcfarland has 1 full sister, Santiago Glad ,32, and she had 1 maternal half sister who died at 64. She has 3 nephews and  2 nieces.   Samantha Mcfarland mother died at 44, no history of cancer. Patient had 2 maternal aunts. One aunt had breast cancer in her 42s-60s and died at 64. She has a maternal cousin who had vaginal cancer. Maternal grandmother had breast cancer in her 72s and died at 64.  Samantha Mcfarland father had testicular cancer in his 51s and died of pancreatic cancer at 62. Patient had 1 paternal aunt, 1 uncle. Her aunt was recently diagnosed with breast cancer and her uncle died of thyroid cancer in his 68s. Paternal grandmother died at 27. Grandfather had colon cancer in his 28s and died at 68, he had a sister who died of ovarian cancer and mother who died of stomach cancer.  Samantha Mcfarland is unaware of previous family history of genetic testing for hereditary cancer risks. Patient's maternal ancestors are of Scottish/European descent, and paternal ancestors are off Scottish/European descent. There is no reported Ashkenazi Jewish ancestry. There is no known consanguinity.    GENETIC COUNSELING ASSESSMENT: Samantha Mcfarland is a 66 y.o. adult with a personal and family history of cancer which is somewhat suggestive of a hereditary cancer syndrome and predisposition to cancer. We, therefore, discussed and recommended the following at today's visit.   DISCUSSION: We discussed that  approximately 10% of breast cancer is hereditary. Most cases of hereditary breast cancer are associated with BRCA1/BRCA2 genes, although there are other genes associated with hereditary cancer as well including. Cancers and risks are gene specific.  We discussed that testing is beneficial for several reasons including knowing about other cancer risks, identifying potential screening and risk-reduction options that may be appropriate, and to understand if other family members could be at risk for cancer and allow them to undergo genetic testing.   We reviewed the characteristics, features and inheritance patterns of hereditary cancer syndromes. We also discussed genetic testing, including the appropriate family members to test, the process of testing, insurance coverage and turn-around-time for results. We discussed the implications of a negative, positive and/or variant of uncertain significant result. We recommended Samantha Mcfarland pursue genetic testing for the [facility] Multi-Cancer+RNA gene panel, which she had drawn and completed in November 2022.   GENETIC TEST RESULTS: Genetic testing reported out on 05/10/2021 through the [facility] Multi-Cancer+RNA cancer panel found no pathogenic mutations.   The Multi-Cancer Panel + RNA offered by [facility] includes sequencing and/or deletion duplication testing of the following 84 genes: AIP, ALK, APC, ATM, AXIN2,BAP1,  BARD1, BLM, BMPR1A, BRCA1, BRCA2, BRIP1, CASR, CDC73, CDH1, CDK4, CDKN1B, CDKN1C, CDKN2A (p14ARF), CDKN2A (p16INK4a), CEBPA, CHEK2, CTNNA1, DICER1, DIS3L2, EGFR (c.2369C>T, p.Thr790Met variant only), EPCAM (Deletion/duplication testing only), FH, FLCN, GATA2, GPC3, GREM1 (Promoter region deletion/duplication testing only), HOXB13 (c.251G>A, p.Gly84Glu), HRAS, KIT, MAX, MEN1, MET, MITF (c.952G>A, p.Glu318Lys variant only), MLH1, MSH2, MSH3, MSH6, MUTYH, NBN, NF1, NF2, NTHL1, PALB2, PDGFRA, PHOX2B, PMS2, POLD1, POLE, POT1, PRKAR1A, PTCH1, PTEN, RAD50, RAD51C,  RAD51D, RB1, RECQL4, RET, RUNX1, SDHAF2, SDHA (sequence changes only), SDHB, SDHC, SDHD, SMAD4, SMARCA4, SMARCB1, SMARCE1, STK11, SUFU, TERC, TERT, TMEM127, TP53, TSC1, TSC2, VHL, WRN and WT1.  The test report has been scanned into EPIC and is located under the Molecular Pathology section of the Results Review tab.  A portion of the result report is included below for reference.     We discussed that because current genetic testing is not perfect, it is possible there may be a gene mutation in one of these genes that current testing cannot detect, but that chance is small.  There could  be another gene that has not yet been discovered, or that we have not yet tested, that is responsible for the cancer diagnoses in the family. It is also possible there is a hereditary cause for the cancer in the family that Samantha Mcfarland did not inherit and therefore was not identified in her testing.  Therefore, it is important to remain in touch with cancer genetics in the future so that we can continue to offer Samantha Mcfarland the most up to date genetic testing.   ADDITIONAL GENETIC TESTING: We discussed with Samantha Mcfarland that her genetic testing was fairly extensive.  If there are genes identified to increase cancer risk that can be analyzed in the future, we would be happy to discuss and coordinate this testing at that time.    CANCER SCREENING RECOMMENDATIONS: Samantha Mcfarland test result is considered negative (normal).  This means that we have not identified a hereditary cause for her  personal and family history of cancer at this time. Most cancers happen by chance and this negative test suggests that her cancer may fall into this category.    While reassuring, this does not definitively rule out a hereditary predisposition to cancer. It is still possible that there could be genetic mutations that are undetectable by current technology. There could be genetic mutations in genes that have not been tested or identified to increase  cancer risk.  Therefore, it is recommended she continue to follow the cancer management and screening guidelines provided by her oncology and primary healthcare provider.   An individual's cancer risk and medical management are not determined by genetic test results alone. Overall cancer risk assessment incorporates additional factors, including personal medical history, family history, and any available genetic information that may result in a personalized plan for cancer prevention and surveillance.  RECOMMENDATIONS FOR FAMILY MEMBERS:  Relatives in this family might be at some increased risk of developing cancer, over the general population risk, simply due to the family history of cancer.  We recommended female relatives in this family have a yearly mammogram beginning at age 54, or 47 years younger than the earliest onset of cancer, an annual clinical breast exam, and perform monthly breast self-exams. Female relatives in this family should also have a gynecological exam as recommended by their primary provider.  All family members should be referred for colonoscopy starting at age 56.    It is also possible there is a hereditary cause for the cancer in Samantha Mcfarland's family that she did not inherit and therefore was not identified in her.  Based on Samantha Mcfarland's family history, we recommended paternal relatives have genetic counseling and testing. Ms. Morath will let us know if we can be of any assistance in coordinating genetic counseling and/or testing for these family members.  FOLLOW-UP: Lastly, we discussed with Ms. Tuma that cancer genetics is a rapidly advancing field and it is possible that new genetic tests will be appropriate for her and/or her family members in the future. We encouraged her to remain in contact with cancer genetics on an annual basis so we can update her personal and family histories and let her know of advances in cancer genetics that may benefit this family.   Ms. Baksh  questions were answered to her satisfaction today. Our contact information was provided should additional questions or concerns arise. Thank you for the referral and allowing [place] to share in the care of your patient.   Faith Rogue, MS, [facility] Genetic Counselor [email or web address] Phone: [phone]  The patient was seen for a total of 35 minutes in face-to-face genetic counseling.  Patient was seen with her sister, Santiago Glad. Dr. Grayland Ormond was available for discussion regarding this case.   _______________________________________________________________________ For Office Staff:  Number of people involved in session: 2 Was an Intern/ student involved with case: no

## 2021-07-02 NOTE — Telephone Encounter (Signed)
Pt called and wants to talk to you. Call back at [phone]

## 2021-07-09 ENCOUNTER — Inpatient Hospital Stay (HOSPITAL_COMMUNITY): Payer: Medicare HMO

## 2021-07-09 ENCOUNTER — Inpatient Hospital Stay (HOSPITAL_BASED_OUTPATIENT_CLINIC_OR_DEPARTMENT_OTHER): Payer: Medicare HMO | Admitting: Hematology

## 2021-07-09 ENCOUNTER — Other Ambulatory Visit: Payer: Self-pay

## 2021-07-09 VITALS — BP 103/63 | HR 63 | Temp 97.4°F

## 2021-07-09 DIAGNOSIS — Z87891 Personal history of nicotine dependence: Secondary | ICD-10-CM | POA: Diagnosis not present

## 2021-07-09 DIAGNOSIS — Z170 Estrogen receptor positive status [ER+]: Secondary | ICD-10-CM

## 2021-07-09 DIAGNOSIS — M8580 Other specified disorders of bone density and structure, unspecified site: Secondary | ICD-10-CM | POA: Diagnosis not present

## 2021-07-09 DIAGNOSIS — Z5111 Encounter for antineoplastic chemotherapy: Secondary | ICD-10-CM | POA: Diagnosis present

## 2021-07-09 DIAGNOSIS — Z5189 Encounter for other specified aftercare: Secondary | ICD-10-CM | POA: Diagnosis not present

## 2021-07-09 DIAGNOSIS — Z800 Family history of malignant neoplasm of digestive organs: Secondary | ICD-10-CM | POA: Diagnosis not present

## 2021-07-09 DIAGNOSIS — Z79899 Other long term (current) drug therapy: Secondary | ICD-10-CM | POA: Diagnosis not present

## 2021-07-09 DIAGNOSIS — Z8042 Family history of malignant neoplasm of prostate: Secondary | ICD-10-CM | POA: Diagnosis not present

## 2021-07-09 DIAGNOSIS — C50912 Malignant neoplasm of unspecified site of left female breast: Secondary | ICD-10-CM

## 2021-07-09 DIAGNOSIS — Z95828 Presence of other vascular implants and grafts: Secondary | ICD-10-CM

## 2021-07-09 DIAGNOSIS — Z9012 Acquired absence of left breast and nipple: Secondary | ICD-10-CM | POA: Diagnosis not present

## 2021-07-09 LAB — CBC WITH DIFFERENTIAL/PLATELET
Abs Immature Granulocytes: 0.05 10*3/uL (ref 0.00–0.07)
Basophils Absolute: 0.1 10*3/uL (ref 0.0–0.1)
Basophils Relative: 1 %
Eosinophils Absolute: 0.1 10*3/uL (ref 0.0–0.5)
Eosinophils Relative: 1 %
HCT: 29.9 % — ABNORMAL LOW (ref 36.0–46.0)
Hemoglobin: 10.1 g/dL — ABNORMAL LOW (ref 12.0–15.0)
Immature Granulocytes: 1 %
Lymphocytes Relative: 20 %
Lymphs Abs: 1.3 10*3/uL (ref 0.7–4.0)
MCH: 34 pg (ref 26.0–34.0)
MCHC: 33.8 g/dL (ref 30.0–36.0)
MCV: 100.7 fL — ABNORMAL HIGH (ref 80.0–100.0)
Monocytes Absolute: 0.9 10*3/uL (ref 0.1–1.0)
Monocytes Relative: 14 %
Neutro Abs: 4.4 10*3/uL (ref 1.7–7.7)
Neutrophils Relative %: 63 %
Platelets: 368 10*3/uL (ref 150–400)
RBC: 2.97 MIL/uL — ABNORMAL LOW (ref 3.87–5.11)
RDW: 14.5 % (ref 11.5–15.5)
WBC: 6.9 10*3/uL (ref 4.0–10.5)
nRBC: 0.3 % — ABNORMAL HIGH (ref 0.0–0.2)

## 2021-07-09 LAB — COMPREHENSIVE METABOLIC PANEL
ALT: 12 U/L (ref 0–44)
AST: 22 U/L (ref 15–41)
Albumin: 3.9 g/dL (ref 3.5–5.0)
Alkaline Phosphatase: 63 U/L (ref 38–126)
Anion gap: 7 (ref 5–15)
BUN: 10 mg/dL (ref 8–23)
CO2: 25 mmol/L (ref 22–32)
Calcium: 8.7 mg/dL — ABNORMAL LOW (ref 8.9–10.3)
Chloride: 100 mmol/L (ref 98–111)
Creatinine, Ser: 0.64 mg/dL (ref 0.44–1.00)
GFR, Estimated: >60 mL/min (ref >60)
Glucose, Bld: 77 mg/dL (ref 70–99)
Potassium: 4.2 mmol/L (ref 3.5–5.1)
Sodium: 132 mmol/L — ABNORMAL LOW (ref 135–145)
Total Bilirubin: 0.5 mg/dL (ref 0.3–1.2)
Total Protein: 6.4 g/dL — ABNORMAL LOW (ref 6.5–8.1)

## 2021-07-09 LAB — MAGNESIUM: Magnesium: 2.1 mg/dL (ref 1.7–2.4)

## 2021-07-09 MED ORDER — SODIUM CHLORIDE 0.9 % IV SOLN
60.0000 mg/m2 | Freq: Once | INTRAVENOUS | Status: AC
  Administered 2021-07-09: 100 mg via INTRAVENOUS
  Filled 2021-07-09: qty 10

## 2021-07-09 MED ORDER — SODIUM CHLORIDE 0.9% FLUSH
10.0000 mL | INTRAVENOUS | Status: DC | PRN
  Administered 2021-07-09: 10 mL

## 2021-07-09 MED ORDER — HEPARIN SOD (PORK) LOCK FLUSH 100 UNIT/ML IV SOLN
500.0000 [IU] | Freq: Once | INTRAVENOUS | Status: AC | PRN
  Administered 2021-07-09: 500 [IU]

## 2021-07-09 MED ORDER — SODIUM CHLORIDE 0.9 % IV SOLN: Freq: Once | INTRAVENOUS | Status: AC

## 2021-07-09 MED ORDER — PALONOSETRON HCL INJECTION 0.25 MG/5ML
0.2500 mg | Freq: Once | INTRAVENOUS | Status: AC
  Administered 2021-07-09: 0.25 mg via INTRAVENOUS
  Filled 2021-07-09: qty 5

## 2021-07-09 MED ORDER — SODIUM CHLORIDE 0.9 % IV SOLN
480.0000 mg/m2 | Freq: Once | INTRAVENOUS | Status: AC
  Administered 2021-07-09: 780 mg via INTRAVENOUS
  Filled 2021-07-09: qty 39

## 2021-07-09 MED ORDER — SODIUM CHLORIDE 0.9 % IV SOLN
10.0000 mg | Freq: Once | INTRAVENOUS | Status: AC
  Administered 2021-07-09: 10 mg via INTRAVENOUS
  Filled 2021-07-09: qty 10

## 2021-07-09 NOTE — Patient Instructions (Signed)
[facility] Discharge Instructions   You were seen and examined today by Dr. Delton Coombes.  He reviewed your lab work with you - results are normal/stable.  We will proceed with treatment today.  Return as scheduled for lab work, office visit, and treatment.    Thank you for choosing [facility] at [facility] to provide your oncology and hematology care.  To afford each patient quality time with our provider, please arrive at least 15 minutes before your scheduled appointment time.   If you have a lab appointment with the [facility] please come in thru the Main Entrance and check in at the main information desk.  You need to re-schedule your appointment should you arrive 10 or more minutes late.  We strive to give you quality time with our providers, and arriving late affects you and other patients whose appointments are after yours.  Also, if you no show three or more times for appointments you may be dismissed from the clinic at the providers discretion.     Again, thank you for choosing [facility].  Our hope is that these requests will decrease the amount of time that you wait before being seen by our physicians.       _____________________________________________________________  Should you have questions after your visit to [facility], please contact our office at [phone] and follow the prompts.  Our office hours are 8:00 a.m. and 4:30 p.m. Monday - Friday.  Please note that voicemails left after 4:00 p.m. may not be returned until the following business day.  We are closed weekends and major holidays.  You do have access to a nurse 24-7, just call the main number to the clinic [phone] and do not press any options, hold on the line and a nurse will answer the phone.    For prescription refill requests, have your pharmacy contact our office and allow 72 hours.    Due to Covid, you will  need to wear a mask upon entering the hospital. If you do not have a mask, a mask will be given to you at the Main Entrance upon arrival. For doctor visits, patients may have 1 support person age 73 or older with them. For treatment visits, patients can not have anyone with them due to social distancing guidelines and our immunocompromised population.

## 2021-07-09 NOTE — Progress Notes (Signed)
[facility] [address], [place] [address]   CLINIC:  Medical Oncology/Hematology  PCP:  Samantha Fudge, FNP [address] [place] [address] [phone]   REASON FOR VISIT:  Follow-up for left breast cancer  PRIOR THERAPY: none  NGS Results: not done  CURRENT THERAPY: Taxotere and Cytoxan ever 3 weeks  BRIEF ONCOLOGIC HISTORY:  Oncology History  Breast cancer, left (HCC)  04/22/2021 Initial Diagnosis   Breast cancer, left (HCC)   04/22/2021 Cancer Staging   Staging form: Breast, AJCC 8th Edition - Clinical stage from 04/22/2021: Stage IA (cT1c, cN0, cM0, G2, ER+, PR-, HER2-) - Signed by Samantha Massed, MD on 04/22/2021 Stage prefix: Initial diagnosis Nuclear grade: G2 Histologic grading system: 3 grade system HER2-IHC interpretation: Negative HER2-IHC value: Score 1+ Ki-67 (%): 25    05/28/2021 -  Chemotherapy   Patient is on Treatment Plan : BREAST TC q21d      Genetic Testing   Negative genetic testing. No pathogenic variants identified on the [facility] Multi-Cancer+RNA panel. The report date is 05/10/2021.  The Multi-Cancer Panel + RNA offered by [facility] includes sequencing and/or deletion duplication testing of the following 84 genes: AIP, ALK, APC, ATM, AXIN2,BAP1,  BARD1, BLM, BMPR1A, BRCA1, BRCA2, BRIP1, CASR, CDC73, CDH1, CDK4, CDKN1B, CDKN1C, CDKN2A (p14ARF), CDKN2A (p16INK4a), CEBPA, CHEK2, CTNNA1, DICER1, DIS3L2, EGFR (c.2369C>T, p.Thr790Met variant only), EPCAM (Deletion/duplication testing only), FH, FLCN, GATA2, GPC3, GREM1 (Promoter region deletion/duplication testing only), HOXB13 (c.251G>A, p.Gly84Glu), HRAS, KIT, MAX, MEN1, MET, MITF (c.952G>A, p.Glu318Lys variant only), MLH1, MSH2, MSH3, MSH6, MUTYH, NBN, NF1, NF2, NTHL1, PALB2, PDGFRA, PHOX2B, PMS2, POLD1, POLE, POT1, PRKAR1A, PTCH1, PTEN, RAD50, RAD51C, RAD51D, RB1, RECQL4, RET, RUNX1, SDHAF2, SDHA (sequence changes only), SDHB, SDHC, SDHD, SMAD4, SMARCA4, SMARCB1,  SMARCE1, STK11, SUFU, TERC, TERT, TMEM127, TP53, TSC1, TSC2, VHL, WRN and WT1.     CANCER STAGING:  Cancer Staging  Breast cancer, left [facility]) Staging form: Breast, AJCC 8th Edition - Clinical stage from 04/22/2021: Stage IA (cT1c, cN0, cM0, G2, ER+, PR-, HER2-) - Signed by Samantha Massed, MD on 04/22/2021   INTERVAL HISTORY:  [name], a 66 y.o. adult, returns for routine follow-up and consideration for next cycle of chemotherapy. [name] was last seen on 06/18/2021.  Due for cycle #3 of TC today.   Overall, she tells me she has been feeling pretty well. She reports continued constant pain her calves for which she is taking oxycodone prn, 1-2 times daily which helps. She reports constant numbness and tingling in her fingertips bilaterally since her last treatment. This has not affected her fine motor skills. She denies n/v/d. She reports occasional headaches. She reports occasional episodes of light-headedness. She denies mouth sores and ankle swellings. Her appetite had improved. She occasional drinks Ensure, about 1-2 times weekly. She has gained 5 lbs since her last visit.  Overall, she feels ready for next cycle of chemo today.   REVIEW OF SYSTEMS:  Review of Systems  Constitutional:  Negative for appetite change, fatigue and unexpected weight change (+5 lbs).  HENT:   Negative for mouth sores.   Cardiovascular:  Negative for leg swelling.  Gastrointestinal:  Negative for diarrhea, nausea and vomiting.  Musculoskeletal:  Positive for arthralgias (7/10 legs).  Neurological:  Positive for dizziness, headaches, light-headedness and numbness.  Psychiatric/Behavioral:  Positive for depression and sleep disturbance.   All other systems reviewed and are negative.  PAST MEDICAL/SURGICAL HISTORY:  Past Medical History:  Diagnosis Date   Allergy  Anxiety    Breast cancer ([place])    Depression    Family history of breast cancer    Family history of colon cancer    Family  history of pancreatic cancer    Family history of thyroid cancer    GERD (gastroesophageal reflux disease)    Hyperlipidemia    Hypertension    Osteopenia 04/30/2021   Port-A-Cath in place 05/07/2021   Vitamin D insufficiency    Past Surgical History:  Procedure Laterality Date   EXCISION OF SKIN TAG Right 05/27/2021   Procedure: EXCISION OF RIGHT SKIN TAG;  Surgeon: Samantha Cagey, MD;  Location: [facility];  Service: General;  Laterality: Right;   MASTECTOMY W/ SENTINEL NODE BIOPSY Left 03/23/2021   Procedure: TOTAL MASTECTOMY WITH SENTINEL LYMPH NODE BIOPSY;  Surgeon: Samantha Cagey, MD;  Location: [facility];  Service: General;  Laterality: Left;   NECK SURGERY     OOPHORECTOMY     PORTACATH PLACEMENT Right 05/27/2021   Procedure: INSERTION PORT-A-CATH;  Surgeon: Samantha Cagey, MD;  Location: [facility];  Service: General;  Laterality: Right;   SALPINGECTOMY      SOCIAL HISTORY:  Social History   Socioeconomic History   Marital status: Divorced    Spouse name: Not on file   Number of children: 0   Years of education: Not on file   Highest education level: Not on file  Occupational History   Occupation: retired  Tobacco Use   Smoking status: Former    Packs/day: 0.25    Types: Cigarettes   Smokeless tobacco: Never   Tobacco comments:    1 pack every 3-4 days  Vaping Use   Vaping Use: Never used  Substance and Sexual Activity   Alcohol use: Yes    Alcohol/week: 20.0 standard drinks    Types: 20 Cans of beer per week    Comment: 3-4 beers per day   Drug use: Never   Sexual activity: Not Currently    Birth control/protection: Post-menopausal  Other Topics Concern   Not on file  Social History Narrative   Sister lives about 15 minutes away   Social Determinants of Health   Financial Resource Strain: Low Risk    Difficulty of Paying Living Expenses: Not hard at all  Food Insecurity: No Food Insecurity   Worried About Charity fundraiser in the Last Year: Never  true   Arboriculturist in the Last Year: Never true  Transportation Needs: No Transportation Needs   Lack of Transportation (Medical): No   Lack of Transportation (Non-Medical): No  Physical Activity: Insufficiently Active   Days of Exercise per Week: 5 days   Minutes of Exercise per Session: 20 min  Stress: Stress Concern Present   Feeling of Stress : To some extent  Social Connections: Socially Isolated   Frequency of Communication with Friends and Family: More than three times a week   Frequency of Social Gatherings with Friends and Family: More than three times a week   Attends Religious Services: Never   Marine scientist or Organizations: No   Attends Music therapist: Never   Marital Status: Divorced  Human resources officer Violence: Not At Risk   Fear of Current or Ex-Partner: No   Emotionally Abused: No   Physically Abused: No   Sexually Abused: No    FAMILY HISTORY:  Family History  Problem Relation Age of Onset   Stroke Mother    Alcohol abuse Mother  Diabetes Father    Pancreatic cancer Father 37   Testicular cancer Father        dx 37s   Pancreatic disease Father    Breast cancer Maternal Aunt        dx 50s-60s   Breast cancer Paternal Aunt 92   Thyroid cancer Paternal Uncle    Breast cancer Maternal Grandmother        dx 29s   Colon cancer Paternal Grandfather        dx 67s    CURRENT MEDICATIONS:  Current Outpatient Medications  Medication Sig Dispense Refill   acetaminophen (TYLENOL) 325 MG tablet Take 325 mg by mouth every 6 (six) hours as needed for moderate pain or headache.     atorvastatin (LIPITOR) 20 MG tablet Take 1 tablet (20 mg total) by mouth daily. 30 tablet 2   CYCLOPHOSPHAMIDE IV Inject into the vein every 21 ( twenty-one) days.     DOCEtaxel (TAXOTERE IV) Inject into the vein every 21 ( twenty-one) days.     FLUoxetine (PROZAC) 20 MG capsule Take 1 capsule (20 mg total) by mouth daily. 30 capsule 2   lisinopril (ZESTRIL)  10 MG tablet Take 1 tablet (10 mg total) by mouth daily with lunch. 30 tablet 2   omeprazole (PRILOSEC) 20 MG capsule Take 1 capsule (20 mg total) by mouth daily as needed (indigestion/heartburn). (Patient taking differently: Take 20 mg by mouth daily.) 30 capsule 2   oxyCODONE (OXY IR/ROXICODONE) 5 MG immediate release tablet TAKE ONE TABLET BY MOUTH EVERY 8 HOURS AS NEEDED FOR SEVERE PAIN OR BREAKTHROUGH PAIN 30 tablet 0   traMADol (ULTRAM) 50 MG tablet Take 1 tablet (50 mg total) by mouth every 6 (six) hours as needed for severe pain. 30 tablet 0   vitamin B-12 (CYANOCOBALAMIN) 500 MCG tablet Take 500 mcg by mouth daily.     lidocaine-prilocaine (EMLA) cream Apply a small amount to port a cath site (do not rub in) and cover with plastic wrap 1 hour prior to chemotherapy appointments (Patient not taking: Reported on 07/09/2021) 30 g 3   prochlorperazine (COMPAZINE) 10 MG tablet Take 1 tablet (10 mg total) by mouth every 6 (six) hours as needed (Nausea or vomiting). (Patient not taking: Reported on 07/09/2021) 30 tablet 1   No current facility-administered medications for this visit.    ALLERGIES:  Allergies  Allergen Reactions   Ibuprofen Other (See Comments)    "Stomach upset"   Penicillins     Allergic as a child. (Rash ???) Does not recall what type of reaction she has to drug.    PHYSICAL EXAM:  Performance status (ECOG): 1 - Symptomatic but completely ambulatory  There were no vitals filed for this visit. Wt Readings from Last 3 Encounters:  07/09/21 131 lb (59.4 kg)  06/18/21 126 lb 1.7 oz (57.2 kg)  06/04/21 126 lb 1.7 oz (57.2 kg)   Physical Exam Vitals reviewed.  Constitutional:      Appearance: Normal appearance.  Cardiovascular:     Rate and Rhythm: Normal rate and regular rhythm.     Pulses: Normal pulses.     Heart sounds: Normal heart sounds.  Pulmonary:     Effort: Pulmonary effort is normal.     Breath sounds: Normal breath sounds.  Neurological:     General:  No focal deficit present.     Mental Status: She is alert and oriented to person, place, and time.  Psychiatric:  Mood and Affect: Mood normal.        Behavior: Behavior normal.    LABORATORY DATA:  I have reviewed the labs as listed.  CBC Latest Ref Rng & Units 07/09/2021 07/02/2021 06/18/2021  WBC 4.0 - 10.5 K/uL 6.9 27.6(H) 9.0  Hemoglobin 12.0 - 15.0 g/dL 10.1(L) 10.1(L) 10.7(L)  Hematocrit 36.0 - 46.0 % 29.9(L) 30.7(L) 31.5(L)  Platelets 150 - 400 K/uL 368 289 334   CMP Latest Ref Rng & Units 07/09/2021 07/02/2021 06/18/2021  Glucose 70 - 99 mg/dL 77 82 80  BUN 8 - 23 mg/dL 10 7(L) 12  Creatinine 0.44 - 1.00 mg/dL 0.64 0.62 0.62  Sodium 135 - 145 mmol/L 132(L) 131(L) 133(L)  Potassium 3.5 - 5.1 mmol/L 4.2 4.1 4.1  Chloride 98 - 111 mmol/L 100 98 100  CO2 22 - 32 mmol/L $RemoveB'25 27 26  'jstovSfg$ Calcium 8.9 - 10.3 mg/dL 8.7(L) 8.7(L) 9.0  Total Protein 6.5 - 8.1 g/dL 6.4(L) 7.1 6.8  Total Bilirubin 0.3 - 1.2 mg/dL 0.5 0.2(L) 0.2(L)  Alkaline Phos 38 - 126 U/L 63 98 59  AST 15 - 41 U/L $Remo'22 23 23  'kzbYQ$ ALT 0 - 44 U/L $Remo'12 13 13    'wDECP$ DIAGNOSTIC IMAGING:  I have independently reviewed the scans and discussed with the patient. No results found.   ASSESSMENT:  Stage I (T1CN0) left breast IDC, ER positive, HER2/PR negative: - Abnormal mammogram followed by left breast subareolar mass biopsy on 02/24/2021 consistent with DCIS intermediate to high-grade - Left mastectomy and SLNB on 03/23/2021 - Pathology shows 1.5 cm IDC, grade 2, extensive DCIS, margins negative, 0/2 left axillary lymph nodes, PT1CN0.  ER 100%.  PR-negative.  HER2 1+.  Ki-67-25%. - Oncotype DX showed recurrence score 33.  There is more than 15% chemotherapy benefit.  Distant recurrence at 9 years with tamoxifen alone was 21%. - Cycle 1 of dose attenuated TC on 05/28/2021. - [facility] genetic testing negative.   Social/family history: - She lives by herself.  She is seen with her sister today. 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 - She quit smoking 1 month ago.  Half pack per day for 35 years. - She drinks alcohol 3-5 beers daily. - Father had prostate and pancreatic cancer.  Maternal grandmother and maternal aunt had breast cancer.  Paternal grandfather had colon cancer.  Other people in her family also had cancers, type unknown to the patient.     PLAN:  Stage I (T1CN0) left breast IDC, ER positive, HER2/PR negative, Oncotype DX 33: - She has tolerated cycle 2 reasonably well. - She did not have any major GI side effects.  She is eating better and gained 5 pounds. - She reported tingling/numbness in the first 3 finger tips of both hands. - Reviewed labs today which showed normal LFTs.  CBC was grossly normal. - Proceed with cycle 3 today.  RTC 3 weeks for follow-up.  2.  Bone health (osteopenia): - DEXA scan on 04/30/2021 with T score -1.5. - Continue calcium and vitamin D supplements.  3.  Taxane induced leg pains: - Continue oxycodone 5 mg 1 to 2 tablets daily.   Orders placed this encounter:  No orders of the defined types were placed in this encounter.    Derek Jack, MD [place] [phone]   I, Thana Ates, am acting as a scribe for Dr. Derek Jack.  I, Derek Jack MD, have reviewed the above documentation for accuracy and completeness, and  I agree with the above.

## 2021-07-09 NOTE — Progress Notes (Signed)
Labs reviewed today. Will treat per MD.   Treatment given per orders. Patient tolerated it well without problems. Vitals stable and discharged home from clinic ambulatory. Follow up as scheduled.

## 2021-07-09 NOTE — Progress Notes (Signed)
Patient has been examined by Dr. Katragadda, and vital signs and labs have been reviewed. ANC, Creatinine, LFTs, hemoglobin, and platelets are within treatment parameters per M.D. - pt may proceed with treatment.    °

## 2021-07-09 NOTE — Patient Instructions (Signed)
[facility]  Discharge Instructions: Thank you for choosing [facility] to provide your oncology and hematology care.  If you have a lab appointment with the Cancer Center, please come in thru the Main Entrance and check in at the main information desk.  Wear comfortable clothing and clothing appropriate for easy access to any Portacath or PICC line.   We strive to give you quality time with your provider. You may need to reschedule your appointment if you arrive late (15 or more minutes).  Arriving late affects you and other patients whose appointments are after yours.  Also, if you miss three or more appointments without notifying the office, you may be dismissed from the clinic at the provider's discretion.      For prescription refill requests, have your pharmacy contact our office and allow 72 hours for refills to be completed.        To help prevent nausea and vomiting after your treatment, we encourage you to take your nausea medication as directed.  BELOW ARE SYMPTOMS THAT SHOULD BE REPORTED IMMEDIATELY: *FEVER GREATER THAN 100.4 F (38 C) OR HIGHER *CHILLS OR SWEATING *NAUSEA AND VOMITING THAT IS NOT CONTROLLED WITH YOUR NAUSEA MEDICATION *UNUSUAL SHORTNESS OF BREATH *UNUSUAL BRUISING OR BLEEDING *URINARY PROBLEMS (pain or burning when urinating, or frequent urination) *BOWEL PROBLEMS (unusual diarrhea, constipation, pain near the anus) TENDERNESS IN MOUTH AND THROAT WITH OR WITHOUT PRESENCE OF ULCERS (sore throat, sores in mouth, or a toothache) UNUSUAL RASH, SWELLING OR PAIN  UNUSUAL VAGINAL DISCHARGE OR ITCHING   Items with * indicate a potential emergency and should be followed up as soon as possible or go to the Emergency Department if any problems should occur.  Please show the CHEMOTHERAPY ALERT CARD or IMMUNOTHERAPY ALERT CARD at check-in to the Emergency Department and triage nurse.  Should you have questions after your visit or need to cancel  or reschedule your appointment, please contact [facility] [phone]  and follow the prompts.  Office hours are 8:00 a.m. to 4:30 p.m. Monday - Friday. Please note that voicemails left after 4:00 p.m. may not be returned until the following business day.  We are closed weekends and major holidays. You have access to a nurse at all times for urgent questions. Please call the main number to the clinic [phone] and follow the prompts.  For any non-urgent questions, you may also contact your provider using MyChart. We now offer e-Visits for anyone 18 and older to request care online for non-urgent symptoms. For details visit [email or web address].   Also download the MyChart app! Go to the app store, search "MyChart", open the app, select [facility], and log in with your MyChart username and password.  Due to Covid, a mask is required upon entering the hospital/clinic. If you do not have a mask, one will be given to you upon arrival. For doctor visits, patients may have 1 support person aged 18 or older with them. For treatment visits, patients cannot have anyone with them due to current Covid guidelines and our immunocompromised population.  

## 2021-07-10 ENCOUNTER — Inpatient Hospital Stay (HOSPITAL_COMMUNITY): Payer: Medicare HMO

## 2021-07-10 VITALS — BP 114/68 | HR 76 | Temp 98.1°F | Resp 18

## 2021-07-10 DIAGNOSIS — Z800 Family history of malignant neoplasm of digestive organs: Secondary | ICD-10-CM | POA: Diagnosis not present

## 2021-07-10 DIAGNOSIS — M8580 Other specified disorders of bone density and structure, unspecified site: Secondary | ICD-10-CM | POA: Diagnosis not present

## 2021-07-10 DIAGNOSIS — Z87891 Personal history of nicotine dependence: Secondary | ICD-10-CM | POA: Diagnosis not present

## 2021-07-10 DIAGNOSIS — Z8042 Family history of malignant neoplasm of prostate: Secondary | ICD-10-CM | POA: Diagnosis not present

## 2021-07-10 DIAGNOSIS — Z95828 Presence of other vascular implants and grafts: Secondary | ICD-10-CM

## 2021-07-10 DIAGNOSIS — C50912 Malignant neoplasm of unspecified site of left female breast: Secondary | ICD-10-CM

## 2021-07-10 DIAGNOSIS — Z9012 Acquired absence of left breast and nipple: Secondary | ICD-10-CM | POA: Diagnosis not present

## 2021-07-10 DIAGNOSIS — Z170 Estrogen receptor positive status [ER+]: Secondary | ICD-10-CM | POA: Diagnosis not present

## 2021-07-10 DIAGNOSIS — Z5111 Encounter for antineoplastic chemotherapy: Secondary | ICD-10-CM | POA: Diagnosis not present

## 2021-07-10 DIAGNOSIS — Z79899 Other long term (current) drug therapy: Secondary | ICD-10-CM | POA: Diagnosis not present

## 2021-07-10 DIAGNOSIS — Z5189 Encounter for other specified aftercare: Secondary | ICD-10-CM | POA: Diagnosis not present

## 2021-07-10 MED ORDER — PEGFILGRASTIM-CBQV 6 MG/0.6ML ~~LOC~~ SOSY
6.0000 mg | PREFILLED_SYRINGE | Freq: Once | SUBCUTANEOUS | Status: AC
  Administered 2021-07-10: 6 mg via SUBCUTANEOUS
  Filled 2021-07-10: qty 0.6

## 2021-07-10 NOTE — Patient Instructions (Signed)
[facility]  Discharge Instructions: Thank you for choosing [facility] to provide your oncology and hematology care.  If you have a lab appointment with the Cancer Center, please come in thru the Main Entrance and check in at the main information desk.  Wear comfortable clothing and clothing appropriate for easy access to any Portacath or PICC line.   We strive to give you quality time with your provider. You may need to reschedule your appointment if you arrive late (15 or more minutes).  Arriving late affects you and other patients whose appointments are after yours.  Also, if you miss three or more appointments without notifying the office, you may be dismissed from the clinic at the provider's discretion.      For prescription refill requests, have your pharmacy contact our office and allow 72 hours for refills to be completed.    Today you received the following chemotherapy and/or immunotherapy agents Udenyca      To help prevent nausea and vomiting after your treatment, we encourage you to take your nausea medication as directed.  BELOW ARE SYMPTOMS THAT SHOULD BE REPORTED IMMEDIATELY: *FEVER GREATER THAN 100.4 F (38 C) OR HIGHER *CHILLS OR SWEATING *NAUSEA AND VOMITING THAT IS NOT CONTROLLED WITH YOUR NAUSEA MEDICATION *UNUSUAL SHORTNESS OF BREATH *UNUSUAL BRUISING OR BLEEDING *URINARY PROBLEMS (pain or burning when urinating, or frequent urination) *BOWEL PROBLEMS (unusual diarrhea, constipation, pain near the anus) TENDERNESS IN MOUTH AND THROAT WITH OR WITHOUT PRESENCE OF ULCERS (sore throat, sores in mouth, or a toothache) UNUSUAL RASH, SWELLING OR PAIN  UNUSUAL VAGINAL DISCHARGE OR ITCHING   Items with * indicate a potential emergency and should be followed up as soon as possible or go to the Emergency Department if any problems should occur.  Please show the CHEMOTHERAPY ALERT CARD or IMMUNOTHERAPY ALERT CARD at check-in to the Emergency  Department and triage nurse.  Should you have questions after your visit or need to cancel or reschedule your appointment, please contact [facility] [phone]  and follow the prompts.  Office hours are 8:00 a.m. to 4:30 p.m. Monday - Friday. Please note that voicemails left after 4:00 p.m. may not be returned until the following business day.  We are closed weekends and major holidays. You have access to a nurse at all times for urgent questions. Please call the main number to the clinic [phone] and follow the prompts.  For any non-urgent questions, you may also contact your provider using MyChart. We now offer e-Visits for anyone 18 and older to request care online for non-urgent symptoms. For details visit [email or web address].   Also download the MyChart app! Go to the app store, search "MyChart", open the app, select [facility], and log in with your MyChart username and password.  Due to Covid, a mask is required upon entering the hospital/clinic. If you do not have a mask, one will be given to you upon arrival. For doctor visits, patients may have 1 support person aged 18 or older with them. For treatment visits, patients cannot have anyone with them due to current Covid guidelines and our immunocompromised population.  

## 2021-07-10 NOTE — Progress Notes (Signed)
Samantha Mcfarland presents today for Udenyca injection per the provider's orders.  Stable during administration without incident; injection site WNL; see MAR for injection details.  Patient tolerated procedure well and without incident.  No questions or complaints noted at this time. Discharge from clinic ambulatory in stable condition.  Alert and oriented X 3.  Follow up with [facility] as scheduled.

## 2021-07-16 ENCOUNTER — Other Ambulatory Visit (HOSPITAL_COMMUNITY): Payer: Self-pay | Admitting: Hematology

## 2021-07-16 DIAGNOSIS — C50912 Malignant neoplasm of unspecified site of left female breast: Secondary | ICD-10-CM

## 2021-07-16 DIAGNOSIS — Z95828 Presence of other vascular implants and grafts: Secondary | ICD-10-CM

## 2021-07-29 ENCOUNTER — Other Ambulatory Visit (HOSPITAL_COMMUNITY): Payer: Self-pay | Admitting: Hematology

## 2021-07-29 DIAGNOSIS — C50912 Malignant neoplasm of unspecified site of left female breast: Secondary | ICD-10-CM

## 2021-07-29 DIAGNOSIS — Z170 Estrogen receptor positive status [ER+]: Secondary | ICD-10-CM

## 2021-07-30 ENCOUNTER — Inpatient Hospital Stay (HOSPITAL_BASED_OUTPATIENT_CLINIC_OR_DEPARTMENT_OTHER): Payer: Medicare HMO | Admitting: Hematology

## 2021-07-30 ENCOUNTER — Other Ambulatory Visit: Payer: Self-pay

## 2021-07-30 ENCOUNTER — Inpatient Hospital Stay (HOSPITAL_COMMUNITY): Payer: Medicare HMO | Attending: Hematology

## 2021-07-30 ENCOUNTER — Inpatient Hospital Stay (HOSPITAL_COMMUNITY): Payer: Medicare HMO

## 2021-07-30 VITALS — BP 102/62 | HR 62 | Temp 97.7°F | Resp 16

## 2021-07-30 VITALS — Ht 64.5 in | Wt 124.0 lb

## 2021-07-30 DIAGNOSIS — Z170 Estrogen receptor positive status [ER+]: Secondary | ICD-10-CM | POA: Insufficient documentation

## 2021-07-30 DIAGNOSIS — R42 Dizziness and giddiness: Secondary | ICD-10-CM | POA: Diagnosis not present

## 2021-07-30 DIAGNOSIS — K5900 Constipation, unspecified: Secondary | ICD-10-CM | POA: Insufficient documentation

## 2021-07-30 DIAGNOSIS — C50912 Malignant neoplasm of unspecified site of left female breast: Secondary | ICD-10-CM

## 2021-07-30 DIAGNOSIS — R112 Nausea with vomiting, unspecified: Secondary | ICD-10-CM | POA: Insufficient documentation

## 2021-07-30 DIAGNOSIS — Z5111 Encounter for antineoplastic chemotherapy: Secondary | ICD-10-CM | POA: Diagnosis not present

## 2021-07-30 DIAGNOSIS — R197 Diarrhea, unspecified: Secondary | ICD-10-CM | POA: Insufficient documentation

## 2021-07-30 DIAGNOSIS — R5383 Other fatigue: Secondary | ICD-10-CM | POA: Insufficient documentation

## 2021-07-30 DIAGNOSIS — R0602 Shortness of breath: Secondary | ICD-10-CM | POA: Diagnosis not present

## 2021-07-30 DIAGNOSIS — Z87891 Personal history of nicotine dependence: Secondary | ICD-10-CM | POA: Diagnosis not present

## 2021-07-30 DIAGNOSIS — R109 Unspecified abdominal pain: Secondary | ICD-10-CM | POA: Insufficient documentation

## 2021-07-30 DIAGNOSIS — F32A Depression, unspecified: Secondary | ICD-10-CM | POA: Insufficient documentation

## 2021-07-30 DIAGNOSIS — R634 Abnormal weight loss: Secondary | ICD-10-CM | POA: Insufficient documentation

## 2021-07-30 DIAGNOSIS — M2550 Pain in unspecified joint: Secondary | ICD-10-CM | POA: Insufficient documentation

## 2021-07-30 DIAGNOSIS — R630 Anorexia: Secondary | ICD-10-CM | POA: Diagnosis not present

## 2021-07-30 DIAGNOSIS — G479 Sleep disorder, unspecified: Secondary | ICD-10-CM | POA: Insufficient documentation

## 2021-07-30 DIAGNOSIS — R519 Headache, unspecified: Secondary | ICD-10-CM | POA: Diagnosis not present

## 2021-07-30 DIAGNOSIS — I959 Hypotension, unspecified: Secondary | ICD-10-CM | POA: Insufficient documentation

## 2021-07-30 DIAGNOSIS — Z5189 Encounter for other specified aftercare: Secondary | ICD-10-CM | POA: Diagnosis not present

## 2021-07-30 LAB — COMPREHENSIVE METABOLIC PANEL
ALT: 14 U/L (ref 0–44)
AST: 28 U/L (ref 15–41)
Albumin: 4 g/dL (ref 3.5–5.0)
Alkaline Phosphatase: 69 U/L (ref 38–126)
Anion gap: 8 (ref 5–15)
BUN: 6 mg/dL — ABNORMAL LOW (ref 8–23)
CO2: 23 mmol/L (ref 22–32)
Calcium: 8.8 mg/dL — ABNORMAL LOW (ref 8.9–10.3)
Chloride: 97 mmol/L — ABNORMAL LOW (ref 98–111)
Creatinine, Ser: 0.66 mg/dL (ref 0.44–1.00)
GFR, Estimated: >60 mL/min (ref >60)
Glucose, Bld: 90 mg/dL (ref 70–99)
Potassium: 4.3 mmol/L (ref 3.5–5.1)
Sodium: 128 mmol/L — ABNORMAL LOW (ref 135–145)
Total Bilirubin: 0.5 mg/dL (ref 0.3–1.2)
Total Protein: 6.3 g/dL — ABNORMAL LOW (ref 6.5–8.1)

## 2021-07-30 LAB — CBC WITH DIFFERENTIAL/PLATELET
Abs Immature Granulocytes: 0.14 10*3/uL — ABNORMAL HIGH (ref 0.00–0.07)
Basophils Absolute: 0.1 10*3/uL (ref 0.0–0.1)
Basophils Relative: 1 %
Eosinophils Absolute: 0.1 10*3/uL (ref 0.0–0.5)
Eosinophils Relative: 2 %
HCT: 30.6 % — ABNORMAL LOW (ref 36.0–46.0)
Hemoglobin: 10.4 g/dL — ABNORMAL LOW (ref 12.0–15.0)
Immature Granulocytes: 2 %
Lymphocytes Relative: 16 %
Lymphs Abs: 1.3 10*3/uL (ref 0.7–4.0)
MCH: 33.8 pg (ref 26.0–34.0)
MCHC: 34 g/dL (ref 30.0–36.0)
MCV: 99.4 fL (ref 80.0–100.0)
Monocytes Absolute: 1.2 10*3/uL — ABNORMAL HIGH (ref 0.1–1.0)
Monocytes Relative: 15 %
Neutro Abs: 5.2 10*3/uL (ref 1.7–7.7)
Neutrophils Relative %: 64 %
Platelets: 318 10*3/uL (ref 150–400)
RBC: 3.08 MIL/uL — ABNORMAL LOW (ref 3.87–5.11)
RDW: 14.7 % (ref 11.5–15.5)
WBC: 8 10*3/uL (ref 4.0–10.5)
nRBC: 0.3 % — ABNORMAL HIGH (ref 0.0–0.2)

## 2021-07-30 LAB — MAGNESIUM: Magnesium: 1.8 mg/dL (ref 1.7–2.4)

## 2021-07-30 MED ORDER — SODIUM CHLORIDE 0.9% FLUSH
10.0000 mL | Freq: Once | INTRAVENOUS | Status: AC
  Administered 2021-07-30: 10 mL via INTRAVENOUS

## 2021-07-30 MED ORDER — SODIUM CHLORIDE 0.9 % IV SOLN: Freq: Once | INTRAVENOUS | Status: AC

## 2021-07-30 MED ORDER — HEPARIN SOD (PORK) LOCK FLUSH 100 UNIT/ML IV SOLN
500.0000 [IU] | Freq: Once | INTRAVENOUS | Status: AC
  Administered 2021-07-30: 500 [IU] via INTRAVENOUS

## 2021-07-30 NOTE — Progress Notes (Signed)
Patient presents today for Taxotere/Cytoxan per providers orders.  Vital signs within parameters for treatment.  Labs pending.  Patient states that she doesn't feel well today.  Advised patient to discuss with Dr. Delton Coombes during office visit.  No treatment today per Dr. Delton Coombes.  Patient will receive one liter of normal saline over two hours and will return next week for last treatment.  Saline given today per MD orders.  Stable during infusion without adverse affects.  Vital signs stable.  No complaints at this time.  Discharge from clinic via wheelchair in stable condition.  Alert and oriented X 3.  Follow up with [facility] as scheduled.

## 2021-07-30 NOTE — Patient Instructions (Signed)
Samantha Mcfarland at [facility] Discharge Instructions   You were seen and examined today by Dr. Delton Coombes.  He reviewed your lab work which is normal/stable. Your sodium however continues to be low.  Your blood pressure is also low.  This can be an effect from the chemotherapy.  STOP taking lisinopril (blood pressure medication) until advised differently.  We will hold your treatment today and give you IV fluids to help with your blood pressure and sodium levels.  Return next week as scheduled for your last treatment.    Thank you for choosing [place] [facility] to provide your oncology and hematology care.  To afford each patient quality time with our provider, please arrive at least 15 minutes before your scheduled appointment time.   If you have a lab appointment with the [facility] please come in thru the Main Entrance and check in at the main information desk.  You need to re-schedule your appointment should you arrive 10 or more minutes late.  We strive to give you quality time with our providers, and arriving late affects you and other patients whose appointments are after yours.  Also, if you no show three or more times for appointments you may be dismissed from the clinic at the providers discretion.     Again, thank you for choosing [facility].  Our hope is that these requests will decrease the amount of time that you wait before being seen by our physicians.       _____________________________________________________________  Should you have questions after your visit to [facility], please contact our office at [phone] and follow the prompts.  Our office hours are 8:00 a.m. and 4:30 p.m. Monday - Friday.  Please note that voicemails left after 4:00 p.m. may not be returned until the following business day.  We are closed weekends and major holidays.  You do have access to a nurse 24-7, just call the  main number to the clinic [phone] and do not press any options, hold on the line and a nurse will answer the phone.    For prescription refill requests, have your pharmacy contact our office and allow 72 hours.    Due to Covid, you will need to wear a mask upon entering the hospital. If you do not have a mask, a mask will be given to you at the Main Entrance upon arrival. For doctor visits, patients may have 1 support person age 74 or older with them. For treatment visits, patients can not have anyone with them due to social distancing guidelines and our immunocompromised population.

## 2021-07-30 NOTE — Patient Instructions (Signed)
[facility]  Discharge Instructions: Thank you for choosing [facility] to provide your oncology and hematology care.  If you have a lab appointment with the [facility], please come in thru the Main Entrance and check in at the main information desk.  Wear comfortable clothing and clothing appropriate for easy access to any Portacath or PICC line.   We strive to give you quality time with your provider. You may need to reschedule your appointment if you arrive late (15 or more minutes).  Arriving late affects you and other patients whose appointments are after yours.  Also, if you miss three or more appointments without notifying the office, you may be dismissed from the clinic at the providers discretion.      For prescription refill requests, have your pharmacy contact our office and allow 72 hours for refills to be completed.    Today you received the following chemotherapy and/or immunotherapy agents Saline      To help prevent nausea and vomiting after your treatment, we encourage you to take your nausea medication as directed.  BELOW ARE SYMPTOMS THAT SHOULD BE REPORTED IMMEDIATELY: *FEVER GREATER THAN 100.4 F (38 C) OR HIGHER *CHILLS OR SWEATING *NAUSEA AND VOMITING THAT IS NOT CONTROLLED WITH YOUR NAUSEA MEDICATION *UNUSUAL SHORTNESS OF BREATH *UNUSUAL BRUISING OR BLEEDING *URINARY PROBLEMS (pain or burning when urinating, or frequent urination) *BOWEL PROBLEMS (unusual diarrhea, constipation, pain near the anus) TENDERNESS IN MOUTH AND THROAT WITH OR WITHOUT PRESENCE OF ULCERS (sore throat, sores in mouth, or a toothache) UNUSUAL RASH, SWELLING OR PAIN  UNUSUAL VAGINAL DISCHARGE OR ITCHING   Items with * indicate a potential emergency and should be followed up as soon as possible or go to the Emergency Department if any problems should occur.  Please show the CHEMOTHERAPY ALERT CARD or IMMUNOTHERAPY ALERT CARD at check-in to the Emergency  Department and triage nurse.  Should you have questions after your visit or need to cancel or reschedule your appointment, please contact [facility] [phone]  and follow the prompts.  Office hours are 8:00 a.m. to 4:30 p.m. Monday - Friday. Please note that voicemails left after 4:00 p.m. may not be returned until the following business day.  We are closed weekends and major holidays. You have access to a nurse at all times for urgent questions. Please call the main number to the clinic [phone] and follow the prompts.  For any non-urgent questions, you may also contact your provider using MyChart. We now offer e-Visits for anyone 52 and older to request care online for non-urgent symptoms. For details visit [email or web address].   Also download the MyChart app! Go to the app store, search "MyChart", open the app, select [facility], and log in with your MyChart username and password.  Due to Covid, a mask is required upon entering the hospital/clinic. If you do not have a mask, one will be given to you upon arrival. For doctor visits, patients may have 1 support person aged 16 or older with them. For treatment visits, patients cannot have anyone with them due to current Covid guidelines and our immunocompromised population.

## 2021-07-30 NOTE — Progress Notes (Signed)
Samantha Mcfarland, Samantha Mcfarland   CLINIC:  Medical Oncology/Hematology  PCP:  Samantha Mcfarland, [address] / [place] [address] [phone]   REASON FOR VISIT:  Follow-up for left breast cancer  PRIOR THERAPY: none  NGS Results: not done  CURRENT THERAPY: Taxotere and Cytoxan ever 3 weeks  BRIEF ONCOLOGIC HISTORY:  Oncology History  Breast cancer, left ([facility])  04/22/2021 Initial Diagnosis   Breast cancer, left ([facility])   04/22/2021 Cancer Staging   Staging form: Breast, AJCC 8th Edition - Clinical stage from 04/22/2021: Stage IA (cT1c, cN0, cM0, G2, ER+, PR-, HER2-) - Signed by Derek Jack, MD on 04/22/2021 Stage prefix: Initial diagnosis Nuclear grade: G2 Histologic grading system: 3 grade system HER2-IHC interpretation: Negative HER2-IHC value: Score 1+ Ki-67 (%): 25    05/28/2021 -  Chemotherapy   Patient is on Treatment Plan : BREAST TC q21d      Genetic Testing   Negative genetic testing. No pathogenic variants identified on the [facility] Multi-Cancer+RNA panel. The report date is 05/10/2021.  The Multi-Cancer Panel + RNA offered by [facility] includes sequencing and/or deletion duplication testing of the following 84 genes: AIP, ALK, APC, ATM, AXIN2,BAP1,  BARD1, BLM, BMPR1A, BRCA1, BRCA2, BRIP1, CASR, CDC73, CDH1, CDK4, CDKN1B, CDKN1C, CDKN2A (p14ARF), CDKN2A (p16INK4a), CEBPA, CHEK2, CTNNA1, DICER1, DIS3L2, EGFR (c.2369C>T, p.Thr790Met variant only), EPCAM (Deletion/duplication testing only), FH, FLCN, GATA2, GPC3, GREM1 (Promoter region deletion/duplication testing only), HOXB13 (c.251G>A, p.Gly84Glu), HRAS, KIT, MAX, MEN1, MET, MITF (c.952G>A, p.Glu318Lys variant only), MLH1, MSH2, MSH3, MSH6, MUTYH, NBN, NF1, NF2, NTHL1, PALB2, PDGFRA, PHOX2B, PMS2, POLD1, POLE, POT1, PRKAR1A, PTCH1, PTEN, RAD50, RAD51C, RAD51D, RB1, RECQL4, RET, RUNX1, SDHAF2, SDHA (sequence changes only), SDHB, SDHC, SDHD, SMAD4, SMARCA4, SMARCB1,  SMARCE1, STK11, SUFU, TERC, TERT, TMEM127, TP53, TSC1, TSC2, VHL, WRN and WT1.     CANCER STAGING:  Cancer Staging  Breast cancer, left [facility]) Staging form: Breast, AJCC 8th Edition - Clinical stage from 04/22/2021: Stage IA (cT1c, cN0, cM0, G2, ER+, PR-, HER2-) - Signed by Derek Jack, MD on 04/22/2021   INTERVAL HISTORY:  [name], a 66 y.o. adult, returns for routine follow-up and consideration for next cycle of chemotherapy. Samantha Mcfarland was last seen on 07/09/2021.  Due for cycle #4 of Taxotere and Cytoxan today.   Overall, she tells me she has been feeling pretty well. She reports light-headedness. She denies any falls. She reports she continues to drink beer. She's lost 7 lbs since 01/19, and she reports her appetite has decreased. She reports acid reflux for 1 week following treatment for which she takes Prilosec. She is taking lisinopril. She reports SOB since her last treatment which has improved this week. She reports continued leg pains. She reports occasional tingling in her fingers, and she denies leg swellings.   Overall, she feels ready for next cycle of chemo today.   REVIEW OF SYSTEMS:  Review of Systems  Constitutional:  Positive for appetite change, fatigue and unexpected weight change (-7 lbs).  Respiratory:  Positive for shortness of breath.   Cardiovascular:  Positive for chest pain (5/10 sternum). Negative for leg swelling.  Gastrointestinal:  Positive for abdominal pain (5/10), constipation, diarrhea, nausea and vomiting.  Musculoskeletal:  Positive for arthralgias (legs).  Neurological:  Positive for dizziness, headaches, light-headedness and numbness (fingers).  Psychiatric/Behavioral:  Positive for depression and sleep disturbance.   All other systems reviewed and are negative.  PAST MEDICAL/SURGICAL HISTORY:  Past Medical History:  Diagnosis  Date   Allergy    Anxiety    Breast cancer ([facility])    Depression    Family history of breast cancer     Family history of colon cancer    Family history of pancreatic cancer    Family history of thyroid cancer    GERD (gastroesophageal reflux disease)    Hyperlipidemia    Hypertension    Osteopenia 04/30/2021   Port-A-Cath in place 05/07/2021   Vitamin D insufficiency    Past Surgical History:  Procedure Laterality Date   EXCISION OF SKIN TAG Right 05/27/2021   Procedure: EXCISION OF RIGHT SKIN TAG;  Surgeon: Virl Cagey, MD;  Location: [facility];  Service: General;  Laterality: Right;   MASTECTOMY W/ SENTINEL NODE BIOPSY Left 03/23/2021   Procedure: TOTAL MASTECTOMY WITH SENTINEL LYMPH NODE BIOPSY;  Surgeon: Virl Cagey, MD;  Location: [facility];  Service: General;  Laterality: Left;   NECK SURGERY     OOPHORECTOMY     PORTACATH PLACEMENT Right 05/27/2021   Procedure: INSERTION PORT-A-CATH;  Surgeon: Virl Cagey, MD;  Location: [facility];  Service: General;  Laterality: Right;   SALPINGECTOMY      SOCIAL HISTORY:  Social History   Socioeconomic History   Marital status: Divorced    Spouse name: Not on file   Number of children: 0   Years of education: Not on file   Highest education level: Not on file  Occupational History   Occupation: retired  Tobacco Use   Smoking status: Former    Packs/day: 0.25    Types: Cigarettes   Smokeless tobacco: Never   Tobacco comments:    1 pack every 3-4 days  Vaping Use   Vaping Use: Never used  Substance and Sexual Activity   Alcohol use: Yes    Alcohol/week: 20.0 standard drinks    Types: 20 Cans of beer per week    Comment: 3-4 beers per day   Drug use: Never   Sexual activity: Not Currently    Birth control/protection: Post-menopausal  Other Topics Concern   Not on file  Social History Narrative   Sister lives about 15 minutes away   Social Determinants of Health   Financial Resource Strain: Low Risk    Difficulty of Paying Living Expenses: Not hard at all  Food Insecurity: No Food Insecurity   Worried About  Charity fundraiser in the Last Year: Never true   Arboriculturist in the Last Year: Never true  Transportation Needs: No Transportation Needs   Lack of Transportation (Medical): No   Lack of Transportation (Non-Medical): No  Physical Activity: Insufficiently Active   Days of Exercise per Week: 5 days   Minutes of Exercise per Session: 20 min  Stress: Stress Concern Present   Feeling of Stress : To some extent  Social Connections: Socially Isolated   Frequency of Communication with Friends and Family: More than three times a week   Frequency of Social Gatherings with Friends and Family: More than three times a week   Attends Religious Services: Never   Marine scientist or Organizations: No   Attends Music therapist: Never   Marital Status: Divorced  Human resources officer Violence: Not At Risk   Fear of Current or Ex-Partner: No   Emotionally Abused: No   Physically Abused: No   Sexually Abused: No    FAMILY HISTORY:  Family History  Problem Relation Age of Onset   Stroke Mother  Alcohol abuse Mother    Diabetes Father    Pancreatic cancer Father 64   Testicular cancer Father        dx 30s   Pancreatic disease Father    Breast cancer Maternal Aunt        dx 50s-60s   Breast cancer Paternal Aunt 13   Thyroid cancer Paternal Uncle    Breast cancer Maternal Grandmother        dx 9s   Colon cancer Paternal Grandfather        dx 58s    CURRENT MEDICATIONS:  Current Outpatient Medications  Medication Sig Dispense Refill   acetaminophen (TYLENOL) 325 MG tablet Take 325 mg by mouth every 6 (six) hours as needed for moderate pain or headache.     atorvastatin (LIPITOR) 20 MG tablet Take 1 tablet (20 mg total) by mouth daily. 30 tablet 2   CYCLOPHOSPHAMIDE IV Inject into the vein every 21 ( twenty-one) days.     DOCEtaxel (TAXOTERE IV) Inject into the vein every 21 ( twenty-one) days.     FLUoxetine (PROZAC) 20 MG capsule Take 1 capsule (20 mg total) by mouth  daily. 30 capsule 2   lidocaine-prilocaine (EMLA) cream Apply a small amount to port a cath site (do not rub in) and cover with plastic wrap 1 hour prior to chemotherapy appointments 30 g 3   lisinopril (ZESTRIL) 10 MG tablet Take 1 tablet (10 mg total) by mouth daily with lunch. 30 tablet 2   omeprazole (PRILOSEC) 20 MG capsule Take 1 capsule (20 mg total) by mouth daily as needed (indigestion/heartburn). (Patient taking differently: Take 20 mg by mouth daily.) 30 capsule 2   oxyCODONE (OXY IR/ROXICODONE) 5 MG immediate release tablet TAKE ONE TABLET BY MOUTH EVERY 8 HOURS AS NEEDED FOR SEVERE PAIN OR BREAKTHROUGH PAIN 30 tablet 0   prochlorperazine (COMPAZINE) 10 MG tablet TAKE ONE TABLET EVERY 6 HOURS AS NEEDED 30 tablet 1   traMADol (ULTRAM) 50 MG tablet Take 1 tablet (50 mg total) by mouth every 6 (six) hours as needed for severe pain. 30 tablet 0   vitamin B-12 (CYANOCOBALAMIN) 500 MCG tablet Take 500 mcg by mouth daily.     No current facility-administered medications for this visit.    ALLERGIES:  Allergies  Allergen Reactions   Ibuprofen Other (See Comments)    "Stomach upset"   Penicillins     Allergic as a child. (Rash ???) Does not recall what type of reaction she has to drug.    PHYSICAL EXAM:  Performance status (ECOG): 1 - Symptomatic but completely ambulatory  There were no vitals filed for this visit. Wt Readings from Last 3 Encounters:  07/30/21 124 lb (56.2 kg)  07/09/21 131 lb (59.4 kg)  06/18/21 126 lb 1.7 oz (57.2 kg)   Physical Exam Vitals reviewed.  Constitutional:      Appearance: Normal appearance.  Cardiovascular:     Rate and Rhythm: Normal rate and regular rhythm.     Pulses: Normal pulses.     Heart sounds: Normal heart sounds.  Pulmonary:     Effort: Pulmonary effort is normal.     Breath sounds: Normal breath sounds.  Neurological:     General: No focal deficit present.     Mental Status: She is alert and oriented to person, place, and time.   Psychiatric:        Mood and Affect: Mood normal.        Behavior: Behavior normal.  LABORATORY DATA:  I have reviewed the labs as listed.  CBC Latest Ref Rng & Units 07/30/2021 07/09/2021 07/02/2021  WBC 4.0 - 10.5 K/uL 8.0 6.9 27.6(H)  Hemoglobin 12.0 - 15.0 g/dL 10.4(L) 10.1(L) 10.1(L)  Hematocrit 36.0 - 46.0 % 30.6(L) 29.9(L) 30.7(L)  Platelets 150 - 400 K/uL 318 368 289   CMP Latest Ref Rng & Units 07/30/2021 07/09/2021 07/02/2021  Glucose 70 - 99 mg/dL 90 77 82  BUN 8 - 23 mg/dL 6(L) 10 7(L)  Creatinine 0.44 - 1.00 mg/dL 0.66 0.64 0.62  Sodium 135 - 145 mmol/L 128(L) 132(L) 131(L)  Potassium 3.5 - 5.1 mmol/L 4.3 4.2 4.1  Chloride 98 - 111 mmol/L 97(L) 100 98  CO2 22 - 32 mmol/L $RemoveB'23 25 27  'ntTdTpmF$ Calcium 8.9 - 10.3 mg/dL 8.8(L) 8.7(L) 8.7(L)  Total Protein 6.5 - 8.1 g/dL 6.3(L) 6.4(L) 7.1  Total Bilirubin 0.3 - 1.2 mg/dL 0.5 0.5 0.2(L)  Alkaline Phos 38 - 126 U/L 69 63 98  AST 15 - 41 U/L $Remo'28 22 23  'yYxeU$ ALT 0 - 44 U/L $Remo'14 12 13    'QwvhH$ DIAGNOSTIC IMAGING:  I have independently reviewed the scans and discussed with the patient. No results found.   ASSESSMENT:  Stage I (T1CN0) left breast IDC, ER positive, HER2/PR negative: - Abnormal mammogram followed by left breast subareolar mass biopsy on 02/24/2021 consistent with DCIS intermediate to high-grade - Left mastectomy and SLNB on 03/23/2021 - Pathology shows 1.5 cm IDC, grade 2, extensive DCIS, margins negative, 0/2 left axillary lymph nodes, PT1CN0.  ER 100%.  PR-negative.  HER2 1+.  Ki-67-25%. - Oncotype DX showed recurrence score 33.  There is more than 15% chemotherapy benefit.  Distant recurrence at 9 years with tamoxifen alone was 21%. - Cycle 1 of dose attenuated TC on 05/28/2021. - [facility] genetic testing negative.   Social/family history: - She lives by herself.  She is seen with her sister today. 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 - She quit smoking 1 month ago.  Half pack per day for 35 years. - She  drinks alcohol 3-5 beers daily. - Father had prostate and pancreatic cancer.  Maternal grandmother and maternal aunt had breast cancer.  Paternal grandfather had colon cancer.  Other people in her family also had cancers, type unknown to the patient.   PLAN:  Stage I (T1CN0) left breast IDC, ER positive, HER2/PR negative, Oncotype DX 33: - She has tolerated cycle 3 reasonably well.  No major GI side effects. - However she lost about 7 pounds in the last 3 weeks. - She reportedly drank 3 beers last night with her grandson.  I have counseled her to not to drink alcohol during chemotherapy. - Denies any tingling or numbness in the extremities. - Reviewed labs today which showed CBC was grossly normal.  LFTs were normal.  Sodium is low at 128. - She is also complaining of dizziness.  Blood pressure is 90/60, repeat blood pressure 104/64. - Recommend holding chemotherapy today.  She will receive 1 L of normal saline IV over 1 hour.  Will hold lisinopril. - She will come back next week with labs and treatment.  RTC 4 weeks for follow-up.  We will consider starting aromatase inhibitor at that time.  2.  Bone health (osteopenia): - DEXA scan on 04/30/2021 with T score -1.5. - Continue calcium and vitamin D supplements.  3.  Taxane induced leg pains: - Continue oxycodone 5 mg 1 to 2 tablets daily.  4.  Hypotension: - Blood pressure is systolic 4129 and she is symptomatic with dizziness.  Hold lisinopril until further notice.   Orders placed this encounter:  No orders of the defined types were placed in this encounter.    Derek Jack, MD [facility] [phone]   I, Thana Ates, am acting as a scribe for Dr. Derek Jack.  I, Derek Jack MD, have reviewed the above documentation for accuracy and completeness, and I agree with the above.

## 2021-07-31 ENCOUNTER — Ambulatory Visit (HOSPITAL_COMMUNITY): Payer: Medicare HMO

## 2021-08-06 ENCOUNTER — Inpatient Hospital Stay (HOSPITAL_COMMUNITY): Payer: Medicare HMO

## 2021-08-06 ENCOUNTER — Encounter (HOSPITAL_COMMUNITY): Payer: Self-pay

## 2021-08-06 ENCOUNTER — Other Ambulatory Visit: Payer: Self-pay

## 2021-08-06 VITALS — BP 117/73 | HR 56 | Temp 97.7°F | Resp 18

## 2021-08-06 DIAGNOSIS — R0602 Shortness of breath: Secondary | ICD-10-CM | POA: Diagnosis not present

## 2021-08-06 DIAGNOSIS — Z170 Estrogen receptor positive status [ER+]: Secondary | ICD-10-CM | POA: Diagnosis not present

## 2021-08-06 DIAGNOSIS — R634 Abnormal weight loss: Secondary | ICD-10-CM | POA: Diagnosis not present

## 2021-08-06 DIAGNOSIS — R5383 Other fatigue: Secondary | ICD-10-CM | POA: Diagnosis not present

## 2021-08-06 DIAGNOSIS — Z5111 Encounter for antineoplastic chemotherapy: Secondary | ICD-10-CM | POA: Diagnosis not present

## 2021-08-06 DIAGNOSIS — R630 Anorexia: Secondary | ICD-10-CM | POA: Diagnosis not present

## 2021-08-06 DIAGNOSIS — C50912 Malignant neoplasm of unspecified site of left female breast: Secondary | ICD-10-CM

## 2021-08-06 DIAGNOSIS — Z5189 Encounter for other specified aftercare: Secondary | ICD-10-CM | POA: Diagnosis not present

## 2021-08-06 DIAGNOSIS — Z95828 Presence of other vascular implants and grafts: Secondary | ICD-10-CM

## 2021-08-06 DIAGNOSIS — R109 Unspecified abdominal pain: Secondary | ICD-10-CM | POA: Diagnosis not present

## 2021-08-06 DIAGNOSIS — I959 Hypotension, unspecified: Secondary | ICD-10-CM | POA: Diagnosis not present

## 2021-08-06 LAB — CBC WITH DIFFERENTIAL/PLATELET
Abs Immature Granulocytes: 0.02 10*3/uL (ref 0.00–0.07)
Basophils Absolute: 0 10*3/uL (ref 0.0–0.1)
Basophils Relative: 1 %
Eosinophils Absolute: 0.4 10*3/uL (ref 0.0–0.5)
Eosinophils Relative: 7 %
HCT: 30.2 % — ABNORMAL LOW (ref 36.0–46.0)
Hemoglobin: 10.2 g/dL — ABNORMAL LOW (ref 12.0–15.0)
Immature Granulocytes: 0 %
Lymphocytes Relative: 19 %
Lymphs Abs: 1.1 10*3/uL (ref 0.7–4.0)
MCH: 33.9 pg (ref 26.0–34.0)
MCHC: 33.8 g/dL (ref 30.0–36.0)
MCV: 100.3 fL — ABNORMAL HIGH (ref 80.0–100.0)
Monocytes Absolute: 0.7 10*3/uL (ref 0.1–1.0)
Monocytes Relative: 12 %
Neutro Abs: 3.7 10*3/uL (ref 1.7–7.7)
Neutrophils Relative %: 61 %
Platelets: 279 10*3/uL (ref 150–400)
RBC: 3.01 MIL/uL — ABNORMAL LOW (ref 3.87–5.11)
RDW: 15.2 % (ref 11.5–15.5)
WBC: 5.9 10*3/uL (ref 4.0–10.5)
nRBC: 0 % (ref 0.0–0.2)

## 2021-08-06 LAB — COMPREHENSIVE METABOLIC PANEL
ALT: 12 U/L (ref 0–44)
AST: 21 U/L (ref 15–41)
Albumin: 3.8 g/dL (ref 3.5–5.0)
Alkaline Phosphatase: 52 U/L (ref 38–126)
Anion gap: 10 (ref 5–15)
BUN: 9 mg/dL (ref 8–23)
CO2: 22 mmol/L (ref 22–32)
Calcium: 8.8 mg/dL — ABNORMAL LOW (ref 8.9–10.3)
Chloride: 98 mmol/L (ref 98–111)
Creatinine, Ser: 0.54 mg/dL (ref 0.44–1.00)
GFR, Estimated: >60 mL/min (ref >60)
Glucose, Bld: 80 mg/dL (ref 70–99)
Potassium: 4 mmol/L (ref 3.5–5.1)
Sodium: 130 mmol/L — ABNORMAL LOW (ref 135–145)
Total Bilirubin: 0.2 mg/dL — ABNORMAL LOW (ref 0.3–1.2)
Total Protein: 6.3 g/dL — ABNORMAL LOW (ref 6.5–8.1)

## 2021-08-06 LAB — MAGNESIUM: Magnesium: 2 mg/dL (ref 1.7–2.4)

## 2021-08-06 MED ORDER — HEPARIN SOD (PORK) LOCK FLUSH 100 UNIT/ML IV SOLN
500.0000 [IU] | Freq: Once | INTRAVENOUS | Status: AC | PRN
  Administered 2021-08-06: 500 [IU]

## 2021-08-06 MED ORDER — SODIUM CHLORIDE 0.9 % IV SOLN
480.0000 mg/m2 | Freq: Once | INTRAVENOUS | Status: AC
  Administered 2021-08-06: 780 mg via INTRAVENOUS
  Filled 2021-08-06: qty 39

## 2021-08-06 MED ORDER — SODIUM CHLORIDE 0.9 % IV SOLN
60.0000 mg/m2 | Freq: Once | INTRAVENOUS | Status: AC
  Administered 2021-08-06: 100 mg via INTRAVENOUS
  Filled 2021-08-06: qty 10

## 2021-08-06 MED ORDER — PALONOSETRON HCL INJECTION 0.25 MG/5ML
0.2500 mg | Freq: Once | INTRAVENOUS | Status: AC
  Administered 2021-08-06: 0.25 mg via INTRAVENOUS
  Filled 2021-08-06: qty 5

## 2021-08-06 MED ORDER — SODIUM CHLORIDE 0.9 % IV SOLN
10.0000 mg | Freq: Once | INTRAVENOUS | Status: AC
  Administered 2021-08-06: 10 mg via INTRAVENOUS
  Filled 2021-08-06: qty 10

## 2021-08-06 MED ORDER — SODIUM CHLORIDE 0.9% FLUSH
10.0000 mL | INTRAVENOUS | Status: DC | PRN
  Administered 2021-08-06: 10 mL

## 2021-08-06 MED ORDER — SODIUM CHLORIDE 0.9 % IV SOLN: Freq: Once | INTRAVENOUS | Status: AC

## 2021-08-06 NOTE — Patient Instructions (Signed)
[facility]  Discharge Instructions: Thank you for choosing [facility] to provide your oncology and hematology care.  If you have a lab appointment with the [facility], please come in thru the Main Entrance and check in at the main information desk.  Wear comfortable clothing and clothing appropriate for easy access to any Portacath or PICC line.   We strive to give you quality time with your provider. You may need to reschedule your appointment if you arrive late (15 or more minutes).  Arriving late affects you and other patients whose appointments are after yours.  Also, if you miss three or more appointments without notifying the office, you may be dismissed from the clinic at the providers discretion.      For prescription refill requests, have your pharmacy contact our office and allow 72 hours for refills to be completed.    Today you received the following chemotherapy and/or immunotherapy agents Taxol/Cytoxan, return as scheduled.   To help prevent nausea and vomiting after your treatment, we encourage you to take your nausea medication as directed.  BELOW ARE SYMPTOMS THAT SHOULD BE REPORTED IMMEDIATELY: *FEVER GREATER THAN 100.4 F (38 C) OR HIGHER *CHILLS OR SWEATING *NAUSEA AND VOMITING THAT IS NOT CONTROLLED WITH YOUR NAUSEA MEDICATION *UNUSUAL SHORTNESS OF BREATH *UNUSUAL BRUISING OR BLEEDING *URINARY PROBLEMS (pain or burning when urinating, or frequent urination) *BOWEL PROBLEMS (unusual diarrhea, constipation, pain near the anus) TENDERNESS IN MOUTH AND THROAT WITH OR WITHOUT PRESENCE OF ULCERS (sore throat, sores in mouth, or a toothache) UNUSUAL RASH, SWELLING OR PAIN  UNUSUAL VAGINAL DISCHARGE OR ITCHING   Items with * indicate a potential emergency and should be followed up as soon as possible or go to the Emergency Department if any problems should occur.  Please show the CHEMOTHERAPY ALERT CARD or IMMUNOTHERAPY ALERT CARD at check-in  to the Emergency Department and triage nurse.  Should you have questions after your visit or need to cancel or reschedule your appointment, please contact [facility] [phone]  and follow the prompts.  Office hours are 8:00 a.m. to 4:30 p.m. Monday - Friday. Please note that voicemails left after 4:00 p.m. may not be returned until the following business day.  We are closed weekends and major holidays. You have access to a nurse at all times for urgent questions. Please call the main number to the clinic [phone] and follow the prompts.  For any non-urgent questions, you may also contact your provider using MyChart. We now offer e-Visits for anyone 66 and older to request care online for non-urgent symptoms. For details visit [email or web address].   Also download the MyChart app! Go to the app store, search "MyChart", open the app, select [facility], and log in with your MyChart username and password.  Due to Covid, a mask is required upon entering the hospital/clinic. If you do not have a mask, one will be given to you upon arrival. For doctor visits, patients may have 1 support person aged 66 or older with them. For treatment visits, patients cannot have anyone with them due to current Covid guidelines and our immunocompromised population.

## 2021-08-06 NOTE — Progress Notes (Signed)
Patient tolerated chemotherapy with no complaints voiced. Side effects with management reviewed understanding verbalized. Port site clean and dry with no bruising or swelling noted at site. Good blood return noted before and after administration of chemotherapy. Band aid applied. Patient left in satisfactory condition with VSS and no s/s of distress noted. 

## 2021-08-07 ENCOUNTER — Inpatient Hospital Stay (HOSPITAL_COMMUNITY): Payer: Medicare HMO

## 2021-08-07 VITALS — BP 108/62 | HR 63 | Temp 98.6°F | Resp 16

## 2021-08-07 DIAGNOSIS — Z95828 Presence of other vascular implants and grafts: Secondary | ICD-10-CM

## 2021-08-07 DIAGNOSIS — R109 Unspecified abdominal pain: Secondary | ICD-10-CM | POA: Diagnosis not present

## 2021-08-07 DIAGNOSIS — C50912 Malignant neoplasm of unspecified site of left female breast: Secondary | ICD-10-CM

## 2021-08-07 DIAGNOSIS — I959 Hypotension, unspecified: Secondary | ICD-10-CM | POA: Diagnosis not present

## 2021-08-07 DIAGNOSIS — Z5189 Encounter for other specified aftercare: Secondary | ICD-10-CM | POA: Diagnosis not present

## 2021-08-07 DIAGNOSIS — Z5111 Encounter for antineoplastic chemotherapy: Secondary | ICD-10-CM | POA: Diagnosis not present

## 2021-08-07 DIAGNOSIS — Z170 Estrogen receptor positive status [ER+]: Secondary | ICD-10-CM

## 2021-08-07 DIAGNOSIS — R630 Anorexia: Secondary | ICD-10-CM | POA: Diagnosis not present

## 2021-08-07 DIAGNOSIS — R634 Abnormal weight loss: Secondary | ICD-10-CM | POA: Diagnosis not present

## 2021-08-07 DIAGNOSIS — R0602 Shortness of breath: Secondary | ICD-10-CM | POA: Diagnosis not present

## 2021-08-07 DIAGNOSIS — R5383 Other fatigue: Secondary | ICD-10-CM | POA: Diagnosis not present

## 2021-08-07 MED ORDER — PEGFILGRASTIM-CBQV 6 MG/0.6ML ~~LOC~~ SOSY
6.0000 mg | PREFILLED_SYRINGE | Freq: Once | SUBCUTANEOUS | Status: AC
  Administered 2021-08-07: 6 mg via SUBCUTANEOUS
  Filled 2021-08-07: qty 0.6

## 2021-08-07 NOTE — Patient Instructions (Signed)
[facility]  Discharge Instructions: Thank you for choosing [facility] to provide your oncology and hematology care.  If you have a lab appointment with the Cancer Center, please come in thru the Main Entrance and check in at the main information desk.  Wear comfortable clothing and clothing appropriate for easy access to any Portacath or PICC line.   We strive to give you quality time with your provider. You may need to reschedule your appointment if you arrive late (15 or more minutes).  Arriving late affects you and other patients whose appointments are after yours.  Also, if you miss three or more appointments without notifying the office, you may be dismissed from the clinic at the provider's discretion.      For prescription refill requests, have your pharmacy contact our office and allow 72 hours for refills to be completed.        To help prevent nausea and vomiting after your treatment, we encourage you to take your nausea medication as directed.  BELOW ARE SYMPTOMS THAT SHOULD BE REPORTED IMMEDIATELY: *FEVER GREATER THAN 100.4 F (38 C) OR HIGHER *CHILLS OR SWEATING *NAUSEA AND VOMITING THAT IS NOT CONTROLLED WITH YOUR NAUSEA MEDICATION *UNUSUAL SHORTNESS OF BREATH *UNUSUAL BRUISING OR BLEEDING *URINARY PROBLEMS (pain or burning when urinating, or frequent urination) *BOWEL PROBLEMS (unusual diarrhea, constipation, pain near the anus) TENDERNESS IN MOUTH AND THROAT WITH OR WITHOUT PRESENCE OF ULCERS (sore throat, sores in mouth, or a toothache) UNUSUAL RASH, SWELLING OR PAIN  UNUSUAL VAGINAL DISCHARGE OR ITCHING   Items with * indicate a potential emergency and should be followed up as soon as possible or go to the Emergency Department if any problems should occur.  Please show the CHEMOTHERAPY ALERT CARD or IMMUNOTHERAPY ALERT CARD at check-in to the Emergency Department and triage nurse.  Should you have questions after your visit or need to cancel  or reschedule your appointment, please contact [facility] [phone]  and follow the prompts.  Office hours are 8:00 a.m. to 4:30 p.m. Monday - Friday. Please note that voicemails left after 4:00 p.m. may not be returned until the following business day.  We are closed weekends and major holidays. You have access to a nurse at all times for urgent questions. Please call the main number to the clinic [phone] and follow the prompts.  For any non-urgent questions, you may also contact your provider using MyChart. We now offer e-Visits for anyone 18 and older to request care online for non-urgent symptoms. For details visit [email or web address].   Also download the MyChart app! Go to the app store, search "MyChart", open the app, select [facility], and log in with your MyChart username and password.  Due to Covid, a mask is required upon entering the hospital/clinic. If you do not have a mask, one will be given to you upon arrival. For doctor visits, patients may have 1 support person aged 18 or older with them. For treatment visits, patients cannot have anyone with them due to current Covid guidelines and our immunocompromised population.  

## 2021-08-07 NOTE — Progress Notes (Signed)
Udenyca injection given per MD. Patient tolerated it well without problems. Vitals stable and discharged home from clinic ambulatory. Follow up as scheduled.

## 2021-08-10 ENCOUNTER — Other Ambulatory Visit: Payer: Self-pay | Admitting: Family Medicine

## 2021-08-10 ENCOUNTER — Other Ambulatory Visit (HOSPITAL_COMMUNITY): Payer: Self-pay | Admitting: Hematology

## 2021-08-10 DIAGNOSIS — Z170 Estrogen receptor positive status [ER+]: Secondary | ICD-10-CM

## 2021-08-10 DIAGNOSIS — E782 Mixed hyperlipidemia: Secondary | ICD-10-CM

## 2021-08-10 DIAGNOSIS — C50912 Malignant neoplasm of unspecified site of left female breast: Secondary | ICD-10-CM

## 2021-08-13 ENCOUNTER — Other Ambulatory Visit: Payer: Self-pay | Admitting: Family Medicine

## 2021-08-13 DIAGNOSIS — I10 Essential (primary) hypertension: Secondary | ICD-10-CM

## 2021-08-20 ENCOUNTER — Encounter (HOSPITAL_COMMUNITY): Payer: Self-pay | Admitting: Hematology

## 2021-08-24 ENCOUNTER — Other Ambulatory Visit (HOSPITAL_COMMUNITY): Payer: Self-pay | Admitting: Hematology

## 2021-08-24 DIAGNOSIS — C50912 Malignant neoplasm of unspecified site of left female breast: Secondary | ICD-10-CM

## 2021-08-24 DIAGNOSIS — Z170 Estrogen receptor positive status [ER+]: Secondary | ICD-10-CM

## 2021-08-27 ENCOUNTER — Other Ambulatory Visit: Payer: Self-pay

## 2021-08-27 ENCOUNTER — Inpatient Hospital Stay (HOSPITAL_COMMUNITY): Payer: Medicare Other

## 2021-08-27 ENCOUNTER — Inpatient Hospital Stay (HOSPITAL_COMMUNITY): Payer: Medicare Other | Attending: Hematology | Admitting: Hematology

## 2021-08-27 ENCOUNTER — Encounter (HOSPITAL_COMMUNITY): Payer: Self-pay | Admitting: Hematology

## 2021-08-27 VITALS — Ht 64.76 in | Wt 125.2 lb

## 2021-08-27 DIAGNOSIS — Z803 Family history of malignant neoplasm of breast: Secondary | ICD-10-CM | POA: Insufficient documentation

## 2021-08-27 DIAGNOSIS — Z8042 Family history of malignant neoplasm of prostate: Secondary | ICD-10-CM | POA: Diagnosis not present

## 2021-08-27 DIAGNOSIS — R519 Headache, unspecified: Secondary | ICD-10-CM | POA: Insufficient documentation

## 2021-08-27 DIAGNOSIS — C50912 Malignant neoplasm of unspecified site of left female breast: Secondary | ICD-10-CM | POA: Insufficient documentation

## 2021-08-27 DIAGNOSIS — M2550 Pain in unspecified joint: Secondary | ICD-10-CM | POA: Diagnosis not present

## 2021-08-27 DIAGNOSIS — Z79899 Other long term (current) drug therapy: Secondary | ICD-10-CM | POA: Diagnosis not present

## 2021-08-27 DIAGNOSIS — Z87891 Personal history of nicotine dependence: Secondary | ICD-10-CM | POA: Insufficient documentation

## 2021-08-27 DIAGNOSIS — Z170 Estrogen receptor positive status [ER+]: Secondary | ICD-10-CM | POA: Diagnosis not present

## 2021-08-27 DIAGNOSIS — Z800 Family history of malignant neoplasm of digestive organs: Secondary | ICD-10-CM | POA: Insufficient documentation

## 2021-08-27 DIAGNOSIS — M79606 Pain in leg, unspecified: Secondary | ICD-10-CM | POA: Diagnosis not present

## 2021-08-27 DIAGNOSIS — Z9221 Personal history of antineoplastic chemotherapy: Secondary | ICD-10-CM | POA: Insufficient documentation

## 2021-08-27 DIAGNOSIS — R110 Nausea: Secondary | ICD-10-CM | POA: Diagnosis not present

## 2021-08-27 DIAGNOSIS — R42 Dizziness and giddiness: Secondary | ICD-10-CM | POA: Insufficient documentation

## 2021-08-27 DIAGNOSIS — M8580 Other specified disorders of bone density and structure, unspecified site: Secondary | ICD-10-CM | POA: Insufficient documentation

## 2021-08-27 DIAGNOSIS — R0602 Shortness of breath: Secondary | ICD-10-CM | POA: Insufficient documentation

## 2021-08-27 DIAGNOSIS — I959 Hypotension, unspecified: Secondary | ICD-10-CM | POA: Diagnosis not present

## 2021-08-27 DIAGNOSIS — Z79811 Long term (current) use of aromatase inhibitors: Secondary | ICD-10-CM | POA: Insufficient documentation

## 2021-08-27 DIAGNOSIS — R5383 Other fatigue: Secondary | ICD-10-CM | POA: Diagnosis not present

## 2021-08-27 DIAGNOSIS — F32A Depression, unspecified: Secondary | ICD-10-CM | POA: Insufficient documentation

## 2021-08-27 DIAGNOSIS — Z9012 Acquired absence of left breast and nipple: Secondary | ICD-10-CM | POA: Insufficient documentation

## 2021-08-27 DIAGNOSIS — D649 Anemia, unspecified: Secondary | ICD-10-CM | POA: Insufficient documentation

## 2021-08-27 DIAGNOSIS — G479 Sleep disorder, unspecified: Secondary | ICD-10-CM | POA: Diagnosis not present

## 2021-08-27 DIAGNOSIS — K5900 Constipation, unspecified: Secondary | ICD-10-CM | POA: Diagnosis not present

## 2021-08-27 DIAGNOSIS — E871 Hypo-osmolality and hyponatremia: Secondary | ICD-10-CM | POA: Diagnosis not present

## 2021-08-27 LAB — COMPREHENSIVE METABOLIC PANEL
ALT: 13 U/L (ref 0–44)
AST: 25 U/L (ref 15–41)
Albumin: 4.1 g/dL (ref 3.5–5.0)
Alkaline Phosphatase: 59 U/L (ref 38–126)
Anion gap: 8 (ref 5–15)
BUN: 7 mg/dL — ABNORMAL LOW (ref 8–23)
CO2: 24 mmol/L (ref 22–32)
Calcium: 9 mg/dL (ref 8.9–10.3)
Chloride: 96 mmol/L — ABNORMAL LOW (ref 98–111)
Creatinine, Ser: 0.63 mg/dL (ref 0.44–1.00)
GFR, Estimated: >60 mL/min (ref >60)
Glucose, Bld: 92 mg/dL (ref 70–99)
Potassium: 4 mmol/L (ref 3.5–5.1)
Sodium: 128 mmol/L — ABNORMAL LOW (ref 135–145)
Total Bilirubin: 0.8 mg/dL (ref 0.3–1.2)
Total Protein: 6.6 g/dL (ref 6.5–8.1)

## 2021-08-27 LAB — CBC WITH DIFFERENTIAL/PLATELET
Abs Immature Granulocytes: 0.04 10*3/uL (ref 0.00–0.07)
Basophils Absolute: 0 10*3/uL (ref 0.0–0.1)
Basophils Relative: 0 %
Eosinophils Absolute: 0 10*3/uL (ref 0.0–0.5)
Eosinophils Relative: 0 %
HCT: 31.2 % — ABNORMAL LOW (ref 36.0–46.0)
Hemoglobin: 10.7 g/dL — ABNORMAL LOW (ref 12.0–15.0)
Immature Granulocytes: 1 %
Lymphocytes Relative: 17 %
Lymphs Abs: 1.2 10*3/uL (ref 0.7–4.0)
MCH: 33.9 pg (ref 26.0–34.0)
MCHC: 34.3 g/dL (ref 30.0–36.0)
MCV: 98.7 fL (ref 80.0–100.0)
Monocytes Absolute: 0.9 10*3/uL (ref 0.1–1.0)
Monocytes Relative: 13 %
Neutro Abs: 4.9 10*3/uL (ref 1.7–7.7)
Neutrophils Relative %: 69 %
Platelets: 267 10*3/uL (ref 150–400)
RBC: 3.16 MIL/uL — ABNORMAL LOW (ref 3.87–5.11)
RDW: 15.5 % (ref 11.5–15.5)
WBC: 7.1 10*3/uL (ref 4.0–10.5)
nRBC: 0 % (ref 0.0–0.2)

## 2021-08-27 LAB — MAGNESIUM: Magnesium: 1.8 mg/dL (ref 1.7–2.4)

## 2021-08-27 MED ORDER — HEPARIN SOD (PORK) LOCK FLUSH 100 UNIT/ML IV SOLN
500.0000 [IU] | Freq: Once | INTRAVENOUS | Status: AC
  Administered 2021-08-27: 12:00:00 500 [IU] via INTRAVENOUS

## 2021-08-27 MED ORDER — GABAPENTIN 300 MG PO CAPS: 300.0000 mg | ORAL_CAPSULE | Freq: Two times a day (BID) | ORAL | 3 refills | Status: DC

## 2021-08-27 MED ORDER — ANASTROZOLE 1 MG PO TABS: 1.0000 mg | ORAL_TABLET | Freq: Every day | ORAL | 4 refills | Status: DC

## 2021-08-27 MED ORDER — SODIUM CHLORIDE 0.9% FLUSH
10.0000 mL | Freq: Once | INTRAVENOUS | Status: AC
  Administered 2021-08-27: 10:00:00 10 mL via INTRAVENOUS

## 2021-08-27 NOTE — Progress Notes (Signed)
[place] [address] [place] [address]   Patient Care Team: Loman Brooklyn, FNP as PCP - General (Family Medicine) Derek Jack, MD as Medical Oncologist (Medical Oncology) Brien Mates, RN as Oncology Nurse Navigator (Oncology)  SUMMARY OF ONCOLOGIC HISTORY: Oncology History  Breast cancer, left ([place])  04/22/2021 Initial Diagnosis   Breast cancer, left ([place])   04/22/2021 Cancer Staging   Staging form: Breast, AJCC 8th Edition - Clinical stage from 04/22/2021: Stage IA (cT1c, cN0, cM0, G2, ER+, PR-, HER2-) - Signed by Derek Jack, MD on 04/22/2021 Stage prefix: Initial diagnosis Nuclear grade: G2 Histologic grading system: 3 grade system HER2-IHC interpretation: Negative HER2-IHC value: Score 1+ Ki-67 (%): 25    05/28/2021 -  Chemotherapy   Patient is on Treatment Plan : BREAST TC q21d      Genetic Testing   Negative genetic testing. No pathogenic variants identified on the [facility] Multi-Cancer+RNA panel. The report date is 05/10/2021.  The Multi-Cancer Panel + RNA offered by [facility] includes sequencing and/or deletion duplication testing of the following 84 genes: AIP, ALK, APC, ATM, AXIN2,BAP1,  BARD1, BLM, BMPR1A, BRCA1, BRCA2, BRIP1, CASR, CDC73, CDH1, CDK4, CDKN1B, CDKN1C, CDKN2A (p14ARF), CDKN2A (p16INK4a), CEBPA, CHEK2, CTNNA1, DICER1, DIS3L2, EGFR (c.2369C>T, p.Thr790Met variant only), EPCAM (Deletion/duplication testing only), FH, FLCN, GATA2, GPC3, GREM1 (Promoter region deletion/duplication testing only), HOXB13 (c.251G>A, p.Gly84Glu), HRAS, KIT, MAX, MEN1, MET, MITF (c.952G>A, p.Glu318Lys variant only), MLH1, MSH2, MSH3, MSH6, MUTYH, NBN, NF1, NF2, NTHL1, PALB2, PDGFRA, PHOX2B, PMS2, POLD1, POLE, POT1, PRKAR1A, PTCH1, PTEN, RAD50, RAD51C, RAD51D, RB1, RECQL4, RET, RUNX1, SDHAF2, SDHA (sequence changes only), SDHB, SDHC, SDHD, SMAD4, SMARCA4, SMARCB1, SMARCE1, STK11, SUFU, TERC, TERT, TMEM127, TP53, TSC1, TSC2, VHL, WRN and  WT1.     CHIEF COMPLIANT: Follow-up for left breast cancer   INTERVAL HISTORY: [name] is a 66 y.o. adult here today for follow up of her left breast cancer. Her last visit was on 07/30/2021.   Today she reports feeling good. She has gained 1 lb since her last visit. She is not taking lisinopril. She reports yesterday she had intermittent shooting pains in the front of her head lasting for a few seconds. She denies numbness or pain in her feet, but she reports intermittent shooting pains in her legs.   REVIEW OF SYSTEMS:   Review of Systems  Constitutional:  Positive for fatigue. Negative for appetite change and unexpected weight change.  Respiratory:  Positive for shortness of breath.   Gastrointestinal:  Positive for constipation and nausea.  Musculoskeletal:  Positive for arthralgias (5/10 R leg shoulder).  Neurological:  Positive for dizziness and headaches.  Psychiatric/Behavioral:  Positive for depression and sleep disturbance.   All other systems reviewed and are negative.  I have reviewed the past medical history, past surgical history, social history and family history with the patient and they are unchanged from previous note.   ALLERGIES:   is allergic to ibuprofen and penicillins.   MEDICATIONS:  Current Outpatient Medications  Medication Sig Dispense Refill   acetaminophen (TYLENOL) 325 MG tablet Take 325 mg by mouth every 6 (six) hours as needed for moderate pain or headache.     anastrozole (ARIMIDEX) 1 MG tablet Take 1 tablet (1 mg total) by mouth daily. 90 tablet 4   atorvastatin (LIPITOR) 20 MG tablet TAKE ONE TABLET ONCE DAILY 30 tablet 2   CYCLOPHOSPHAMIDE IV Inject into the vein every 21 ( twenty-one) days.     DOCEtaxel (TAXOTERE IV) Inject  into the vein every 21 ( twenty-one) days.     gabapentin (NEURONTIN) 300 MG capsule Take 1 capsule (300 mg total) by mouth 2 (two) times daily. 60 capsule 3   oxyCODONE (OXY IR/ROXICODONE) 5 MG immediate release  tablet TAKE ONE TABLET BY MOUTH EVERY 8 HOURS AS NEEDED FOR SEVERE PAIN OR BREAKTHROUGH PAIN 30 tablet 0   vitamin B-12 (CYANOCOBALAMIN) 500 MCG tablet Take 500 mcg by mouth daily.     lidocaine-prilocaine (EMLA) cream Apply a small amount to port a cath site (do not rub in) and cover with plastic wrap 1 hour prior to chemotherapy appointments (Patient not taking: Reported on 08/27/2021) 30 g 3   lisinopril (ZESTRIL) 10 MG tablet TAKE 1 TABLET DAILY WITH LUNCH (Patient not taking: Reported on 08/27/2021) 30 tablet 2   omeprazole (PRILOSEC) 20 MG capsule Take 1 capsule (20 mg total) by mouth daily as needed (indigestion/heartburn). (Patient not taking: Reported on 08/27/2021) 30 capsule 2   prochlorperazine (COMPAZINE) 10 MG tablet TAKE ONE TABLET EVERY 6 HOURS AS NEEDED (Patient not taking: Reported on 08/27/2021) 30 tablet 1   No current facility-administered medications for this visit.   Facility-Administered Medications Ordered in Other Visits  Medication Dose Route Frequency Provider Last Rate Last Admin   heparin lock flush 100 unit/mL  500 Units Intravenous Once Derek Jack, MD       sodium chloride flush (NS) 0.9 % injection 10 mL  10 mL Intravenous Once Derek Jack, MD         PHYSICAL EXAMINATION: Performance status (ECOG): 1 - Symptomatic but completely ambulatory  There were no vitals filed for this visit. Wt Readings from Last 3 Encounters:  08/27/21 125 lb 3.2 oz (56.8 kg)  08/06/21 123 lb 7.3 oz (56 kg)  07/30/21 124 lb (56.2 kg)   Physical Exam Vitals reviewed.  Constitutional:      Appearance: Normal appearance.  Cardiovascular:     Rate and Rhythm: Normal rate and regular rhythm.     Pulses: Normal pulses.     Heart sounds: Normal heart sounds.  Pulmonary:     Effort: Pulmonary effort is normal.     Breath sounds: Normal breath sounds.  Musculoskeletal:     Right lower leg: No edema.     Left lower leg: No edema.  Neurological:     General: No focal  deficit present.     Mental Status: She is alert and oriented to person, place, and time.  Psychiatric:        Mood and Affect: Mood normal.        Behavior: Behavior normal.    Breast Exam Chaperone: Thana Ates     LABORATORY DATA:  I have reviewed the data as listed CMP Latest Ref Rng & Units 08/27/2021 08/06/2021 07/30/2021  Glucose 70 - 99 mg/dL 92 80 90  BUN 8 - 23 mg/dL 7(L) 9 6(L)  Creatinine 0.44 - 1.00 mg/dL 0.63 0.54 0.66  Sodium 135 - 145 mmol/L 128(L) 130(L) 128(L)  Potassium 3.5 - 5.1 mmol/L 4.0 4.0 4.3  Chloride 98 - 111 mmol/L 96(L) 98 97(L)  CO2 22 - 32 mmol/L $RemoveB'24 22 23  'eAdqqnBy$ Calcium 8.9 - 10.3 mg/dL 9.0 8.8(L) 8.8(L)  Total Protein 6.5 - 8.1 g/dL 6.6 6.3(L) 6.3(L)  Total Bilirubin 0.3 - 1.2 mg/dL 0.8 0.2(L) 0.5  Alkaline Phos 38 - 126 U/L 59 52 69  AST 15 - 41 U/L $Remo'25 21 28  'JYuUf$ ALT 0 - 44 U/L 13 12 14  No results found for: BSJ628 Lab Results  Component Value Date   WBC 7.1 08/27/2021   HGB 10.7 (L) 08/27/2021   HCT 31.2 (L) 08/27/2021   MCV 98.7 08/27/2021   PLT 267 08/27/2021   NEUTROABS 4.9 08/27/2021    ASSESSMENT:  Stage I (T1CN0) left breast IDC, ER positive, HER2/PR negative: - Abnormal mammogram followed by left breast subareolar mass biopsy on 02/24/2021 consistent with DCIS intermediate to high-grade - Left mastectomy and SLNB on 03/23/2021 - Pathology shows 1.5 cm IDC, grade 2, extensive DCIS, margins negative, 0/2 left axillary lymph nodes, PT1CN0.  ER 100%.  PR-negative.  HER2 1+.  Ki-67-25%. - Oncotype DX showed recurrence score 33.  There is more than 15% chemotherapy benefit.  Distant recurrence at 9 years with tamoxifen alone was 21%. - 4 cycles of TC from 05/28/2021 through 08/06/2021. - [facility] genetic testing negative. - Anastrozole started on 08/27/2021.   Social/family history: - She lives by herself.  She is seen with her sister today. 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 - She quit smoking 1 month ago.  Half pack  per day for 35 years. - She drinks alcohol 3-5 beers daily. - Father had prostate and pancreatic cancer.  Maternal grandmother and maternal aunt had breast cancer.  Paternal grandfather had colon cancer.  Other people in her family also had cancers, type unknown to the patient.   PLAN:  Stage I (T1CN0) left breast IDC, ER positive, HER2/PR negative, Oncotype DX 33: - She is recovering from chemotherapy.  She still has some fatigue. - We reviewed blood work from today which showed normal LFTs.  She has persistent hyponatremia which is stable.  CBC was also grossly normal with mild normocytic anemia which is improving. - At this time I have recommended antiestrogen therapy with anastrozole. - We discussed side effects in detail including hot flashes, decreased bone mineral density and musculoskeletal symptoms. - She is agreeable.  We will send a prescription. - She wants her port taken out.  We will make referral to Dr. Constance Haw. - RTC 3 months for follow-up with repeat labs.  2.  Bone health (osteopenia): - DEXA scan on 04/30/2021 with T score -1.5. - Continue calcium and vitamin D supplements.  3.  Taxane induced leg pains: - I have told her to cut back on oxycodone 5 mg to bedtime. - We will start her on gabapentin 300 mg twice daily for shooting pains in the legs.  4.  Hypotension: - Blood pressure is 90/52.  Continue to hold lisinopril.  Breast Cancer therapy associated bone loss: I have recommended calcium, Vitamin D and weight bearing exercises.  Orders placed this encounter:  No orders of the defined types were placed in this encounter.   The patient has a good understanding of the overall plan. She agrees with it. She will call with any problems that may develop before the next visit here.  Derek Jack, MD [place] [phone]   I, Thana Ates, am acting as a scribe for Dr. Derek Jack.  I, Derek Jack MD, have reviewed the  above documentation for accuracy and completeness, and I agree with the above.

## 2021-08-27 NOTE — Patient Instructions (Signed)
[place] at [facility] ?Discharge Instructions ? ? ?You were seen and examined today by Dr. Delton Coombes. ? ?He reviewed the results the results of your lab work which is stable. ? ?We will start you on an estrogen blocker pill called Arimidex.  You will take one pill a day every day for at least 5 years. ? ?You should take calcium and vitamin D.  You can buy them separately and take one of each daily.  ? ?Return as scheduled for lab work and office visit.  ? ? ?Thank you for choosing [place] at [facility] to provide your oncology and hematology care.  To afford each patient quality time with our provider, please arrive at least 15 minutes before your scheduled appointment time.  ? ?If you have a lab appointment with the [facility] please come in thru the Main Entrance and check in at the main information desk. ? ?You need to re-schedule your appointment should you arrive 10 or more minutes late.  We strive to give you quality time with our providers, and arriving late affects you and other patients whose appointments are after yours.  Also, if you no show three or more times for appointments you may be dismissed from the clinic at the providers discretion.     ?Again, thank you for choosing [facility].  Our hope is that these requests will decrease the amount of time that you wait before being seen by our physicians.       ?_____________________________________________________________ ? ?Should you have questions after your visit to [facility], please contact our office at [phone] and follow the prompts.  Our office hours are 8:00 a.m. and 4:30 p.m. Monday - Friday.  Please note that voicemails left after 4:00 p.m. may not be returned until the following business day.  We are closed weekends and major holidays.  You do have access to a nurse 24-7, just call the main number to the clinic [phone] and do not press any  options, hold on the line and a nurse will answer the phone.   ? ?For prescription refill requests, have your pharmacy contact our office and allow 72 hours.   ? ?Due to Covid, you will need to wear a mask upon entering the hospital. If you do not have a mask, a mask will be given to you at the Main Entrance upon arrival. For doctor visits, patients may have 1 support person age 37 or older with them. For treatment visits, patients can not have anyone with them due to social distancing guidelines and our immunocompromised population.  ? ?   ?

## 2021-08-27 NOTE — Progress Notes (Signed)
Patients port flushed without difficulty.  Good blood return noted with no bruising or swelling noted at site.  Band aid applied.  VSS with discharge and left in satisfactory condition with no s/s of distress noted.   

## 2021-09-10 ENCOUNTER — Encounter: Payer: Self-pay | Admitting: General Surgery

## 2021-09-10 ENCOUNTER — Ambulatory Visit (INDEPENDENT_AMBULATORY_CARE_PROVIDER_SITE_OTHER): Payer: Medicare Other | Admitting: General Surgery

## 2021-09-10 ENCOUNTER — Other Ambulatory Visit: Payer: Self-pay

## 2021-09-10 ENCOUNTER — Ambulatory Visit: Payer: Medicaid Other | Admitting: General Surgery

## 2021-09-10 VITALS — BP 124/75 | HR 62 | Temp 97.3°F | Resp 14 | Ht 64.0 in | Wt 125.0 lb

## 2021-09-10 DIAGNOSIS — Z95828 Presence of other vascular implants and grafts: Secondary | ICD-10-CM

## 2021-09-10 NOTE — Patient Instructions (Signed)
Implanted Port Removal ?Implanted port removal is a procedure to remove the port and catheter that are implanted under your skin. The port is a small disc under your skin that can be punctured with a needle. It is connected to a vein in your chest or neck by a small, thin tube (catheter). The implanted port is used to give medicines for treatments, and it may also be used to take blood samples. ?Your health care provider will remove the implanted port if: ?You no longer need it for treatment. ?It is not working properly. ?The area around it gets infected. ?Tell a health care provider about: ?Any allergies you have. ?All medicines you are taking, including vitamins, herbs, eye drops, creams, and over-the-counter medicines. ?Any problems you or family members have had with anesthetic medicines. ?Any bleeding problems you have. ?Any surgeries you have had. ?Any medical conditions you have. ?Whether you are pregnant or may be pregnant. ?What are the risks? ?Generally, this is a safe procedure. However, problems may occur, including: ?Infection. ?Bleeding. ?Allergic reactions to anesthetic medicines. ?Damage to nerves or blood vessels. ?What happens before the procedure? ?Medicines ?Ask your health care provider about: ?Changing or stopping your regular medicines. This is especially important if you are taking diabetes medicines or blood thinners. ?Taking medicines such as aspirin and ibuprofen. These medicines can thin your blood. Do not take these medicines unless your health care provider tells you to take them. ?Taking over-the-counter medicines, vitamins, herbs, and supplements. ?Tests ?You will have: ?A physical exam. ?Blood tests. ?Imaging tests, including a chest X-ray. ?General instructions ?Follow instructions from your health care provider about eating or drinking restrictions. ?Ask your health care provider: ?How your surgery site will be marked. ?What steps will be taken to help prevent infection. These  steps may include: ?Removing hair at the surgery site. ?Washing skin with a germ-killing soap. ?Taking antibiotic medicine. ?If you will be going home right after the procedure, plan to have a responsible adult: ?Take you home from the hospital or clinic. You will not be allowed to drive. ?Care for you for the time you are told. ?What happens during the procedure? ?You may be given one or more of the following: ?A medicine to help you relax (sedative). ?A medicine to numb the area (local anesthetic). ?A small incision will be made at the site of your implanted port. ?The implanted port and the catheter that has been inside your vein will be gently removed. ?The port and catheter will be inspected to make sure that all the parts have been removed. Part of the catheter may be tested for bacteria. ?The incision will be closed with stitches (sutures), adhesive strips, or skin glue. ?A bandage (dressing) will be placed over the incision. The health care provider may apply gentle pressure over the dressing for about 5 minutes. ?The procedure may vary among health care providers and hospitals. ?What happens after the procedure? ?Your blood pressure, heart rate, breathing rate, and blood oxygen level will be monitored until you leave the hospital or clinic. ?You will be monitored to make sure that there is no bleeding from the site where the port was removed. ?If you were given a sedative during the procedure, it can affect you for several hours. Do not drive or operate machinery until your health care provider says that it is safe. ?Summary ?Implanted port removal is a procedure to remove the port and catheter that are implanted under your skin. ?Before the procedure, follow your health care  provider's instructions about changing or stopping your regular medicines. This is especially important if you are taking diabetes medicines or blood thinners. ?If you will be going home right after the procedure, plan to have a  responsible adult care for you for the time you are told. ?This information is not intended to replace advice given to you by your health care provider. Make sure you discuss any questions you have with your health care provider. ?Document Revised: 12/09/2020 Document Reviewed: 12/09/2020 ?Elsevier Patient Education ? [place]. ? ?

## 2021-09-10 NOTE — Progress Notes (Signed)
[facility] History and Physical ? ?Reason for Referral: Port in place  ?Referring Physician:  Dr. Delton Coombes  ? ?Chief Complaint   ?Follow-up ?  ? ? ?Samantha Mcfarland is a 66 y.o. adult.  ?HPI: Samantha Mcfarland is a 66 yo known to me with a left breast invasive cancer s/p mastectomy and sentinel node. She hsa undergone her therapy and is ready to get her port removed. She has no complaints or issues. She has had no signs of infection with her port. She is getting stronger and her hair is starting to grow back.  ? ?Past Medical History:  ?Diagnosis Date  ? Allergy   ? Anxiety   ? Breast cancer ([place])   ? Depression   ? Family history of breast cancer   ? Family history of colon cancer   ? Family history of pancreatic cancer   ? Family history of thyroid cancer   ? GERD (gastroesophageal reflux disease)   ? Hyperlipidemia   ? Hypertension   ? Osteopenia 04/30/2021  ? Port-A-Cath in place 05/07/2021  ? Vitamin D insufficiency   ? ? ?Past Surgical History:  ?Procedure Laterality Date  ? EXCISION OF SKIN TAG Right 05/27/2021  ? Procedure: EXCISION OF RIGHT SKIN TAG;  Surgeon: Virl Cagey, MD;  Location: [facility];  Service: General;  Laterality: Right;  ? MASTECTOMY W/ SENTINEL NODE BIOPSY Left 03/23/2021  ? Procedure: TOTAL MASTECTOMY WITH SENTINEL LYMPH NODE BIOPSY;  Surgeon: Virl Cagey, MD;  Location: [facility];  Service: General;  Laterality: Left;  ? NECK SURGERY    ? OOPHORECTOMY    ? PORTACATH PLACEMENT Right 05/27/2021  ? Procedure: INSERTION PORT-A-CATH;  Surgeon: Virl Cagey, MD;  Location: [facility];  Service: General;  Laterality: Right;  ? SALPINGECTOMY    ? ? ?Family History  ?Problem Relation Age of Onset  ? Stroke Mother   ? Alcohol abuse Mother   ? Diabetes Father   ? Pancreatic cancer Father 33  ? Testicular cancer Father   ?     dx 54s  ? Pancreatic disease Father   ? Breast cancer Maternal Aunt   ?     dx 50s-60s  ? Breast cancer Paternal Aunt 43  ? Thyroid cancer Paternal Uncle   ?  Breast cancer Maternal Grandmother   ?     dx 40s  ? Colon cancer Paternal Grandfather   ?     dx 48s  ? ? ?Social History  ? ?Tobacco Use  ? Smoking status: Former  ?  Packs/day: 0.25  ?  Types: Cigarettes  ? Smokeless tobacco: Never  ? Tobacco comments:  ?  1 pack every 3-4 days  ?Vaping Use  ? Vaping Use: Never used  ?Substance Use Topics  ? Alcohol use: Yes  ?  Alcohol/week: 20.0 standard drinks  ?  Types: 20 Cans of beer per week  ?  Comment: 3-4 beers per day  ? Drug use: Never  ? ? ?Medications: I have reviewed the patient's current medications. ?Allergies as of 09/10/2021   ? ?   Reactions  ? Ibuprofen Other (See Comments)  ? "Stomach upset"  ? Penicillins   ? Allergic as a child. (Rash ???) Does not recall what type of reaction she has to drug.  ? ?  ? ?  ?Medication List  ?  ? ?  ? Accurate as of September 10, 2021 10:17 AM. If you have any questions, ask your nurse or doctor.  ?  ?  ? ?  ? ?  STOP taking these medications   ? ?CYCLOPHOSPHAMIDE IV ?Stopped by: Virl Cagey, MD ?  ?lisinopril 10 MG tablet ?Commonly known as: ZESTRIL ?Stopped by: Virl Cagey, MD ?  ?prochlorperazine 10 MG tablet ?Commonly known as: COMPAZINE ?Stopped by: Virl Cagey, MD ?  ?TAXOTERE IV ?Stopped by: Virl Cagey, MD ?  ? ?  ? ?TAKE these medications   ? ?acetaminophen 325 MG tablet ?Commonly known as: TYLENOL ?Take 325 mg by mouth every 6 (six) hours as needed for moderate pain or headache. ?  ?anastrozole 1 MG tablet ?Commonly known as: ARIMIDEX ?Take 1 tablet (1 mg total) by mouth daily. ?  ?atorvastatin 20 MG tablet ?Commonly known as: LIPITOR ?TAKE ONE TABLET ONCE DAILY ?  ?cholecalciferol 25 MCG (1000 UNIT) tablet ?Commonly known as: VITAMIN D3 ?Take 1,000 Units by mouth daily. ?  ?gabapentin 300 MG capsule ?Commonly known as: NEURONTIN ?Take 1 capsule (300 mg total) by mouth 2 (two) times daily. ?  ?lidocaine-prilocaine cream ?Commonly known as: EMLA ?Apply a small amount to port a cath site (do not rub  in) and cover with plastic wrap 1 hour prior to chemotherapy appointments ?  ?omeprazole 20 MG capsule ?Commonly known as: PRILOSEC ?Take 1 capsule (20 mg total) by mouth daily as needed (indigestion/heartburn). ?  ?oxyCODONE 5 MG immediate release tablet ?Commonly known as: Oxy IR/ROXICODONE ?TAKE ONE TABLET BY MOUTH EVERY 8 HOURS AS NEEDED FOR SEVERE PAIN OR BREAKTHROUGH PAIN ?  ?vitamin B-12 500 MCG tablet ?Commonly known as: CYANOCOBALAMIN ?Take 500 mcg by mouth daily. ?  ? ?  ? ? ? ?ROS:  ?A comprehensive review of systems was negative except for: port in place  ? ?Blood pressure 124/75, pulse 62, temperature (!) 97.3 ?F (36.3 ?C), temperature source Other (Comment), resp. rate 14, height '5\' 4"'$  (1.626 m), weight 125 lb (56.7 kg), SpO2 97 %. ?Physical Exam ?Vitals reviewed.  ?HENT:  ?   Head: Normocephalic.  ?   Nose: Nose normal.  ?   Mouth/Throat:  ?   Mouth: Mucous membranes are moist.  ?Eyes:  ?   Extraocular Movements: Extraocular movements intact.  ?Cardiovascular:  ?   Rate and Rhythm: Normal rate and regular rhythm.  ?Pulmonary:  ?   Effort: Pulmonary effort is normal.  ?   Breath sounds: Normal breath sounds.  ?Chest:  ?   Comments: Right chest port in place,  no erythema or drainage  ?Abdominal:  ?   General: There is no distension.  ?   Palpations: Abdomen is soft.  ?   Tenderness: There is no abdominal tenderness.  ?Musculoskeletal:  ?   Cervical back: Normal range of motion.  ?Skin: ?   General: Skin is warm.  ?Neurological:  ?   General: No focal deficit present.  ?   Mental Status: She is alert.  ?Psychiatric:     ?   Mood and Affect: Mood normal.     ?   Thought Content: Thought content normal.  ? ? ?Results: ?No results found for this or any previous visit (from the past 48 hour(s)). ? ?No results found. ? ? ?Assessment & Plan:  ?Samantha Mcfarland is a 66 y.o. adult with a port in place. She is ready to get this out. Discussed risk of bleeding, infection, incomplete removal.  ? ?Plan for 09/23/21  ?All  questions were answered to the satisfaction of the patient and family. ? ? ?Virl Cagey ?09/10/2021, 10:17 AM  ? ? ? ? ? ?

## 2021-09-10 NOTE — H&P (Signed)
[facility] History and Physical ?  ?Reason for Referral: Port in place  ?Referring Physician:  Dr. Delton Coombes  ?  ?Chief Complaint   ?Follow-up ?   ?  ?  ?Mark Benecke is a 66 y.o. adult.  ?HPI: Ms. Corporan is a 66 yo known to me with a left breast invasive cancer s/p mastectomy and sentinel node. She hsa undergone her therapy and is ready to get her port removed. She has no complaints or issues. She has had no signs of infection with her port. She is getting stronger and her hair is starting to grow back.  ?  ?    ?Past Medical History:  ?Diagnosis Date  ? Allergy    ? Anxiety    ? Breast cancer ([place])    ? Depression    ? Family history of breast cancer    ? Family history of colon cancer    ? Family history of pancreatic cancer    ? Family history of thyroid cancer    ? GERD (gastroesophageal reflux disease)    ? Hyperlipidemia    ? Hypertension    ? Osteopenia 04/30/2021  ? Port-A-Cath in place 05/07/2021  ? Vitamin D insufficiency    ?  ?  ?     ?Past Surgical History:  ?Procedure Laterality Date  ? EXCISION OF SKIN TAG Right 05/27/2021  ?  Procedure: EXCISION OF RIGHT SKIN TAG;  Surgeon: Virl Cagey, MD;  Location: [facility];  Service: General;  Laterality: Right;  ? MASTECTOMY W/ SENTINEL NODE BIOPSY Left 03/23/2021  ?  Procedure: TOTAL MASTECTOMY WITH SENTINEL LYMPH NODE BIOPSY;  Surgeon: Virl Cagey, MD;  Location: [facility];  Service: General;  Laterality: Left;  ? NECK SURGERY      ? OOPHORECTOMY      ? PORTACATH PLACEMENT Right 05/27/2021  ?  Procedure: INSERTION PORT-A-CATH;  Surgeon: Virl Cagey, MD;  Location: [facility];  Service: General;  Laterality: Right;  ? SALPINGECTOMY      ?  ?  ?     ?Family History  ?Problem Relation Age of Onset  ? Stroke Mother    ? Alcohol abuse Mother    ? Diabetes Father    ? Pancreatic cancer Father 92  ? Testicular cancer Father    ?      dx 54s  ? Pancreatic disease Father    ? Breast cancer Maternal Aunt    ?      dx 50s-60s  ? Breast cancer  Paternal Aunt 65  ? Thyroid cancer Paternal Uncle    ? Breast cancer Maternal Grandmother    ?      dx 34s  ? Colon cancer Paternal Grandfather    ?      dx 62s  ?  ?  ?Social History  ?  ?     ?Tobacco Use  ? Smoking status: Former  ?    Packs/day: 0.25  ?    Types: Cigarettes  ? Smokeless tobacco: Never  ? Tobacco comments:  ?    1 pack every 3-4 days  ?Vaping Use  ? Vaping Use: Never used  ?Substance Use Topics  ? Alcohol use: Yes  ?    Alcohol/week: 20.0 standard drinks  ?    Types: 20 Cans of beer per week  ?    Comment: 3-4 beers per day  ? Drug use: Never  ?  ?  ?Medications: I have reviewed the patient's  current medications. ?Allergies as of 09/10/2021   ?  ?    Reactions  ?  Ibuprofen Other (See Comments)  ?  "Stomach upset"  ?  Penicillins    ?  Allergic as a child. (Rash ???) Does not recall what type of reaction she has to drug.  ?  ?   ?  ?   ?Medication List  ?   ?  ?   ? Accurate as of September 10, 2021 10:17 AM. If you have any questions, ask your nurse or doctor.  ?  ?   ?  ?   ?  ?STOP taking these medications   ?  ?CYCLOPHOSPHAMIDE IV ?Stopped by: Virl Cagey, MD ?   ?lisinopril 10 MG tablet ?Commonly known as: ZESTRIL ?Stopped by: Virl Cagey, MD ?   ?prochlorperazine 10 MG tablet ?Commonly known as: COMPAZINE ?Stopped by: Virl Cagey, MD ?   ?TAXOTERE IV ?Stopped by: Virl Cagey, MD ?   ?  ?   ?  ?TAKE these medications   ?  ?acetaminophen 325 MG tablet ?Commonly known as: TYLENOL ?Take 325 mg by mouth every 6 (six) hours as needed for moderate pain or headache. ?   ?anastrozole 1 MG tablet ?Commonly known as: ARIMIDEX ?Take 1 tablet (1 mg total) by mouth daily. ?   ?atorvastatin 20 MG tablet ?Commonly known as: LIPITOR ?TAKE ONE TABLET ONCE DAILY ?   ?cholecalciferol 25 MCG (1000 UNIT) tablet ?Commonly known as: VITAMIN D3 ?Take 1,000 Units by mouth daily. ?   ?gabapentin 300 MG capsule ?Commonly known as: NEURONTIN ?Take 1 capsule (300 mg total) by mouth 2 (two) times  daily. ?   ?lidocaine-prilocaine cream ?Commonly known as: EMLA ?Apply a small amount to port a cath site (do not rub in) and cover with plastic wrap 1 hour prior to chemotherapy appointments ?   ?omeprazole 20 MG capsule ?Commonly known as: PRILOSEC ?Take 1 capsule (20 mg total) by mouth daily as needed (indigestion/heartburn). ?   ?oxyCODONE 5 MG immediate release tablet ?Commonly known as: Oxy IR/ROXICODONE ?TAKE ONE TABLET BY MOUTH EVERY 8 HOURS AS NEEDED FOR SEVERE PAIN OR BREAKTHROUGH PAIN ?   ?vitamin B-12 500 MCG tablet ?Commonly known as: CYANOCOBALAMIN ?Take 500 mcg by mouth daily. ?   ?  ?   ?  ?  ?  ?ROS:  ?A comprehensive review of systems was negative except for: port in place  ?  ?Blood pressure 124/75, pulse 62, temperature (!) 97.3 ?F (36.3 ?C), temperature source Other (Comment), resp. rate 14, height '5\' 4"'$  (1.626 m), weight 125 lb (56.7 kg), SpO2 97 %. ?Physical Exam ?Vitals reviewed.  ?HENT:  ?   Head: Normocephalic.  ?   Nose: Nose normal.  ?   Mouth/Throat:  ?   Mouth: Mucous membranes are moist.  ?Eyes:  ?   Extraocular Movements: Extraocular movements intact.  ?Cardiovascular:  ?   Rate and Rhythm: Normal rate and regular rhythm.  ?Pulmonary:  ?   Effort: Pulmonary effort is normal.  ?   Breath sounds: Normal breath sounds.  ?Chest:  ?   Comments: Right chest port in place,  no erythema or drainage  ?Abdominal:  ?   General: There is no distension.  ?   Palpations: Abdomen is soft.  ?   Tenderness: There is no abdominal tenderness.  ?Musculoskeletal:  ?   Cervical back: Normal range of motion.  ?Skin: ?   General: Skin is warm.  ?  Neurological:  ?   General: No focal deficit present.  ?   Mental Status: She is alert.  ?Psychiatric:     ?   Mood and Affect: Mood normal.     ?   Thought Content: Thought content normal.  ?  ?  ?Results: ?Lab Results Last 48 Hours  ?No results found for this or any previous visit (from the past 48 hour(s)).  ? ?  ?Imaging Results (Last 48 hours)  ?No results  found.  ? ?  ?  ?Assessment & Plan:  ?Angelice Piech is a 66 y.o. adult with a port in place. She is ready to get this out. Discussed risk of bleeding, infection, incomplete removal.  ?  ?Plan for 09/23/21  ?All questions were answered to the satisfaction of the patient and family. ?  ?  ?Virl Cagey ?09/10/2021, 10:17 AM  ?  ?

## 2021-09-11 ENCOUNTER — Other Ambulatory Visit (HOSPITAL_COMMUNITY): Payer: Self-pay | Admitting: Hematology

## 2021-09-11 DIAGNOSIS — C50912 Malignant neoplasm of unspecified site of left female breast: Secondary | ICD-10-CM

## 2021-09-23 ENCOUNTER — Encounter (HOSPITAL_COMMUNITY)
Admission: RE | Disposition: A | Discharge status: Home / Self Care | Payer: Self-pay | Source: Home / Self Care | Attending: General Surgery

## 2021-09-23 ENCOUNTER — Ambulatory Visit (HOSPITAL_COMMUNITY)
Admission: RE | Admit: 2021-09-23 | Discharge: 2021-09-23 | Disposition: A | Discharge status: Home / Self Care | Payer: Medicare Other | Attending: General Surgery | Admitting: General Surgery

## 2021-09-23 DIAGNOSIS — Z9012 Acquired absence of left breast and nipple: Secondary | ICD-10-CM | POA: Insufficient documentation

## 2021-09-23 DIAGNOSIS — Z452 Encounter for adjustment and management of vascular access device: Secondary | ICD-10-CM | POA: Insufficient documentation

## 2021-09-23 DIAGNOSIS — Z853 Personal history of malignant neoplasm of breast: Secondary | ICD-10-CM | POA: Diagnosis not present

## 2021-09-23 DIAGNOSIS — Z95828 Presence of other vascular implants and grafts: Secondary | ICD-10-CM

## 2021-09-23 DIAGNOSIS — C50912 Malignant neoplasm of unspecified site of left female breast: Secondary | ICD-10-CM

## 2021-09-23 HISTORY — PX: PORT-A-CATH REMOVAL: SHX5289

## 2021-09-23 SURGERY — MINOR REMOVAL PORT-A-CATH
Anesthesia: LOCAL | Laterality: Right

## 2021-09-23 MED ORDER — OXYCODONE HCL 5 MG PO TABS: 5.0000 mg | ORAL_TABLET | Freq: Four times a day (QID) | ORAL | 0 refills | Status: DC | PRN

## 2021-09-23 MED ORDER — LIDOCAINE HCL (PF) 1 % IJ SOLN
INTRAMUSCULAR | Status: DC | PRN
  Administered 2021-09-23: 8 mL via SUBCUTANEOUS

## 2021-09-23 MED ORDER — CHLORHEXIDINE GLUCONATE CLOTH 2 % EX PADS: 6.0000 | MEDICATED_PAD | Freq: Once | CUTANEOUS | Status: DC

## 2021-09-23 MED ORDER — LIDOCAINE HCL (PF) 1 % IJ SOLN
INTRAMUSCULAR | Status: AC
  Filled 2021-09-23: qty 30

## 2021-09-23 SURGICAL SUPPLY — 15 items
ADH SKN CLS APL DERMABOND .7 (GAUZE/BANDAGES/DRESSINGS) ×1
APL PRP STRL LF ISPRP CHG 10.5 (MISCELLANEOUS)
APPLICATOR CHLORAPREP 10.5 ORG (MISCELLANEOUS) IMPLANT
CLOTH BEACON ORANGE TIMEOUT ST (SAFETY) ×2 IMPLANT
DECANTER SPIKE VIAL GLASS SM (MISCELLANEOUS) ×2 IMPLANT
DERMABOND ADVANCED (GAUZE/BANDAGES/DRESSINGS) ×1
DERMABOND ADVANCED .7 DNX12 (GAUZE/BANDAGES/DRESSINGS) ×1 IMPLANT
DRAPE HALF SHEET 40X57 (DRAPES) IMPLANT
GLOVE SURG ENC MOIS LTX SZ6.5 (GLOVE) IMPLANT
GLOVE SURG UNDER POLY LF SZ7 (GLOVE) IMPLANT
GOWN STRL REUS W/TWL LRG LVL3 (GOWN DISPOSABLE) IMPLANT
SPONGE GAUZE 2X2 8PLY STRL LF (GAUZE/BANDAGES/DRESSINGS) ×2 IMPLANT
SUT MNCRL AB 4-0 PS2 18 (SUTURE) ×2 IMPLANT
SUT VIC AB 3-0 SH 27 (SUTURE) ×2
SUT VIC AB 3-0 SH 27X BRD (SUTURE) ×1 IMPLANT

## 2021-09-23 NOTE — Op Note (Signed)
[facility] ?Procedure Note ? ?09/23/21 ? ?Pre-procedure Diagnosis:  Port in place, right subclavian  ?  ?Post-procedure Diagnosis: Same ?  ?Procedure(s) Performed: Port removal  ?  ?Surgeon: Lanell Matar. Constance Haw, MD ?  ?Assistants: No qualified resident was available  ?  ?Anesthesia: Lidocaine 1% ?   ?Specimens:  ?  ?Estimated Blood Loss: Minimal ? ?Wound Class: Clean ?  ?Procedure Indications: Samantha Mcfarland is a 66 yo with a port in place for breast cancer. She has finished treatment and wants it removed. We discussed removal with local and risk of bleeding, infection, incomplete removal.  ? ?Findings: normal appearing port, fully removed ?  ?Procedure: The patient was taken to the procedure room and placed semi upright. The right chest was prepared and draped in the usual sterile fashion. Lidocaine 1% was injected.  ? ?An incision was made in her prior port incision and carried down to the fascia. The port was excised and the stay sutures were excised in its entirety. The catheter was removed and my assistant held pressure for 15 minutes. The cavity was irrigated and closed with 3-0 Vicryl interrupted and 4-0 Monocryl subcuticular and dermabond.  ? ?Final inspection revealed acceptable hemostasis. The patient tolerated the procedure well.  ? ?Roxicodone for pain control sent to Rx ? ?[name], MD ?[facility] ?[place],  [id number] ?[phone] (office) ? ? ?

## 2021-09-23 NOTE — Interval H&P Note (Signed)
History and Physical Interval Note: ? ?09/23/2021 ?8:11 AM ? ?Samantha Mcfarland  has presented today for surgery, with the diagnosis of Breast cancer.  The various methods of treatment have been discussed with the patient and family. After consideration of risks, benefits and other options for treatment, the patient has consented to  Procedure(s): ?MINOR REMOVAL PORT-A-CATH (Right) as a surgical intervention.  The patient's history has been reviewed, patient examined, no change in status, stable for surgery.  I have reviewed the patient's chart and labs.  Questions were answered to the patient's satisfaction.   ? ? ?Virl Cagey ? ? ?

## 2021-09-23 NOTE — Discharge Instructions (Signed)
Keep area clean and dry. You can take a shower in 24 hours. Do not submerge in water.  ?Take tylenol and ibuprofen for pain control and Roxicodone for severe pain. ?Stay upright for 4 hours.  ? ?

## 2021-09-24 ENCOUNTER — Encounter (HOSPITAL_COMMUNITY): Payer: Self-pay | Admitting: General Surgery

## 2021-09-28 ENCOUNTER — Other Ambulatory Visit (HOSPITAL_COMMUNITY): Payer: Self-pay

## 2021-09-28 DIAGNOSIS — C50912 Malignant neoplasm of unspecified site of left female breast: Secondary | ICD-10-CM

## 2021-10-12 ENCOUNTER — Other Ambulatory Visit (HOSPITAL_COMMUNITY): Payer: Self-pay | Admitting: *Deleted

## 2021-10-12 DIAGNOSIS — C50912 Malignant neoplasm of unspecified site of left female breast: Secondary | ICD-10-CM

## 2021-10-13 ENCOUNTER — Encounter (HOSPITAL_COMMUNITY): Payer: Self-pay | Admitting: Hematology

## 2021-10-13 ENCOUNTER — Encounter (HOSPITAL_COMMUNITY): Payer: Self-pay | Admitting: *Deleted

## 2021-11-10 ENCOUNTER — Other Ambulatory Visit: Payer: Self-pay | Admitting: Family Medicine

## 2021-11-10 DIAGNOSIS — E782 Mixed hyperlipidemia: Secondary | ICD-10-CM

## 2021-11-24 ENCOUNTER — Other Ambulatory Visit: Payer: Self-pay | Admitting: Family Medicine

## 2021-11-24 DIAGNOSIS — I10 Essential (primary) hypertension: Secondary | ICD-10-CM

## 2021-11-26 ENCOUNTER — Other Ambulatory Visit (HOSPITAL_COMMUNITY): Payer: Medicare Other

## 2021-11-26 ENCOUNTER — Inpatient Hospital Stay (HOSPITAL_COMMUNITY): Payer: Medicare Other | Attending: Hematology

## 2021-11-26 DIAGNOSIS — Z79899 Other long term (current) drug therapy: Secondary | ICD-10-CM | POA: Insufficient documentation

## 2021-11-26 DIAGNOSIS — C50912 Malignant neoplasm of unspecified site of left female breast: Secondary | ICD-10-CM | POA: Diagnosis not present

## 2021-11-26 DIAGNOSIS — M542 Cervicalgia: Secondary | ICD-10-CM | POA: Diagnosis not present

## 2021-11-26 DIAGNOSIS — Z87891 Personal history of nicotine dependence: Secondary | ICD-10-CM | POA: Insufficient documentation

## 2021-11-26 DIAGNOSIS — Z79811 Long term (current) use of aromatase inhibitors: Secondary | ICD-10-CM | POA: Insufficient documentation

## 2021-11-26 DIAGNOSIS — Z170 Estrogen receptor positive status [ER+]: Secondary | ICD-10-CM | POA: Insufficient documentation

## 2021-11-26 LAB — CBC WITH DIFFERENTIAL/PLATELET
Abs Immature Granulocytes: 0.01 10*3/uL (ref 0.00–0.07)
Basophils Absolute: 0 10*3/uL (ref 0.0–0.1)
Basophils Relative: 1 %
Eosinophils Absolute: 0.1 10*3/uL (ref 0.0–0.5)
Eosinophils Relative: 1 %
HCT: 33.3 % — ABNORMAL LOW (ref 36.0–46.0)
Hemoglobin: 12.1 g/dL (ref 12.0–15.0)
Immature Granulocytes: 0 %
Lymphocytes Relative: 39 %
Lymphs Abs: 1.5 10*3/uL (ref 0.7–4.0)
MCH: 33.5 pg (ref 26.0–34.0)
MCHC: 36.3 g/dL — ABNORMAL HIGH (ref 30.0–36.0)
MCV: 92.2 fL (ref 80.0–100.0)
Monocytes Absolute: 0.3 10*3/uL (ref 0.1–1.0)
Monocytes Relative: 8 %
Neutro Abs: 1.9 10*3/uL (ref 1.7–7.7)
Neutrophils Relative %: 51 %
Platelets: 242 10*3/uL (ref 150–400)
RBC: 3.61 MIL/uL — ABNORMAL LOW (ref 3.87–5.11)
RDW: 12.4 % (ref 11.5–15.5)
WBC: 3.8 10*3/uL — ABNORMAL LOW (ref 4.0–10.5)
nRBC: 0 % (ref 0.0–0.2)

## 2021-11-26 LAB — COMPREHENSIVE METABOLIC PANEL
ALT: 16 U/L (ref 0–44)
AST: 28 U/L (ref 15–41)
Albumin: 4.8 g/dL (ref 3.5–5.0)
Alkaline Phosphatase: 39 U/L (ref 38–126)
Anion gap: 10 (ref 5–15)
BUN: 5 mg/dL — ABNORMAL LOW (ref 8–23)
CO2: 25 mmol/L (ref 22–32)
Calcium: 9.2 mg/dL (ref 8.9–10.3)
Chloride: 87 mmol/L — ABNORMAL LOW (ref 98–111)
Creatinine, Ser: 0.51 mg/dL (ref 0.44–1.00)
GFR, Estimated: >60 mL/min (ref >60)
Glucose, Bld: 78 mg/dL (ref 70–99)
Potassium: 4 mmol/L (ref 3.5–5.1)
Sodium: 122 mmol/L — ABNORMAL LOW (ref 135–145)
Total Bilirubin: 0.5 mg/dL (ref 0.3–1.2)
Total Protein: 7.2 g/dL (ref 6.5–8.1)

## 2021-11-26 LAB — VITAMIN D 25 HYDROXY (VIT D DEFICIENCY, FRACTURES): Vit D, 25-Hydroxy: 37.07 ng/mL (ref 30–100)

## 2021-11-26 LAB — MAGNESIUM: Magnesium: 2 mg/dL (ref 1.7–2.4)

## 2021-11-28 ENCOUNTER — Other Ambulatory Visit: Payer: Self-pay | Admitting: Nurse Practitioner

## 2021-12-02 DIAGNOSIS — H25811 Combined forms of age-related cataract, right eye: Secondary | ICD-10-CM | POA: Diagnosis not present

## 2021-12-02 DIAGNOSIS — H25812 Combined forms of age-related cataract, left eye: Secondary | ICD-10-CM | POA: Diagnosis not present

## 2021-12-02 DIAGNOSIS — Z01818 Encounter for other preprocedural examination: Secondary | ICD-10-CM | POA: Diagnosis not present

## 2021-12-03 ENCOUNTER — Inpatient Hospital Stay (HOSPITAL_BASED_OUTPATIENT_CLINIC_OR_DEPARTMENT_OTHER): Payer: Medicare Other | Admitting: Hematology

## 2021-12-03 VITALS — BP 149/81 | HR 61 | Temp 97.5°F | Resp 18 | Ht 65.95 in | Wt 117.4 lb

## 2021-12-03 DIAGNOSIS — Z79811 Long term (current) use of aromatase inhibitors: Secondary | ICD-10-CM | POA: Diagnosis not present

## 2021-12-03 DIAGNOSIS — Z170 Estrogen receptor positive status [ER+]: Secondary | ICD-10-CM | POA: Diagnosis not present

## 2021-12-03 DIAGNOSIS — C50912 Malignant neoplasm of unspecified site of left female breast: Secondary | ICD-10-CM | POA: Diagnosis not present

## 2021-12-03 DIAGNOSIS — M542 Cervicalgia: Secondary | ICD-10-CM | POA: Diagnosis not present

## 2021-12-03 DIAGNOSIS — Z79899 Other long term (current) drug therapy: Secondary | ICD-10-CM | POA: Diagnosis not present

## 2021-12-03 DIAGNOSIS — Z87891 Personal history of nicotine dependence: Secondary | ICD-10-CM | POA: Diagnosis not present

## 2021-12-03 MED ORDER — OXYCODONE HCL 5 MG PO TABS: 5.0000 mg | ORAL_TABLET | Freq: Every evening | ORAL | 0 refills | Status: DC | PRN

## 2021-12-03 NOTE — Patient Instructions (Signed)
[place] at [facility] Discharge Instructions  You were seen and examined today by Dr. Delton Coombes.  Dr. Delton Coombes discussed your most recent lab work and everything looks good except for your sodium is low. Restart your lisinopril as your blood pressure is high today. Continue taking Vitamin D, Calcium, Gabapentin and Anastrozole as prescribed.   Follow-up as scheduled in 4 months.    Thank you for choosing [place] at [facility] to provide your oncology and hematology care.  To afford each patient quality time with our provider, please arrive at least 15 minutes before your scheduled appointment time.   If you have a lab appointment with the [facility] please come in thru the Main Entrance and check in at the main information desk.  You need to re-schedule your appointment should you arrive 10 or more minutes late.  We strive to give you quality time with our providers, and arriving late affects you and other patients whose appointments are after yours.  Also, if you no show three or more times for appointments you may be dismissed from the clinic at the providers discretion.     Again, thank you for choosing [facility].  Our hope is that these requests will decrease the amount of time that you wait before being seen by our physicians.       _____________________________________________________________  Should you have questions after your visit to [facility], please contact our office at [phone] and follow the prompts.  Our office hours are 8:00 a.m. and 4:30 p.m. Monday - Friday.  Please note that voicemails left after 4:00 p.m. may not be returned until the following business day.  We are closed weekends and major holidays.  You do have access to a nurse 24-7, just call the main number to the clinic [phone] and do not press any options, hold on the line and a nurse will answer the phone.    For  prescription refill requests, have your pharmacy contact our office and allow 72 hours.    Due to Covid, you will need to wear a mask upon entering the hospital. If you do not have a mask, a mask will be given to you at the Main Entrance upon arrival. For doctor visits, patients may have 1 support person age 29 or older with them. For treatment visits, patients can not have anyone with them due to social distancing guidelines and our immunocompromised population.

## 2021-12-03 NOTE — [place] [facility] (Signed)
[facility] [address], [place] [address]   Patient Care Team: Loman Brooklyn, FNP as PCP - General (Family Medicine) Derek Jack, MD as Medical Oncologist (Medical Oncology) Brien Mates, RN as Oncology Nurse Navigator (Oncology)  SUMMARY OF ONCOLOGIC HISTORY: Oncology History  Breast cancer, left ([place])  04/22/2021 Initial Diagnosis   Breast cancer, left ([place])   04/22/2021 Cancer Staging   Staging form: Breast, AJCC 8th Edition - Clinical stage from 04/22/2021: Stage IA (cT1c, cN0, cM0, G2, ER+, PR-, HER2-) - Signed by Derek Jack, MD on 04/22/2021 Stage prefix: Initial diagnosis Nuclear grade: G2 Histologic grading system: 3 grade system HER2-IHC interpretation: Negative HER2-IHC value: Score 1+ Ki-67 (%): 25   05/28/2021 - 08/07/2021 Chemotherapy   Patient is on Treatment Plan : BREAST TC q21d      Genetic Testing   Negative genetic testing. No pathogenic variants identified on the [facility] Multi-Cancer+RNA panel. The report date is 05/10/2021.  The Multi-Cancer Panel + RNA offered by [facility] includes sequencing and/or deletion duplication testing of the following 84 genes: AIP, ALK, APC, ATM, AXIN2,BAP1,  BARD1, BLM, BMPR1A, BRCA1, BRCA2, BRIP1, CASR, CDC73, CDH1, CDK4, CDKN1B, CDKN1C, CDKN2A (p14ARF), CDKN2A (p16INK4a), CEBPA, CHEK2, CTNNA1, DICER1, DIS3L2, EGFR (c.2369C>T, p.Thr790Met variant only), EPCAM (Deletion/duplication testing only), FH, FLCN, GATA2, GPC3, GREM1 (Promoter region deletion/duplication testing only), HOXB13 (c.251G>A, p.Gly84Glu), HRAS, KIT, MAX, MEN1, MET, MITF (c.952G>A, p.Glu318Lys variant only), MLH1, MSH2, MSH3, MSH6, MUTYH, NBN, NF1, NF2, NTHL1, PALB2, PDGFRA, PHOX2B, PMS2, POLD1, POLE, POT1, PRKAR1A, PTCH1, PTEN, RAD50, RAD51C, RAD51D, RB1, RECQL4, RET, RUNX1, SDHAF2, SDHA (sequence changes only), SDHB, SDHC, SDHD, SMAD4, SMARCA4, SMARCB1, SMARCE1, STK11, SUFU, TERC, TERT, TMEM127, TP53, TSC1, TSC2, VHL, WRN  and WT1.     CHIEF COMPLIANT: Follow-up for left breast cancer   INTERVAL HISTORY: Samantha Mcfarland is a 66 y.o. adult here today for follow up of her left breast cancer. Her last visit was on 08/27/2021.   Today she reports feeling well. She reports pain in her legs bilaterally and occasional burning in her left foot for which she is taking gabapentin BID. She also reports neck pain. She is taking anastrozole and reports hot flashes which are improving. Her appetite and energy are poor. She has lost 8 lbs since her last visit.    REVIEW OF SYSTEMS:   Review of Systems  Constitutional:  Positive for appetite change and fatigue.  Gastrointestinal:  Positive for constipation and nausea.  Endocrine: Positive for hot flashes (improving).  Musculoskeletal:  Positive for arthralgias (5/10 legs) and neck pain.  Neurological:  Positive for headaches.  Psychiatric/Behavioral:  Positive for depression.   All other systems reviewed and are negative.   I have reviewed the past medical history, past surgical history, social history and family history with the patient and they are unchanged from previous note.   ALLERGIES:   is allergic to ibuprofen and penicillins.   MEDICATIONS:  Current Outpatient Medications  Medication Sig Dispense Refill   acetaminophen (TYLENOL) 325 MG tablet Take 325 mg by mouth every 6 (six) hours as needed for moderate pain or headache.     anastrozole (ARIMIDEX) 1 MG tablet Take 1 tablet (1 mg total) by mouth daily. 90 tablet 4   atorvastatin (LIPITOR) 20 MG tablet Take 1 tablet (20 mg total) by mouth daily. (NEEDS TO BE SEEN BEFORE NEXT REFILL) 30 tablet 0   cholecalciferol (VITAMIN D3) 25 MCG (1000 UNIT) tablet Take 1,000 Units by mouth daily.  gabapentin (NEURONTIN) 300 MG capsule Take 1 capsule (300 mg total) by mouth 2 (two) times daily. 60 capsule 3   omeprazole (PRILOSEC) 20 MG capsule Take 1 capsule (20 mg total) by mouth daily as needed  (indigestion/heartburn). 30 capsule 2   oxyCODONE (OXY IR/ROXICODONE) 5 MG immediate release tablet Take 1 tablet (5 mg total) by mouth every 6 (six) hours as needed for severe pain. 10 tablet 0   vitamin B-12 (CYANOCOBALAMIN) 500 MCG tablet Take 500 mcg by mouth daily.     No current facility-administered medications for this visit.     PHYSICAL EXAMINATION: Performance status (ECOG): 1 - Symptomatic but completely ambulatory  There were no vitals filed for this visit. Wt Readings from Last 3 Encounters:  09/10/21 125 lb (56.7 kg)  08/27/21 125 lb 3.2 oz (56.8 kg)  08/06/21 123 lb 7.3 oz (56 kg)   Physical Exam Vitals reviewed.  Constitutional:      Appearance: Normal appearance.  Cardiovascular:     Rate and Rhythm: Normal rate and regular rhythm.     Pulses: Normal pulses.     Heart sounds: Normal heart sounds.  Pulmonary:     Effort: Pulmonary effort is normal.     Breath sounds: Normal breath sounds.  Neurological:     General: No focal deficit present.     Mental Status: She is alert and oriented to person, place, and time.  Psychiatric:        Mood and Affect: Mood normal.        Behavior: Behavior normal.     Breast Exam Chaperone: Samantha Mcfarland     LABORATORY DATA:  I have reviewed the data as listed    Latest Ref Rng & Units 11/26/2021    1:10 PM 08/27/2021   10:15 AM 08/06/2021    8:58 AM  CMP  Glucose 70 - 99 mg/dL 78  92  80   BUN 8 - 23 mg/dL $Remove'5  7  9   'KWQvmAN$ Creatinine 0.44 - 1.00 mg/dL 0.51  0.63  0.54   Sodium 135 - 145 mmol/L 122  128  130   Potassium 3.5 - 5.1 mmol/L 4.0  4.0  4.0   Chloride 98 - 111 mmol/L 87  96  98   CO2 22 - 32 mmol/L $RemoveB'25  24  22   'JXLnNcTq$ Calcium 8.9 - 10.3 mg/dL 9.2  9.0  8.8   Total Protein 6.5 - 8.1 g/dL 7.2  6.6  6.3   Total Bilirubin 0.3 - 1.2 mg/dL 0.5  0.8  0.2   Alkaline Phos 38 - 126 U/L 39  59  52   AST 15 - 41 U/L $Remo'28  25  21   'oOriW$ ALT 0 - 44 U/L $Remo'16  13  12    'TKHkL$ No results found for: "BTD974" Lab Results  Component Value Date    WBC 3.8 (L) 11/26/2021   HGB 12.1 11/26/2021   HCT 33.3 (L) 11/26/2021   MCV 92.2 11/26/2021   PLT 242 11/26/2021   NEUTROABS 1.9 11/26/2021    ASSESSMENT:  Stage I (T1CN0) left breast IDC, ER positive, HER2/PR negative: - Abnormal mammogram followed by left breast subareolar mass biopsy on 02/24/2021 consistent with DCIS intermediate to high-grade - Left mastectomy and SLNB on 03/23/2021 - Pathology shows 1.5 cm IDC, grade 2, extensive DCIS, margins negative, 0/2 left axillary lymph nodes, PT1CN0.  ER 100%.  PR-negative.  HER2 1+.  Ki-67-25%. - Oncotype DX showed recurrence score 33.  There is  more than 15% chemotherapy benefit.  Distant recurrence at 9 years with tamoxifen alone was 21%. - 4 cycles of TC from 05/28/2021 through 08/06/2021. - [facility] genetic testing negative. - Anastrozole started on 08/27/2021.   Social/family history: - She lives by herself.  She is seen with her sister today. 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 - She quit smoking 1 month ago.  Half pack per day for 35 years. - She drinks alcohol 3-5 beers daily. - Father had prostate and pancreatic cancer.  Maternal grandmother and maternal aunt had breast cancer.  Paternal grandfather had colon cancer.  Other people in her family also had cancers, type unknown to the patient.   PLAN:  Stage I (T1CN0) left breast IDC, ER positive, HER2/PR negative, Oncotype DX 33: -She is tolerating anastrozole very well. - Labs showed normal LFTs.  CBC was grossly normal with mild leukopenia. - I will schedule her for right breast mammogram in September. - She has chronic hyponatremia which is slightly worse on the recent labs.  She is asymptomatic. - RTC 4 months for follow-up.  We will repeat labs.  2.  Bone health (osteopenia): -DEXA scan on 04/30/2021 with T score -1.5. - Continue calcium and vitamin D supplements.  Vitamin D level is 37.  3.  Chronic pain syndrome: -She has chronic leg pains  and neck pains.  Continue gabapentin 300 mg 3 times daily for neuropathic pains. - I will give her last prescription of oxycodone 5 mg once daily as needed. - We will make a referral to pain clinic in [place].  4.  Hypotension: -Lisinopril is on hold.  Blood pressure today is 149/81.  She was told to start back on lisinopril daily.  Breast Cancer therapy associated bone loss: I have recommended calcium, Vitamin D and weight bearing exercises.  Orders placed this encounter:  No orders of the defined types were placed in this encounter.   The patient has a good understanding of the overall plan. She agrees with it. She will call with any problems that may develop before the next visit here.  Derek Jack, MD [place] [phone]   I, Samantha Mcfarland, am acting as a scribe for Dr. Derek Jack.  I, Derek Jack MD, have reviewed the above documentation for accuracy and completeness, and I agree with the above.

## 2021-12-04 MED ORDER — HYDROCODONE-ACETAMINOPHEN 5-325 MG PO TABS: 1.0000 | ORAL_TABLET | Freq: Every evening | ORAL | 0 refills | Status: DC | PRN

## 2021-12-04 NOTE — Addendum Note (Signed)
Addended by: Derek Jack on: 12/04/2021 04:00 PM   Modules accepted: Orders

## 2021-12-11 DIAGNOSIS — H269 Unspecified cataract: Secondary | ICD-10-CM | POA: Diagnosis not present

## 2021-12-11 DIAGNOSIS — H25811 Combined forms of age-related cataract, right eye: Secondary | ICD-10-CM | POA: Diagnosis not present

## 2021-12-15 ENCOUNTER — Other Ambulatory Visit: Payer: Self-pay | Admitting: Family Medicine

## 2021-12-15 DIAGNOSIS — E782 Mixed hyperlipidemia: Secondary | ICD-10-CM

## 2021-12-25 DIAGNOSIS — H269 Unspecified cataract: Secondary | ICD-10-CM | POA: Diagnosis not present

## 2021-12-25 DIAGNOSIS — H25812 Combined forms of age-related cataract, left eye: Secondary | ICD-10-CM | POA: Diagnosis not present

## 2022-01-05 ENCOUNTER — Ambulatory Visit (INDEPENDENT_AMBULATORY_CARE_PROVIDER_SITE_OTHER): Payer: Medicare Other | Admitting: Family

## 2022-01-05 ENCOUNTER — Encounter: Payer: Self-pay | Admitting: Family

## 2022-01-05 VITALS — BP 119/76 | HR 85 | Temp 97.6°F | Ht 65.95 in | Wt 117.0 lb

## 2022-01-05 DIAGNOSIS — Z170 Estrogen receptor positive status [ER+]: Secondary | ICD-10-CM | POA: Diagnosis not present

## 2022-01-05 DIAGNOSIS — C50912 Malignant neoplasm of unspecified site of left female breast: Secondary | ICD-10-CM | POA: Diagnosis not present

## 2022-01-05 DIAGNOSIS — R112 Nausea with vomiting, unspecified: Secondary | ICD-10-CM | POA: Diagnosis not present

## 2022-01-05 DIAGNOSIS — R197 Diarrhea, unspecified: Secondary | ICD-10-CM

## 2022-01-05 MED ORDER — ONDANSETRON HCL 4 MG PO TABS: 4.0000 mg | ORAL_TABLET | Freq: Three times a day (TID) | ORAL | 0 refills | Status: DC | PRN

## 2022-01-05 NOTE — Progress Notes (Signed)
Subjective:    Patient ID: Samantha Mcfarland, adult    DOB: October 15, 1955, 66 y.o.   MRN: [id number]  Chief Complaint  Patient presents with   Nausea   Emesis   Fatigue   Pt presents to the office today with nausea and vomiting, diarrhea  that started 6 days ago. States she thought it was getting better, but started having chills yesterday.   She has left breast cancer. Completed chemo in April.  Emesis  This is a new problem. The current episode started in the past 7 days. The problem occurs 2 to 4 times per day. The problem has been waxing and waning. The emesis has an appearance of stomach contents. There has been no fever. Associated symptoms include chills, diarrhea, myalgias (no more than normal) and sweats. Pertinent negatives include no arthralgias, chest pain, coughing, dizziness, fever or headaches. She has tried bed rest, diet change and increased fluids for the symptoms. The treatment provided mild relief.  Diarrhea  This is a new problem. The current episode started in the past 7 days. The problem occurs 5 to 10 times per day. The problem has been waxing and waning. The stool consistency is described as Watery. Associated symptoms include chills, myalgias (no more than normal), sweats and vomiting. Pertinent negatives include no arthralgias, coughing, fever or headaches. The treatment provided mild relief.      Review of Systems  Constitutional:  Positive for chills. Negative for fever.  Respiratory:  Negative for cough.   Cardiovascular:  Negative for chest pain.  Gastrointestinal:  Positive for diarrhea and vomiting.  Musculoskeletal:  Positive for myalgias (no more than normal). Negative for arthralgias.  Neurological:  Negative for dizziness and headaches.  All other systems reviewed and are negative.      Objective:   Physical Exam Vitals reviewed.  Constitutional:      General: She is not in acute distress.    Appearance: She is well-developed.  HENT:     Head:  Normocephalic.     Right Ear: Tympanic membrane normal.     Left Ear: Tympanic membrane normal.  Eyes:     General:        Right eye: No discharge.        Left eye: No discharge.     Pupils: Pupils are equal, round, and reactive to light.  Neck:     Thyroid: No thyromegaly.  Cardiovascular:     Rate and Rhythm: Normal rate and regular rhythm.     Heart sounds: Normal heart sounds. No murmur heard. Pulmonary:     Effort: Pulmonary effort is normal. No respiratory distress.     Breath sounds: Normal breath sounds. No wheezing.  Abdominal:     General: Bowel sounds are normal. There is no distension.     Palpations: Abdomen is soft.     Tenderness: There is no abdominal tenderness.     Comments: No pain on exam  Musculoskeletal:        General: No tenderness. Normal range of motion.     Cervical back: Normal range of motion and neck supple.  Skin:    General: Skin is warm and dry.     Findings: No erythema or rash.  Neurological:     Mental Status: She is alert and oriented to person, place, and time.     Cranial Nerves: No cranial nerve deficit.     Motor: Weakness (generalized weakness) present.     Deep Tendon Reflexes: Reflexes are normal  and symmetric.  Psychiatric:        Behavior: Behavior normal.        Thought Content: Thought content normal.        Judgment: Judgment normal.      BP 119/76   Pulse 85   Temp 97.6 F (36.4 C)   Ht 5' 5.95" (1.675 m)   Wt 117 lb (53.1 kg)   SpO2 100%   BMI 18.91 kg/m      Assessment & Plan:  Samantha Mcfarland comes in today with chief complaint of Nausea, Emesis, and Fatigue   Diagnosis and orders addressed:  1. Nausea and vomiting, unspecified vomiting type - ondansetron (ZOFRAN) 4 MG tablet; Take 1 tablet (4 mg total) by mouth every 8 (eight) hours as needed for nausea or vomiting.  Dispense: 20 tablet; Refill: 0 - CMP14+EGFR - CBC with Differential/Platelet  2. Diarrhea, unspecified type - CMP14+EGFR - CBC with  Differential/Platelet  3. Malignant neoplasm of left breast in female, estrogen receptor positive, unspecified site of breast ([place]) - CMP14+EGFR - CBC with Differential/Platelet   Labs pending Given duration worrisome that it is more than viral. However, pt does have breast cancer and is  immunosuppressed  Zofran as needed  Imodium as needed  Force fluids  Bland diet Follow up if symptoms worsen or do not improve   Evelina Dun, FNP

## 2022-01-05 NOTE — Patient Instructions (Signed)
Nausea and Vomiting, Adult Nausea is the feeling that you have an upset stomach or that you are about to vomit. As nausea gets worse, it can lead to vomiting. Vomiting is when stomach contents forcefully come out of your mouth as a result of nausea. Vomiting can make you feel weak and cause you to become dehydrated. Dehydration can make you feel tired and thirsty, cause you to have a dry mouth, and decrease how often you urinate. Older adults and people with other diseases or a weak disease-fighting system (immune system) are at higher risk for dehydration. It is important to treat your nausea and vomiting as told by your health care provider. Follow these instructions at home: Watch your symptoms for any changes. Tell your health care provider about them. Eating and drinking     Take an oral rehydration solution (ORS). This is a drink that is sold at pharmacies and retail stores. Drink clear fluids slowly and in small amounts as you are able. Clear fluids include water, ice chips, low-calorie sports drinks, and fruit juice that has water added (diluted fruit juice). Eat bland, easy-to-digest foods in small amounts as you are able. These foods include bananas, applesauce, rice, lean meats, toast, and crackers. Avoid fluids that contain a lot of sugar or caffeine, such as energy drinks, sports drinks, and soda. Avoid alcohol. Avoid spicy or fatty foods. General instructions Take over-the-counter and prescription medicines only as told by your health care provider. Drink enough fluid to keep your urine pale yellow. Wash your hands often using soap and water for at least 20 seconds. If soap and water are not available, use hand sanitizer. Make sure that everyone in your household washes their hands well and often. Rest at home while you recover. Watch your condition for any changes. Take slow and deep breaths when you feel nauseous. Keep all follow-up visits. This is important. Contact a health  care provider if: Your symptoms get worse. You have new symptoms. You have a fever. You cannot drink fluids without vomiting. Your nausea does not go away after 2 days. You feel light-headed or dizzy. You have a headache. You have muscle cramps. You have a rash. You have pain while urinating. Get help right away if: You have pain in your chest, neck, arm, or jaw. You feel extremely weak or you faint. You have persistent vomiting. You have vomit that is bright red or looks like black coffee grounds. You have bloody or black stools (feces) or stools that look like tar. You have a severe headache, a stiff neck, or both. You have severe pain, cramping, or bloating in your abdomen. You have difficulty breathing, or you are breathing very quickly. Your heart is beating very quickly. Your skin feels cold and clammy. You feel confused. You have signs of dehydration, such as: Dark urine, very little urine, or no urine. Cracked lips. Dry mouth. Sunken eyes. Sleepiness. Weakness. These symptoms may be an emergency. Get help right away. Call 911. Do not wait to see if the symptoms will go away. Do not drive yourself to the hospital. Summary Nausea is the feeling that you have an upset stomach or that you are about to vomit. As nausea gets worse, it can lead to vomiting. Vomiting can make you feel weak and cause you to become dehydrated. Follow instructions from your health care provider about eating and drinking to prevent dehydration. Take over-the-counter and prescription medicines only as told by your health care provider. Contact your health care   provider if your symptoms get worse, or you have new symptoms. Keep all follow-up visits. This is important. This information is not intended to replace advice given to you by your health care provider. Make sure you discuss any questions you have with your health care provider. Document Revised: 12/12/2020 Document Reviewed:  12/12/2020 Elsevier Patient Education  2023 [employer] Inc[employer]  

## 2022-01-06 LAB — CBC WITH DIFFERENTIAL/PLATELET
Basophils Absolute: 0 10*3/uL (ref 0.0–0.2)
Basos: 0 %
EOS (ABSOLUTE): 0 10*3/uL (ref 0.0–0.4)
Eos: 1 %
Hematocrit: 37.2 % (ref 34.0–46.6)
Hemoglobin: 13.4 g/dL (ref 11.1–15.9)
Immature Grans (Abs): 0 10*3/uL (ref 0.0–0.1)
Immature Granulocytes: 0 %
Lymphocytes Absolute: 1.1 10*3/uL (ref 0.7–3.1)
Lymphs: 20 %
MCH: 33.2 pg — ABNORMAL HIGH (ref 26.6–33.0)
MCHC: 36 g/dL — ABNORMAL HIGH (ref 31.5–35.7)
MCV: 92 fL (ref 79–97)
Monocytes Absolute: 0.6 10*3/uL (ref 0.1–0.9)
Monocytes: 11 %
Neutrophils Absolute: 3.8 10*3/uL (ref 1.4–7.0)
Neutrophils: 68 %
Platelets: 361 10*3/uL (ref 150–450)
RBC: 4.04 x10E6/uL (ref 3.77–5.28)
RDW: 13.9 % (ref 11.7–15.4)
WBC: 5.5 10*3/uL (ref 3.4–10.8)

## 2022-01-06 LAB — CMP14+EGFR
ALT: 17 IU/L (ref 0–32)
AST: 34 IU/L (ref 0–40)
Albumin/Globulin Ratio: 2.5 — ABNORMAL HIGH (ref 1.2–2.2)
Albumin: 5.2 g/dL — ABNORMAL HIGH (ref 3.9–4.9)
Alkaline Phosphatase: 57 IU/L (ref 44–121)
BUN/Creatinine Ratio: 17 (ref 12–28)
BUN: 10 mg/dL (ref 8–27)
Bilirubin Total: 0.9 mg/dL (ref 0.0–1.2)
CO2: 18 mmol/L — ABNORMAL LOW (ref 20–29)
Calcium: 9.6 mg/dL (ref 8.7–10.3)
Chloride: 74 mmol/L — CL (ref 96–106)
Creatinine, Ser: 0.6 mg/dL (ref 0.57–1.00)
Globulin, Total: 2.1 g/dL (ref 1.5–4.5)
Glucose: 58 mg/dL — ABNORMAL LOW (ref 70–99)
Potassium: 4.2 mmol/L (ref 3.5–5.2)
Sodium: 113 mmol/L — CL (ref 134–144)
Total Protein: 7.3 g/dL (ref 6.0–8.5)
eGFR: 100 mL/min/{1.73_m2} (ref >59)

## 2022-01-07 ENCOUNTER — Other Ambulatory Visit: Payer: Self-pay | Admitting: *Deleted

## 2022-01-07 DIAGNOSIS — E860 Dehydration: Secondary | ICD-10-CM

## 2022-01-08 ENCOUNTER — Other Ambulatory Visit: Payer: Medicare Other

## 2022-01-08 DIAGNOSIS — E860 Dehydration: Secondary | ICD-10-CM

## 2022-01-08 LAB — CMP14+EGFR
ALT: 17 IU/L (ref 0–32)
AST: 27 IU/L (ref 0–40)
Albumin/Globulin Ratio: 2.6 — ABNORMAL HIGH (ref 1.2–2.2)
Albumin: 4.9 g/dL (ref 3.9–4.9)
Alkaline Phosphatase: 50 IU/L (ref 44–121)
BUN/Creatinine Ratio: 9 — ABNORMAL LOW (ref 12–28)
BUN: 9 mg/dL (ref 8–27)
Bilirubin Total: 0.7 mg/dL (ref 0.0–1.2)
CO2: 23 mmol/L (ref 20–29)
Calcium: 9.7 mg/dL (ref 8.7–10.3)
Chloride: 83 mmol/L — ABNORMAL LOW (ref 96–106)
Creatinine, Ser: 0.97 mg/dL (ref 0.57–1.00)
Globulin, Total: 1.9 g/dL (ref 1.5–4.5)
Glucose: 84 mg/dL (ref 70–99)
Potassium: 3.6 mmol/L (ref 3.5–5.2)
Sodium: 123 mmol/L — ABNORMAL LOW (ref 134–144)
Total Protein: 6.8 g/dL (ref 6.0–8.5)
eGFR: 65 mL/min/{1.73_m2} (ref >59)

## 2022-01-23 ENCOUNTER — Other Ambulatory Visit: Payer: Self-pay

## 2022-01-23 ENCOUNTER — Inpatient Hospital Stay (HOSPITAL_COMMUNITY)
Admission: EM | Admit: 2022-01-23 | Discharge: 2022-01-25 | DRG: 866 | Disposition: A | Discharge status: Home / Self Care | Payer: Medicare Other | Attending: Internal Medicine | Admitting: Internal Medicine

## 2022-01-23 ENCOUNTER — Encounter (HOSPITAL_COMMUNITY): Payer: Self-pay | Admitting: Emergency Medicine

## 2022-01-23 DIAGNOSIS — Z90722 Acquired absence of ovaries, bilateral: Secondary | ICD-10-CM | POA: Diagnosis not present

## 2022-01-23 DIAGNOSIS — B349 Viral infection, unspecified: Secondary | ICD-10-CM | POA: Diagnosis not present

## 2022-01-23 DIAGNOSIS — E871 Hypo-osmolality and hyponatremia: Secondary | ICD-10-CM | POA: Diagnosis present

## 2022-01-23 DIAGNOSIS — R197 Diarrhea, unspecified: Secondary | ICD-10-CM | POA: Diagnosis present

## 2022-01-23 DIAGNOSIS — Z79899 Other long term (current) drug therapy: Secondary | ICD-10-CM

## 2022-01-23 DIAGNOSIS — Z803 Family history of malignant neoplasm of breast: Secondary | ICD-10-CM

## 2022-01-23 DIAGNOSIS — C50912 Malignant neoplasm of unspecified site of left female breast: Secondary | ICD-10-CM | POA: Diagnosis present

## 2022-01-23 DIAGNOSIS — Z87891 Personal history of nicotine dependence: Secondary | ICD-10-CM

## 2022-01-23 DIAGNOSIS — Z9079 Acquired absence of other genital organ(s): Secondary | ICD-10-CM

## 2022-01-23 DIAGNOSIS — E860 Dehydration: Secondary | ICD-10-CM | POA: Diagnosis not present

## 2022-01-23 DIAGNOSIS — Z880 Allergy status to penicillin: Secondary | ICD-10-CM | POA: Diagnosis not present

## 2022-01-23 DIAGNOSIS — Z886 Allergy status to analgesic agent status: Secondary | ICD-10-CM | POA: Diagnosis not present

## 2022-01-23 DIAGNOSIS — E782 Mixed hyperlipidemia: Secondary | ICD-10-CM | POA: Diagnosis not present

## 2022-01-23 DIAGNOSIS — R112 Nausea with vomiting, unspecified: Secondary | ICD-10-CM | POA: Diagnosis present

## 2022-01-23 DIAGNOSIS — Z79811 Long term (current) use of aromatase inhibitors: Secondary | ICD-10-CM

## 2022-01-23 DIAGNOSIS — Z170 Estrogen receptor positive status [ER+]: Secondary | ICD-10-CM | POA: Diagnosis not present

## 2022-01-23 DIAGNOSIS — I10 Essential (primary) hypertension: Secondary | ICD-10-CM | POA: Diagnosis not present

## 2022-01-23 DIAGNOSIS — K219 Gastro-esophageal reflux disease without esophagitis: Secondary | ICD-10-CM | POA: Diagnosis not present

## 2022-01-23 DIAGNOSIS — F32A Depression, unspecified: Secondary | ICD-10-CM | POA: Diagnosis present

## 2022-01-23 DIAGNOSIS — Z9221 Personal history of antineoplastic chemotherapy: Secondary | ICD-10-CM | POA: Diagnosis not present

## 2022-01-23 DIAGNOSIS — F419 Anxiety disorder, unspecified: Secondary | ICD-10-CM | POA: Diagnosis present

## 2022-01-23 DIAGNOSIS — Z9012 Acquired absence of left breast and nipple: Secondary | ICD-10-CM | POA: Diagnosis not present

## 2022-01-23 DIAGNOSIS — E876 Hypokalemia: Secondary | ICD-10-CM | POA: Diagnosis present

## 2022-01-23 LAB — COMPREHENSIVE METABOLIC PANEL
ALT: 25 U/L (ref 0–44)
AST: 31 U/L (ref 15–41)
Albumin: 4.6 g/dL (ref 3.5–5.0)
Alkaline Phosphatase: 40 U/L (ref 38–126)
Anion gap: 10 (ref 5–15)
BUN: 6 mg/dL — ABNORMAL LOW (ref 8–23)
CO2: 23 mmol/L (ref 22–32)
Calcium: 9.3 mg/dL (ref 8.9–10.3)
Chloride: 81 mmol/L — ABNORMAL LOW (ref 98–111)
Creatinine, Ser: 0.5 mg/dL (ref 0.44–1.00)
GFR, Estimated: >60 mL/min (ref >60)
Glucose, Bld: 84 mg/dL (ref 70–99)
Potassium: 4 mmol/L (ref 3.5–5.1)
Sodium: 114 mmol/L — CL (ref 135–145)
Total Bilirubin: 1.3 mg/dL — ABNORMAL HIGH (ref 0.3–1.2)
Total Protein: 7.3 g/dL (ref 6.5–8.1)

## 2022-01-23 LAB — CBC WITH DIFFERENTIAL/PLATELET
Abs Immature Granulocytes: 0.02 10*3/uL (ref 0.00–0.07)
Basophils Absolute: 0 10*3/uL (ref 0.0–0.1)
Basophils Relative: 0 %
Eosinophils Absolute: 0.1 10*3/uL (ref 0.0–0.5)
Eosinophils Relative: 3 %
HCT: 35 % — ABNORMAL LOW (ref 36.0–46.0)
Hemoglobin: 13.1 g/dL (ref 12.0–15.0)
Immature Granulocytes: 0 %
Lymphocytes Relative: 23 %
Lymphs Abs: 1.1 10*3/uL (ref 0.7–4.0)
MCH: 33.3 pg (ref 26.0–34.0)
MCHC: 37.4 g/dL — ABNORMAL HIGH (ref 30.0–36.0)
MCV: 89.1 fL (ref 80.0–100.0)
Monocytes Absolute: 0.5 10*3/uL (ref 0.1–1.0)
Monocytes Relative: 11 %
Neutro Abs: 3 10*3/uL (ref 1.7–7.7)
Neutrophils Relative %: 63 %
Platelets: 296 10*3/uL (ref 150–400)
RBC: 3.93 MIL/uL (ref 3.87–5.11)
RDW: 12.9 % (ref 11.5–15.5)
WBC: 4.7 10*3/uL (ref 4.0–10.5)
nRBC: 0 % (ref 0.0–0.2)

## 2022-01-23 LAB — OSMOLALITY, URINE: Osmolality, Ur: 387 mOsm/kg (ref 300–900)

## 2022-01-23 LAB — NA AND K (SODIUM & POTASSIUM), RAND UR
Potassium Urine: 44 mmol/L
Sodium, Ur: 109 mmol/L

## 2022-01-23 LAB — OSMOLALITY: Osmolality: 236 mOsm/kg — CL (ref 275–295)

## 2022-01-23 LAB — LIPASE, BLOOD: Lipase: 39 U/L (ref 11–51)

## 2022-01-23 LAB — PHOSPHORUS: Phosphorus: 3.1 mg/dL (ref 2.5–4.6)

## 2022-01-23 LAB — MAGNESIUM: Magnesium: 1.7 mg/dL (ref 1.7–2.4)

## 2022-01-23 MED ORDER — ANASTROZOLE 1 MG PO TABS
1.0000 mg | ORAL_TABLET | Freq: Every day | ORAL | Status: DC
  Administered 2022-01-23 – 2022-01-25 (×3): 1 mg via ORAL
  Filled 2022-01-23 (×5): qty 1

## 2022-01-23 MED ORDER — SODIUM CHLORIDE 0.9 % IV SOLN: INTRAVENOUS | Status: DC

## 2022-01-23 MED ORDER — MELATONIN 3 MG PO TABS
6.0000 mg | ORAL_TABLET | Freq: Every day | ORAL | Status: DC
  Administered 2022-01-24: 6 mg via ORAL
  Filled 2022-01-23 (×2): qty 2

## 2022-01-23 MED ORDER — ATORVASTATIN CALCIUM 20 MG PO TABS
20.0000 mg | ORAL_TABLET | Freq: Every day | ORAL | Status: DC
  Administered 2022-01-23 – 2022-01-25 (×3): 20 mg via ORAL
  Filled 2022-01-23 (×3): qty 1

## 2022-01-23 MED ORDER — ONDANSETRON HCL 4 MG/2ML IJ SOLN: 4.0000 mg | Freq: Four times a day (QID) | INTRAMUSCULAR | Status: DC | PRN

## 2022-01-23 MED ORDER — KETOROLAC TROMETHAMINE 30 MG/ML IJ SOLN
15.0000 mg | Freq: Once | INTRAMUSCULAR | Status: AC
  Administered 2022-01-23: 15 mg via INTRAVENOUS
  Filled 2022-01-23: qty 1

## 2022-01-23 MED ORDER — FLUOXETINE HCL 20 MG PO CAPS
20.0000 mg | ORAL_CAPSULE | Freq: Every day | ORAL | Status: DC
  Administered 2022-01-23 – 2022-01-25 (×3): 20 mg via ORAL
  Filled 2022-01-23 (×3): qty 1

## 2022-01-23 MED ORDER — ACETAMINOPHEN 650 MG RE SUPP: 650.0000 mg | Freq: Four times a day (QID) | RECTAL | Status: DC | PRN

## 2022-01-23 MED ORDER — SODIUM CHLORIDE 0.9% FLUSH
3.0000 mL | Freq: Two times a day (BID) | INTRAVENOUS | Status: DC
  Administered 2022-01-24 – 2022-01-25 (×2): 3 mL via INTRAVENOUS

## 2022-01-23 MED ORDER — SODIUM CHLORIDE 0.9 % IV BOLUS
1000.0000 mL | Freq: Once | INTRAVENOUS | Status: AC
Start: 2022-01-23 — End: 2022-01-23
  Administered 2022-01-23: 1000 mL via INTRAVENOUS

## 2022-01-23 MED ORDER — ENOXAPARIN SODIUM 40 MG/0.4ML IJ SOSY
40.0000 mg | PREFILLED_SYRINGE | INTRAMUSCULAR | Status: DC
  Administered 2022-01-23 – 2022-01-25 (×3): 40 mg via SUBCUTANEOUS
  Filled 2022-01-23 (×3): qty 0.4

## 2022-01-23 MED ORDER — ONDANSETRON HCL 4 MG PO TABS
4.0000 mg | ORAL_TABLET | Freq: Four times a day (QID) | ORAL | Status: DC | PRN
  Administered 2022-01-24: 4 mg via ORAL
  Filled 2022-01-23: qty 1

## 2022-01-23 MED ORDER — SODIUM CHLORIDE 0.9% FLUSH: 3.0000 mL | INTRAVENOUS | Status: DC | PRN

## 2022-01-23 MED ORDER — ACETAMINOPHEN 325 MG PO TABS
650.0000 mg | ORAL_TABLET | Freq: Four times a day (QID) | ORAL | Status: DC | PRN
  Administered 2022-01-23 – 2022-01-24 (×3): 650 mg via ORAL
  Filled 2022-01-23 (×3): qty 2

## 2022-01-23 MED ORDER — GABAPENTIN 300 MG PO CAPS
300.0000 mg | ORAL_CAPSULE | Freq: Two times a day (BID) | ORAL | Status: DC
  Administered 2022-01-23 – 2022-01-25 (×5): 300 mg via ORAL
  Filled 2022-01-23 (×5): qty 1

## 2022-01-23 MED ORDER — PANTOPRAZOLE SODIUM 40 MG PO TBEC
40.0000 mg | DELAYED_RELEASE_TABLET | Freq: Every day | ORAL | Status: DC
  Administered 2022-01-23 – 2022-01-25 (×3): 40 mg via ORAL
  Filled 2022-01-23 (×3): qty 1

## 2022-01-23 MED ORDER — LISINOPRIL 10 MG PO TABS
10.0000 mg | ORAL_TABLET | Freq: Every day | ORAL | Status: DC
  Administered 2022-01-23 – 2022-01-25 (×3): 10 mg via ORAL
  Filled 2022-01-23 (×3): qty 1

## 2022-01-23 MED ORDER — HYDRALAZINE HCL 20 MG/ML IJ SOLN: 10.0000 mg | Freq: Four times a day (QID) | INTRAMUSCULAR | Status: DC | PRN

## 2022-01-23 MED ORDER — SODIUM CHLORIDE 0.9 % IV SOLN: 250.0000 mL | INTRAVENOUS | Status: DC | PRN

## 2022-01-23 MED ORDER — HYDROCODONE-ACETAMINOPHEN 5-325 MG PO TABS
1.0000 | ORAL_TABLET | Freq: Four times a day (QID) | ORAL | Status: DC | PRN
  Administered 2022-01-23 – 2022-01-24 (×2): 1 via ORAL
  Filled 2022-01-23 (×2): qty 1

## 2022-01-23 NOTE — Progress Notes (Signed)
Latest Reference Range & Units 01/23/22 10:37  Osmolality 275 - 295 mOsm/kg 236 (LL)  (LL): Data is critically low.   A. Zierle-Ghosh notified about lab results. Waiting on new orders if any at this time.

## 2022-01-23 NOTE — H&P (Signed)
[facility] History and Physical   Patient: Samantha Mcfarland NID:[id number]   PCP: Loman Brooklyn, FNP DOB: [birth year]   DOA: 01/23/2022   DOS: 01/23/2022   DOS: the patient was seen and examined on 01/23/2022  Patient coming from: The patient is coming from Home  Chief Complaint: Nausea vomiting and diarrhea for 3 days, and headache  HPI: Tamorah Hada is a 66 y.o. adult with Past medical history of breast cancer s/p left mastectomy, last chemo was in April, HTN, HLD, depression as reviewed from EMR, presented [facility] ED with complaining of nausea vomiting and diarrhea for past 3 days.  Patient was also complaining of associated headache, dehydration and decreased p.o. intake.  Denies any abdominal pain, no chest pain or palpitation, no shortness of breath.  Patient had similar symptoms in the end of July which lasted for 9 days and then she was asymptomatic until 3 days ago.   ED Course:  Hypertensive recent BP 162/85, pulse 62, RR 17, saturating well on room air Sodium 114, chloride 81, BUN 6, CBC within normal range, glucose 58 low  Due to hyponatremia and intractable nausea vomiting and diarrhea patient is being admitted for further management as below.  Review of Systems: as mentioned in the history of present illness.  All other systems reviewed and are negative.  Past Medical History:  Diagnosis Date   Allergy    Anxiety    Breast cancer ([place])    Depression    Family history of breast cancer    Family history of colon cancer    Family history of pancreatic cancer    Family history of thyroid cancer    GERD (gastroesophageal reflux disease)    Hyperlipidemia    Hypertension    Osteopenia 04/30/2021   Port-A-Cath in place 05/07/2021   Vitamin D insufficiency    Past Surgical History:  Procedure Laterality Date   EXCISION OF SKIN TAG Right 05/27/2021   Procedure: EXCISION OF RIGHT SKIN TAG;  Surgeon: Virl Cagey, MD;  Location: [facility];  Service:  General;  Laterality: Right;   MASTECTOMY W/ SENTINEL NODE BIOPSY Left 03/23/2021   Procedure: TOTAL MASTECTOMY WITH SENTINEL LYMPH NODE BIOPSY;  Surgeon: Virl Cagey, MD;  Location: [facility];  Service: General;  Laterality: Left;   NECK SURGERY     OOPHORECTOMY     PORT-A-CATH REMOVAL Right 09/23/2021   Procedure: MINOR REMOVAL PORT-A-CATH;  Surgeon: Virl Cagey, MD;  Location: [facility];  Service: General;  Laterality: Right;   PORTACATH PLACEMENT Right 05/27/2021   Procedure: INSERTION PORT-A-CATH;  Surgeon: Virl Cagey, MD;  Location: [facility];  Service: General;  Laterality: Right;   SALPINGECTOMY     Social History:  reports that she has quit smoking. Her smoking use included cigarettes. She smoked an average of .25 packs per day. She has never used smokeless tobacco. She reports current alcohol use of about 20.0 standard drinks of alcohol per week. She reports that she does not use drugs.  Allergies  Allergen Reactions   Ibuprofen Other (See Comments)    "Stomach upset"   Penicillins     Allergic as a child. (Rash ???) Does not recall what type of reaction she has to drug.     Family history reviewed and not pertinent Family History  Problem Relation Age of Onset   Stroke Mother    Alcohol abuse Mother    Diabetes Father    Pancreatic cancer Father 55  Testicular cancer Father        dx 7s   Pancreatic disease Father    Breast cancer Maternal Aunt        dx 50s-60s   Breast cancer Paternal Aunt 22   Thyroid cancer Paternal Uncle    Breast cancer Maternal Grandmother        dx 59s   Colon cancer Paternal Grandfather        dx 75s     Prior to Admission medications   Medication Sig Start Date End Date Taking? Authorizing Provider  acetaminophen (TYLENOL) 325 MG tablet Take 325 mg by mouth every 6 (six) hours as needed for moderate pain or headache.    [provider]  anastrozole (ARIMIDEX) 1 MG tablet Take 1 tablet (1 mg total) by mouth daily.  08/27/21   Derek Jack, MD  atorvastatin (LIPITOR) 20 MG tablet Take 1 tablet (20 mg total) by mouth daily. (NEEDS TO BE SEEN BEFORE NEXT REFILL) 11/11/21   Loman Brooklyn, FNP  cholecalciferol (VITAMIN D3) 25 MCG (1000 UNIT) tablet Take 1,000 Units by mouth daily.    [provider]  FLUoxetine (PROZAC) 20 MG capsule Take 20 mg by mouth daily. 11/10/21   [provider]  gabapentin (NEURONTIN) 300 MG capsule Take 1 capsule (300 mg total) by mouth 2 (two) times daily. 08/27/21   Derek Jack, MD  HYDROcodone-acetaminophen (NORCO) 5-325 MG tablet Take 1 tablet by mouth at bedtime as needed for moderate pain. 12/04/21   Derek Jack, MD  lisinopril (ZESTRIL) 10 MG tablet Take 10 mg by mouth daily. 10/20/21   [provider]  omeprazole (PRILOSEC) 20 MG capsule Take 1 capsule (20 mg total) by mouth daily as needed (indigestion/heartburn). 05/01/21   Loman Brooklyn, FNP  ondansetron (ZOFRAN) 4 MG tablet Take 1 tablet (4 mg total) by mouth every 8 (eight) hours as needed for nausea or vomiting. 01/05/22   Evelina Dun A, FNP  oxyCODONE (OXY IR/ROXICODONE) 5 MG immediate release tablet Take 1 tablet (5 mg total) by mouth at bedtime as needed for severe pain. 12/03/21   Derek Jack, MD  vitamin B-12 (CYANOCOBALAMIN) 500 MCG tablet Take 500 mcg by mouth daily.    [provider]    Physical Exam: Vitals:   01/23/22 0812 01/23/22 0830 01/23/22 0900 01/23/22 0930  BP: (!) 148/93 (!) 148/83 (!) 149/89 (!) 148/128  Pulse: 64 (!) 59 63 64  Resp: '20 13 19 11  '$ Temp: 98 F (36.7 C)     TempSrc: Oral     SpO2: 100% 100% 100% 100%  Weight:      Height:        General: alert and oriented to time, place, and person. Appear in mild distress, affect appropriate Eyes: PERRLA, Conjunctiva normal ENT: Oral Mucosa Clear, dry  Neck: no JVD, no Abnormal Mass Or lumps Cardiovascular: S1 and S2 Present, no Murmur, peripheral pulses  symmetrical Respiratory: good respiratory effort, Bilateral Air entry equal and Decreased, no signs of accessory muscle use, Clear to Auscultation, no Crackles, no wheezes Abdomen: Bowel Sound present, Soft and no tenderness, no hernia Skin: no rashes  Extremities: no Pedal edema, no calf tenderness Neurologic: without any new focal findings Gait not checked due to patient safety concerns  Data Reviewed: I have personally reviewed and interpreted labs, imaging as discussed below.  CBC: Recent Labs  Lab 01/23/22 0859  WBC 4.7  NEUTROABS 3.0  HGB 13.1  HCT 35.0*  MCV 89.1  PLT 694   Basic Metabolic Panel: Recent Labs  Lab 01/23/22 0859  NA 114*  K 4.0  CL 81*  CO2 23  GLUCOSE 84  BUN 6*  CREATININE 0.50  CALCIUM 9.3   GFR: Estimated Creatinine Clearance (by C-G formula based on SCr of 0.5 mg/dL) Female: 58.8 mL/min Female: 69.1 mL/min Liver Function Tests: Recent Labs  Lab 01/23/22 0859  AST 31  ALT 25  ALKPHOS 40  BILITOT 1.3*  PROT 7.3  ALBUMIN 4.6   Recent Labs  Lab 01/23/22 0859  LIPASE 39   No results for input(s): "AMMONIA" in the last 168 hours. Coagulation Profile: No results for input(s): "INR", "PROTIME" in the last 168 hours. Cardiac Enzymes: No results for input(s): "CKTOTAL", "CKMB", "CKMBINDEX", "TROPONINI" in the last 168 hours. BNP (last 3 results) No results for input(s): "PROBNP" in the last 8760 hours. HbA1C: No results for input(s): "HGBA1C" in the last 72 hours. CBG: No results for input(s): "GLUCAP" in the last 168 hours. Lipid Profile: No results for input(s): "CHOL", "HDL", "LDLCALC", "TRIG", "CHOLHDL", "LDLDIRECT" in the last 72 hours. Thyroid Function Tests: No results for input(s): "TSH", "T4TOTAL", "FREET4", "T3FREE", "THYROIDAB" in the last 72 hours. Anemia Panel: No results for input(s): "VITAMINB12", "FOLATE", "FERRITIN", "TIBC", "IRON", "RETICCTPCT" in the last 72 hours. Urine analysis: No results found for:  "COLORURINE", "APPEARANCEUR", "LABSPEC", "PHURINE", "GLUCOSEU", "HGBUR", "BILIRUBINUR", "KETONESUR", "PROTEINUR", "UROBILINOGEN", "NITRITE", "LEUKOCYTESUR"  Radiological Exams on Admission:  I reviewed all nursing notes, pharmacy notes, vitals, pertinent old records.  Assessment/Plan Principal Problem:   Hyponatremia   Hyponatremia secondary to intractable nausea vomiting and diarrhea Sodium level 114 on admission Correction target 8 to 10 mEq in 24 hours Started normal saline for hydration Added sodium level every 8 hourly Follow serum osmolality, urine osmolality and urine electrolytes Consider nephrology consult if no improvement in sodium   Intractable nausea vomiting and diarrhea, unknown cause Follow C. difficile and GI pathogen panel would have infectious cause Abdominal is benign, no need of imaging at this time Continue IV fluid for hydration Symptomatic treatment for nausea and vomiting Consider antidiarrheal once infectious cause ruled out   History of breast cancer s/p left mastectomy, last chemotherapy was in April Continue anastrozole Follow-up with oncologist as an outpatient   HTN, HLD and depression Resumed home medications Monitor BP and titrate medications accordingly.    Nutrition: Full liquid diet DVT Prophylaxis: Subcutaneous Lovenox  Advance goals of care discussion: Full code   Consults: None, May consider Nephro consult if no improvement in sodium level  Family Communication: family was present at bedside, at the time of interview.  Opportunity was given to ask question and all questions were answered satisfactorily.  Disposition: Admitted as inpatient, telemetry, med-surge unit. Likely to be discharged home, in 2-3 days.  I have discussed plan of care as described above with RN and patient/family.  Severity of Illness: The appropriate patient status for this patient is INPATIENT. Inpatient status is judged to be reasonable and necessary  in order to provide the required intensity of service to ensure the patient's safety. The patient's presenting symptoms, physical exam findings, and initial radiographic and laboratory data in the context of their chronic comorbidities is felt to place them at high risk for further clinical deterioration. Furthermore, it is not anticipated that the patient will be medically stable for discharge from the hospital within 2 midnights of admission.   * I certify that at the point of admission it is my clinical judgment that the  patient will require inpatient hospital care spanning beyond 2 midnights from the point of admission due to high intensity of service, high risk for further deterioration and high frequency of surveillance required.*   Author: Val Riles, MD [facility] 01/23/2022 10:36 AM   To reach On-call, see care teams to locate the attending and reach out to them via [email or web address]. If 7PM-7AM, please contact night-coverage If you still have difficulty reaching the attending provider, please page the [facility] (Director on Call) for [facility] on amion for assistance.

## 2022-01-23 NOTE — ED Provider Notes (Signed)
[facility] EMERGENCY DEPARTMENT Provider Note   CSN: [id number] Arrival date & time: 01/23/22  5053     History  Chief Complaint  Patient presents with   Vomiting    Samantha Mcfarland is a 66 y.o. adult.  Patient with history of hypertension and breast cancer.  She states she has been having diarrhea for 3 days and some nausea and vomiting.  Patient complains of headache and fatigue now  The history is provided by the patient and medical records. No language interpreter was used.  Weakness Severity:  Moderate Onset quality:  Sudden Timing:  Constant Chronicity:  Recurrent Context: not alcohol use   Relieved by:  Nothing Worsened by:  Nothing Ineffective treatments:  None tried Associated symptoms: no abdominal pain, no chest pain, no cough, no diarrhea, no frequency, no headaches and no seizures        Home Medications Prior to Admission medications   Medication Sig Start Date End Date Taking? Authorizing Provider  acetaminophen (TYLENOL) 325 MG tablet Take 325 mg by mouth every 6 (six) hours as needed for moderate pain or headache.    [provider]  anastrozole (ARIMIDEX) 1 MG tablet Take 1 tablet (1 mg total) by mouth daily. 08/27/21   Derek Jack, MD  atorvastatin (LIPITOR) 20 MG tablet Take 1 tablet (20 mg total) by mouth daily. (NEEDS TO BE SEEN BEFORE NEXT REFILL) 11/11/21   Loman Brooklyn, FNP  cholecalciferol (VITAMIN D3) 25 MCG (1000 UNIT) tablet Take 1,000 Units by mouth daily.    [provider]  FLUoxetine (PROZAC) 20 MG capsule Take 20 mg by mouth daily. 11/10/21   [provider]  gabapentin (NEURONTIN) 300 MG capsule Take 1 capsule (300 mg total) by mouth 2 (two) times daily. 08/27/21   Derek Jack, MD  HYDROcodone-acetaminophen (NORCO) 5-325 MG tablet Take 1 tablet by mouth at bedtime as needed for moderate pain. 12/04/21   Derek Jack, MD  lisinopril (ZESTRIL) 10 MG tablet Take 10 mg by mouth daily. 10/20/21    [provider]  omeprazole (PRILOSEC) 20 MG capsule Take 1 capsule (20 mg total) by mouth daily as needed (indigestion/heartburn). 05/01/21   Loman Brooklyn, FNP  ondansetron (ZOFRAN) 4 MG tablet Take 1 tablet (4 mg total) by mouth every 8 (eight) hours as needed for nausea or vomiting. 01/05/22   Evelina Dun A, FNP  oxyCODONE (OXY IR/ROXICODONE) 5 MG immediate release tablet Take 1 tablet (5 mg total) by mouth at bedtime as needed for severe pain. 12/03/21   Derek Jack, MD  vitamin B-12 (CYANOCOBALAMIN) 500 MCG tablet Take 500 mcg by mouth daily.    [provider]      Allergies    Ibuprofen and Penicillins    Review of Systems   Review of Systems  Constitutional:  Negative for appetite change and fatigue.  HENT:  Negative for congestion, ear discharge and sinus pressure.   Eyes:  Negative for discharge.  Respiratory:  Negative for cough.   Cardiovascular:  Negative for chest pain.  Gastrointestinal:  Negative for abdominal pain and diarrhea.  Genitourinary:  Negative for frequency and hematuria.  Musculoskeletal:  Negative for back pain.  Skin:  Negative for rash.  Neurological:  Positive for weakness. Negative for seizures and headaches.  Psychiatric/Behavioral:  Negative for hallucinations.     Physical Exam Updated Vital Signs BP (!) 148/128   Pulse 64   Temp 98 F (36.7 C) (Oral)   Resp 11   Ht 5'  6" (1.676 m)   Wt 53.1 kg   SpO2 100%   BMI 18.88 kg/m  Physical Exam Vitals and nursing note reviewed.  Constitutional:      Appearance: She is well-developed.  HENT:     Head: Normocephalic.     Nose: Nose normal.  Eyes:     General: No scleral icterus.    Conjunctiva/sclera: Conjunctivae normal.  Neck:     Thyroid: No thyromegaly.  Cardiovascular:     Rate and Rhythm: Normal rate and regular rhythm.     Heart sounds: No murmur heard.    No friction rub. No gallop.  Pulmonary:     Breath sounds: No stridor. No wheezing or rales.   Chest:     Chest wall: No tenderness.  Abdominal:     General: There is no distension.     Tenderness: There is no abdominal tenderness. There is no rebound.  Musculoskeletal:        General: Normal range of motion.     Cervical back: Neck supple.  Lymphadenopathy:     Cervical: No cervical adenopathy.  Skin:    Findings: No erythema or rash.  Neurological:     Mental Status: She is alert and oriented to person, place, and time.     Motor: No abnormal muscle tone.     Coordination: Coordination normal.  Psychiatric:        Behavior: Behavior normal.     ED Results / Procedures / Treatments   Labs (all labs ordered are listed, but only abnormal results are displayed) Labs Reviewed  CBC WITH DIFFERENTIAL/PLATELET - Abnormal; Notable for the following components:      Result Value   HCT 35.0 (*)    MCHC 37.4 (*)    All other components within normal limits  COMPREHENSIVE METABOLIC PANEL - Abnormal; Notable for the following components:   Sodium 114 (*)    Chloride 81 (*)    BUN 6 (*)    Total Bilirubin 1.3 (*)    All other components within normal limits  LIPASE, BLOOD  OSMOLALITY  OSMOLALITY, URINE  NA AND K (SODIUM & POTASSIUM), RAND UR  MAGNESIUM  PHOSPHORUS    EKG None  Radiology No results found.  Procedures Procedures    Medications Ordered in ED Medications  sodium chloride 0.9 % bolus 1,000 mL (1,000 mLs Intravenous New Bag/Given 01/23/22 0857)  ketorolac (TORADOL) 30 MG/ML injection 15 mg (15 mg Intravenous Given 01/23/22 5462)    ED Course/ Medical Decision Making/ A&P  ,edcr CRITICAL CARE Performed by: Milton Ferguson Total critical care time: 40 minutes Critical care time was exclusive of separately billable procedures and treating other patients. Critical care was necessary to treat or prevent imminent or life-threatening deterioration. Critical care was time spent personally by me on the following activities: development of treatment plan with  patient and/or surrogate as well as nursing, discussions with consultants, evaluation of patient's response to treatment, examination of patient, obtaining history from patient or surrogate, ordering and performing treatments and interventions, ordering and review of laboratory studies, ordering and review of radiographic studies, pulse oximetry and re-evaluation of patient's condition.                          Medical Decision Making Amount and/or Complexity of Data Reviewed Labs: ordered.  Risk Prescription drug management. Decision regarding hospitalization.  This patient presents to the ED for concern of weakness, this involves an extensive number  of treatment options, and is a complaint that carries with it a high risk of complications and morbidity.  The differential diagnosis includes dehydration, MI   Co morbidities that complicate the patient evaluation  Hypertension   Additional history obtained:  Additional history obtained from friend External records from outside source obtained and reviewed including hospital records   Lab Tests:  I Ordered, and personally interpreted labs.  The pertinent results include: Sodium 114   Imaging Studies ordered:  No imaging  Cardiac Monitoring: / EKG:  The patient was maintained on a cardiac monitor.  I personally viewed and interpreted the cardiac monitored which showed an underlying rhythm of: Normal sinus rhythm   Consultations Obtained:  I requested consultation with the hospitalist,  and discussed lab and imaging findings as well as pertinent plan - they recommend: Admit   Problem List / ED Course / Critical interventions / Medication management  Hyponatremia and hypertension I ordered medication including normal saline for dehydration Reevaluation of the patient after these medicines showed that the patient improved I have reviewed the patients home medicines and have made adjustments as needed   Social  Determinants of Health:  None   Test / Admission - Considered:  No additional test considered  Patient with dehydration and hyponatremia.  She will be admitted to medicine        Final Clinical Impression(s) / ED Diagnoses Final diagnoses:  Hyponatremia    Rx / DC Orders ED Discharge Orders     None         Milton Ferguson, MD 01/23/22 1649

## 2022-01-23 NOTE — ED Triage Notes (Signed)
Patient c/o nausea, vomiting, and diarrhea x3 days with headache. Denies any known fevers. Denies any urinary symptoms. Patient recently seen for similar symptoms at PCP last week per family and given Zofran and imodium in which symptoms stopped but returned this week. Patient reports taking Zofran this morning at 5am with some improvement. Denies taking any imodium. Per patient emesis is "just fluid." Patient has hx of breast cancer per family and last chemo treatment was in April of this year.

## 2022-01-23 NOTE — Progress Notes (Signed)
Date and time results received: 01/23/22 1735    Test: Na+ Critical Value: 113  Name of Provider Notified: Dr. Dwyane Dee  Orders Received? Or Actions Taken?: Actions Taken: See new orders.

## 2022-01-24 DIAGNOSIS — E782 Mixed hyperlipidemia: Secondary | ICD-10-CM | POA: Diagnosis not present

## 2022-01-24 DIAGNOSIS — E876 Hypokalemia: Secondary | ICD-10-CM | POA: Diagnosis not present

## 2022-01-24 DIAGNOSIS — I10 Essential (primary) hypertension: Secondary | ICD-10-CM

## 2022-01-24 DIAGNOSIS — E871 Hypo-osmolality and hyponatremia: Secondary | ICD-10-CM | POA: Diagnosis not present

## 2022-01-24 DIAGNOSIS — R112 Nausea with vomiting, unspecified: Secondary | ICD-10-CM | POA: Diagnosis present

## 2022-01-24 LAB — BASIC METABOLIC PANEL
Anion gap: 5 (ref 5–15)
Anion gap: 4 — ABNORMAL LOW (ref 5–15)
BUN: 5 mg/dL — ABNORMAL LOW (ref 8–23)
BUN: <5 mg/dL — ABNORMAL LOW (ref 8–23)
CO2: 22 mmol/L (ref 22–32)
CO2: 21 mmol/L — ABNORMAL LOW (ref 22–32)
Calcium: 8 mg/dL — ABNORMAL LOW (ref 8.9–10.3)
Calcium: 8.1 mg/dL — ABNORMAL LOW (ref 8.9–10.3)
Chloride: 91 mmol/L — ABNORMAL LOW (ref 98–111)
Chloride: 91 mmol/L — ABNORMAL LOW (ref 98–111)
Creatinine, Ser: 0.42 mg/dL — ABNORMAL LOW (ref 0.44–1.00)
Creatinine, Ser: 0.4 mg/dL — ABNORMAL LOW (ref 0.44–1.00)
GFR, Estimated: >60 mL/min (ref >60)
GFR, Estimated: >60 mL/min (ref >60)
Glucose, Bld: 83 mg/dL (ref 70–99)
Glucose, Bld: 115 mg/dL — ABNORMAL HIGH (ref 70–99)
Potassium: 3 mmol/L — ABNORMAL LOW (ref 3.5–5.1)
Potassium: 3.6 mmol/L (ref 3.5–5.1)
Sodium: 118 mmol/L — CL (ref 135–145)
Sodium: 116 mmol/L — CL (ref 135–145)

## 2022-01-24 LAB — CBC
HCT: 30.2 % — ABNORMAL LOW (ref 36.0–46.0)
Hemoglobin: 11 g/dL — ABNORMAL LOW (ref 12.0–15.0)
MCH: 33 pg (ref 26.0–34.0)
MCHC: 36.4 g/dL — ABNORMAL HIGH (ref 30.0–36.0)
MCV: 90.7 fL (ref 80.0–100.0)
Platelets: 264 10*3/uL (ref 150–400)
RBC: 3.33 MIL/uL — ABNORMAL LOW (ref 3.87–5.11)
RDW: 13.2 % (ref 11.5–15.5)
WBC: 4.7 10*3/uL (ref 4.0–10.5)
nRBC: 0 % (ref 0.0–0.2)

## 2022-01-24 LAB — PHOSPHORUS: Phosphorus: 3.2 mg/dL (ref 2.5–4.6)

## 2022-01-24 LAB — MAGNESIUM: Magnesium: 1.5 mg/dL — ABNORMAL LOW (ref 1.7–2.4)

## 2022-01-24 MED ORDER — MAGNESIUM SULFATE 4 GM/100ML IV SOLN
4.0000 g | Freq: Once | INTRAVENOUS | Status: AC
  Administered 2022-01-24: 4 g via INTRAVENOUS
  Filled 2022-01-24: qty 100

## 2022-01-24 MED ORDER — POTASSIUM CHLORIDE IN NACL 40-0.9 MEQ/L-% IV SOLN: INTRAVENOUS | Status: DC

## 2022-01-24 MED ORDER — POTASSIUM CHLORIDE CRYS ER 20 MEQ PO TBCR
40.0000 meq | EXTENDED_RELEASE_TABLET | ORAL | Status: AC
  Administered 2022-01-24 (×2): 40 meq via ORAL
  Filled 2022-01-24 (×2): qty 2

## 2022-01-24 MED ORDER — OXYCODONE HCL 5 MG PO TABS
5.0000 mg | ORAL_TABLET | Freq: Four times a day (QID) | ORAL | Status: DC | PRN
  Administered 2022-01-24 – 2022-01-25 (×4): 5 mg via ORAL
  Filled 2022-01-24 (×4): qty 1

## 2022-01-24 NOTE — Progress Notes (Signed)
Date and time results received: 01/24/22  1037 (use smartphrase ".now" to insert current time)  Test: CMP Critical Value: 116 sodium   Name of Provider Notified: Dr, Roderic Palau [id number]  Orders Received? Or Actions Taken?: No new orders at this time

## 2022-01-24 NOTE — Progress Notes (Signed)
PROGRESS NOTE    Samantha Mcfarland  TXM:[id number] DOB: 01-Sep-1955 DOA: 01/23/2022 PCP: Loman Brooklyn, FNP    Brief Narrative:  66 year old female with a history of breast cancer status post left mastectomy, admitted to the hospital with nausea, vomiting and diarrhea.  Noted to have acute on chronic hyponatremia.  Also found to have hypokalemia.  Started on IV fluid rehydration and electrolyte correction.   Assessment & Plan:   Principal Problem:   Hyponatremia Active Problems:   Breast cancer, left (HCC)   Essential hypertension   Mixed hyperlipidemia   Hypokalemia   Nausea vomiting and diarrhea   Hyponatremia -Suspect secondary to GI losses due to intractable nausea, vomiting and diarrhea -Serum sodium of 114 on admission -Started on saline infusion -Overall serum sodium is trending up -She does appear to have some degree of chronic hyponatremia with baseline sodium running in the high 120s to low 130s  Hypokalemia -Replace  Intractable nausea, vomiting and diarrhea -Suspect this may have been a viral illness -Stool studies been ordered, but sample still has to be collected -Sample from this morning reportedly contained urine and therefore was not sent  History of breast cancer -Continue on anastrozole   DVT prophylaxis: enoxaparin (LOVENOX) injection 40 mg Start: 01/23/22 1230 SCDs Start: 01/23/22 1136  Code Status: Full code Family Communication: No family present Disposition Plan: Status is: Inpatient Remains inpatient appropriate because: Continues to have abnormal electrolytes needing correction     Consultants:    Procedures:    Antimicrobials:      Subjective: She says she is feeling better today.  She did have 1 loose stool this morning.  No vomiting.  Objective: Vitals:   01/23/22 2147 01/24/22 0201 01/24/22 0612 01/24/22 1337  BP: (!) 98/58 127/79 130/83 123/63  Pulse: (!) 56 (!) 58 69 64  Resp: '20 18 18 18  '$ Temp: 98.2 F (36.8 C) 98.2  F (36.8 C) 98.8 F (37.1 C) 98.2 F (36.8 C)  TempSrc:  Oral Oral Oral  SpO2: 99% 99% 100% 100%  Weight:      Height:        Intake/Output Summary (Last 24 hours) at 01/24/2022 1944 Last data filed at 01/24/2022 1700 Gross per 24 hour  Intake 1585.33 ml  Output 1900 ml  Net -314.67 ml   Filed Weights   01/23/22 0807 01/23/22 1143  Weight: 53.1 kg 50.9 kg    Examination:  General exam: Appears calm and comfortable  Respiratory system: Clear to auscultation. Respiratory effort normal. Cardiovascular system: S1 & S2 heard, RRR. No JVD, murmurs, rubs, gallops or clicks. No pedal edema. Gastrointestinal system: Abdomen is nondistended, soft and nontender. No organomegaly or masses felt. Normal bowel sounds heard. Central nervous system: Alert and oriented. No focal neurological deficits. Extremities: Symmetric 5 x 5 power. Skin: No rashes, lesions or ulcers Psychiatry: Judgement and insight appear normal. Mood & affect appropriate.     Data Reviewed: I have personally reviewed following labs and imaging studies  CBC: Recent Labs  Lab 01/23/22 0859 01/24/22 0040  WBC 4.7 4.7  NEUTROABS 3.0  --   HGB 13.1 11.0*  HCT 35.0* 30.2*  MCV 89.1 90.7  PLT 296 482   Basic Metabolic Panel: Recent Labs  Lab 01/23/22 0859 01/23/22 1037 01/23/22 1635 01/24/22 0040 01/24/22 0918  NA 114*  --  113* 118* 116*  K 4.0  --  3.0* 3.0* 3.6  CL 81*  --  87* 91* 91*  CO2 23  --  20* 22 21*  GLUCOSE 84  --  98 83 115*  BUN 6*  --  5* 5* <5*  CREATININE 0.50  --  0.45 0.42* 0.40*  CALCIUM 9.3  --  7.9* 8.0* 8.1*  MG  --  1.7  --  1.5*  --   PHOS  --  3.1  --  3.2  --    GFR: Estimated Creatinine Clearance (by C-G formula based on SCr of 0.4 mg/dL (L)) Female: 56.3 mL/min (A) Female: 66.3 mL/min (A) Liver Function Tests: Recent Labs  Lab 01/23/22 0859  AST 31  ALT 25  ALKPHOS 40  BILITOT 1.3*  PROT 7.3  ALBUMIN 4.6   Recent Labs  Lab 01/23/22 0859  LIPASE 39   No  results for input(s): "AMMONIA" in the last 168 hours. Coagulation Profile: No results for input(s): "INR", "PROTIME" in the last 168 hours. Cardiac Enzymes: No results for input(s): "CKTOTAL", "CKMB", "CKMBINDEX", "TROPONINI" in the last 168 hours. BNP (last 3 results) No results for input(s): "PROBNP" in the last 8760 hours. HbA1C: No results for input(s): "HGBA1C" in the last 72 hours. CBG: No results for input(s): "GLUCAP" in the last 168 hours. Lipid Profile: No results for input(s): "CHOL", "HDL", "LDLCALC", "TRIG", "CHOLHDL", "LDLDIRECT" in the last 72 hours. Thyroid Function Tests: No results for input(s): "TSH", "T4TOTAL", "FREET4", "T3FREE", "THYROIDAB" in the last 72 hours. Anemia Panel: No results for input(s): "VITAMINB12", "FOLATE", "FERRITIN", "TIBC", "IRON", "RETICCTPCT" in the last 72 hours. Sepsis Labs: No results for input(s): "PROCALCITON", "LATICACIDVEN" in the last 168 hours.  No results found for this or any previous visit (from the past 240 hour(s)).       Radiology Studies: No results found.      Scheduled Meds:  anastrozole  1 mg Oral Daily   atorvastatin  20 mg Oral Daily   enoxaparin (LOVENOX) injection  40 mg Subcutaneous Q24H   FLUoxetine  20 mg Oral Daily   gabapentin  300 mg Oral BID   lisinopril  10 mg Oral Daily   melatonin  6 mg Oral QHS   pantoprazole  40 mg Oral Daily   sodium chloride flush  3 mL Intravenous Q12H   Continuous Infusions:  sodium chloride     0.9 % NaCl with KCl 40 mEq / L 100 mL/hr at 01/24/22 1052     LOS: 1 day    Time spent: 48mns    JKathie Dike MD [facility]   If 7PM-7AM, please contact night-coverage [email or web address]  01/24/2022, 7:44 PM

## 2022-01-24 NOTE — Progress Notes (Signed)
  Transition of Care [facility]) Screening Note   Patient Details  Name: Samantha Mcfarland Date of Birth: 1955/08/18   Transition of Care [facility]) CM/SW Contact:    Iona Beard, [place] Phone Number: 01/24/2022, 11:42 AM    Transition of Care Department [facility]) has reviewed patient and no TOC needs have been identified at this time. We will continue to monitor patient advancement through interdisciplinary progression rounds. If new patient transition needs arise, please place a TOC consult.

## 2022-01-25 DIAGNOSIS — Z170 Estrogen receptor positive status [ER+]: Secondary | ICD-10-CM

## 2022-01-25 DIAGNOSIS — C50912 Malignant neoplasm of unspecified site of left female breast: Secondary | ICD-10-CM | POA: Diagnosis not present

## 2022-01-25 DIAGNOSIS — E876 Hypokalemia: Secondary | ICD-10-CM | POA: Diagnosis not present

## 2022-01-25 DIAGNOSIS — I10 Essential (primary) hypertension: Secondary | ICD-10-CM | POA: Diagnosis not present

## 2022-01-25 DIAGNOSIS — E871 Hypo-osmolality and hyponatremia: Secondary | ICD-10-CM | POA: Diagnosis not present

## 2022-01-25 LAB — CBC
HCT: 29.5 % — ABNORMAL LOW (ref 36.0–46.0)
Hemoglobin: 10.7 g/dL — ABNORMAL LOW (ref 12.0–15.0)
MCH: 33.9 pg (ref 26.0–34.0)
MCHC: 36.3 g/dL — ABNORMAL HIGH (ref 30.0–36.0)
MCV: 93.4 fL (ref 80.0–100.0)
Platelets: 269 10*3/uL (ref 150–400)
RBC: 3.16 MIL/uL — ABNORMAL LOW (ref 3.87–5.11)
RDW: 14.2 % (ref 11.5–15.5)
WBC: 2.7 10*3/uL — ABNORMAL LOW (ref 4.0–10.5)
nRBC: 0 % (ref 0.0–0.2)

## 2022-01-25 LAB — BASIC METABOLIC PANEL
Anion gap: 6 (ref 5–15)
Anion gap: 2 — ABNORMAL LOW (ref 5–15)
BUN: 5 mg/dL — ABNORMAL LOW (ref 8–23)
BUN: 5 mg/dL — ABNORMAL LOW (ref 8–23)
CO2: 20 mmol/L — ABNORMAL LOW (ref 22–32)
CO2: 22 mmol/L (ref 22–32)
Calcium: 7.9 mg/dL — ABNORMAL LOW (ref 8.9–10.3)
Calcium: 8.4 mg/dL — ABNORMAL LOW (ref 8.9–10.3)
Chloride: 87 mmol/L — ABNORMAL LOW (ref 98–111)
Chloride: 106 mmol/L (ref 98–111)
Creatinine, Ser: 0.45 mg/dL (ref 0.44–1.00)
Creatinine, Ser: 0.63 mg/dL (ref 0.44–1.00)
GFR, Estimated: >60 mL/min (ref >60)
GFR, Estimated: >60 mL/min (ref >60)
Glucose, Bld: 98 mg/dL (ref 70–99)
Glucose, Bld: 96 mg/dL (ref 70–99)
Potassium: 3 mmol/L — ABNORMAL LOW (ref 3.5–5.1)
Potassium: 4.7 mmol/L (ref 3.5–5.1)
Sodium: 113 mmol/L — CL (ref 135–145)
Sodium: 130 mmol/L — ABNORMAL LOW (ref 135–145)

## 2022-01-25 LAB — MAGNESIUM: Magnesium: 2.3 mg/dL (ref 1.7–2.4)

## 2022-01-25 LAB — PHOSPHORUS: Phosphorus: 2.3 mg/dL — ABNORMAL LOW (ref 2.5–4.6)

## 2022-01-25 MED ORDER — LORATADINE 10 MG PO TABS
10.0000 mg | ORAL_TABLET | Freq: Every day | ORAL | Status: DC | PRN
Start: 2022-01-25 — End: 2022-01-25
  Administered 2022-01-25: 10 mg via ORAL
  Filled 2022-01-25: qty 1

## 2022-01-25 NOTE — Progress Notes (Signed)
Patient stable this shift without issues. Patient rested well. Patient received pain meds during this shift for head pain. Patient using the BSC overnight. Still with continuous IVF.

## 2022-01-25 NOTE — Discharge Summary (Signed)
Physician Discharge Summary  Samantha Mcfarland TKZ:[id number] DOB: 1956-02-14 DOA: 01/23/2022  PCP: Loman Brooklyn, FNP  Admit date: 01/23/2022 Discharge date: 01/25/2022  Admitted From: Home Disposition: Home  Recommendations for Outpatient Follow-up:  Follow up with PCP in 1-2 weeks Please obtain BMP/CBC in one week  Discharge Condition: Stable CODE STATUS: Full code Diet recommendation: Heart healthy  Brief/Interim Summary: 66 year old female with a history of breast cancer status post left mastectomy, admitted to the hospital with nausea, vomiting and diarrhea.  Noted to have acute on chronic hyponatremia.  Also found to have hypokalemia.  Started on IV fluid rehydration and electrolyte correction  Discharge Diagnoses:  Principal Problem:   Hyponatremia Active Problems:   Breast cancer, left (HCC)   Essential hypertension   Mixed hyperlipidemia   Hypokalemia   Nausea vomiting and diarrhea  Hyponatremia -Suspect secondary to GI losses due to intractable nausea, vomiting and diarrhea -Serum sodium of 114 on admission -Started on saline infusion -Overall serum sodium is trending up -She does appear to have some degree of chronic hyponatremia with baseline sodium running in the high 120s to low 130s   Hypokalemia -Replaced   Intractable nausea, vomiting and diarrhea -Suspect this may have been a viral illness -Stool studies been ordered, but sample still has to be collected -Diarrhea, vomiting resolved while in the hospital and therefore samples were not sent   History of breast cancer -Continue on anastrozole  Discharge Instructions  Discharge Instructions     Diet - low sodium heart healthy   Complete by: As directed    Increase activity slowly   Complete by: As directed       Allergies as of 01/25/2022       Reactions   Ibuprofen Other (See Comments)   "Stomach upset"   Penicillins Rash        Medication List     STOP taking these medications     HYDROcodone-acetaminophen 5-325 MG tablet Commonly known as: Norco       TAKE these medications    acetaminophen 325 MG tablet Commonly known as: TYLENOL Take 325 mg by mouth every 6 (six) hours as needed for moderate pain or headache.   anastrozole 1 MG tablet Commonly known as: ARIMIDEX Take 1 tablet (1 mg total) by mouth daily.   atorvastatin 20 MG tablet Commonly known as: LIPITOR Take 1 tablet (20 mg total) by mouth daily. (NEEDS TO BE SEEN BEFORE NEXT REFILL)   cholecalciferol 25 MCG (1000 UNIT) tablet Commonly known as: VITAMIN D3 Take 1,000 Units by mouth daily.   cyanocobalamin 500 MCG tablet Commonly known as: VITAMIN B12 Take 500 mcg by mouth daily.   FLUoxetine 20 MG capsule Commonly known as: PROZAC Take 20 mg by mouth daily.   gabapentin 300 MG capsule Commonly known as: NEURONTIN Take 1 capsule (300 mg total) by mouth 2 (two) times daily.   lisinopril 10 MG tablet Commonly known as: ZESTRIL Take 10 mg by mouth daily.   omeprazole 20 MG capsule Commonly known as: PRILOSEC Take 1 capsule (20 mg total) by mouth daily as needed (indigestion/heartburn).   ondansetron 4 MG tablet Commonly known as: Zofran Take 1 tablet (4 mg total) by mouth every 8 (eight) hours as needed for nausea or vomiting.   oxyCODONE 5 MG immediate release tablet Commonly known as: Oxy IR/ROXICODONE Take 1 tablet (5 mg total) by mouth at bedtime as needed for severe pain.        Follow-up Information  Loman Brooklyn, FNP. Schedule an appointment as soon as possible for a visit in 2 week(s).   Specialty: Family Medicine Contact information: [place] [address] [phone]                Allergies  Allergen Reactions   Ibuprofen Other (See Comments)    "Stomach upset"   Penicillins Rash    Consultations:    Procedures/Studies: No results found.    Subjective: No further nausea, vomiting, diarrhea.  Tolerating  diet  Discharge Exam: Vitals:   01/24/22 1337 01/24/22 2058 01/25/22 0520 01/25/22 1217  BP: 123/63 116/69 120/72 108/73  Pulse: 64 (!) 59 66 (!) 57  Resp: '18 18 18 16  '$ Temp: 98.2 F (36.8 C) 98.4 F (36.9 C) 98.2 F (36.8 C) 98.3 F (36.8 C)  TempSrc: Oral Oral Oral Oral  SpO2: 100% 99% 100% 99%  Weight:      Height:        General: Pt is alert, awake, not in acute distress Cardiovascular: RRR, S1/S2 +, no rubs, no gallops Respiratory: CTA bilaterally, no wheezing, no rhonchi Abdominal: Soft, NT, ND, bowel sounds + Extremities: no edema, no cyanosis    The results of significant diagnostics from this hospitalization (including imaging, microbiology, ancillary and laboratory) are listed below for reference.     Microbiology: No results found for this or any previous visit (from the past 240 hour(s)).   Labs: BNP (last 3 results) No results for input(s): "BNP" in the last 8760 hours. Basic Metabolic Panel: Recent Labs  Lab 01/23/22 0859 01/23/22 1037 01/23/22 1635 01/24/22 0040 01/24/22 0918 01/25/22 0453  NA 114*  --  113* 118* 116* 130*  K 4.0  --  3.0* 3.0* 3.6 4.7  CL 81*  --  87* 91* 91* 106  CO2 23  --  20* 22 21* 22  GLUCOSE 84  --  98 83 115* 96  BUN 6*  --  5* 5* <5* 5*  CREATININE 0.50  --  0.45 0.42* 0.40* 0.63  CALCIUM 9.3  --  7.9* 8.0* 8.1* 8.4*  MG  --  1.7  --  1.5*  --  2.3  PHOS  --  3.1  --  3.2  --  2.3*   Liver Function Tests: Recent Labs  Lab 01/23/22 0859  AST 31  ALT 25  ALKPHOS 40  BILITOT 1.3*  PROT 7.3  ALBUMIN 4.6   Recent Labs  Lab 01/23/22 0859  LIPASE 39   No results for input(s): "AMMONIA" in the last 168 hours. CBC: Recent Labs  Lab 01/23/22 0859 01/24/22 0040 01/25/22 0453  WBC 4.7 4.7 2.7*  NEUTROABS 3.0  --   --   HGB 13.1 11.0* 10.7*  HCT 35.0* 30.2* 29.5*  MCV 89.1 90.7 93.4  PLT 296 264 269   Cardiac Enzymes: No results for input(s): "CKTOTAL", "CKMB", "CKMBINDEX", "TROPONINI" in the last 168  hours. BNP: Invalid input(s): "POCBNP" CBG: No results for input(s): "GLUCAP" in the last 168 hours. D-Dimer No results for input(s): "DDIMER" in the last 72 hours. Hgb A1c No results for input(s): "HGBA1C" in the last 72 hours. Lipid Profile No results for input(s): "CHOL", "HDL", "LDLCALC", "TRIG", "CHOLHDL", "LDLDIRECT" in the last 72 hours. Thyroid function studies No results for input(s): "TSH", "T4TOTAL", "T3FREE", "THYROIDAB" in the last 72 hours.  Invalid input(s): "FREET3" Anemia work up No results for input(s): "VITAMINB12", "FOLATE", "FERRITIN", "TIBC", "IRON", "RETICCTPCT" in the last 72 hours. Urinalysis No  results found for: "COLORURINE", "APPEARANCEUR", "LABSPEC", "PHURINE", "GLUCOSEU", "HGBUR", "BILIRUBINUR", "KETONESUR", "PROTEINUR", "UROBILINOGEN", "NITRITE", "LEUKOCYTESUR" Sepsis Labs Recent Labs  Lab 01/23/22 0859 01/24/22 0040 01/25/22 0453  WBC 4.7 4.7 2.7*   Microbiology No results found for this or any previous visit (from the past 240 hour(s)).   Time coordinating discharge: 70mns  SIGNED:   JKathie Dike MD  [facility] 01/25/2022, 7:30 PM   If 7PM-7AM, please contact night-coverage [email or web address]

## 2022-01-26 ENCOUNTER — Other Ambulatory Visit: Payer: Self-pay | Admitting: *Deleted

## 2022-01-26 NOTE — Patient Outreach (Signed)
  Care Coordination [facility] Note Transition Care Management Follow-up Telephone Call Date of discharge and from where: [phone] How have you been since you were released from the hospital? Feel good Any questions or concerns? Yes  Items Reviewed: Did the pt receive and understand the discharge instructions provided? Yes  Medications obtained and verified? Yes  Other? No  Any new allergies since your discharge? Yes  Dietary orders reviewed? Yes Do you have support at home? No   Home Care and Equipment/Supplies: Were home health services ordered? not applicable If so, what is the name of the agency? N   Has the agency set up a time to come to the patient's home? not applicable Were any new equipment or medical supplies ordered?  No What is the name of the medical supply agency? N  Were you able to get the supplies/equipment? not applicable Do you have any questions related to the use of the equipment or supplies? No  Functional Questionnaire: (I = Independent and D = Dependent) ADLs: I  Bathing/Dressing- I  Meal Prep- I  Eating- I  Maintaining continence- I  Transferring/Ambulation- I  Managing Meds- I  Follow up appointments reviewed:  PCP Hospital f/u appt confirmed? Yes  Scheduled to see Hendricks Limes  FNP on August 16 at 10:05 . [place] [facility] f/u appt confirmed? No   Are transportation arrangements needed? No  If their condition worsens, is the pt aware to call PCP or go to the Emergency Dept.? Yes Was the patient provided with contact information for the PCP's office or ED? Yes Was to pt encouraged to call back with questions or concerns? Yes  SDOH assessments and interventions completed:   Yes  Care Coordination Interventions Activated:  No   Care Coordination Interventions:   N     Encounter Outcome:  Pt. Visit Completed

## 2022-01-26 NOTE — Patient Outreach (Signed)
  Care Coordination [facility] Note Transition Care Management Unsuccessful Follow-up Telephone Call  Date of discharge and from where:  [facility] [phone]  Attempts:  1st Attempt  Reason for unsuccessful TCM follow-up call:  Left voice message  [facility] [phone]

## 2022-02-01 ENCOUNTER — Other Ambulatory Visit: Payer: Self-pay

## 2022-02-01 ENCOUNTER — Inpatient Hospital Stay (HOSPITAL_COMMUNITY)
Admission: EM | Admit: 2022-02-01 | Discharge: 2022-02-04 | DRG: 645 | Disposition: A | Discharge status: Home / Self Care | Payer: Medicare Other | Attending: Internal Medicine | Admitting: Internal Medicine

## 2022-02-01 ENCOUNTER — Encounter (HOSPITAL_COMMUNITY): Payer: Self-pay | Admitting: Emergency Medicine

## 2022-02-01 DIAGNOSIS — E222 Syndrome of inappropriate secretion of antidiuretic hormone: Principal | ICD-10-CM | POA: Diagnosis present

## 2022-02-01 DIAGNOSIS — Z886 Allergy status to analgesic agent status: Secondary | ICD-10-CM

## 2022-02-01 DIAGNOSIS — G2581 Restless legs syndrome: Secondary | ICD-10-CM | POA: Diagnosis present

## 2022-02-01 DIAGNOSIS — E871 Hypo-osmolality and hyponatremia: Secondary | ICD-10-CM | POA: Diagnosis not present

## 2022-02-01 DIAGNOSIS — R1110 Vomiting, unspecified: Secondary | ICD-10-CM | POA: Diagnosis not present

## 2022-02-01 DIAGNOSIS — Z9221 Personal history of antineoplastic chemotherapy: Secondary | ICD-10-CM

## 2022-02-01 DIAGNOSIS — Z880 Allergy status to penicillin: Secondary | ICD-10-CM

## 2022-02-01 DIAGNOSIS — R112 Nausea with vomiting, unspecified: Secondary | ICD-10-CM | POA: Diagnosis not present

## 2022-02-01 DIAGNOSIS — Z9012 Acquired absence of left breast and nipple: Secondary | ICD-10-CM | POA: Diagnosis not present

## 2022-02-01 DIAGNOSIS — R42 Dizziness and giddiness: Secondary | ICD-10-CM | POA: Diagnosis not present

## 2022-02-01 DIAGNOSIS — R0689 Other abnormalities of breathing: Secondary | ICD-10-CM | POA: Diagnosis not present

## 2022-02-01 DIAGNOSIS — I10 Essential (primary) hypertension: Secondary | ICD-10-CM | POA: Diagnosis present

## 2022-02-01 DIAGNOSIS — Z79811 Long term (current) use of aromatase inhibitors: Secondary | ICD-10-CM

## 2022-02-01 DIAGNOSIS — R52 Pain, unspecified: Secondary | ICD-10-CM | POA: Diagnosis not present

## 2022-02-01 DIAGNOSIS — Z87891 Personal history of nicotine dependence: Secondary | ICD-10-CM | POA: Diagnosis not present

## 2022-02-01 DIAGNOSIS — Z170 Estrogen receptor positive status [ER+]: Secondary | ICD-10-CM | POA: Diagnosis not present

## 2022-02-01 DIAGNOSIS — Z79899 Other long term (current) drug therapy: Secondary | ICD-10-CM | POA: Diagnosis not present

## 2022-02-01 DIAGNOSIS — I959 Hypotension, unspecified: Secondary | ICD-10-CM | POA: Diagnosis not present

## 2022-02-01 DIAGNOSIS — R197 Diarrhea, unspecified: Secondary | ICD-10-CM | POA: Diagnosis not present

## 2022-02-01 DIAGNOSIS — C50912 Malignant neoplasm of unspecified site of left female breast: Secondary | ICD-10-CM | POA: Diagnosis not present

## 2022-02-01 DIAGNOSIS — K219 Gastro-esophageal reflux disease without esophagitis: Secondary | ICD-10-CM | POA: Diagnosis present

## 2022-02-01 DIAGNOSIS — Z803 Family history of malignant neoplasm of breast: Secondary | ICD-10-CM | POA: Diagnosis not present

## 2022-02-01 DIAGNOSIS — E785 Hyperlipidemia, unspecified: Secondary | ICD-10-CM | POA: Diagnosis present

## 2022-02-01 MED ORDER — SODIUM CHLORIDE 0.9 % IV BOLUS
1000.0000 mL | Freq: Once | INTRAVENOUS | Status: AC
  Administered 2022-02-02: 1000 mL via INTRAVENOUS

## 2022-02-01 NOTE — ED Triage Notes (Signed)
Pt c/o n/v/d for a couple of day and had a couple of falls today.

## 2022-02-02 ENCOUNTER — Emergency Department (HOSPITAL_COMMUNITY): Payer: Medicare Other

## 2022-02-02 DIAGNOSIS — E785 Hyperlipidemia, unspecified: Secondary | ICD-10-CM | POA: Diagnosis present

## 2022-02-02 DIAGNOSIS — I10 Essential (primary) hypertension: Secondary | ICD-10-CM

## 2022-02-02 DIAGNOSIS — Z170 Estrogen receptor positive status [ER+]: Secondary | ICD-10-CM | POA: Diagnosis not present

## 2022-02-02 DIAGNOSIS — Z886 Allergy status to analgesic agent status: Secondary | ICD-10-CM | POA: Diagnosis not present

## 2022-02-02 DIAGNOSIS — K219 Gastro-esophageal reflux disease without esophagitis: Secondary | ICD-10-CM | POA: Diagnosis present

## 2022-02-02 DIAGNOSIS — Z880 Allergy status to penicillin: Secondary | ICD-10-CM | POA: Diagnosis not present

## 2022-02-02 DIAGNOSIS — Z79811 Long term (current) use of aromatase inhibitors: Secondary | ICD-10-CM | POA: Diagnosis not present

## 2022-02-02 DIAGNOSIS — Z9221 Personal history of antineoplastic chemotherapy: Secondary | ICD-10-CM | POA: Diagnosis not present

## 2022-02-02 DIAGNOSIS — R197 Diarrhea, unspecified: Secondary | ICD-10-CM | POA: Diagnosis not present

## 2022-02-02 DIAGNOSIS — R1110 Vomiting, unspecified: Secondary | ICD-10-CM

## 2022-02-02 DIAGNOSIS — R112 Nausea with vomiting, unspecified: Secondary | ICD-10-CM

## 2022-02-02 DIAGNOSIS — Z79899 Other long term (current) drug therapy: Secondary | ICD-10-CM | POA: Diagnosis not present

## 2022-02-02 DIAGNOSIS — R42 Dizziness and giddiness: Secondary | ICD-10-CM | POA: Diagnosis not present

## 2022-02-02 DIAGNOSIS — E222 Syndrome of inappropriate secretion of antidiuretic hormone: Secondary | ICD-10-CM | POA: Diagnosis not present

## 2022-02-02 DIAGNOSIS — Z803 Family history of malignant neoplasm of breast: Secondary | ICD-10-CM | POA: Diagnosis not present

## 2022-02-02 DIAGNOSIS — E871 Hypo-osmolality and hyponatremia: Secondary | ICD-10-CM

## 2022-02-02 DIAGNOSIS — C50912 Malignant neoplasm of unspecified site of left female breast: Secondary | ICD-10-CM | POA: Diagnosis not present

## 2022-02-02 DIAGNOSIS — G2581 Restless legs syndrome: Secondary | ICD-10-CM | POA: Diagnosis present

## 2022-02-02 DIAGNOSIS — Z87891 Personal history of nicotine dependence: Secondary | ICD-10-CM | POA: Diagnosis not present

## 2022-02-02 DIAGNOSIS — Z9012 Acquired absence of left breast and nipple: Secondary | ICD-10-CM | POA: Diagnosis not present

## 2022-02-02 LAB — URINALYSIS, ROUTINE W REFLEX MICROSCOPIC
Bacteria, UA: NONE SEEN
Bilirubin Urine: NEGATIVE
Glucose, UA: NEGATIVE mg/dL
Hgb urine dipstick: NEGATIVE
Ketones, ur: NEGATIVE mg/dL
Nitrite: NEGATIVE
Protein, ur: NEGATIVE mg/dL
Specific Gravity, Urine: 1.004 — ABNORMAL LOW (ref 1.005–1.030)
pH: 8 (ref 5.0–8.0)

## 2022-02-02 LAB — CBC
HCT: 29.9 % — ABNORMAL LOW (ref 36.0–46.0)
Hemoglobin: 10.9 g/dL — ABNORMAL LOW (ref 12.0–15.0)
MCH: 33.9 pg (ref 26.0–34.0)
MCHC: 36.5 g/dL — ABNORMAL HIGH (ref 30.0–36.0)
MCV: 92.9 fL (ref 80.0–100.0)
Platelets: 368 10*3/uL (ref 150–400)
RBC: 3.22 MIL/uL — ABNORMAL LOW (ref 3.87–5.11)
RDW: 13.5 % (ref 11.5–15.5)
WBC: 7.2 10*3/uL (ref 4.0–10.5)
nRBC: 0 % (ref 0.0–0.2)

## 2022-02-02 LAB — COMPREHENSIVE METABOLIC PANEL
ALT: 30 U/L (ref 0–44)
AST: 32 U/L (ref 15–41)
Albumin: 4.1 g/dL (ref 3.5–5.0)
Alkaline Phosphatase: 38 U/L (ref 38–126)
Anion gap: 9 (ref 5–15)
BUN: 6 mg/dL — ABNORMAL LOW (ref 8–23)
CO2: 23 mmol/L (ref 22–32)
Calcium: 8.4 mg/dL — ABNORMAL LOW (ref 8.9–10.3)
Chloride: 84 mmol/L — ABNORMAL LOW (ref 98–111)
Creatinine, Ser: 0.44 mg/dL (ref 0.44–1.00)
GFR, Estimated: >60 mL/min (ref >60)
Glucose, Bld: 92 mg/dL (ref 70–99)
Potassium: 4.7 mmol/L (ref 3.5–5.1)
Sodium: 116 mmol/L — CL (ref 135–145)
Total Bilirubin: 0.3 mg/dL (ref 0.3–1.2)
Total Protein: 6.7 g/dL (ref 6.5–8.1)

## 2022-02-02 LAB — BASIC METABOLIC PANEL
Anion gap: 6 (ref 5–15)
Anion gap: 6 (ref 5–15)
Anion gap: 8 (ref 5–15)
BUN: 6 mg/dL — ABNORMAL LOW (ref 8–23)
BUN: 5 mg/dL — ABNORMAL LOW (ref 8–23)
BUN: 5 mg/dL — ABNORMAL LOW (ref 8–23)
CO2: 23 mmol/L (ref 22–32)
CO2: 23 mmol/L (ref 22–32)
CO2: 21 mmol/L — ABNORMAL LOW (ref 22–32)
Calcium: 8.4 mg/dL — ABNORMAL LOW (ref 8.9–10.3)
Calcium: 8.6 mg/dL — ABNORMAL LOW (ref 8.9–10.3)
Calcium: 8.5 mg/dL — ABNORMAL LOW (ref 8.9–10.3)
Chloride: 89 mmol/L — ABNORMAL LOW (ref 98–111)
Chloride: 87 mmol/L — ABNORMAL LOW (ref 98–111)
Chloride: 88 mmol/L — ABNORMAL LOW (ref 98–111)
Creatinine, Ser: 0.45 mg/dL (ref 0.44–1.00)
Creatinine, Ser: 0.53 mg/dL (ref 0.44–1.00)
Creatinine, Ser: 0.5 mg/dL (ref 0.44–1.00)
GFR, Estimated: >60 mL/min (ref >60)
GFR, Estimated: >60 mL/min (ref >60)
GFR, Estimated: >60 mL/min (ref >60)
Glucose, Bld: 86 mg/dL (ref 70–99)
Glucose, Bld: 87 mg/dL (ref 70–99)
Glucose, Bld: 72 mg/dL (ref 70–99)
Potassium: 4.5 mmol/L (ref 3.5–5.1)
Potassium: 4.3 mmol/L (ref 3.5–5.1)
Potassium: 3.4 mmol/L — ABNORMAL LOW (ref 3.5–5.1)
Sodium: 118 mmol/L — CL (ref 135–145)
Sodium: 116 mmol/L — CL (ref 135–145)
Sodium: 117 mmol/L — CL (ref 135–145)

## 2022-02-02 LAB — TSH: TSH: 1.168 u[IU]/mL (ref 0.350–4.500)

## 2022-02-02 LAB — CBC WITH DIFFERENTIAL/PLATELET
Abs Immature Granulocytes: 0.05 10*3/uL (ref 0.00–0.07)
Basophils Absolute: 0 10*3/uL (ref 0.0–0.1)
Basophils Relative: 0 %
Eosinophils Absolute: 0.2 10*3/uL (ref 0.0–0.5)
Eosinophils Relative: 3 %
HCT: 30.8 % — ABNORMAL LOW (ref 36.0–46.0)
Hemoglobin: 11.1 g/dL — ABNORMAL LOW (ref 12.0–15.0)
Immature Granulocytes: 1 %
Lymphocytes Relative: 21 %
Lymphs Abs: 1 10*3/uL (ref 0.7–4.0)
MCH: 33.3 pg (ref 26.0–34.0)
MCHC: 36 g/dL (ref 30.0–36.0)
MCV: 92.5 fL (ref 80.0–100.0)
Monocytes Absolute: 0.5 10*3/uL (ref 0.1–1.0)
Monocytes Relative: 10 %
Neutro Abs: 3 10*3/uL (ref 1.7–7.7)
Neutrophils Relative %: 65 %
Platelets: 379 10*3/uL (ref 150–400)
RBC: 3.33 MIL/uL — ABNORMAL LOW (ref 3.87–5.11)
RDW: 13.6 % (ref 11.5–15.5)
WBC: 4.7 10*3/uL (ref 4.0–10.5)
nRBC: 0 % (ref 0.0–0.2)

## 2022-02-02 LAB — OSMOLALITY, URINE
Osmolality, Ur: 199 mOsm/kg — ABNORMAL LOW (ref 300–900)
Osmolality, Ur: 376 mOsm/kg (ref 300–900)

## 2022-02-02 LAB — MAGNESIUM: Magnesium: 1.7 mg/dL (ref 1.7–2.4)

## 2022-02-02 LAB — CORTISOL-AM, BLOOD: Cortisol - AM: 16.4 ug/dL (ref 6.7–22.6)

## 2022-02-02 LAB — LIPASE, BLOOD: Lipase: 41 U/L (ref 11–51)

## 2022-02-02 LAB — CREATININE, URINE, RANDOM: Creatinine, Urine: 11.59 mg/dL

## 2022-02-02 LAB — SODIUM, URINE, RANDOM: Sodium, Ur: 65 mmol/L

## 2022-02-02 LAB — OSMOLALITY: Osmolality: 250 mOsm/kg — ABNORMAL LOW (ref 275–295)

## 2022-02-02 MED ORDER — ORAL CARE MOUTH RINSE: 15.0000 mL | OROMUCOSAL | Status: DC | PRN

## 2022-02-02 MED ORDER — OXYCODONE HCL 5 MG PO TABS
5.0000 mg | ORAL_TABLET | Freq: Every evening | ORAL | Status: DC | PRN
  Administered 2022-02-02: 5 mg via ORAL
  Filled 2022-02-02: qty 1

## 2022-02-02 MED ORDER — FLUOXETINE HCL 20 MG PO CAPS: 20.0000 mg | ORAL_CAPSULE | Freq: Every day | ORAL | Status: DC

## 2022-02-02 MED ORDER — ACETAMINOPHEN 325 MG PO TABS
650.0000 mg | ORAL_TABLET | Freq: Four times a day (QID) | ORAL | Status: DC | PRN
  Administered 2022-02-02: 650 mg via ORAL
  Filled 2022-02-02: qty 2

## 2022-02-02 MED ORDER — SODIUM CHLORIDE 0.9 % IV SOLN: INTRAVENOUS | Status: DC

## 2022-02-02 MED ORDER — GABAPENTIN 300 MG PO CAPS
300.0000 mg | ORAL_CAPSULE | Freq: Two times a day (BID) | ORAL | Status: DC
  Administered 2022-02-02 – 2022-02-04 (×5): 300 mg via ORAL
  Filled 2022-02-02 (×5): qty 1

## 2022-02-02 MED ORDER — ONDANSETRON HCL 4 MG/2ML IJ SOLN
4.0000 mg | Freq: Four times a day (QID) | INTRAMUSCULAR | Status: DC | PRN
  Administered 2022-02-02 – 2022-02-03 (×3): 4 mg via INTRAVENOUS
  Filled 2022-02-02 (×3): qty 2

## 2022-02-02 MED ORDER — ENOXAPARIN SODIUM 40 MG/0.4ML IJ SOSY
40.0000 mg | PREFILLED_SYRINGE | INTRAMUSCULAR | Status: DC
Start: 2022-02-02 — End: 2022-02-04
  Administered 2022-02-02 – 2022-02-04 (×3): 40 mg via SUBCUTANEOUS
  Filled 2022-02-02 (×3): qty 0.4

## 2022-02-02 MED ORDER — ACETAMINOPHEN 650 MG RE SUPP: 650.0000 mg | Freq: Four times a day (QID) | RECTAL | Status: DC | PRN

## 2022-02-02 MED ORDER — ONDANSETRON HCL 4 MG PO TABS: 4.0000 mg | ORAL_TABLET | Freq: Four times a day (QID) | ORAL | Status: DC | PRN

## 2022-02-02 MED ORDER — ANASTROZOLE 1 MG PO TABS
1.0000 mg | ORAL_TABLET | Freq: Every day | ORAL | Status: DC
  Administered 2022-02-02 – 2022-02-04 (×3): 1 mg via ORAL
  Filled 2022-02-02 (×4): qty 1

## 2022-02-02 NOTE — ED Provider Notes (Signed)
[facility] EMERGENCY DEPARTMENT Provider Note   CSN: [id number] Arrival date & time: 02/01/22  2326     History  Chief Complaint  Patient presents with   Emesis    Samantha Mcfarland is a 66 y.o. adult.  HPI     This is a 66 year old female with history of breast cancer status post mastectomy and chemotherapy, hypertension, depression who presents with nausea, vomiting, diarrhea.  Patient reports 2-day history of nonbloody diarrhea.  She developed nausea and vomiting this evening.  She took some Zofran with minimal relief.  She states she has felt somewhat lightheaded which is caused her to fall.  Denies any pain.  Denies any chest pain or shortness of breath.  Has had some epigastric discomfort with vomiting.  Of note, patient was recently admitted to the hospital on 8/5 and discharged on 8/7 for significant hyponatremia.  She self corrected with fluid resuscitation.  Home Medications Prior to Admission medications   Medication Sig Start Date End Date Taking? Authorizing Provider  acetaminophen (TYLENOL) 325 MG tablet Take 325 mg by mouth every 6 (six) hours as needed for moderate pain or headache.    [provider]  anastrozole (ARIMIDEX) 1 MG tablet Take 1 tablet (1 mg total) by mouth daily. 08/27/21   Derek Jack, MD  atorvastatin (LIPITOR) 20 MG tablet Take 1 tablet (20 mg total) by mouth daily. (NEEDS TO BE SEEN BEFORE NEXT REFILL) 11/11/21   Loman Brooklyn, FNP  cholecalciferol (VITAMIN D3) 25 MCG (1000 UNIT) tablet Take 1,000 Units by mouth daily.    [provider]  FLUoxetine (PROZAC) 20 MG capsule Take 20 mg by mouth daily. 11/10/21   [provider]  gabapentin (NEURONTIN) 300 MG capsule Take 1 capsule (300 mg total) by mouth 2 (two) times daily. 08/27/21   Derek Jack, MD  lisinopril (ZESTRIL) 10 MG tablet Take 10 mg by mouth daily. 10/20/21   [provider]  omeprazole (PRILOSEC) 20 MG capsule Take 1 capsule (20 mg total) by  mouth daily as needed (indigestion/heartburn). 05/01/21   Loman Brooklyn, FNP  ondansetron (ZOFRAN) 4 MG tablet Take 1 tablet (4 mg total) by mouth every 8 (eight) hours as needed for nausea or vomiting. 01/05/22   Evelina Dun A, FNP  oxyCODONE (OXY IR/ROXICODONE) 5 MG immediate release tablet Take 1 tablet (5 mg total) by mouth at bedtime as needed for severe pain. 12/03/21   Derek Jack, MD  vitamin B-12 (CYANOCOBALAMIN) 500 MCG tablet Take 500 mcg by mouth daily.    [provider]      Allergies    Ibuprofen and Penicillins    Review of Systems   Review of Systems  Constitutional:  Negative for fever.  Gastrointestinal:  Positive for diarrhea, nausea and vomiting.  Neurological:  Positive for light-headedness.  All other systems reviewed and are negative.   Physical Exam Updated Vital Signs BP 128/79   Pulse 65   Temp (!) 97.4 F (36.3 C) (Oral)   Resp 16   Ht 1.676 m ('5\' 6"'$ )   Wt 50.9 kg   SpO2 100%   BMI 18.11 kg/m  Physical Exam Vitals and nursing note reviewed.  Constitutional:      Appearance: She is well-developed.     Comments: Chronically ill-appearing but nontoxic  HENT:     Head: Normocephalic and atraumatic.     Mouth/Throat:     Mouth: Mucous membranes are dry.  Eyes:     Pupils: Pupils are equal,  round, and reactive to light.  Cardiovascular:     Rate and Rhythm: Normal rate and regular rhythm.     Heart sounds: Normal heart sounds. No murmur heard. Pulmonary:     Effort: Pulmonary effort is normal. No respiratory distress.     Breath sounds: Normal breath sounds. No wheezing.  Abdominal:     General: Bowel sounds are normal.     Palpations: Abdomen is soft.     Tenderness: There is abdominal tenderness. There is no guarding or rebound.  Musculoskeletal:     Cervical back: Neck supple.  Lymphadenopathy:     Cervical: No cervical adenopathy.  Skin:    General: Skin is warm and dry.  Neurological:     Mental Status: She is  alert and oriented to person, place, and time.  Psychiatric:        Mood and Affect: Mood normal.     ED Results / Procedures / Treatments   Labs (all labs ordered are listed, but only abnormal results are displayed) Labs Reviewed  CBC WITH DIFFERENTIAL/PLATELET - Abnormal; Notable for the following components:      Result Value   RBC 3.33 (*)    Hemoglobin 11.1 (*)    HCT 30.8 (*)    All other components within normal limits  COMPREHENSIVE METABOLIC PANEL - Abnormal; Notable for the following components:   Sodium 116 (*)    Chloride 84 (*)    BUN 6 (*)    Calcium 8.4 (*)    All other components within normal limits  LIPASE, BLOOD  URINALYSIS, ROUTINE W REFLEX MICROSCOPIC    EKG EKG Interpretation  Date/Time:  Monday February 01 2022 23:55:31 EDT Ventricular Rate:  62 PR Interval:  211 QRS Duration: 116 QT Interval:  460 QTC Calculation: 468 R Axis:   61 Text Interpretation: Sinus rhythm Nonspecific intraventricular conduction delay Confirmed by Thayer Jew [phone]) on 02/02/2022 1:03:49 AM  Radiology No results found.  Procedures .Critical Care  Performed by: Merryl Hacker, MD Authorized by: Merryl Hacker, MD   Critical care provider statement:    Critical care time (minutes):  31   Critical care was necessary to treat or prevent imminent or life-threatening deterioration of the following conditions:  Metabolic crisis   Critical care was time spent personally by me on the following activities:  Development of treatment plan with patient or surrogate, discussions with consultants, evaluation of patient's response to treatment, examination of patient, ordering and review of laboratory studies, ordering and review of radiographic studies, ordering and performing treatments and interventions, pulse oximetry, re-evaluation of patient's condition and review of old charts     Medications Ordered in ED Medications  0.9 %  sodium chloride infusion (has no  administration in time range)  sodium chloride 0.9 % bolus 1,000 mL (1,000 mLs Intravenous New Bag/Given 02/02/22 0012)    ED Course/ Medical Decision Making/ A&P                           Medical Decision Making Amount and/or Complexity of Data Reviewed Labs: ordered.  Risk Prescription drug management. Decision regarding hospitalization.   This patient presents to the ED for concern of vomiting and diarrhea, this involves an extensive number of treatment options, and is a complaint that carries with it a high risk of complications and morbidity.  I considered the following differential and admission for this acute, potentially life threatening condition.  The differential diagnosis  includes significant metabolic derangements, viral etiology, C. difficile  MDM:    This is a 66 year old female who presents with recurrent nausea, vomiting, and diarrhea.  Similar history of the same.  She was found to be significantly hyponatremic during an admission approximately 1 week ago.  Chart reviewed.  Sodium was down to 113.  She corrected with fluids.  No other work-up was undertaken.  Today she is nontoxic-appearing and vital signs are largely reassuring.  Exam is fairly benign except for she appears clinically dry.  Patient was given a liter of fluids.  Labs were obtained.  Repeat sodium today is again 116.  1 week ago at discharge sodium had corrected to 130.  I have started her on 75 cc/h of normal saline with a goal of slow correction.  Previously was presumed that her hyponatremia was related to the vomiting and diarrhea; however, given her history of breast cancer, she would be at increased risk for entities such as SIADH.  Additional lab work and urine testing was sent.  We will obtain a CT of the head to evaluate for obvious metastasis.  She is not having any other neurologic symptoms.  She will need admission to the hospital.  She has no evidence of anemia or other significant  derangement.  (Labs, imaging, consults)  Labs: I Ordered, and personally interpreted labs.  The pertinent results include: Sodium of 116  Imaging Studies ordered: I ordered imaging studies including CT head pending I independently visualized and interpreted imaging. I agree with the radiologist interpretation  Additional history obtained from family at bedside and chart review.  External records from outside source obtained and reviewed including hospitalization  Cardiac Monitoring: The patient was maintained on a cardiac monitor.  I personally viewed and interpreted the cardiac monitored which showed an underlying rhythm of: Sinus rhythm  Reevaluation: After the interventions noted above, I reevaluated the patient and found that they have :improved  Social Determinants of Health: Lives independently, history of breast cancer  Disposition: Admit  Co morbidities that complicate the patient evaluation  Past Medical History:  Diagnosis Date   Allergy    Anxiety    Breast cancer ([place])    Depression    Family history of breast cancer    Family history of colon cancer    Family history of pancreatic cancer    Family history of thyroid cancer    GERD (gastroesophageal reflux disease)    Hyperlipidemia    Hypertension    Osteopenia 04/30/2021   Port-A-Cath in place 05/07/2021   Vitamin D insufficiency      Medicines Meds ordered this encounter  Medications   sodium chloride 0.9 % bolus 1,000 mL   0.9 %  sodium chloride infusion    I have reviewed the patients home medicines and have made adjustments as needed  Problem List / ED Course: Problem List Items Addressed This Visit       Other   Hyponatremia - Primary   Other Visit Diagnoses     Vomiting and diarrhea                       Final Clinical Impression(s) / ED Diagnoses Final diagnoses:  Hyponatremia  Vomiting and diarrhea    Rx / DC Orders ED Discharge Orders     None          Merryl Hacker, MD 02/02/22 [phone]

## 2022-02-02 NOTE — Progress Notes (Signed)
  Transition of Care [facility]) Screening Note   Patient Details  Name: Melanny Wire Date of Birth: 05-Feb-1956   Transition [facility]) CM/SW Contact:    Iona Beard, [place] Phone Number: 02/02/2022, 10:03 AM    Transition of Care Department [facility]) has reviewed patient and no TOC needs have been identified at this time. We will continue to monitor patient advancement through interdisciplinary progression rounds. If new patient transition needs arise, please place a TOC consult.

## 2022-02-02 NOTE — Assessment & Plan Note (Addendum)
Acute on chronic hyponatremia.  Has recurrent N/V/D. 1 week ago was treated with saline IVF. Not sure if that 130 at time of discharge was real, or just a lab error, looks like sodiums had been hovering in the 110-120 range all admit before suddenly rising to 130 on day of discharge last week. 1. Urine lytes done last admit on 8/5 look very worrisome for SIADH to me: urine sodium of 109, with a serum sodium of 114. 2. BMP Q6H to monitor sodium. 3. Getting urine lytes repeat 4. Seabron Spates put in for nephrology consult, those urine lytes 10 days ago look very suspicious for a patient who is inappropriately urinating out her sodium (ie SIADH) to me. 1. Got 1L NS in ED 2. Going to hold off on further NS IVF for the moment 3. Depending on nephro recs: may need to start fluid restriction but given N/V/D ill hold off on fluid restriction for the moment.  At least for next couple of hours.

## 2022-02-02 NOTE — ED Notes (Signed)
Lab in room.

## 2022-02-02 NOTE — ED Notes (Signed)
Critical value Sodium 116. Reported to edp [place]. See orders

## 2022-02-02 NOTE — Progress Notes (Signed)
Date and time results received: 02/02/22 at 0719  Test: Na Critical Value: 118  Name of Provider Notified: Heath Lark, DO paged  Orders Received? Or Actions Taken?: No new orders at this time

## 2022-02-02 NOTE — Assessment & Plan Note (Addendum)
Recurrent N/V/D: onset of diarrhea x2 days ago, and N/V onset earlier this evening. Unclear cause but likely deserves further work up given the recurrence (second bout of this so far this month). 1. GI pathogen pnl 2. Consider CT [facility] during admit given recurrent symptoms.

## 2022-02-02 NOTE — Plan of Care (Signed)
  Problem: Pain Managment: Goal: General experience of comfort will improve Outcome: Progressing   Problem: Safety: Goal: Ability to remain free from injury will improve Outcome: Progressing   Problem: Skin Integrity: Goal: Risk for impaired skin integrity will decrease Outcome: Progressing   

## 2022-02-02 NOTE — Consult Note (Signed)
Reason for Consult:hyponatremia Referring Physician: [name] is an 66 y.o. female with past medical history significant for HTN, hyperlipidemia and history of breast cancer in remission on arimidex.  In reviewing labs from 11/22 to 2/23 sodium seemed to run in the low 130's high 120's-  128 on 3/9 23 and then 122 of 11/26/21.  There are labs from July showing sodium of 113 on 7/18 then improved to 123 on 7/21-  unclear what intervention if any.  She then had a hospitalization from 8/5-8/7 where again she had hyponatremia-  sodium improved from 113 to 130 in 2 days with NS IV-  thought to be hypovolemic in the setting of N/V/D.  Presented again last night with N/V/D with sodium of 116-  is 118 this AM-  urine osm not done-  urine sodium 65.  She tells me that she resumed the prozac a couple of weeks ago.  When I question her about diet it appears that she may have a tea and toast diet-  does not habitually drink a lot of fluids-  no volume excess  Trend in Creatinine: Creatinine, Ser  Date/Time Value Ref Range Status  02/02/2022 05:50 AM 0.45 0.44 - 1.00 mg/dL Final  02/02/2022 12:14 AM 0.44 0.44 - 1.00 mg/dL Final  01/25/2022 04:53 AM 0.63 0.44 - 1.00 mg/dL Final  01/24/2022 09:18 AM 0.40 (L) 0.44 - 1.00 mg/dL Final  01/24/2022 12:40 AM 0.42 (L) 0.44 - 1.00 mg/dL Final  01/23/2022 04:35 PM 0.45 0.44 - 1.00 mg/dL Final  01/23/2022 08:59 AM 0.50 0.44 - 1.00 mg/dL Final  01/08/2022 08:31 AM 0.97 0.57 - 1.00 mg/dL Final  01/05/2022 12:31 PM 0.60 0.57 - 1.00 mg/dL Final  11/26/2021 01:10 PM 0.51 0.44 - 1.00 mg/dL Final  08/27/2021 10:15 AM 0.63 0.44 - 1.00 mg/dL Final  08/06/2021 08:58 AM 0.54 0.44 - 1.00 mg/dL Final  07/30/2021 08:25 AM 0.66 0.44 - 1.00 mg/dL Final  07/09/2021 08:36 AM 0.64 0.44 - 1.00 mg/dL Final  07/02/2021 12:38 PM 0.62 0.44 - 1.00 mg/dL Final  06/18/2021 09:42 AM 0.62 0.44 - 1.00 mg/dL Final  06/04/2021 09:43 AM 0.71 0.44 - 1.00 mg/dL Final  05/28/2021 08:23 AM 0.57  0.44 - 1.00 mg/dL Final  05/22/2021 11:00 AM 0.71 0.44 - 1.00 mg/dL Final  04/22/2021 02:09 PM 0.56 0.44 - 1.00 mg/dL Final   Sodium  Date/Time Value Ref Range Status  02/02/2022 05:50 AM 118 (LL) 135 - 145 mmol/L Final    Comment:    CRITICAL RESULT CALLED TO, READ BACK BY AND VERIFIED WITH: CHRISTY DAVENPORT @ 0720 ON 02/02/22 C VARNER   02/02/2022 12:14 AM 116 (LL) 135 - 145 mmol/L Final    Comment:    CRITICAL RESULT CALLED TO, READ BACK BY AND VERIFIED WITH: RUSH,C'@0059'$  BY MATTHEWS,B 8.15.2023   01/25/2022 04:53 AM 130 (L) 135 - 145 mmol/L Final    Comment:    DELTA CHECK NOTED  01/24/2022 09:18 AM 116 (LL) 135 - 145 mmol/L Final    Comment:    CRITICAL RESULT CALLED TO, READ BACK BY AND VERIFIED WITH: L.COLEMAN @ 8315 BY STEPHTR 01/24/22   01/24/2022 12:40 AM 118 (LL) 135 - 145 mmol/L Final    Comment:    CRITICAL RESULT CALLED TO, READ BACK BY AND VERIFIED WITH: KILMER,T @ 0300 ON 01/24/22 BY JUW   01/23/2022 04:35 PM 113 (LL) 135 - 145 mmol/L Final    Comment:    CRITICAL RESULT CALLED TO, READ BACK BY  AND VERIFIED WITH: BRITTANY ROBERTSON '@1730'$  01/23/22 BY GMCGEEHON.   01/23/2022 08:59 AM 114 (LL) 135 - 145 mmol/L Final    Comment:    CRITICAL RESULT CALLED TO, READ BACK BY AND VERIFIED WITH: COE @ 0929 ON [id number] BY HENDERSON L   01/08/2022 08:31 AM 123 (L) 134 - 144 mmol/L Final  01/05/2022 12:31 PM 113 (LL) 134 - 144 mmol/L Final    Comment:    **Verified by repeat analysis**  11/26/2021 01:10 PM 122 (L) 135 - 145 mmol/L Final  08/27/2021 10:15 AM 128 (L) 135 - 145 mmol/L Final  08/06/2021 08:58 AM 130 (L) 135 - 145 mmol/L Final  07/30/2021 08:25 AM 128 (L) 135 - 145 mmol/L Final  07/09/2021 08:36 AM 132 (L) 135 - 145 mmol/L Final  07/02/2021 12:38 PM 131 (L) 135 - 145 mmol/L Final  06/18/2021 09:42 AM 133 (L) 135 - 145 mmol/L Final  06/04/2021 09:43 AM 127 (L) 135 - 145 mmol/L Final  05/28/2021 08:23 AM 131 (L) 135 - 145 mmol/L Final  05/22/2021 11:00 AM 130  (L) 135 - 145 mmol/L Final  04/22/2021 02:09 PM 130 (L) 135 - 145 mmol/L Final   PMH:   Past Medical History:  Diagnosis Date   Allergy    Anxiety    Breast cancer ([place])    Depression    Family history of breast cancer    Family history of colon cancer    Family history of pancreatic cancer    Family history of thyroid cancer    GERD (gastroesophageal reflux disease)    Hyperlipidemia    Hypertension    Osteopenia 04/30/2021   Port-A-Cath in place 05/07/2021   Vitamin D insufficiency     PSH:   Past Surgical History:  Procedure Laterality Date   EXCISION OF SKIN TAG Right 05/27/2021   Procedure: EXCISION OF RIGHT SKIN TAG;  Surgeon: Virl Cagey, MD;  Location: [facility];  Service: General;  Laterality: Right;   MASTECTOMY W/ SENTINEL NODE BIOPSY Left 03/23/2021   Procedure: TOTAL MASTECTOMY WITH SENTINEL LYMPH NODE BIOPSY;  Surgeon: Virl Cagey, MD;  Location: [facility];  Service: General;  Laterality: Left;   NECK SURGERY     OOPHORECTOMY     PORT-A-CATH REMOVAL Right 09/23/2021   Procedure: MINOR REMOVAL PORT-A-CATH;  Surgeon: Virl Cagey, MD;  Location: [facility];  Service: General;  Laterality: Right;   PORTACATH PLACEMENT Right 05/27/2021   Procedure: INSERTION PORT-A-CATH;  Surgeon: Virl Cagey, MD;  Location: [facility];  Service: General;  Laterality: Right;   SALPINGECTOMY      Allergies:  Allergies  Allergen Reactions   Ibuprofen Other (See Comments)    "Stomach upset"   Penicillins Rash    Medications:   Prior to Admission medications   Medication Sig Start Date End Date Taking? Authorizing Provider  acetaminophen (TYLENOL) 325 MG tablet Take 325 mg by mouth every 6 (six) hours as needed for moderate pain or headache.   Yes [provider]  anastrozole (ARIMIDEX) 1 MG tablet Take 1 tablet (1 mg total) by mouth daily. 08/27/21  Yes Derek Jack, MD  atorvastatin (LIPITOR) 20 MG tablet Take 1 tablet (20 mg total) by mouth daily.  (NEEDS TO BE SEEN BEFORE NEXT REFILL) 11/11/21  Yes Hendricks Limes F, FNP  cholecalciferol (VITAMIN D3) 25 MCG (1000 UNIT) tablet Take 1,000 Units by mouth daily.   Yes [provider]  FLUoxetine (PROZAC) 20 MG capsule Take 20 mg by  mouth daily. 11/10/21  Yes [provider]  gabapentin (NEURONTIN) 300 MG capsule Take 1 capsule (300 mg total) by mouth 2 (two) times daily. 08/27/21  Yes Derek Jack, MD  lisinopril (ZESTRIL) 10 MG tablet Take 10 mg by mouth daily. 10/20/21  Yes [provider]  vitamin B-12 (CYANOCOBALAMIN) 500 MCG tablet Take 500 mcg by mouth daily.   Yes [provider]  omeprazole (PRILOSEC) 20 MG capsule Take 1 capsule (20 mg total) by mouth daily as needed (indigestion/heartburn). Patient not taking: Reported on 02/02/2022 05/01/21   Loman Brooklyn, FNP  ondansetron (ZOFRAN) 4 MG tablet Take 1 tablet (4 mg total) by mouth every 8 (eight) hours as needed for nausea or vomiting. Patient not taking: Reported on 02/02/2022 01/05/22   Evelina Dun A, FNP  oxyCODONE (OXY IR/ROXICODONE) 5 MG immediate release tablet Take 1 tablet (5 mg total) by mouth at bedtime as needed for severe pain. Patient not taking: Reported on 02/02/2022 12/03/21   Derek Jack, MD    Discontinued Meds:   Medications Discontinued During This Encounter  Medication Reason   0.9 %  sodium chloride infusion     Social History:  reports that she has quit smoking. Her smoking use included cigarettes. She smoked an average of .25 packs per day. She has never used smokeless tobacco. She reports current alcohol use of about 20.0 standard drinks of alcohol per week. She reports that she does not use drugs.  Family History:   Family History  Problem Relation Age of Onset   Stroke Mother    Alcohol abuse Mother    Diabetes Father    Pancreatic cancer Father 25   Testicular cancer Father        dx 31s   Pancreatic disease Father    Breast cancer Maternal Aunt         dx 50s-60s   Breast cancer Paternal Aunt 53   Thyroid cancer Paternal Uncle    Breast cancer Maternal Grandmother        dx 22s   Colon cancer Paternal Grandfather        dx 43s    Ros-  describes "decreased energy'  which she had last week but it resolved when her sodium normalized-  N/V/D but better since hospitalized-  positive for back pain   Blood pressure 115/68, pulse 64, temperature 98 F (36.7 C), temperature source Oral, resp. rate 16, height '5\' 6"'$  (1.676 m), weight 50.9 kg, SpO2 100 %. General appearance: alert, cooperative, and mild distress Resp: clear to auscultation bilaterally Cardio: regular rate and rhythm, S1, S2 normal, no murmur, click, rub or gallop GI: soft, non-tender; bowel sounds normal; no masses,  no organomegaly Extremities: extremities normal, atraumatic, no cyanosis or edema Neurologic: Grossly normal  but globally weak  Labs: Basic Metabolic Panel: Recent Labs  Lab 02/02/22 0014 02/02/22 0550  NA 116* 118*  K 4.7 4.5  CL 84* 89*  CO2 23 23  GLUCOSE 92 86  BUN 6* 6*  CREATININE 0.44 0.45  ALBUMIN 4.1  --   CALCIUM 8.4* 8.4*   Liver Function Tests: Recent Labs  Lab 02/02/22 0014  AST 32  ALT 30  ALKPHOS 38  BILITOT 0.3  PROT 6.7  ALBUMIN 4.1   Recent Labs  Lab 02/02/22 0014  LIPASE 41   No results for input(s): "AMMONIA" in the last 168 hours. CBC: Recent Labs  Lab 02/02/22 0014 02/02/22 0550  WBC 4.7 7.2  NEUTROABS 3.0  --  HGB 11.1* 10.9*  HCT 30.8* 29.9*  MCV 92.5 92.9  PLT 379 368   PT/INR: '@labrcntip'$ (inr:5) Cardiac Enzymes: No results for input(s): "CKTOTAL", "CKMB", "CKMBINDEX", "TROPONINI" in the last 168 hours. CBG: No results for input(s): "GLUCAP" in the last 168 hours.  Iron Studies: No results for input(s): "IRON", "TIBC", "TRANSFERRIN", "FERRITIN" in the last 168 hours.  Xrays/Other Studies: CT Head Wo Contrast  Result Date: 02/02/2022 CLINICAL DATA:  Dizziness and recent falls, initial  encounter EXAM: CT HEAD WITHOUT CONTRAST TECHNIQUE: Contiguous axial images were obtained from the base of the skull through the vertex without intravenous contrast. RADIATION DOSE REDUCTION: This exam was performed according to the departmental dose-optimization program which includes automated exposure control, adjustment of the mA and/or kV according to patient size and/or use of iterative reconstruction technique. COMPARISON:  None Available. FINDINGS: Brain: No evidence of acute infarction, hemorrhage, hydrocephalus, extra-axial collection or mass lesion/mass effect. Vascular: No hyperdense vessel or unexpected calcification. Skull: Normal. Negative for fracture or focal lesion. Sinuses/Orbits: No acute finding. Other: None. IMPRESSION: No acute intracranial abnormality no Electronically Signed   By: Inez Catalina M.D.   On: 02/02/2022 02:13     Assessment/Plan: 66 year old WF with recurrent hyponatremia  1. Hyponatremia-  pt with baseline sodium at best in the low 130's. This is the second hospitalization for this patient in 2 weeks for hyponatremia.   The one thing I notice is that she is on fluoxetine that can cause SIADH-  said that she restarted taking in in the last couple of weeks-  agree with holding that med.  TSH is normal-  will check cortisol in AM and also urine osm.  She is not overloaded and with a urine sodium of 65 dont think she is significantly dry.  She could have an element of tea and toast diet but does not compulsively drink fluids by history.  The plan right now should be to continue to hold fluoxetine-  fluid restrict and I anticipate that sodium will drift up slowly-  will do urine osm to prove SIADH and check AM cortisol for completeness sake.  I think that the sxms she is having are caused by her sodium and not the other way around.  Agree with checking labs q 6-  if sodium not improving can use a little NS   Rowland Ericsson A Grady Lucci 02/02/2022, 9:29 AM

## 2022-02-02 NOTE — Assessment & Plan Note (Addendum)
Hold home lisinopril for the moment.

## 2022-02-02 NOTE — Progress Notes (Signed)
Samantha Mcfarland is a 66 y.o. adult with medical history significant of Breast CA in remission, HTN, HLD. Pt with recent admission from 8/5-8/7 with hyponatremia. During that admit it was felt hyponatremia likely secondary to N/V/D prior to admit, possibly from GI illness.  She was treated with normal saline IV and sent home, but she has recurrent nausea, vomiting, diarrhea and stool pathogen panel has been ordered and is pending.  TSH is within normal limits and nephrology consulted for assistance in management.  She appears euvolemic and therefore IV fluid deferred for now, but it appears that some of this may be related to her fluoxetine use that could be contributing to SIADH versus tea and toast diet.  Fluid restriction ongoing for right now as well as recheck of sodium levels.  Plan to check urine osmolarity and a.m. cortisol.  Appreciate ongoing nephrology evaluation.  Total care time: 20 minutes.

## 2022-02-02 NOTE — ED Notes (Signed)
Pt to ct 

## 2022-02-02 NOTE — Progress Notes (Signed)
Date and time results received: 02/02/22 at 1255  Test: Na Critical Value: 116  Name of Provider Notified: Heath Lark, DO paged  Orders Received? Or Actions Taken?:  No new orders at this time

## 2022-02-02 NOTE — Progress Notes (Signed)
Date and time results received: 02/02/22 at [place]  Test: Na Critical Value: 117  Name of Provider Notified: Heath Lark, DO paged  Orders Received? Or Actions Taken?:  No new orders at this time

## 2022-02-02 NOTE — H&P (Signed)
History and Physical    Patient: Samantha Mcfarland DDU:[id number] DOB: 24-Nov-1955 DOA: 02/01/2022 DOS: the patient was seen and examined on 02/02/2022 PCP: Loman Brooklyn, FNP  Patient coming from: Home  Chief Complaint:  Chief Complaint  Patient presents with   Emesis   HPI: Samantha Mcfarland is a 66 y.o. adult with medical history significant of Breast CA in remission, HTN, HLD.  Pt with recent admission from 8/5-8/7 with hyponatremia.  During that admit it was felt hyponatremia likely secondary to N/V/D prior to admit, possibly from GI illness.  Pt treated with NS IV  Serum sodium went 114 (admission) -> 113 -> 118 -> 116 -> 130.  Pt discharged.  2 days ago pt had onset of diarrhea again.  Yesterday onset of N/V just for last night.  In to ED where serum sodium is 116 today.   Review of Systems: As mentioned in the history of present illness. All other systems reviewed and are negative. Past Medical History:  Diagnosis Date   Allergy    Anxiety    Breast cancer ([place])    Depression    Family history of breast cancer    Family history of colon cancer    Family history of pancreatic cancer    Family history of thyroid cancer    GERD (gastroesophageal reflux disease)    Hyperlipidemia    Hypertension    Osteopenia 04/30/2021   Port-A-Cath in place 05/07/2021   Vitamin D insufficiency    Past Surgical History:  Procedure Laterality Date   EXCISION OF SKIN TAG Right 05/27/2021   Procedure: EXCISION OF RIGHT SKIN TAG;  Surgeon: Virl Cagey, MD;  Location: [facility];  Service: General;  Laterality: Right;   MASTECTOMY W/ SENTINEL NODE BIOPSY Left 03/23/2021   Procedure: TOTAL MASTECTOMY WITH SENTINEL LYMPH NODE BIOPSY;  Surgeon: Virl Cagey, MD;  Location: [facility];  Service: General;  Laterality: Left;   NECK SURGERY     OOPHORECTOMY     PORT-A-CATH REMOVAL Right 09/23/2021   Procedure: MINOR REMOVAL PORT-A-CATH;  Surgeon: Virl Cagey, MD;  Location: [facility];   Service: General;  Laterality: Right;   PORTACATH PLACEMENT Right 05/27/2021   Procedure: INSERTION PORT-A-CATH;  Surgeon: Virl Cagey, MD;  Location: [facility];  Service: General;  Laterality: Right;   SALPINGECTOMY     Social History:  reports that she has quit smoking. Her smoking use included cigarettes. She smoked an average of .25 packs per day. She has never used smokeless tobacco. She reports current alcohol use of about 20.0 standard drinks of alcohol per week. She reports that she does not use drugs.  Allergies  Allergen Reactions   Ibuprofen Other (See Comments)    "Stomach upset"   Penicillins Rash    Family History  Problem Relation Age of Onset   Stroke Mother    Alcohol abuse Mother    Diabetes Father    Pancreatic cancer Father 101   Testicular cancer Father        dx 4s   Pancreatic disease Father    Breast cancer Maternal Aunt        dx 50s-60s   Breast cancer Paternal Aunt 75   Thyroid cancer Paternal Uncle    Breast cancer Maternal Grandmother        dx 66s   Colon cancer Paternal Grandfather        dx 45s    Prior to Admission medications   Medication Sig Start Date  End Date Taking? Authorizing Provider  acetaminophen (TYLENOL) 325 MG tablet Take 325 mg by mouth every 6 (six) hours as needed for moderate pain or headache.    [provider]  anastrozole (ARIMIDEX) 1 MG tablet Take 1 tablet (1 mg total) by mouth daily. 08/27/21   Derek Jack, MD  atorvastatin (LIPITOR) 20 MG tablet Take 1 tablet (20 mg total) by mouth daily. (NEEDS TO BE SEEN BEFORE NEXT REFILL) 11/11/21   Loman Brooklyn, FNP  cholecalciferol (VITAMIN D3) 25 MCG (1000 UNIT) tablet Take 1,000 Units by mouth daily.    [provider]  FLUoxetine (PROZAC) 20 MG capsule Take 20 mg by mouth daily. 11/10/21   [provider]  gabapentin (NEURONTIN) 300 MG capsule Take 1 capsule (300 mg total) by mouth 2 (two) times daily. 08/27/21   Derek Jack, MD   lisinopril (ZESTRIL) 10 MG tablet Take 10 mg by mouth daily. 10/20/21   [provider]  omeprazole (PRILOSEC) 20 MG capsule Take 1 capsule (20 mg total) by mouth daily as needed (indigestion/heartburn). 05/01/21   Loman Brooklyn, FNP  ondansetron (ZOFRAN) 4 MG tablet Take 1 tablet (4 mg total) by mouth every 8 (eight) hours as needed for nausea or vomiting. 01/05/22   Evelina Dun A, FNP  oxyCODONE (OXY IR/ROXICODONE) 5 MG immediate release tablet Take 1 tablet (5 mg total) by mouth at bedtime as needed for severe pain. 12/03/21   Derek Jack, MD  vitamin B-12 (CYANOCOBALAMIN) 500 MCG tablet Take 500 mcg by mouth daily.    [provider]    Physical Exam: Vitals:   02/02/22 0030 02/02/22 0100 02/02/22 0130 02/02/22 0200  BP: 118/71 118/70 110/69 101/66  Pulse: 65 68 70 65  Resp: (!) '21 13 14 11  '$ Temp:      TempSrc:      SpO2: 99% 100% 100% 99%  Weight:      Height:       Constitutional: NAD, calm, comfortable Eyes: PERRL, lids and conjunctivae normal ENMT: Mucous membranes are moist. Posterior pharynx clear of any exudate or lesions.Normal dentition.  Neck: normal, supple, no masses, no thyromegaly Respiratory: clear to auscultation bilaterally, no wheezing, no crackles. Normal respiratory effort. No accessory muscle use.  Cardiovascular: Regular rate and rhythm, no murmurs / rubs / gallops. No extremity edema. 2+ pedal pulses. No carotid bruits.  Abdomen: no tenderness, no masses palpated. No hepatosplenomegaly. Bowel sounds positive.  Musculoskeletal: no clubbing / cyanosis. No joint deformity upper and lower extremities. Good ROM, no contractures. Normal muscle tone.  Skin: no rashes, lesions, ulcers. No induration Neurologic: CN 2-12 grossly intact. Sensation intact, DTR normal. Strength 5/5 in all 4.  Psychiatric: Normal judgment and insight. Alert and oriented x 3. Normal mood.   Data Reviewed:        Latest Ref Rng & Units 02/02/2022   12:14  AM 01/25/2022    4:53 AM 01/24/2022    9:18 AM  BMP  Glucose 70 - 99 mg/dL 92  96  115   BUN 8 - 23 mg/dL 6  5  <5   Creatinine 0.44 - 1.00 mg/dL 0.44  0.63  0.40   Sodium 135 - 145 mmol/L 116  130  116   Potassium 3.5 - 5.1 mmol/L 4.7  4.7  3.6   Chloride 98 - 111 mmol/L 84  106  91   CO2 22 - 32 mmol/L '23  22  21   '$ Calcium 8.9 - 10.3 mg/dL 8.4  8.4  8.1     Last admit on 8/5: Urine sodium: 109 Urine K: 44 Urine osm: 387 Serum Osm 236  Assessment and Plan: * Hyponatremia Acute on chronic hyponatremia.  Has recurrent N/V/D. 1 week ago was treated with saline IVF. Not sure if that 130 at time of discharge was real, or just a lab error, looks like sodiums had been hovering in the 110-120 range all admit before suddenly rising to 130 on day of discharge last week. Urine lytes done last admit on 8/5 look very worrisome for SIADH to me: urine sodium of 109, with a serum sodium of 114. BMP Q6H to monitor sodium. Getting urine lytes repeat Gonna put in for nephrology consult, those urine lytes 10 days ago look very suspicious for a patient who is inappropriately urinating out her sodium (ie SIADH) to me. Got 1L NS in ED Going to hold off on further NS IVF for the moment Depending on nephro recs: may need to start fluid restriction but given N/V/D ill hold off on fluid restriction for the moment.  At least for next couple of hours.  Nausea vomiting and diarrhea Recurrent N/V/D: onset of diarrhea x2 days ago, and N/V onset earlier this evening. Unclear cause but likely deserves further work up given the recurrence (second bout of this so far this month). GI pathogen pnl Consider CT [facility] during admit given recurrent symptoms.  Essential hypertension Hold home lisinopril for the moment.  Breast cancer, left ([facility]) Continue anastrazole EDP ordered CT head. Im thinking we may want to PAN CT the patient with apparent SIADH though. Ill hold off for now though and let nephrology confirm the  SIADH diagnosis first.      Advance Care Planning:   Code Status: Full Code  Consults: Nephrology consult put into epic  Family Communication: Family at bedside  Severity of Illness: The appropriate patient status for this patient is OBSERVATION. Observation status is judged to be reasonable and necessary in order to provide the required intensity of service to ensure the patient's safety. The patient's presenting symptoms, physical exam findings, and initial radiographic and laboratory data in the context of their medical condition is felt to place them at decreased risk for further clinical deterioration. Furthermore, it is anticipated that the patient will be medically stable for discharge from the hospital within 2 midnights of admission.   Author: Etta Quill., DO 02/02/2022 2:16 AM  For on call review [email or web address].

## 2022-02-02 NOTE — Assessment & Plan Note (Addendum)
Continue anastrazole EDP ordered CT head. 1. Im thinking we may want to PAN CT the patient with apparent SIADH though. 2. Ill hold off for now though and let nephrology confirm the SIADH diagnosis first.

## 2022-02-03 ENCOUNTER — Inpatient Hospital Stay: Payer: Medicare Other | Admitting: Family Medicine

## 2022-02-03 ENCOUNTER — Encounter (HOSPITAL_COMMUNITY): Payer: Self-pay | Admitting: Internal Medicine

## 2022-02-03 DIAGNOSIS — E871 Hypo-osmolality and hyponatremia: Secondary | ICD-10-CM | POA: Diagnosis not present

## 2022-02-03 LAB — BASIC METABOLIC PANEL
Anion gap: 6 (ref 5–15)
Anion gap: 7 (ref 5–15)
Anion gap: 7 (ref 5–15)
BUN: 5 mg/dL — ABNORMAL LOW (ref 8–23)
BUN: 5 mg/dL — ABNORMAL LOW (ref 8–23)
BUN: 8 mg/dL (ref 8–23)
CO2: 22 mmol/L (ref 22–32)
CO2: 24 mmol/L (ref 22–32)
CO2: 25 mmol/L (ref 22–32)
Calcium: 8.3 mg/dL — ABNORMAL LOW (ref 8.9–10.3)
Calcium: 8.9 mg/dL (ref 8.9–10.3)
Calcium: 8.9 mg/dL (ref 8.9–10.3)
Chloride: 89 mmol/L — ABNORMAL LOW (ref 98–111)
Chloride: 89 mmol/L — ABNORMAL LOW (ref 98–111)
Chloride: 85 mmol/L — ABNORMAL LOW (ref 98–111)
Creatinine, Ser: 0.46 mg/dL (ref 0.44–1.00)
Creatinine, Ser: 0.55 mg/dL (ref 0.44–1.00)
Creatinine, Ser: 0.61 mg/dL (ref 0.44–1.00)
GFR, Estimated: >60 mL/min (ref >60)
GFR, Estimated: >60 mL/min (ref >60)
GFR, Estimated: >60 mL/min (ref >60)
Glucose, Bld: 87 mg/dL (ref 70–99)
Glucose, Bld: 82 mg/dL (ref 70–99)
Glucose, Bld: 104 mg/dL — ABNORMAL HIGH (ref 70–99)
Potassium: 3.5 mmol/L (ref 3.5–5.1)
Potassium: 4.2 mmol/L (ref 3.5–5.1)
Potassium: 4.1 mmol/L (ref 3.5–5.1)
Sodium: 117 mmol/L — CL (ref 135–145)
Sodium: 120 mmol/L — ABNORMAL LOW (ref 135–145)
Sodium: 117 mmol/L — CL (ref 135–145)

## 2022-02-03 LAB — MAGNESIUM: Magnesium: 1.8 mg/dL (ref 1.7–2.4)

## 2022-02-03 MED ORDER — OXYCODONE HCL 5 MG PO TABS
5.0000 mg | ORAL_TABLET | Freq: Three times a day (TID) | ORAL | Status: DC | PRN
  Administered 2022-02-03 – 2022-02-04 (×3): 5 mg via ORAL
  Filled 2022-02-03 (×3): qty 1

## 2022-02-03 NOTE — Progress Notes (Signed)
Date and time results received: 02/03/22 1630 (use smartphrase ".now" to insert current time)  Test: sodium Critical Value: 117  Name of Provider Notified: Dr. Manuella Ghazi  Orders Received? Or Actions Taken?:

## 2022-02-03 NOTE — Progress Notes (Signed)
Subjective:  No major issues overnight-  sodium is climbing slowly-  up to 120 this AM.  Is wide awake this AM-  asking for oxycodone and gabapentin-  saying had restless legs-  did not sleep well.  BUT-  her GI sxms seems to be better-  urine osm is inappropriate for sodium in the teens  Objective Vital signs in last 24 hours: Vitals:   02/02/22 0512 02/02/22 1401 02/02/22 2044 02/03/22 0543  BP: 115/68 113/71 111/69 136/78  Pulse: 64 70 60 (!) 56  Resp: '16 16 20   '$ Temp: 98 F (36.7 C) 98.4 F (36.9 C) 98.2 F (36.8 C) 98 F (36.7 C)  TempSrc: Oral Oral Oral Oral  SpO2: 100% 100% 100% 100%  Weight:      Height:       Weight change:   Intake/Output Summary (Last 24 hours) at 02/03/2022 0751 Last data filed at 02/02/2022 1800 Gross per 24 hour  Intake 360 ml  Output 750 ml  Net -390 ml    Assessment/Plan: 66 year old WF with recurrent hyponatremia  1. Hyponatremia-  pt with baseline sodium at best in the low 130's. This is the second hospitalization for this patient in 2 weeks for hyponatremia.   The one thing I notice is that she is on fluoxetine that can cause SIADH-  said that she restarted taking in in the last couple of weeks-  agree with holding that med.  TSH is normal-  cortisol is OK-  urine osm inappropriately high as above.  She is not overloaded and with a urine sodium of 65 dont think she is significantly dry.  She could have an element of tea and toast diet but does not compulsively drink fluids by history.  The plan right now should be to continue to hold fluoxetine-  fluid restrict and I anticipate that sodium will drift up slowly-   I think that the GI sxms she is having are caused by her sodium and not the other way around-  they overall are bette this AM.  Agree with checking labs but will back off on frequency-  I would encourage her to be more mobile in preparation for discharge   [place]: Basic Metabolic Panel: Recent Labs  Lab  02/02/22 1744 02/03/22 0017 02/03/22 0539  NA 117* 117* 120*  K 3.4* 3.5 4.2  CL 88* 89* 89*  CO2 21* 22 24  GLUCOSE 72 87 82  BUN 5* 5* 5*  CREATININE 0.50 0.46 0.55  CALCIUM 8.5* 8.3* 8.9   Liver Function Tests: Recent Labs  Lab 02/02/22 0014  AST 32  ALT 30  ALKPHOS 38  BILITOT 0.3  PROT 6.7  ALBUMIN 4.1   Recent Labs  Lab 02/02/22 0014  LIPASE 41   No results for input(s): "AMMONIA" in the last 168 hours. CBC: Recent Labs  Lab 02/02/22 0014 02/02/22 0550  WBC 4.7 7.2  NEUTROABS 3.0  --   HGB 11.1* 10.9*  HCT 30.8* 29.9*  MCV 92.5 92.9  PLT 379 368   Cardiac Enzymes: No results for input(s): "CKTOTAL", "CKMB", "CKMBINDEX", "TROPONINI" in the last 168 hours. CBG: No results for input(s): "GLUCAP" in the last 168 hours.  Iron Studies: No results for input(s): "IRON", "TIBC", "TRANSFERRIN", "FERRITIN" in the last 72 hours. Studies/Results: CT Head Wo Contrast  Result Date: 02/02/2022 CLINICAL DATA:  Dizziness and recent falls, initial encounter EXAM: CT HEAD WITHOUT CONTRAST TECHNIQUE: Contiguous axial images were  obtained from the base of the skull through the vertex without intravenous contrast. RADIATION DOSE REDUCTION: This exam was performed according to the departmental dose-optimization program which includes automated exposure control, adjustment of the mA and/or kV according to patient size and/or use of iterative reconstruction technique. COMPARISON:  None Available. FINDINGS: Brain: No evidence of acute infarction, hemorrhage, hydrocephalus, extra-axial collection or mass lesion/mass effect. Vascular: No hyperdense vessel or unexpected calcification. Skull: Normal. Negative for fracture or focal lesion. Sinuses/Orbits: No acute finding. Other: None. IMPRESSION: No acute intracranial abnormality no Electronically Signed   By: Inez Catalina M.D.   On: 02/02/2022 02:13   Medications: Infusions:   Scheduled Medications:  anastrozole  1 mg Oral Daily    enoxaparin (LOVENOX) injection  40 mg Subcutaneous Q24H   gabapentin  300 mg Oral BID    have reviewed scheduled and prn medications.  Physical Exam: General:  thin nervous-   NAD Heart: RRR Lungs: mostly clear Abdomen: soft, non tender Extremities: no edema    02/03/2022,7:51 AM  LOS: 1 day

## 2022-02-03 NOTE — Evaluation (Signed)
Occupational Therapy Evaluation Patient Details Name: Samantha Mcfarland MRN: [id number] DOB: [birth year] Today's Date: 02/03/2022   History of Present Illness Samantha Mcfarland is a 66 y.o. adult with medical history significant of Breast CA in remission, HTN, HLD.     Pt with recent admission from 8/5-8/7 with hyponatremia.     During that admit it was felt hyponatremia likely secondary to N/V/D prior to admit, possibly from GI illness. (per DO)   Clinical Impression   Pt agreeable to OT and PT co-evaluation. Pt lives independently at baseline with use of SPC during mobility. This date pt required RW due to unsteady balance and general weakness. Pt is able to compete seated ADL tasks well with set up assist needed for toilet hygiene. Pt is generally weak and appears to need a higher level of assist with RW and Min G assist for mobility. Pt reports her sister can help her at home. Pt will benefit from continued OT in the hospital and recommended venue below to increase strength, balance, and endurance for safe ADL's.         Recommendations for follow up therapy are one component of a multi-disciplinary discharge planning process, led by the attending physician.  Recommendations may be updated based on patient status, additional functional criteria and insurance authorization.   Follow Up Recommendations  Home health OT    Assistance Recommended at Discharge Intermittent Supervision/Assistance  Patient can return home with the following A lot of help with walking and/or transfers;A little help with bathing/dressing/bathroom;Assist for transportation;Help with stairs or ramp for entrance    Functional Status Assessment  Patient has had a recent decline in their functional status and demonstrates the ability to make significant improvements in function in a reasonable and predictable amount of time.  Equipment Recommendations  None recommended by OT    Recommendations for Other Services        Precautions / Restrictions Precautions Precautions: Fall Restrictions Weight Bearing Restrictions: No      Mobility Bed Mobility Overal bed mobility: Modified Independent             General bed mobility comments: mild labored movement.    Transfers Overall transfer level: Needs assistance Equipment used: Rolling walker (2 wheels) Transfers: Sit to/from Stand, Bed to chair/wheelchair/BSC Sit to Stand: Supervision, Min guard     Step pivot transfers: Supervision, Min guard     General transfer comment: Labored movement with need of RW which pt does not typically use for ambulation in hall and transfer to toilet.      Balance Overall balance assessment: Needs assistance Sitting-balance support: No upper extremity supported, Feet supported Sitting balance-Leahy Scale: Good Sitting balance - Comments: seated EOB   Standing balance support: Bilateral upper extremity supported, During functional activity Standing balance-Leahy Scale: Fair Standing balance comment: using RW                           ADL either performed or assessed with clinical judgement   ADL Overall ADL's : Needs assistance/impaired     Grooming: Supervision/safety;Standing;Min guard           Upper Body Dressing : Modified independent;Sitting   Lower Body Dressing: Independent;Sitting/lateral leans Lower Body Dressing Details (indicate cue type and reason): Pt able to doff and don R sock seated in chair. Toilet Transfer: Supervision/safety;Min guard;Ambulation Toilet Transfer Details (indicate cue type and reason): EOB to toilet with RW; Toilet to chair. Toileting- Clothing Manipulation and Hygiene:  Set up;Sitting/lateral lean Toileting - Clothing Manipulation Details (indicate cue type and reason): Pt able to complete peri-care after urination with set up assist for getting toilet paper. Pt was seated on BSC.     Functional mobility during ADLs: Supervision/safety;Min  guard;Rolling walker (2 wheels)       Vision Baseline Vision/History: 1 Wears glasses Ability to See in Adequate Light: 0 Adequate Patient Visual Report: No change from baseline (recently had cataracts removed.) Vision Assessment?: No apparent visual deficits                Pertinent Vitals/Pain Pain Assessment Pain Assessment: 0-10 Pain Score: 7  Pain Location: low back Pain Descriptors / Indicators: Sharp Pain Intervention(s): Limited activity within patient's tolerance, Monitored during session, Repositioned     Hand Dominance Right   Extremity/Trunk Assessment Upper Extremity Assessment Upper Extremity Assessment: Generalized weakness   Lower Extremity Assessment Lower Extremity Assessment: Defer to PT evaluation   Cervical / Trunk Assessment Cervical / Trunk Assessment: Normal   Communication Communication Communication: No difficulties   Cognition Arousal/Alertness: Awake/alert Behavior During Therapy: WFL for tasks assessed/performed Overall Cognitive Status: Within Functional Limits for tasks assessed                                                        Home Living Family/patient expects to be discharged to:: Private residence Living Arrangements: Alone Available Help at Discharge: Family;Available PRN/intermittently Type of Home: Apartment Home Access: Stairs to enter Entrance Stairs-Number of Steps: 2 steps (5 steps to get mail) Entrance Stairs-Rails: Left (going up) Home Layout: One level     Bathroom Shower/Tub: Teacher, early years/pre: Standard Bathroom Accessibility: Yes How Accessible: Accessible via walker Home Equipment: [place] - single point;Rolling Walker (2 wheels);Shower seat;Grab bars - tub/shower          Prior Functioning/Environment Prior Level of Function : Independent/Modified Independent;History of Falls (last six months)             Mobility Comments: Indepndent with use  of SPC mostly. Walks with cart to [employer]. ADLs Comments: Indpendent ADL's ; PRN assist for grocery shopping from sister.        OT Problem List: Decreased strength;Decreased activity tolerance;Impaired balance (sitting and/or standing);Pain      OT Treatment/Interventions: Self-care/ADL training;Therapeutic exercise;Therapeutic activities;Patient/family education;Balance training    OT Goals(Current goals can be found in the care plan section) Acute Rehab OT Goals Patient Stated Goal: return home OT Goal Formulation: With patient Time For Goal Achievement: 02/17/22 Potential to Achieve Goals: Good  OT Frequency: Min 1X/week    Co-evaluation PT/OT/SLP Co-Evaluation/Treatment: Yes Reason for Co-Treatment: To address functional/ADL transfers   OT goals addressed during session: ADL's and self-care                       End of Session Equipment Utilized During Treatment: Rolling walker (2 wheels)  Activity Tolerance: Patient tolerated treatment well Patient left: in chair;with call bell/phone within reach  OT Visit Diagnosis: Unsteadiness on feet (R26.81);Other abnormalities of gait and mobility (R26.89);History of falling (Z91.81);Muscle weakness (generalized) (M62.81)                Time: 1497-0263 OT Time Calculation (min): 21 min Charges:  OT General Charges $OT Visit: 1 Visit OT Evaluation $  OT Eval Low Complexity: 1 Low  Cara Thaxton OT, MOT  Larey Seat 02/03/2022, 11:32 AM

## 2022-02-03 NOTE — Progress Notes (Signed)
PROGRESS NOTE    Samantha Mcfarland  KPT:[id number] DOB: 10-Mar-1956 DOA: 02/01/2022 PCP: Loman Brooklyn, FNP   Brief Narrative:    Samantha Mcfarland is a 66 y.o. adult with medical history significant of Breast CA in remission, HTN, HLD. Pt with recent admission from 8/5-8/7 with hyponatremia. During that admit it was felt hyponatremia likely secondary to N/V/D prior to admit, possibly from GI illness.  She was treated with normal saline IV and sent home, but she has recurrent nausea, vomiting, diarrhea and stool pathogen panel has been ordered and is pending.  TSH is within normal limits and nephrology consulted for assistance in management.  She appears euvolemic and therefore IV fluid deferred for now, but it appears that some of this may be related to her fluoxetine use that could be contributing to SIADH versus tea and toast diet.  Fluid restriction ongoing for right now as well as recheck of sodium levels.   Assessment & Plan:   Principal Problem:   Hyponatremia Active Problems:   Nausea vomiting and diarrhea   Breast cancer, left (HCC)   Essential hypertension  Assessment and Plan:   Hyponatremia Acute on chronic hyponatremia likely related to fluoxetine causing SIADH Appreciate nephrology ongoing evaluation TSH and cortisol within normal limits and urine osmolarity inappropriately elevated May have an element of tea and toast diet Continue to hold fluoxetine and fluid restrict and follow labs Anticipate discharge in the next 24-48 hours pending further improvement    Nausea vomiting and diarrhea Improving and could be related to hyponatremia No further diarrhea noted, discontinue enteric precautions   Essential hypertension Hold home lisinopril for the moment; blood pressures are stable   Breast cancer, left (HCC) Continue anastrazole EDP ordered CT head. -CT head with no acute findings  Back pain/restless legs Currently on oxycodone and gabapentin PT evaluation pending for  weakness     DVT prophylaxis: Lovenox Code Status: Full Family Communication: None at bedside Disposition Plan:  Status is: Inpatient Remains inpatient appropriate because: Ongoing need for close monitoring of sodium levels.  Consultants:  Nephrology  Procedures:  None  Antimicrobials:  None   Subjective: Patient seen and evaluated today with some ongoing low back pain and restless legs, but this is improving.  She denies any further nausea, vomiting, or diarrhea.  Tolerating diet.  Objective: Vitals:   02/02/22 0512 02/02/22 1401 02/02/22 2044 02/03/22 0543  BP: 115/68 113/71 111/69 136/78  Pulse: 64 70 60 (!) 56  Resp: '16 16 20   '$ Temp: 98 F (36.7 C) 98.4 F (36.9 C) 98.2 F (36.8 C) 98 F (36.7 C)  TempSrc: Oral Oral Oral Oral  SpO2: 100% 100% 100% 100%  Weight:      Height:        Intake/Output Summary (Last 24 hours) at 02/03/2022 1020 Last data filed at 02/02/2022 1800 Gross per 24 hour  Intake 240 ml  Output 750 ml  Net -510 ml   Filed Weights   02/01/22 2329  Weight: 50.9 kg    Examination:  General exam: Appears calm and comfortable  Respiratory system: Clear to auscultation. Respiratory effort normal. Cardiovascular system: S1 & S2 heard, RRR.  Gastrointestinal system: Abdomen is soft Central nervous system: Alert and awake Extremities: No edema Skin: No significant lesions noted Psychiatry: Flat affect.    Data Reviewed: I have personally reviewed following labs and imaging studies  CBC: Recent Labs  Lab 02/02/22 0014 02/02/22 0550  WBC 4.7 7.2  NEUTROABS 3.0  --  HGB 11.1* 10.9*  HCT 30.8* 29.9*  MCV 92.5 92.9  PLT 379 355   Basic Metabolic Panel: Recent Labs  Lab 02/02/22 0014 02/02/22 0550 02/02/22 1153 02/02/22 1744 02/03/22 0017 02/03/22 0539  NA 116* 118* 116* 117* 117* 120*  K 4.7 4.5 4.3 3.4* 3.5 4.2  CL 84* 89* 87* 88* 89* 89*  CO2 '23 23 23 '$ 21* 22 24  GLUCOSE 92 86 87 72 87 82  BUN 6* 6* 5* 5* 5* 5*   CREATININE 0.44 0.45 0.53 0.50 0.46 0.55  CALCIUM 8.4* 8.4* 8.6* 8.5* 8.3* 8.9  MG 1.7  --   --   --  1.8  --    GFR: Estimated Creatinine Clearance: 56.3 mL/min (by C-G formula based on SCr of 0.55 mg/dL). Liver Function Tests: Recent Labs  Lab 02/02/22 0014  AST 32  ALT 30  ALKPHOS 38  BILITOT 0.3  PROT 6.7  ALBUMIN 4.1   Recent Labs  Lab 02/02/22 0014  LIPASE 41   No results for input(s): "AMMONIA" in the last 168 hours. Coagulation Profile: No results for input(s): "INR", "PROTIME" in the last 168 hours. Cardiac Enzymes: No results for input(s): "CKTOTAL", "CKMB", "CKMBINDEX", "TROPONINI" in the last 168 hours. BNP (last 3 results) No results for input(s): "PROBNP" in the last 8760 hours. HbA1C: No results for input(s): "HGBA1C" in the last 72 hours. CBG: No results for input(s): "GLUCAP" in the last 168 hours. Lipid Profile: No results for input(s): "CHOL", "HDL", "LDLCALC", "TRIG", "CHOLHDL", "LDLDIRECT" in the last 72 hours. Thyroid Function Tests: Recent Labs    02/02/22 0738  TSH 1.168   Anemia Panel: No results for input(s): "VITAMINB12", "FOLATE", "FERRITIN", "TIBC", "IRON", "RETICCTPCT" in the last 72 hours. Sepsis Labs: No results for input(s): "PROCALCITON", "LATICACIDVEN" in the last 168 hours.  No results found for this or any previous visit (from the past 240 hour(s)).       Radiology Studies: CT Head Wo Contrast  Result Date: 02/02/2022 CLINICAL DATA:  Dizziness and recent falls, initial encounter EXAM: CT HEAD WITHOUT CONTRAST TECHNIQUE: Contiguous axial images were obtained from the base of the skull through the vertex without intravenous contrast. RADIATION DOSE REDUCTION: This exam was performed according to the departmental dose-optimization program which includes automated exposure control, adjustment of the mA and/or kV according to patient size and/or use of iterative reconstruction technique. COMPARISON:  None Available. FINDINGS:  Brain: No evidence of acute infarction, hemorrhage, hydrocephalus, extra-axial collection or mass lesion/mass effect. Vascular: No hyperdense vessel or unexpected calcification. Skull: Normal. Negative for fracture or focal lesion. Sinuses/Orbits: No acute finding. Other: None. IMPRESSION: No acute intracranial abnormality no Electronically Signed   By: Inez Catalina M.D.   On: 02/02/2022 02:13        Scheduled Meds:  anastrozole  1 mg Oral Daily   enoxaparin (LOVENOX) injection  40 mg Subcutaneous Q24H   gabapentin  300 mg Oral BID     LOS: 1 day    Time spent: 35 minutes    Gilles Trimpe Darleen Crocker, DO [facility]  If 7PM-7AM, please contact night-coverage [email or web address] 02/03/2022, 10:20 AM

## 2022-02-03 NOTE — Evaluation (Signed)
Physical Therapy Evaluation Patient Details Name: Samantha Mcfarland MRN: [id number] DOB: 20-May-1956 Today's Date: 02/03/2022  History of Present Illness  Samantha Mcfarland is a 66 y.o. adult with medical history significant of Breast CA in remission, HTN, HLD.     Pt with recent admission from 8/5-8/7 with hyponatremia.     During that admit it was felt hyponatremia likely secondary to N/V/D prior to admit, possibly from GI illness.   Clinical Impression  Patient demonstrates good return for sitting up at bedside, transferring to/from [facility], commode in bathroom and chair.  Patient able to ambulate in hallway using RW with Min guard assist without loss of balance, limited mostly due to fatigue and tolerated sitting up in chair after therapy.  Patient will benefit from continued skilled physical therapy in hospital and recommended venue below to increase strength, balance, endurance for safe ADLs and gait.      Recommendations for follow up therapy are one component of a multi-disciplinary discharge planning process, led by the attending physician.  Recommendations may be updated based on patient status, additional functional criteria and insurance authorization.  Follow Up Recommendations Home health PT      Assistance Recommended at Discharge Set up Supervision/Assistance  Patient can return home with the following  A little help with walking and/or transfers;A little help with bathing/dressing/bathroom;Help with stairs or ramp for entrance;Assistance with cooking/housework    Equipment Recommendations None recommended by PT  Recommendations for Other Services       Functional Status Assessment Patient has had a recent decline in their functional status and demonstrates the ability to make significant improvements in function in a reasonable and predictable amount of time.     Precautions / Restrictions Precautions Precautions: Fall Restrictions Weight Bearing Restrictions: No      Mobility   Bed Mobility Overal bed mobility: Modified Independent                  Transfers Overall transfer level: Needs assistance Equipment used: Rolling walker (2 wheels) Transfers: Sit to/from Stand, Bed to chair/wheelchair/BSC Sit to Stand: Supervision, Min guard   Step pivot transfers: Supervision, Min guard       General transfer comment: as per OT notes    Ambulation/Gait Ambulation/Gait assistance: Min guard Gait Distance (Feet): 65 Feet Assistive device: Rolling walker (2 wheels) Gait Pattern/deviations: Decreased step length - right, Decreased step length - left, Decreased stride length Gait velocity: decreased     General Gait Details: slow slightly labored cadence without loss of balance, limited mostly due to fatigue  Stairs            Wheelchair Mobility    Modified Rankin (Stroke Patients Only)       Balance Overall balance assessment: Needs assistance Sitting-balance support: Feet supported, No upper extremity supported Sitting balance-Leahy Scale: Good Sitting balance - Comments: seated EOB   Standing balance support: Bilateral upper extremity supported, During functional activity Standing balance-Leahy Scale: Fair Standing balance comment: using RW                             Pertinent Vitals/Pain Pain Assessment Pain Assessment: 0-10 Pain Score: 7  Pain Location: low back Pain Descriptors / Indicators: Sharp Pain Intervention(s): Limited activity within patient's tolerance, Monitored during session, Repositioned    Home Living Family/patient expects to be discharged to:: Private residence Living Arrangements: Alone Available Help at Discharge: Family;Available PRN/intermittently Type of Home: Apartment Home Access: Stairs to  enter Entrance Stairs-Rails: Left Entrance Stairs-Number of Steps: 2 steps   Home Layout: One level Home Equipment: Cane - quad;Cane - single Barista (2 wheels);Shower seat;Grab bars  - tub/shower      Prior Function Prior Level of Function : Independent/Modified Independent;History of Falls (last six months)             Mobility Comments: Indepndent with use of SPC mostly. Walks with cart to [employer]. ADLs Comments: Indpendent ADL's ; PRN assist for grocery shopping from sister.     Hand Dominance   Dominant Hand: Right    Extremity/Trunk Assessment   Upper Extremity Assessment Upper Extremity Assessment: Defer to OT evaluation    Lower Extremity Assessment Lower Extremity Assessment: Generalized weakness    Cervical / Trunk Assessment Cervical / Trunk Assessment: Normal  Communication   Communication: No difficulties  Cognition Arousal/Alertness: Awake/alert Behavior During Therapy: WFL for tasks assessed/performed Overall Cognitive Status: Within Functional Limits for tasks assessed                                          General Comments      Exercises     Assessment/Plan    PT Assessment Patient needs continued PT services  PT Problem List Decreased strength;Decreased activity tolerance;Decreased balance;Decreased mobility       PT Treatment Interventions DME instruction;Gait training;Functional mobility training;Therapeutic activities;Stair training;Therapeutic exercise;Patient/family education;Balance training    PT Goals (Current goals can be found in the Care Plan section)  Acute Rehab PT Goals Patient Stated Goal: return home with family to assist PT Goal Formulation: With patient Time For Goal Achievement: 02/10/22 Potential to Achieve Goals: Good    Frequency Min 3X/week     Co-evaluation PT/OT/SLP Co-Evaluation/Treatment: Yes Reason for Co-Treatment: To address functional/ADL transfers PT goals addressed during session: Mobility/safety with mobility;Balance;Proper use of DME OT goals addressed during session: ADL's and self-care       AM-PAC PT "6 Clicks" Mobility  Outcome Measure Help  needed turning from your back to your side while in a flat bed without using bedrails?: None Help needed moving from lying on your back to sitting on the side of a flat bed without using bedrails?: None Help needed moving to and from a bed to a chair (including a wheelchair)?: A Little Help needed standing up from a chair using your arms (e.g., wheelchair or bedside chair)?: A Little Help needed to walk in hospital room?: A Little Help needed climbing 3-5 steps with a railing? : A Lot 6 Click Score: 19    End of Session   Activity Tolerance: Patient tolerated treatment well;Patient limited by fatigue Patient left: in chair;with call bell/phone within reach Nurse Communication: Mobility status PT Visit Diagnosis: Unsteadiness on feet (R26.81);Other abnormalities of gait and mobility (R26.89);Muscle weakness (generalized) (M62.81)    Time: 3846-6599 PT Time Calculation (min) (ACUTE ONLY): 26 min   Charges:   PT Evaluation $PT Eval Moderate Complexity: 1 Mod PT Treatments $Therapeutic Activity: 23-37 mins        2:24 PM, 02/03/22 Lonell Grandchild, MPT Physical Therapist with [facility] [phone] office [phone] mobile phone

## 2022-02-03 NOTE — TOC Progression Note (Signed)
Transition of Care [facility]) - Progression Note    Patient Details  Name: Samantha Mcfarland MRN: [id number] Date of Birth: Feb 03, 1956  Transition [facility] Care [facility] CM/SW Contact  Salome Arnt, [place] Phone Number: 02/03/2022, 11:20 AM  Clinical Narrative:  PT recommending home health. Discussed with pt who refuses because of her dog not liking new people in the home. Offered to refer pt to outpatient physical therapy. Pt states she will talk to her sister tonight to see if she would be willing to provide transportation. TOC will follow up tomorrow.        Barriers to Discharge: Continued Medical Work up  Expected Discharge Plan and Services                                                 Social Determinants of Health (SDOH) Interventions    Readmission Risk Interventions     No data to display

## 2022-02-03 NOTE — Consult Note (Signed)
[facility] Consultation Oncology  Name: Samantha Mcfarland      MRN: [id number]    Location: U384/T364-68  Date: 02/03/2022 Time:5:37 PM   REFERRING PHYSICIAN: Dr. Manuella Ghazi  REASON FOR CONSULT: Left breast cancer   DIAGNOSIS: Hyponatremia  HISTORY OF PRESENT ILLNESS: Samantha Mcfarland is a 66 year old very pleasant female known to me from outpatient visits.  She was admitted to the hospital with nausea, vomiting and diarrhea for 3 days on 01/23/2022 and was found to have severe hyponatremia of 114.  She was evaluated by nephrology.  Thought to be secondary to fluoxetine contributing to SIADH.  Fluoxetine has been on hold.  Nausea vomiting and diarrhea have improved.  She is on anastrozole for her breast cancer which she is continuing.  PAST MEDICAL HISTORY:   Past Medical History:  Diagnosis Date   Allergy    Anxiety    Breast cancer ([place])    Depression    Family history of breast cancer    Family history of colon cancer    Family history of pancreatic cancer    Family history of thyroid cancer    GERD (gastroesophageal reflux disease)    Hyperlipidemia    Hypertension    Osteopenia 04/30/2021   Port-A-Cath in place 05/07/2021   Vitamin D insufficiency     ALLERGIES: Allergies  Allergen Reactions   Ibuprofen Other (See Comments)    "Stomach upset"   Penicillins Rash      MEDICATIONS: I have reviewed the patient's current medications.     PAST SURGICAL HISTORY Past Surgical History:  Procedure Laterality Date   EXCISION OF SKIN TAG Right 05/27/2021   Procedure: EXCISION OF RIGHT SKIN TAG;  Surgeon: Virl Cagey, MD;  Location: [facility];  Service: General;  Laterality: Right;   MASTECTOMY W/ SENTINEL NODE BIOPSY Left 03/23/2021   Procedure: TOTAL MASTECTOMY WITH SENTINEL LYMPH NODE BIOPSY;  Surgeon: Virl Cagey, MD;  Location: [facility];  Service: General;  Laterality: Left;   NECK SURGERY     OOPHORECTOMY     PORT-A-CATH REMOVAL Right 09/23/2021   Procedure: MINOR REMOVAL  PORT-A-CATH;  Surgeon: Virl Cagey, MD;  Location: [facility];  Service: General;  Laterality: Right;   PORTACATH PLACEMENT Right 05/27/2021   Procedure: INSERTION PORT-A-CATH;  Surgeon: Virl Cagey, MD;  Location: [facility];  Service: General;  Laterality: Right;   SALPINGECTOMY      FAMILY HISTORY: Family History  Problem Relation Age of Onset   Stroke Mother    Alcohol abuse Mother    Diabetes Father    Pancreatic cancer Father 45   Testicular cancer Father        dx 39s   Pancreatic disease Father    Breast cancer Maternal Aunt        dx 50s-60s   Breast cancer Paternal Aunt 77   Thyroid cancer Paternal Uncle    Breast cancer Maternal Grandmother        dx 49s   Colon cancer Paternal Grandfather        dx 24s    SOCIAL HISTORY:  reports that she has quit smoking. Her smoking use included cigarettes. She smoked an average of .25 packs per day. She has never used smokeless tobacco. She reports current alcohol use of about 20.0 standard drinks of alcohol per week. She reports that she does not use drugs.  PERFORMANCE STATUS: The patient's performance status is 1 - Symptomatic but completely ambulatory  PHYSICAL EXAM: Most Recent Vital  Signs: Blood pressure 112/70, pulse (!) 59, temperature 97.8 F (36.6 C), temperature source Oral, resp. rate 17, height 5' 6" (1.676 m), weight 112 lb 3.4 oz (50.9 kg), SpO2 100 %. BP 112/70 (BP Location: Right Arm)   Pulse (!) 59   Temp 97.8 F (36.6 C) (Oral)   Resp 17   Ht 5' 6" (1.676 m)   Wt 112 lb 3.4 oz (50.9 kg)   SpO2 100%   BMI 18.11 kg/m  General appearance: alert, cooperative, and appears stated age Extremities: extremities normal, atraumatic, no cyanosis or edema Neurologic: Grossly normal  LABORATORY DATA:  Results for orders placed or performed during the hospital encounter of 02/01/22 (from the past 48 hour(s))  CBC with Differential     Status: Abnormal   Collection Time: 02/02/22 12:14 AM  Result Value Ref  Range   WBC 4.7 4.0 - 10.5 K/uL   RBC 3.33 (L) 3.87 - 5.11 MIL/uL   Hemoglobin 11.1 (L) 12.0 - 15.0 g/dL   HCT 30.8 (L) 36.0 - 46.0 %   MCV 92.5 80.0 - 100.0 fL   MCH 33.3 26.0 - 34.0 pg   MCHC 36.0 30.0 - 36.0 g/dL   RDW 13.6 11.5 - 15.5 %   Platelets 379 150 - 400 K/uL   nRBC 0.0 0.0 - 0.2 %   Neutrophils Relative % 65 %   Neutro Abs 3.0 1.7 - 7.7 K/uL   Lymphocytes Relative 21 %   Lymphs Abs 1.0 0.7 - 4.0 K/uL   Monocytes Relative 10 %   Monocytes Absolute 0.5 0.1 - 1.0 K/uL   Eosinophils Relative 3 %   Eosinophils Absolute 0.2 0.0 - 0.5 K/uL   Basophils Relative 0 %   Basophils Absolute 0.0 0.0 - 0.1 K/uL   Immature Granulocytes 1 %   Abs Immature Granulocytes 0.05 0.00 - 0.07 K/uL    Comment: Performed at [facility], [address], [place], [place] [address]  Comprehensive metabolic panel     Status: Abnormal   Collection Time: 02/02/22 12:14 AM  Result Value Ref Range   Sodium 116 (LL) 135 - 145 mmol/L    Comment: CRITICAL RESULT CALLED TO, READ BACK BY AND VERIFIED WITH: RUSH,C_0  BY MATTHEWS,B 8.15.2023    Potassium 4.7 3.5 - 5.1 mmol/L   Chloride 84 (L) 98 - 111 mmol/L   CO2 23 22 - 32 mmol/L   Glucose, Bld 92 70 - 99 mg/dL    Comment: Glucose reference range applies only to samples taken after fasting for at least 8 hours.   BUN 6 (L) 8 - 23 mg/dL   Creatinine, Ser 0.44 0.44 - 1.00 mg/dL   Calcium 8.4 (L) 8.9 - 10.3 mg/dL   Total Protein 6.7 6.5 - 8.1 g/dL   Albumin 4.1 3.5 - 5.0 g/dL   AST 32 15 - 41 U/L   ALT 30 0 - 44 U/L   Alkaline Phosphatase 38 38 - 126 U/L   Total Bilirubin 0.3 0.3 - 1.2 mg/dL   GFR, Estimated >60 >60 mL/min    Comment: (NOTE) Calculated using the CKD-EPI Creatinine Equation (2021)    Anion gap 9 5 - 15    Comment: Performed at [facility], [address] [place], [place] [address]  Lipase, blood     Status: None   Collection Time: 02/02/22 12:14 AM  Result Value Ref Range   Lipase 41 11 - 51 U/L    Comment: Performed at  [facility], [place]  [address] [place], [place] [address]  Osmolality     Status: Abnormal   Collection Time: 02/02/22 12:14 AM  Result Value Ref Range   Osmolality 250 (L) 275 - 295 mOsm/kg    Comment: Performed at [facility], [place] [address], [place], [place] [address]  Magnesium     Status: None   Collection Time: 02/02/22 12:14 AM  Result Value Ref Range   Magnesium 1.7 1.7 - 2.4 mg/dL    Comment: Performed at [facility], [address] [place], [place] [address]  Urinalysis, Routine w reflex microscopic Urine, Clean Catch     Status: Abnormal   Collection Time: 02/02/22  2:16 AM  Result Value Ref Range   Color, Urine STRAW (A) YELLOW   APPearance CLEAR CLEAR   Specific Gravity, Urine 1.004 (L) 1.005 - 1.030   pH 8.0 5.0 - 8.0   Glucose, UA NEGATIVE NEGATIVE mg/dL   Hgb urine dipstick NEGATIVE NEGATIVE   Bilirubin Urine NEGATIVE NEGATIVE   Ketones, ur NEGATIVE NEGATIVE mg/dL   Protein, ur NEGATIVE NEGATIVE mg/dL   Nitrite NEGATIVE NEGATIVE   Leukocytes,Ua TRACE (A) NEGATIVE   RBC / HPF 0-5 0 - 5 RBC/hpf   WBC, UA 0-5 0 - 5 WBC/hpf   Bacteria, UA NONE SEEN NONE SEEN   Squamous Epithelial / LPF 0-5 0 - 5   Mucus PRESENT     Comment: Performed at [facility], [address] [place], [place] [address]  Sodium, urine, random     Status: None   Collection Time: 02/02/22  2:16 AM  Result Value Ref Range   Sodium, Ur 65 mmol/L    Comment: Performed at [facility], [address], [place], [place] [address]  Creatinine, urine, random     Status: None   Collection Time: 02/02/22  2:16 AM  Result Value Ref Range   Creatinine, Urine 11.59 mg/dL    Comment: Performed at [facility], [address] [place], [place] [address]  Osmolality, urine     Status: Abnormal   Collection Time: 02/02/22  2:25 AM  Result Value Ref Range   Osmolality, Ur 199 (L) 300 - 900 mOsm/kg    Comment: Performed at [place] [address], [place], [place] [address]  Basic metabolic  panel     Status: Abnormal   Collection Time: 02/02/22  5:50 AM  Result Value Ref Range   Sodium 118 (LL) 135 - 145 mmol/L    Comment: CRITICAL RESULT CALLED TO, READ BACK BY AND VERIFIED WITH: CHRISTY DAVENPORT @ 0720 ON 02/02/22 C VARNER    Potassium 4.5 3.5 - 5.1 mmol/L   Chloride 89 (L) 98 - 111 mmol/L   CO2 23 22 - 32 mmol/L   Glucose, Bld 86 70 - 99 mg/dL    Comment: Glucose reference range applies only to samples taken after fasting for at least 8 hours.   BUN 6 (L) 8 - 23 mg/dL   Creatinine, Ser 0.45 0.44 - 1.00 mg/dL   Calcium 8.4 (L) 8.9 - 10.3 mg/dL   GFR, Estimated >60 >60 mL/min    Comment: (NOTE) Calculated using the CKD-EPI Creatinine Equation (2021)    Anion gap 6 5 - 15    Comment: Performed at [facility], [address], [place], [place] [address]  CBC     Status: Abnormal   Collection Time: 02/02/22  5:50 AM  Result Value Ref Range   WBC 7.2 4.0 - 10.5 K/uL   RBC  3.22 (L) 3.87 - 5.11 MIL/uL   Hemoglobin 10.9 (L) 12.0 - 15.0 g/dL   HCT 29.9 (L) 36.0 - 46.0 %   MCV 92.9 80.0 - 100.0 fL   MCH 33.9 26.0 - 34.0 pg   MCHC 36.5 (H) 30.0 - 36.0 g/dL   RDW 13.5 11.5 - 15.5 %   Platelets 368 150 - 400 K/uL   nRBC 0.0 0.0 - 0.2 %    Comment: Performed at [facility], [address], [place], [place] [address]  Cortisol-am, blood     Status: None   Collection Time: 02/02/22  5:50 AM  Result Value Ref Range   Cortisol - AM 16.4 6.7 - 22.6 ug/dL    Comment: Performed at [facility], [place] [address] [place], [place] [address]  TSH     Status: None   Collection Time: 02/02/22  7:38 AM  Result Value Ref Range   TSH 1.168 0.350 - 4.500 uIU/mL    Comment: Performed by a 3rd Generation assay with a functional sensitivity of <=0.01 uIU/mL. Performed at [facility], [address], [place], [place] [address]   Basic metabolic panel     Status: Abnormal   Collection Time: 02/02/22 11:53 AM  Result Value Ref Range   Sodium 116 (LL) 135 - 145 mmol/L    Comment:  CRITICAL RESULT CALLED TO, READ BACK BY AND VERIFIED WITH: CHRISTY DAVENPORT @ 1256 ON 02/02/22 C VARNER    Potassium 4.3 3.5 - 5.1 mmol/L   Chloride 87 (L) 98 - 111 mmol/L   CO2 23 22 - 32 mmol/L   Glucose, Bld 87 70 - 99 mg/dL    Comment: Glucose reference range applies only to samples taken after fasting for at least 8 hours.   BUN 5 (L) 8 - 23 mg/dL   Creatinine, Ser 0.53 0.44 - 1.00 mg/dL   Calcium 8.6 (L) 8.9 - 10.3 mg/dL   GFR, Estimated >60 >60 mL/min    Comment: (NOTE) Calculated using the CKD-EPI Creatinine Equation (2021)    Anion gap 6 5 - 15    Comment: Performed at [facility], [address], [place], [place] [address]  Osmolality, urine     Status: None   Collection Time: 02/02/22  2:29 PM  Result Value Ref Range   Osmolality, Ur 376 300 - 900 mOsm/kg    Comment: Performed at [place] [address], [place], [place] [address]  Basic metabolic panel     Status: Abnormal   Collection Time: 02/02/22  5:44 PM  Result Value Ref Range   Sodium 117 (LL) 135 - 145 mmol/L    Comment: CRITICAL RESULT CALLED TO, READ BACK BY AND VERIFIED WITH: DAVENPORT,C ON 02/02/22 AT 1850 BY LOY,C    Potassium 3.4 (L) 3.5 - 5.1 mmol/L    Comment: DELTA CHECK NOTED   Chloride 88 (L) 98 - 111 mmol/L   CO2 21 (L) 22 - 32 mmol/L   Glucose, Bld 72 70 - 99 mg/dL    Comment: Glucose reference range applies only to samples taken after fasting for at least 8 hours.   BUN 5 (L) 8 - 23 mg/dL   Creatinine, Ser 0.50 0.44 - 1.00 mg/dL   Calcium 8.5 (L) 8.9 - 10.3 mg/dL   GFR, Estimated >60 >60 mL/min    Comment: (NOTE) Calculated using the CKD-EPI Creatinine Equation (2021)    Anion gap 8 5 - 15    Comment: Performed at [facility]  [facility], [address], [place], [place] [address]  Basic metabolic panel     Status: Abnormal   Collection Time: 02/03/22 12:17 AM  Result Value Ref Range   Sodium 117 (LL) 135 - 145 mmol/L    Comment: CRITICAL VALUE NOTED.  VALUE IS CONSISTENT WITH PREVIOUSLY  REPORTED AND CALLED VALUE.   Potassium 3.5 3.5 - 5.1 mmol/L   Chloride 89 (L) 98 - 111 mmol/L   CO2 22 22 - 32 mmol/L   Glucose, Bld 87 70 - 99 mg/dL    Comment: Glucose reference range applies only to samples taken after fasting for at least 8 hours.   BUN 5 (L) 8 - 23 mg/dL   Creatinine, Ser 0.46 0.44 - 1.00 mg/dL   Calcium 8.3 (L) 8.9 - 10.3 mg/dL   GFR, Estimated >60 >60 mL/min    Comment: (NOTE) Calculated using the CKD-EPI Creatinine Equation (2021)    Anion gap 6 5 - 15    Comment: Performed at [facility], [address] [place], [place] [address]  Magnesium     Status: None   Collection Time: 02/03/22 12:17 AM  Result Value Ref Range   Magnesium 1.8 1.7 - 2.4 mg/dL    Comment: Performed at [facility], [address] [place], [place] [address]  Basic metabolic panel     Status: Abnormal   Collection Time: 02/03/22  5:39 AM  Result Value Ref Range   Sodium 120 (L) 135 - 145 mmol/L   Potassium 4.2 3.5 - 5.1 mmol/L   Chloride 89 (L) 98 - 111 mmol/L   CO2 24 22 - 32 mmol/L   Glucose, Bld 82 70 - 99 mg/dL    Comment: Glucose reference range applies only to samples taken after fasting for at least 8 hours.   BUN 5 (L) 8 - 23 mg/dL   Creatinine, Ser 0.55 0.44 - 1.00 mg/dL   Calcium 8.9 8.9 - 10.3 mg/dL   GFR, Estimated >60 >60 mL/min    Comment: (NOTE) Calculated using the CKD-EPI Creatinine Equation (2021)    Anion gap 7 5 - 15    Comment: Performed at [facility], [address] [place], [place] [address]  Basic metabolic panel     Status: Abnormal   Collection Time: 02/03/22  3:40 PM  Result Value Ref Range   Sodium 117 (LL) 135 - 145 mmol/L    Comment: CRITICAL RESULT CALLED TO, READ BACK BY AND VERIFIED WITH: RAY,A ON 02/03/22 AT 1615 BY LOY,C    Potassium 4.1 3.5 - 5.1 mmol/L   Chloride 85 (L) 98 - 111 mmol/L   CO2 25 22 - 32 mmol/L   Glucose, Bld 104 (H) 70 - 99 mg/dL    Comment: Glucose reference range applies only to samples taken after fasting for at  least 8 hours.   BUN 8 8 - 23 mg/dL   Creatinine, Ser 0.61 0.44 - 1.00 mg/dL   Calcium 8.9 8.9 - 10.3 mg/dL   GFR, Estimated >60 >60 mL/min    Comment: (NOTE) Calculated using the CKD-EPI Creatinine Equation (2021)    Anion gap 7 5 - 15    Comment: Performed at [facility], [address] [place], [place] [address]      RADIOGRAPHY: CT Head Wo Contrast  Result Date: 02/02/2022 CLINICAL DATA:  Dizziness and recent falls, initial encounter EXAM: CT HEAD WITHOUT CONTRAST TECHNIQUE: Contiguous axial images were obtained from the base of the skull through the vertex without intravenous contrast. RADIATION  DOSE REDUCTION: This exam was performed according to the departmental dose-optimization program which includes automated exposure control, adjustment of the mA and/or kV according to patient size and/or use of iterative reconstruction technique. COMPARISON:  None Available. FINDINGS: Brain: No evidence of acute infarction, hemorrhage, hydrocephalus, extra-axial collection or mass lesion/mass effect. Vascular: No hyperdense vessel or unexpected calcification. Skull: Normal. Negative for fracture or focal lesion. Sinuses/Orbits: No acute finding. Other: None. IMPRESSION: No acute intracranial abnormality no Electronically Signed   By: Inez Catalina M.D.   On: 02/02/2022 02:13         ASSESSMENT:  1.  Stage I (T1CN0) left breast IDC, ER positive, HER2 negative: - Left mastectomy and SLNB on 03/23/2021.  Oncotype DX score 33. - 4 cycles of chemotherapy with TC from 05/28/2021 through 08/06/2021. - [facility] genetic testing was negative. - Anastrozole started on 08/27/2021.  PLAN:  1.  Stage I left breast cancer: - Continue anastrozole.  She has a follow-up appointment with me in the office in October.  2.  Hyponatremia: - Initially presented with nausea, vomiting and diarrhea for 3 days and with sodium of 114. - GI symptoms have resolved. - CT head without contrast on 02/02/2022 was negative. -  Hyponatremia thought to be secondary to SIADH from fluoxetine. - She is on fluid restriction.  Sodium today is 117.  She is seen by Dr. Moshe Cipro of nephrology. - At this time I do not feel that it is malignancy related.  However if her sodium does not improve, will consider further imaging.  All questions were answered. The patient knows to call the clinic with any problems, questions or concerns. We can certainly see the patient much sooner if necessary.   Derek Jack

## 2022-02-03 NOTE — Plan of Care (Signed)
  Problem: Acute Rehab PT Goals(only PT should resolve) Goal: Pt Will Go Supine/Side To Sit Outcome: Progressing Flowsheets (Taken 02/03/2022 1425) Pt will go Supine/Side to Sit:  Independently  with modified independence Goal: Patient Will Transfer Sit To/From Stand Outcome: Progressing Flowsheets (Taken 02/03/2022 1425) Patient will transfer sit to/from stand:  with modified independence  with supervision Goal: Pt Will Transfer Bed To Chair/Chair To Bed Outcome: Progressing Flowsheets (Taken 02/03/2022 1425) Pt will Transfer Bed to Chair/Chair to Bed:  with modified independence  with supervision Goal: Pt Will Ambulate Outcome: Progressing Flowsheets (Taken 02/03/2022 1425) Pt will Ambulate:  100 feet  with rolling walker  with cane  with supervision  with modified independence   2:26 PM, 02/03/22 Lonell Grandchild, MPT Physical Therapist with [facility] [phone] office [phone] mobile phone

## 2022-02-03 NOTE — Plan of Care (Signed)
  Problem: Acute Rehab OT Goals (only OT should resolve) Goal: Pt. Will Perform Grooming Flowsheets (Taken 02/03/2022 1134) Pt Will Perform Grooming:  with modified independence  standing Goal: Pt. Will Perform Lower Body Bathing Flowsheets (Taken 02/03/2022 1134) Pt Will Perform Lower Body Bathing:  with modified independence  sit to/from stand Goal: Pt. Will Transfer To Toilet Flowsheets (Taken 02/03/2022 1134) Pt Will Transfer to Toilet:  with modified independence  ambulating Goal: Pt/Caregiver Will Perform Home Exercise Program Flowsheets (Taken 02/03/2022 1134) Pt/caregiver will Perform Home Exercise Program:  Increased strength  Both right and left upper extremity  Independently  Britten Seyfried OT, MOT

## 2022-02-04 DIAGNOSIS — E871 Hypo-osmolality and hyponatremia: Secondary | ICD-10-CM | POA: Diagnosis not present

## 2022-02-04 LAB — BASIC METABOLIC PANEL
Anion gap: 6 (ref 5–15)
BUN: 7 mg/dL — ABNORMAL LOW (ref 8–23)
CO2: 26 mmol/L (ref 22–32)
Calcium: 9.3 mg/dL (ref 8.9–10.3)
Chloride: 94 mmol/L — ABNORMAL LOW (ref 98–111)
Creatinine, Ser: 0.59 mg/dL (ref 0.44–1.00)
GFR, Estimated: >60 mL/min (ref >60)
Glucose, Bld: 80 mg/dL (ref 70–99)
Potassium: 4.4 mmol/L (ref 3.5–5.1)
Sodium: 126 mmol/L — ABNORMAL LOW (ref 135–145)

## 2022-02-04 MED ORDER — OXYCODONE HCL 5 MG PO TABS: 5.0000 mg | ORAL_TABLET | Freq: Every evening | ORAL | 0 refills | Status: DC | PRN

## 2022-02-04 MED ORDER — TRAMADOL HCL 50 MG PO TABS: 50.0000 mg | ORAL_TABLET | Freq: Four times a day (QID) | ORAL | 0 refills | Status: DC | PRN

## 2022-02-04 NOTE — Plan of Care (Signed)

## 2022-02-04 NOTE — Consult Note (Addendum)
   [facility] Inpatient Consult   02/04/2022  Moni Rothrock [birth year] [id number]  [place] Organization [ACO] Patient: [facility] Liaison Remote coverage for patient at [facility]  Primary Care Provider:  Loman Brooklyn, FNP, [place], is an embedded provider with a Chronic Care Management team and program, and is listed for the transition of care follow up and appointments.  Addendum: Was able to speak with patient via phone listed in [place] and HIPAA verified by 2 identifiers. Explained to patient regarding post hospital follow up call and may be offered additional follow up services. Patient was gracious, states "just left the hospital and doing ok."  Patient was screened for less than 7 days readmission and to assess for Embedded practice service needs for chronic care management/care coordination post hospital.  Review of inpatient [facility] LCSW team notes for barrier to care as patient already transitioned.  Patient decline HH but to consider OP rehab if family will provide her transportation noted in progress notes. for PT evaluation of needs, as well.  Plan: A referral request for care coordination follow up for readmission prevention.  Please contact for further questions,  Natividad Brood, RN BSN [facility] Liaison  [phone] business mobile phone Toll free office [phone]  Fax number: [phone] [email or web address]

## 2022-02-04 NOTE — Discharge Summary (Addendum)
Physician Discharge Summary  Samantha Mcfarland DQQ:[id number] DOB: 1955-11-26 DOA: 02/01/2022  PCP: Loman Brooklyn, FNP  Admit date: 02/01/2022  Discharge date: 02/04/2022  Admitted From:Home  Disposition:  Home  Recommendations for Outpatient Follow-up:  Follow up with PCP in 5-7 days to recheck serum sodium levels Discontinue fluoxetine given SIADH Fluid restriction of 2 L daily Hold lisinopril for now given some softer BP readings, recheck blood pressure outpatient and resume as needed Continue other home medications as prior Follow-up with Dr. Delton Coombes scheduled in October  Home Health: Declines home health PT  Equipment/Devices: None  Discharge Condition:Stable  CODE STATUS: Full  Diet recommendation: Heart Healthy, 2 L fluid restriction  Brief/Interim Summary: Samantha Mcfarland is a 66 y.o. adult with medical history significant of Breast CA in remission, HTN, HLD. Pt with recent admission from 8/5-8/7 with hyponatremia. During that admit it was felt hyponatremia likely secondary to N/V/D prior to admit, possibly from GI illness.  She was treated with normal saline IV and sent home, but she has recurrent nausea, vomiting, diarrhea and weakness which led to a fall at home.  TSH and cortisol within normal limits per nephrology evaluation and it is thought that she was having SIADH related to fluoxetine use.  She otherwise appears euvolemic and may even have a component of tea and toast diet.  She is stable for discharge today and reports no other symptomatology and denies any further nausea, vomiting, or diarrhea.  It is thought that her gastrointestinal upset was also related to her hyponatremia.  She was briefly seen by oncology per family request and does have follow-up with Dr. Delton Coombes in October.  She has been seen by physical therapy with recommendations for home health PT, but declines at this time.  No other acute events noted.  Discharge Diagnoses:  Principal Problem:    Hyponatremia Active Problems:   Nausea vomiting and diarrhea   Breast cancer, left (HCC)   Essential hypertension  Principal discharge diagnosis: Acute on chronic hyponatremia likely related to SIADH in the setting of fluoxetine use.  Discharge Instructions  Discharge Instructions     Diet - low sodium heart healthy   Complete by: As directed    Increase activity slowly   Complete by: As directed       Allergies as of 02/04/2022       Reactions   Ibuprofen Other (See Comments)   "Stomach upset"   Penicillins Rash        Medication List     STOP taking these medications    FLUoxetine 20 MG capsule Commonly known as: PROZAC   lisinopril 10 MG tablet Commonly known as: ZESTRIL   oxyCODONE 5 MG immediate release tablet Commonly known as: Oxy IR/ROXICODONE       TAKE these medications    acetaminophen 325 MG tablet Commonly known as: TYLENOL Take 325 mg by mouth every 6 (six) hours as needed for moderate pain or headache.   anastrozole 1 MG tablet Commonly known as: ARIMIDEX Take 1 tablet (1 mg total) by mouth daily.   atorvastatin 20 MG tablet Commonly known as: LIPITOR Take 1 tablet (20 mg total) by mouth daily. (NEEDS TO BE SEEN BEFORE NEXT REFILL)   cholecalciferol 25 MCG (1000 UNIT) tablet Commonly known as: VITAMIN D3 Take 1,000 Units by mouth daily.   cyanocobalamin 500 MCG tablet Commonly known as: VITAMIN B12 Take 500 mcg by mouth daily.   gabapentin 300 MG capsule Commonly known as: NEURONTIN Take 1 capsule (300  mg total) by mouth 2 (two) times daily.   omeprazole 20 MG capsule Commonly known as: PRILOSEC Take 1 capsule (20 mg total) by mouth daily as needed (indigestion/heartburn).   ondansetron 4 MG tablet Commonly known as: Zofran Take 1 tablet (4 mg total) by mouth every 8 (eight) hours as needed for nausea or vomiting.   traMADol 50 MG tablet Commonly known as: Ultram Take 1 tablet (50 mg total) by mouth every 6 (six) hours as  needed for severe pain.        Follow-up Information     Loman Brooklyn, FNP. Schedule an appointment as soon as possible for a visit in 1 week(s).   Specialty: Family Medicine Contact information: [place] [address] [phone]                Allergies  Allergen Reactions   Ibuprofen Other (See Comments)    "Stomach upset"   Penicillins Rash    Consultations: Nephrology Oncology   Procedures/Studies: CT Head Wo Contrast  Result Date: 02/02/2022 CLINICAL DATA:  Dizziness and recent falls, initial encounter EXAM: CT HEAD WITHOUT CONTRAST TECHNIQUE: Contiguous axial images were obtained from the base of the skull through the vertex without intravenous contrast. RADIATION DOSE REDUCTION: This exam was performed according to the departmental dose-optimization program which includes automated exposure control, adjustment of the mA and/or kV according to patient size and/or use of iterative reconstruction technique. COMPARISON:  None Available. FINDINGS: Brain: No evidence of acute infarction, hemorrhage, hydrocephalus, extra-axial collection or mass lesion/mass effect. Vascular: No hyperdense vessel or unexpected calcification. Skull: Normal. Negative for fracture or focal lesion. Sinuses/Orbits: No acute finding. Other: None. IMPRESSION: No acute intracranial abnormality no Electronically Signed   By: Inez Catalina M.D.   On: 02/02/2022 02:13     Discharge Exam: Vitals:   02/03/22 2022 02/04/22 0444  BP: 119/76 112/72  Pulse: 61 60  Resp: 16 18  Temp: 98 F (36.7 C) 97.7 F (36.5 C)  SpO2: 100% 100%   Vitals:   02/03/22 0543 02/03/22 1209 02/03/22 2022 02/04/22 0444  BP: 136/78 112/70 119/76 112/72  Pulse: (!) 56 (!) 59 61 60  Resp:  '17 16 18  '$ Temp: 98 F (36.7 C) 97.8 F (36.6 C) 98 F (36.7 C) 97.7 F (36.5 C)  TempSrc: Oral Oral Oral   SpO2: 100% 100% 100% 100%  Weight:      Height:        General: Pt is alert, awake, not in acute  distress Cardiovascular: RRR, S1/S2 +, no rubs, no gallops Respiratory: CTA bilaterally, no wheezing, no rhonchi Abdominal: Soft, NT, ND, bowel sounds + Extremities: no edema, no cyanosis    The results of significant diagnostics from this hospitalization (including imaging, microbiology, ancillary and laboratory) are listed below for reference.     Microbiology: No results found for this or any previous visit (from the past 240 hour(s)).   Labs: BNP (last 3 results) No results for input(s): "BNP" in the last 8760 hours. Basic Metabolic Panel: Recent Labs  Lab 02/02/22 0014 02/02/22 0550 02/02/22 1744 02/03/22 0017 02/03/22 0539 02/03/22 1540 02/04/22 0448  NA 116*   < > 117* 117* 120* 117* 126*  K 4.7   < > 3.4* 3.5 4.2 4.1 4.4  CL 84*   < > 88* 89* 89* 85* 94*  CO2 23   < > 21* '22 24 25 26  '$ GLUCOSE 92   < > 72 87 82 104*  80  BUN 6*   < > 5* 5* 5* 8 7*  CREATININE 0.44   < > 0.50 0.46 0.55 0.61 0.59  CALCIUM 8.4*   < > 8.5* 8.3* 8.9 8.9 9.3  MG 1.7  --   --  1.8  --   --   --    < > = values in this interval not displayed.   Liver Function Tests: Recent Labs  Lab 02/02/22 0014  AST 32  ALT 30  ALKPHOS 38  BILITOT 0.3  PROT 6.7  ALBUMIN 4.1   Recent Labs  Lab 02/02/22 0014  LIPASE 41   No results for input(s): "AMMONIA" in the last 168 hours. CBC: Recent Labs  Lab 02/02/22 0014 02/02/22 0550  WBC 4.7 7.2  NEUTROABS 3.0  --   HGB 11.1* 10.9*  HCT 30.8* 29.9*  MCV 92.5 92.9  PLT 379 368   Cardiac Enzymes: No results for input(s): "CKTOTAL", "CKMB", "CKMBINDEX", "TROPONINI" in the last 168 hours. BNP: Invalid input(s): "POCBNP" CBG: No results for input(s): "GLUCAP" in the last 168 hours. D-Dimer No results for input(s): "DDIMER" in the last 72 hours. Hgb A1c No results for input(s): "HGBA1C" in the last 72 hours. Lipid Profile No results for input(s): "CHOL", "HDL", "LDLCALC", "TRIG", "CHOLHDL", "LDLDIRECT" in the last 72 hours. Thyroid  function studies Recent Labs    02/02/22 0738  TSH 1.168   Anemia work up No results for input(s): "VITAMINB12", "FOLATE", "FERRITIN", "TIBC", "IRON", "RETICCTPCT" in the last 72 hours. Urinalysis    Component Value Date/Time   COLORURINE STRAW (A) 02/02/2022 0216   APPEARANCEUR CLEAR 02/02/2022 0216   LABSPEC 1.004 (L) 02/02/2022 0216   PHURINE 8.0 02/02/2022 0216   GLUCOSEU NEGATIVE 02/02/2022 0216   HGBUR NEGATIVE 02/02/2022 0216   BILIRUBINUR NEGATIVE 02/02/2022 0216   KETONESUR NEGATIVE 02/02/2022 0216   PROTEINUR NEGATIVE 02/02/2022 0216   NITRITE NEGATIVE 02/02/2022 0216   LEUKOCYTESUR TRACE (A) 02/02/2022 0216   Sepsis Labs Recent Labs  Lab 02/02/22 0014 02/02/22 0550  WBC 4.7 7.2   Microbiology No results found for this or any previous visit (from the past 240 hour(s)).   Time coordinating discharge: 35 minutes  SIGNED:   Rodena Goldmann, DO [facility] 02/04/2022, 10:44 AM  If 7PM-7AM, please contact night-coverage [email or web address]

## 2022-02-04 NOTE — Care Management Important Message (Signed)
Important Message  Patient Details  Name: Samantha Mcfarland MRN: [id number] Date of Birth: March 14, 1956   Medicare Important Message Given:  N/A - LOS <3 / Initial given by admissions     Tommy Medal 02/04/2022, 11:16 AM

## 2022-02-04 NOTE — Progress Notes (Signed)
Patient discharged home with discharge instructions and follow up appointments. IV removed from RFA , taken via w/c to sisters prsonal vehicle

## 2022-02-04 NOTE — Progress Notes (Signed)
Subjective:  No major issues overnight-  sodium is climbing slowly-  up to 126 this AM but was 117 yesterday afternoon ?  Marland Kitchen  Is up at bedside making her bed-  ambulating in room independently  Objective Vital signs in last 24 hours: Vitals:   02/03/22 0543 02/03/22 1209 02/03/22 2022 02/04/22 0444  BP: 136/78 112/70 119/76 112/72  Pulse: (!) 56 (!) 59 61 60  Resp:  '17 16 18  '$ Temp: 98 F (36.7 C) 97.8 F (36.6 C) 98 F (36.7 C) 97.7 F (36.5 C)  TempSrc: Oral Oral Oral   SpO2: 100% 100% 100% 100%  Weight:      Height:       Weight change:   Intake/Output Summary (Last 24 hours) at 02/04/2022 0744 Last data filed at 02/04/2022 0739 Gross per 24 hour  Intake 960 ml  Output 1501 ml  Net -541 ml    Assessment/Plan: 66 year old WF with recurrent hyponatremia  1. Hyponatremia-  pt with baseline sodium at best in the low 130's. This is the second hospitalization for this patient in 2 weeks for hyponatremia.   The one thing I notice is that she is on fluoxetine that can cause SIADH-  said that she restarted taking in in the last couple of weeks-  agree with holding that med- would actually put that on her allergy list.  TSH is normal-  cortisol is OK-  urine osm inappropriately high c/w SIADH.  She is not overloaded and with a urine sodium of 65 dont think she is significantly dry.  She could have an element of tea and toast diet but does not compulsively drink fluids by history.  The plan right now should be to continue to hold fluoxetine-  fluid restrict and I anticipate that sodium will drift up slowly-   I think that the GI sxms she is having are caused by her sodium and not the other way around-   overall better this AM.  I would not have an issue with discharge today -  would want her to see her PCP next week to assure sodium is stable to improved more.  She does not need nephrology specific follow up    [place]: Basic Metabolic Panel: Recent Labs  Lab  02/03/22 0539 02/03/22 1540 02/04/22 0448  NA 120* 117* 126*  K 4.2 4.1 4.4  CL 89* 85* 94*  CO2 '24 25 26  '$ GLUCOSE 82 104* 80  BUN 5* 8 7*  CREATININE 0.55 0.61 0.59  CALCIUM 8.9 8.9 9.3   Liver Function Tests: Recent Labs  Lab 02/02/22 0014  AST 32  ALT 30  ALKPHOS 38  BILITOT 0.3  PROT 6.7  ALBUMIN 4.1   Recent Labs  Lab 02/02/22 0014  LIPASE 41   No results for input(s): "AMMONIA" in the last 168 hours. CBC: Recent Labs  Lab 02/02/22 0014 02/02/22 0550  WBC 4.7 7.2  NEUTROABS 3.0  --   HGB 11.1* 10.9*  HCT 30.8* 29.9*  MCV 92.5 92.9  PLT 379 368   Cardiac Enzymes: No results for input(s): "CKTOTAL", "CKMB", "CKMBINDEX", "TROPONINI" in the last 168 hours. CBG: No results for input(s): "GLUCAP" in the last 168 hours.  Iron Studies: No results for input(s): "IRON", "TIBC", "TRANSFERRIN", "FERRITIN" in the last 72 hours. Studies/Results: No results found. Medications: Infusions:   Scheduled Medications:  anastrozole  1 mg Oral Daily   enoxaparin (LOVENOX) injection  40 mg Subcutaneous  Q24H   gabapentin  300 mg Oral BID    have reviewed scheduled and prn medications.  Physical Exam: General:  thin nervous-   NAD-  ambulating in room  Heart: RRR Lungs: mostly clear Abdomen: soft, non tender Extremities: no edema    02/04/2022,7:44 AM  LOS: 2 days

## 2022-02-05 ENCOUNTER — Other Ambulatory Visit: Payer: Self-pay | Admitting: *Deleted

## 2022-02-05 ENCOUNTER — Telehealth: Payer: Self-pay | Admitting: Family Medicine

## 2022-02-05 NOTE — Patient Outreach (Signed)
  Care Coordination [facility] Note Transition Care Management Follow-up Telephone Call Date of discharge and from where: 02/04/22 [facility] How have you been since you were released from the hospital? Patient states she is feeling much better. Any questions or concerns? No  Items Reviewed: Did the pt receive and understand the discharge instructions provided? Yes  Medications obtained and verified? Yes  Other? No  Any new allergies since your discharge? No  Dietary orders reviewed? Yes Do you have support at home? Yes   Home Care and Equipment/Supplies: Were home health services ordered? no If so, what is the name of the agency? N/A  Has the agency set up a time to come to the patient's home? not applicable Were any new equipment or medical supplies ordered?  No What is the name of the medical supply agency? N/A Were you able to get the supplies/equipment? not applicable Do you have any questions related to the use of the equipment or supplies? No  Functional Questionnaire: (I = Independent and D = Dependent) ADLs: I  Bathing/Dressing- I  Meal Prep- I  Eating- I  Maintaining continence- I  Transferring/Ambulation- I  Managing Meds- I  Follow up appointments reviewed:  PCP Hospital f/u appt confirmed? No , nurse will inform [facility] f/u appt confirmed? No   Are transportation arrangements needed? No  If their condition worsens, is the pt aware to call PCP or go to the Emergency Dept.? Yes Was the patient provided with contact information for the PCP's office or ED? Yes Was to pt encouraged to call back with questions or concerns? Yes  SDOH assessments and interventions completed:   Yes  Care Coordination Interventions Activated:  No   Care Coordination Interventions:  PCP follow up appointment requested    Encounter Outcome:  Pt. Visit Completed    Emelia Loron RN, BSN [place] [phone] [email or web address]

## 2022-02-05 NOTE — Telephone Encounter (Signed)
Appointment given for August 22nd at 2:05 with Monia Pouch, [place].

## 2022-02-06 ENCOUNTER — Other Ambulatory Visit (HOSPITAL_COMMUNITY): Payer: Self-pay | Admitting: Hematology

## 2022-02-08 ENCOUNTER — Other Ambulatory Visit: Payer: Self-pay | Admitting: *Deleted

## 2022-02-08 ENCOUNTER — Telehealth: Payer: Self-pay

## 2022-02-08 NOTE — Chronic Care Management (AMB) (Signed)
  Care Coordination  Note  02/08/2022 Name: Samantha Mcfarland MRN: [id number] DOB: 1955/09/07  Samantha Mcfarland is a 66 y.o. year old female who is a primary care patient of Loman Brooklyn, FNP. I reached out to [name] by phone today to offer care coordination services.      Ms. Partch was given information about Care Coordination services today including:  The Care Coordination services include support from the care team which includes your Nurse Coordinator, Clinical Social Worker, or Pharmacist.  The Care Coordination team is here to help remove barriers to the health concerns and goals most important to you. Care Coordination services are voluntary and the patient may decline or stop services at any time by request to their care team member.   Patient agreed to services and verbal consent obtained.   Follow up plan: Telephone appointment with care coordination team member scheduled for:02/26/2022  Noreene Larsson, [place], [place] [address] Direct Dial: [phone] [email or web address]

## 2022-02-08 NOTE — Telephone Encounter (Signed)
Anastrozole refill approved.  Is tolerating and is to continue therapy.

## 2022-02-09 ENCOUNTER — Encounter: Payer: Self-pay | Admitting: Family Medicine

## 2022-02-09 ENCOUNTER — Ambulatory Visit (INDEPENDENT_AMBULATORY_CARE_PROVIDER_SITE_OTHER): Payer: Medicare Other | Admitting: Family Medicine

## 2022-02-09 VITALS — BP 112/66 | HR 71 | Temp 98.7°F | Ht 66.0 in | Wt 115.8 lb

## 2022-02-09 DIAGNOSIS — E538 Deficiency of other specified B group vitamins: Secondary | ICD-10-CM

## 2022-02-09 DIAGNOSIS — R5383 Other fatigue: Secondary | ICD-10-CM

## 2022-02-09 DIAGNOSIS — R6889 Other general symptoms and signs: Secondary | ICD-10-CM | POA: Diagnosis not present

## 2022-02-09 DIAGNOSIS — M6740 Ganglion, unspecified site: Secondary | ICD-10-CM

## 2022-02-09 DIAGNOSIS — R5381 Other malaise: Secondary | ICD-10-CM

## 2022-02-09 DIAGNOSIS — E871 Hypo-osmolality and hyponatremia: Secondary | ICD-10-CM | POA: Diagnosis not present

## 2022-02-09 DIAGNOSIS — Z09 Encounter for follow-up examination after completed treatment for conditions other than malignant neoplasm: Secondary | ICD-10-CM

## 2022-02-09 DIAGNOSIS — Z8639 Personal history of other endocrine, nutritional and metabolic disease: Secondary | ICD-10-CM

## 2022-02-09 MED ORDER — PREDNISONE 10 MG (21) PO TBPK: ORAL_TABLET | ORAL | 0 refills | Status: DC

## 2022-02-09 NOTE — Progress Notes (Signed)
Subjective:  Patient ID: Samantha Mcfarland, female    DOB: 08-Jan-1956, 66 y.o.   MRN: [id number]  Patient Care Team: Baruch Gouty, FNP as PCP - General (Family Medicine) Derek Jack, MD as Medical Oncologist (Medical Oncology) Brien Mates, RN as Oncology Nurse Navigator (Oncology) Barbaraann Faster, RN as [place] Management   Chief Complaint:  Hospitalization Follow-up (Low sodium)   HPI: Samantha Mcfarland is a 66 y.o. female presenting on 02/09/2022 for Hospitalization Follow-up (Low sodium)   Today's visit was for Transitional Care Management. The patient was discharged from [facility] on 02/04/2022 with a primary diagnosis of hyponatremia. Contact with the patient and/or caregiver, by a clinical staff member, was made on 02/05/2022 and was documented as a telephone encounter within the EMR. Through chart review and discussion with the patient I have determined that management of their condition is of high complexity.  This was pts second admission to hospital for hyponatremia. First admission was felt to be due to n/v/d. This visit she did have some nausea but no significant diarrhea or vomiting. She was on SSRI therapy and hyponatremia was attributed to this. Nephrology was consulted and SIADH workup was unremarkable. SSRI therapy was discontinued. Pt denies withdrawal symptoms. States she still has some fatigue and malaise since discharge. Denies dizziness, confusion, weakness, n/v/d. She also complains of "bursitis" in her right wrist. States this flares at times. No new injury. Has not had fluid drained from site in the past.    Relevant past medical, surgical, family, and social history reviewed and updated as indicated.  Allergies and medications reviewed and updated. Data reviewed: Chart in Epic.   Past Medical History:  Diagnosis Date   Allergy    Anxiety    Breast cancer ([place])    Depression    Family history of breast cancer    Family history  of colon cancer    Family history of pancreatic cancer    Family history of thyroid cancer    GERD (gastroesophageal reflux disease)    Hyperlipidemia    Hypertension    Osteopenia 04/30/2021   Port-A-Cath in place 05/07/2021   Vitamin D insufficiency     Past Surgical History:  Procedure Laterality Date   EXCISION OF SKIN TAG Right 05/27/2021   Procedure: EXCISION OF RIGHT SKIN TAG;  Surgeon: Virl Cagey, MD;  Location: [facility];  Service: General;  Laterality: Right;   MASTECTOMY W/ SENTINEL NODE BIOPSY Left 03/23/2021   Procedure: TOTAL MASTECTOMY WITH SENTINEL LYMPH NODE BIOPSY;  Surgeon: Virl Cagey, MD;  Location: [facility];  Service: General;  Laterality: Left;   NECK SURGERY     OOPHORECTOMY     PORT-A-CATH REMOVAL Right 09/23/2021   Procedure: MINOR REMOVAL PORT-A-CATH;  Surgeon: Virl Cagey, MD;  Location: [facility];  Service: General;  Laterality: Right;   PORTACATH PLACEMENT Right 05/27/2021   Procedure: INSERTION PORT-A-CATH;  Surgeon: Virl Cagey, MD;  Location: [facility];  Service: General;  Laterality: Right;   SALPINGECTOMY      Social History   Socioeconomic History   Marital status: Divorced    Spouse name: Not on file   Number of children: 0   Years of education: Not on file   Highest education level: Not on file  Occupational History   Occupation: retired  Tobacco Use   Smoking status: Former    Packs/day: 0.25    Types: Cigarettes   Smokeless tobacco: Never  Tobacco comments:    1 pack every 3-4 days  Vaping Use   Vaping Use: Never used  Substance and Sexual Activity   Alcohol use: Yes    Alcohol/week: 20.0 standard drinks of alcohol    Types: 20 Cans of beer per week    Comment: 3-4 beers per day   Drug use: Never   Sexual activity: Not Currently    Birth control/protection: Post-menopausal  Other Topics Concern   Not on file  Social History Narrative   Sister lives about 15 minutes away   Social Determinants of Health    Financial Resource Strain: Low Risk  (05/21/2021)   Overall Financial Resource Strain (CARDIA)    Difficulty of Paying Living Expenses: Not hard at all  Food Insecurity: No Food Insecurity (05/21/2021)   Hunger Vital Sign    Worried About Running Out of Food in the Last Year: Never true    [place] in the Last Year: Never true  Transportation Needs: No Transportation Needs (05/21/2021)   PRAPARE - Hydrologist (Medical): No    Lack of Transportation (Non-Medical): No  Physical Activity: Insufficiently Active (05/21/2021)   Exercise Vital Sign    Days of Exercise per Week: 5 days    Minutes of Exercise per Session: 20 min  Stress: Stress Concern Present (05/21/2021)   [place]    Feeling of Stress : To some extent  Social Connections: Socially Isolated (05/21/2021)   Social Connection and Isolation Panel [NHANES]    Frequency of Communication with Friends and Family: More than three times a week    Frequency of Social Gatherings with Friends and Family: More than three times a week    Attends Religious Services: Never    Marine scientist or Organizations: No    Attends Archivist Meetings: Never    Marital Status: Divorced  Human resources officer Violence: Not At Risk (05/21/2021)   Humiliation, Afraid, Rape, and Kick questionnaire    Fear of Current or Ex-Partner: No    Emotionally Abused: No    Physically Abused: No    Sexually Abused: No    Outpatient Encounter Medications as of 02/09/2022  Medication Sig   acetaminophen (TYLENOL) 325 MG tablet Take 325 mg by mouth every 6 (six) hours as needed for moderate pain or headache.   anastrozole (ARIMIDEX) 1 MG tablet Take 1 tablet (1 mg total) by mouth daily.   atorvastatin (LIPITOR) 20 MG tablet Take 1 tablet (20 mg total) by mouth daily. (NEEDS TO BE SEEN BEFORE NEXT REFILL)   cholecalciferol (VITAMIN D3) 25 MCG (1000  UNIT) tablet Take 1,000 Units by mouth daily.   gabapentin (NEURONTIN) 300 MG capsule TAKE 1 CAPSULE 2 TIMES A DAY   omeprazole (PRILOSEC) 20 MG capsule Take 1 capsule (20 mg total) by mouth daily as needed (indigestion/heartburn).   ondansetron (ZOFRAN) 4 MG tablet Take 1 tablet (4 mg total) by mouth every 8 (eight) hours as needed for nausea or vomiting.   predniSONE (STERAPRED UNI-PAK 21 TAB) 10 MG (21) TBPK tablet As directed x 6 days   traMADol (ULTRAM) 50 MG tablet Take 1 tablet (50 mg total) by mouth every 6 (six) hours as needed for severe pain.   vitamin B-12 (CYANOCOBALAMIN) 500 MCG tablet Take 500 mcg by mouth daily.   No facility-administered encounter medications on file as of 02/09/2022.    Allergies  Allergen Reactions  Ibuprofen Other (See Comments)    "Stomach upset"   Penicillins Rash    Review of Systems  Constitutional:  Positive for activity change, appetite change and fatigue. Negative for chills and fever.  HENT: Negative.    Eyes: Negative.   Respiratory:  Negative for cough, chest tightness and shortness of breath.   Cardiovascular:  Negative for chest pain, palpitations and leg swelling.  Gastrointestinal:  Negative for abdominal pain, blood in stool, constipation, diarrhea, nausea and vomiting.  Endocrine: Negative.   Genitourinary:  Negative for decreased urine volume, difficulty urinating, dysuria, frequency and urgency.  Musculoskeletal:  Positive for arthralgias, gait problem and joint swelling. Negative for myalgias.  Skin: Negative.   Allergic/Immunologic: Negative.   Neurological:  Negative for dizziness, tremors, seizures, syncope, facial asymmetry, speech difficulty, weakness, light-headedness, numbness and headaches.  Hematological: Negative.   Psychiatric/Behavioral:  Negative for confusion, hallucinations, sleep disturbance and suicidal ideas.   All other systems reviewed and are negative.       Objective:  BP 112/66   Pulse 71   Temp  98.7 F (37.1 C)   Ht _0  (1.676 m)   Wt 115 lb 12.8 oz (52.5 kg)   SpO2 93%   BMI 18.69 kg/m    Wt Readings from Last 3 Encounters:  02/09/22 115 lb 12.8 oz (52.5 kg)  02/01/22 112 lb 3.4 oz (50.9 kg)  01/23/22 112 lb 3.4 oz (50.9 kg)    Physical Exam Vitals and nursing note reviewed.  Constitutional:      General: She is not in acute distress.    Appearance: Normal appearance. She is well-developed and well-groomed. She is not ill-appearing, toxic-appearing or diaphoretic.  HENT:     Head: Normocephalic and atraumatic.     Jaw: There is normal jaw occlusion.     Right Ear: Hearing normal.     Left Ear: Hearing normal.     Nose: Nose normal.     Mouth/Throat:     Lips: Pink.     Mouth: Mucous membranes are moist.     Pharynx: Oropharynx is clear. Uvula midline.  Eyes:     General: Lids are normal.     Extraocular Movements: Extraocular movements intact.     Conjunctiva/sclera: Conjunctivae normal.     Pupils: Pupils are equal, round, and reactive to light.  Neck:     Thyroid: No thyroid mass, thyromegaly or thyroid tenderness.     Vascular: No carotid bruit or JVD.     Trachea: Trachea and phonation normal.  Cardiovascular:     Rate and Rhythm: Normal rate and regular rhythm.     Chest Wall: PMI is not displaced.     Pulses: Normal pulses.     Heart sounds: Normal heart sounds. No murmur heard.    No friction rub. No gallop.  Pulmonary:     Effort: Pulmonary effort is normal. No respiratory distress.     Breath sounds: Normal breath sounds. No wheezing.  Abdominal:     General: Bowel sounds are normal. There is no distension or abdominal bruit.     Palpations: Abdomen is soft. There is no hepatomegaly or splenomegaly.     Tenderness: There is no abdominal tenderness. There is no right CVA tenderness or left CVA tenderness.     Hernia: No hernia is present.  Musculoskeletal:        General: Normal range of motion.       Arms:     Cervical back: Normal range  of motion and neck supple.     Right lower leg: No edema.     Left lower leg: No edema.  Lymphadenopathy:     Cervical: No cervical adenopathy.  Skin:    General: Skin is warm and dry.     Capillary Refill: Capillary refill takes less than 2 seconds.     Coloration: Skin is not cyanotic, jaundiced or pale.     Findings: No rash.  Neurological:     General: No focal deficit present.     Mental Status: She is alert and oriented to person, place, and time.     Sensory: Sensation is intact.     Motor: Motor function is intact.     Coordination: Coordination is intact.     Gait: Gait abnormal (using cane).     Deep Tendon Reflexes: Reflexes are normal and symmetric.  Psychiatric:        Attention and Perception: Attention and perception normal.        Mood and Affect: Mood and affect normal.        Speech: Speech normal.        Behavior: Behavior normal. Behavior is cooperative.        Thought Content: Thought content normal.        Cognition and Memory: Cognition and memory normal.        Judgment: Judgment normal.     Results for orders placed or performed during the hospital encounter of 02/01/22  CBC with Differential  Result Value Ref Range   WBC 4.7 4.0 - 10.5 K/uL   RBC 3.33 (L) 3.87 - 5.11 MIL/uL   Hemoglobin 11.1 (L) 12.0 - 15.0 g/dL   HCT 30.8 (L) 36.0 - 46.0 %   MCV 92.5 80.0 - 100.0 fL   MCH 33.3 26.0 - 34.0 pg   MCHC 36.0 30.0 - 36.0 g/dL   RDW 13.6 11.5 - 15.5 %   Platelets 379 150 - 400 K/uL   nRBC 0.0 0.0 - 0.2 %   Neutrophils Relative % 65 %   Neutro Abs 3.0 1.7 - 7.7 K/uL   Lymphocytes Relative 21 %   Lymphs Abs 1.0 0.7 - 4.0 K/uL   Monocytes Relative 10 %   Monocytes Absolute 0.5 0.1 - 1.0 K/uL   Eosinophils Relative 3 %   Eosinophils Absolute 0.2 0.0 - 0.5 K/uL   Basophils Relative 0 %   Basophils Absolute 0.0 0.0 - 0.1 K/uL   Immature Granulocytes 1 %   Abs Immature Granulocytes 0.05 0.00 - 0.07 K/uL  Comprehensive metabolic panel  Result Value  Ref Range   Sodium 116 (LL) 135 - 145 mmol/L   Potassium 4.7 3.5 - 5.1 mmol/L   Chloride 84 (L) 98 - 111 mmol/L   CO2 23 22 - 32 mmol/L   Glucose, Bld 92 70 - 99 mg/dL   BUN 6 (L) 8 - 23 mg/dL   Creatinine, Ser 0.44 0.44 - 1.00 mg/dL   Calcium 8.4 (L) 8.9 - 10.3 mg/dL   Total Protein 6.7 6.5 - 8.1 g/dL   Albumin 4.1 3.5 - 5.0 g/dL   AST 32 15 - 41 U/L   ALT 30 0 - 44 U/L   Alkaline Phosphatase 38 38 - 126 U/L   Total Bilirubin 0.3 0.3 - 1.2 mg/dL   GFR, Estimated >60 >60 mL/min   Anion gap 9 5 - 15  Lipase, blood  Result Value Ref Range   Lipase 41 11 - 51 U/L  Urinalysis, Routine w reflex microscopic Urine, Clean Catch  Result Value Ref Range   Color, Urine STRAW (A) YELLOW   APPearance CLEAR CLEAR   Specific Gravity, Urine 1.004 (L) 1.005 - 1.030   pH 8.0 5.0 - 8.0   Glucose, UA NEGATIVE NEGATIVE mg/dL   Hgb urine dipstick NEGATIVE NEGATIVE   Bilirubin Urine NEGATIVE NEGATIVE   Ketones, ur NEGATIVE NEGATIVE mg/dL   Protein, ur NEGATIVE NEGATIVE mg/dL   Nitrite NEGATIVE NEGATIVE   Leukocytes,Ua TRACE (A) NEGATIVE   RBC / HPF 0-5 0 - 5 RBC/hpf   WBC, UA 0-5 0 - 5 WBC/hpf   Bacteria, UA NONE SEEN NONE SEEN   Squamous Epithelial / LPF 0-5 0 - 5   Mucus PRESENT   Osmolality  Result Value Ref Range   Osmolality 250 (L) 275 - 295 mOsm/kg  Sodium, urine, random  Result Value Ref Range   Sodium, Ur 65 mmol/L  Creatinine, urine, random  Result Value Ref Range   Creatinine, Urine 11.59 mg/dL  Basic metabolic panel  Result Value Ref Range   Sodium 118 (LL) 135 - 145 mmol/L   Potassium 4.5 3.5 - 5.1 mmol/L   Chloride 89 (L) 98 - 111 mmol/L   CO2 23 22 - 32 mmol/L   Glucose, Bld 86 70 - 99 mg/dL   BUN 6 (L) 8 - 23 mg/dL   Creatinine, Ser 0.45 0.44 - 1.00 mg/dL   Calcium 8.4 (L) 8.9 - 10.3 mg/dL   GFR, Estimated >60 >60 mL/min   Anion gap 6 5 - 15  Basic metabolic panel  Result Value Ref Range   Sodium 116 (LL) 135 - 145 mmol/L   Potassium 4.3 3.5 - 5.1 mmol/L    Chloride 87 (L) 98 - 111 mmol/L   CO2 23 22 - 32 mmol/L   Glucose, Bld 87 70 - 99 mg/dL   BUN 5 (L) 8 - 23 mg/dL   Creatinine, Ser 0.53 0.44 - 1.00 mg/dL   Calcium 8.6 (L) 8.9 - 10.3 mg/dL   GFR, Estimated >60 >60 mL/min   Anion gap 6 5 - 15  Basic metabolic panel  Result Value Ref Range   Sodium 117 (LL) 135 - 145 mmol/L   Potassium 3.4 (L) 3.5 - 5.1 mmol/L   Chloride 88 (L) 98 - 111 mmol/L   CO2 21 (L) 22 - 32 mmol/L   Glucose, Bld 72 70 - 99 mg/dL   BUN 5 (L) 8 - 23 mg/dL   Creatinine, Ser 0.50 0.44 - 1.00 mg/dL   Calcium 8.5 (L) 8.9 - 10.3 mg/dL   GFR, Estimated >60 >60 mL/min   Anion gap 8 5 - 15  Magnesium  Result Value Ref Range   Magnesium 1.7 1.7 - 2.4 mg/dL  CBC  Result Value Ref Range   WBC 7.2 4.0 - 10.5 K/uL   RBC 3.22 (L) 3.87 - 5.11 MIL/uL   Hemoglobin 10.9 (L) 12.0 - 15.0 g/dL   HCT 29.9 (L) 36.0 - 46.0 %   MCV 92.9 80.0 - 100.0 fL   MCH 33.9 26.0 - 34.0 pg   MCHC 36.5 (H) 30.0 - 36.0 g/dL   RDW 13.5 11.5 - 15.5 %   Platelets 368 150 - 400 K/uL   nRBC 0.0 0.0 - 0.2 %  Osmolality, urine  Result Value Ref Range   Osmolality, Ur 199 (L) 300 - 900 mOsm/kg  TSH  Result Value Ref Range   TSH 1.168 0.350 - 4.500 uIU/mL  Osmolality, urine  Result Value Ref Range   Osmolality, Ur 376 300 - 900 mOsm/kg  Cortisol-am, blood  Result Value Ref Range   Cortisol - AM 16.4 6.7 - 22.6 ug/dL  Basic metabolic panel  Result Value Ref Range   Sodium 117 (LL) 135 - 145 mmol/L   Potassium 3.5 3.5 - 5.1 mmol/L   Chloride 89 (L) 98 - 111 mmol/L   CO2 22 22 - 32 mmol/L   Glucose, Bld 87 70 - 99 mg/dL   BUN 5 (L) 8 - 23 mg/dL   Creatinine, Ser 0.46 0.44 - 1.00 mg/dL   Calcium 8.3 (L) 8.9 - 10.3 mg/dL   GFR, Estimated >60 >60 mL/min   Anion gap 6 5 - 15  Magnesium  Result Value Ref Range   Magnesium 1.8 1.7 - 2.4 mg/dL  Basic metabolic panel  Result Value Ref Range   Sodium 120 (L) 135 - 145 mmol/L   Potassium 4.2 3.5 - 5.1 mmol/L   Chloride 89 (L) 98 - 111 mmol/L    CO2 24 22 - 32 mmol/L   Glucose, Bld 82 70 - 99 mg/dL   BUN 5 (L) 8 - 23 mg/dL   Creatinine, Ser 0.55 0.44 - 1.00 mg/dL   Calcium 8.9 8.9 - 10.3 mg/dL   GFR, Estimated >60 >60 mL/min   Anion gap 7 5 - 15  Basic metabolic panel  Result Value Ref Range   Sodium 117 (LL) 135 - 145 mmol/L   Potassium 4.1 3.5 - 5.1 mmol/L   Chloride 85 (L) 98 - 111 mmol/L   CO2 25 22 - 32 mmol/L   Glucose, Bld 104 (H) 70 - 99 mg/dL   BUN 8 8 - 23 mg/dL   Creatinine, Ser 0.61 0.44 - 1.00 mg/dL   Calcium 8.9 8.9 - 10.3 mg/dL   GFR, Estimated >60 >60 mL/min   Anion gap 7 5 - 15  Basic metabolic panel  Result Value Ref Range   Sodium 126 (L) 135 - 145 mmol/L   Potassium 4.4 3.5 - 5.1 mmol/L   Chloride 94 (L) 98 - 111 mmol/L   CO2 26 22 - 32 mmol/L   Glucose, Bld 80 70 - 99 mg/dL   BUN 7 (L) 8 - 23 mg/dL   Creatinine, Ser 0.59 0.44 - 1.00 mg/dL   Calcium 9.3 8.9 - 10.3 mg/dL   GFR, Estimated >60 >60 mL/min   Anion gap 6 5 - 15       Pertinent labs & imaging results that were available during my care of the patient were reviewed by me and considered in my medical decision making.  Assessment & Plan:  Samantha Mcfarland was seen today for hospitalization follow-up.  Diagnoses and all orders for this visit:  Hospital discharge follow-up Today's visit was for Transitional Care Management. The patient was discharged from [facility] on 02/04/2022 with a primary diagnosis of hyponatremia.   Contact with the patient and/or caregiver, by a clinical staff member, was made on 02/05/2022 and was documented as a telephone encounter within the EMR.  Through chart review and discussion with the patient I have determined that management of their condition is of high complexity.  -     CBC with Differential/Platelet -     CMP14+EGFR -     Thyroid Panel With TSH -     Vitamin B12 -     VITAMIN D 25 Hydroxy (Vit-D Deficiency, Fractures)  Hyponatremia -     CMP14+EGFR  Malaise and fatigue Will check below labs for  potential underlying causes. Further treatment pending results.  -     CBC with Differential/Platelet -     CMP14+EGFR -     Thyroid Panel With TSH -     Vitamin B12 -     VITAMIN D 25 Hydroxy (Vit-D Deficiency, Fractures)  History of vitamin D deficiency -     VITAMIN D 25 Hydroxy (Vit-D Deficiency, Fractures)  Ganglion cyst Pt declined having cyst drained today. Will burst with steroids to see if beneficial. Report new, worsening, or persistent symptoms.  -     predniSONE (STERAPRED UNI-PAK 21 TAB) 10 MG (21) TBPK tablet; As directed x 6 days  Vitamin B12 deficiency -     Vitamin B12     Continue all other maintenance medications.  Follow up plan: Return if symptoms worsen or fail to improve.   Continue healthy lifestyle choices, including diet (rich in fruits, vegetables, and lean proteins, and low in salt and simple carbohydrates) and exercise (at least 30 minutes of moderate physical activity daily).   The above assessment and management plan was discussed with the patient. The patient verbalized understanding of and has agreed to the management plan. Patient is aware to call the clinic if they develop any new symptoms or if symptoms persist or worsen. Patient is aware when to return to the clinic for a follow-up visit. Patient educated on when it is appropriate to go to the emergency department.   Monia Pouch, FNP-C [facility] [phone]

## 2022-02-10 LAB — CBC WITH DIFFERENTIAL/PLATELET
Basophils Absolute: 0.1 10*3/uL (ref 0.0–0.2)
Basos: 1 %
EOS (ABSOLUTE): 0.4 10*3/uL (ref 0.0–0.4)
Eos: 7 %
Hematocrit: 29.8 % — ABNORMAL LOW (ref 34.0–46.6)
Hemoglobin: 10.3 g/dL — ABNORMAL LOW (ref 11.1–15.9)
Immature Grans (Abs): 0 10*3/uL (ref 0.0–0.1)
Immature Granulocytes: 0 %
Lymphocytes Absolute: 2 10*3/uL (ref 0.7–3.1)
Lymphs: 34 %
MCH: 34.6 pg — ABNORMAL HIGH (ref 26.6–33.0)
MCHC: 34.6 g/dL (ref 31.5–35.7)
MCV: 100 fL — ABNORMAL HIGH (ref 79–97)
Monocytes Absolute: 0.5 10*3/uL (ref 0.1–0.9)
Monocytes: 8 %
Neutrophils Absolute: 2.9 10*3/uL (ref 1.4–7.0)
Neutrophils: 50 %
Platelets: 358 10*3/uL (ref 150–450)
RBC: 2.98 x10E6/uL — ABNORMAL LOW (ref 3.77–5.28)
RDW: 14.9 % (ref 11.7–15.4)
WBC: 5.8 10*3/uL (ref 3.4–10.8)

## 2022-02-10 LAB — CMP14+EGFR
ALT: 15 IU/L (ref 0–32)
AST: 32 IU/L (ref 0–40)
Albumin/Globulin Ratio: 2.1 (ref 1.2–2.2)
Albumin: 4.5 g/dL (ref 3.9–4.9)
Alkaline Phosphatase: 48 IU/L (ref 44–121)
BUN/Creatinine Ratio: 8 — ABNORMAL LOW (ref 12–28)
BUN: 5 mg/dL — ABNORMAL LOW (ref 8–27)
Bilirubin Total: 0.4 mg/dL (ref 0.0–1.2)
CO2: 24 mmol/L (ref 20–29)
Calcium: 9.8 mg/dL (ref 8.7–10.3)
Chloride: 96 mmol/L (ref 96–106)
Creatinine, Ser: 0.65 mg/dL (ref 0.57–1.00)
Globulin, Total: 2.1 g/dL (ref 1.5–4.5)
Glucose: 76 mg/dL (ref 70–99)
Potassium: 4.8 mmol/L (ref 3.5–5.2)
Sodium: 134 mmol/L (ref 134–144)
Total Protein: 6.6 g/dL (ref 6.0–8.5)
eGFR: 98 mL/min/{1.73_m2} (ref >59)

## 2022-02-10 LAB — VITAMIN D 25 HYDROXY (VIT D DEFICIENCY, FRACTURES): Vit D, 25-Hydroxy: 40.1 ng/mL (ref 30.0–100.0)

## 2022-02-10 LAB — THYROID PANEL WITH TSH
Free Thyroxine Index: 0.9 — ABNORMAL LOW (ref 1.2–4.9)
T3 Uptake Ratio: 21 % — ABNORMAL LOW (ref 24–39)
T4, Total: 4.5 ug/dL (ref 4.5–12.0)
TSH: 1.48 u[IU]/mL (ref 0.450–4.500)

## 2022-02-10 LAB — VITAMIN B12: Vitamin B-12: >2000 pg/mL — ABNORMAL HIGH (ref 232–1245)

## 2022-02-15 ENCOUNTER — Other Ambulatory Visit: Payer: Self-pay | Admitting: Family Medicine

## 2022-02-15 DIAGNOSIS — M6740 Ganglion, unspecified site: Secondary | ICD-10-CM

## 2022-02-24 ENCOUNTER — Ambulatory Visit (HOSPITAL_COMMUNITY): Payer: Medicare Other

## 2022-02-25 ENCOUNTER — Ambulatory Visit (HOSPITAL_COMMUNITY)
Admission: RE | Admit: 2022-02-25 | Discharge: 2022-02-25 | Disposition: A | Discharge status: Home / Self Care | Payer: Medicare Other | Source: Ambulatory Visit | Attending: Hematology | Admitting: Hematology

## 2022-02-25 DIAGNOSIS — Z170 Estrogen receptor positive status [ER+]: Secondary | ICD-10-CM | POA: Diagnosis not present

## 2022-02-25 DIAGNOSIS — Z1231 Encounter for screening mammogram for malignant neoplasm of breast: Secondary | ICD-10-CM | POA: Diagnosis not present

## 2022-02-25 DIAGNOSIS — C50912 Malignant neoplasm of unspecified site of left female breast: Secondary | ICD-10-CM | POA: Diagnosis not present

## 2022-02-26 ENCOUNTER — Ambulatory Visit: Payer: Self-pay | Admitting: *Deleted

## 2022-02-26 ENCOUNTER — Encounter: Payer: Self-pay | Admitting: *Deleted

## 2022-02-26 NOTE — Patient Outreach (Signed)
  Care Coordination   02/26/2022 Name: Samantha Mcfarland MRN: [id number] DOB: Sep 22, 1955   Care Coordination Outreach Attempts:  An unsuccessful telephone outreach was attempted today to offer the patient information about available care coordination services as a benefit of their health plan.   Follow Up Plan:  Additional outreach attempts will be made to offer the patient care coordination information and services.   Encounter Outcome:  No Answer  Care Coordination Interventions Activated:  No   Care Coordination Interventions:  No, not indicated    SIG Kedarius Aloisi L. Lavina Hamman, RN, BSN, [place] Coordinator Office number [phone]

## 2022-02-26 NOTE — Patient Instructions (Addendum)
Visit Information  Thank you for taking time to visit with me today. Please don't hesitate to contact me if I can be of assistance to you.   Following are the goals we discussed today:   Goals Addressed               This Visit's Progress     Patient Stated     manage hearing loss [facility]) (pt-stated)   Not on track     Care Coordination Interventions: Evaluation of current treatment plan related to hearing loss possible related to post chemotherapy per patient and patient's adherence to plan as established by provider Advised patient to discuss the with new FNP Collaborated with New FNP regarding a possible audiology screening for patient via EPIC in basket      Manage hypertension ([facility]) (pt-stated)   Not on track     Care Coordination Interventions: Evaluation of current treatment plan related to hypertension self management and patient's adherence to plan as established by provider Provided assistance with obtaining home blood pressure monitor via [place] [facility] over the counter benefit/U card; Discussed plans with patient for ongoing care management follow up and provided patient with direct contact information for care management team Advised patient, providing education and rationale, to monitor blood pressure daily and record, calling PCP for findings outside established parameters Screening for signs and symptoms of depression related to chronic disease state  Assessed social determinant of health barriers      Manage hyponatremia ([facility]) (pt-stated)   On track     Care Coordination Interventions: Evaluation of current treatment plan related to hyponatremia and patient's adherence to plan as established by provider Provided education to patient re: pathophysiology of hyponatremia, dehydration, importance of fluid replacement after vomiting Discussed plans with patient for ongoing care management follow up and provided patient with direct contact information for care management  team Screening for signs and symptoms of depression related to chronic disease state  Assessed social determinant of health barriers        Our next appointment is by telephone on 03/26/22 at 1000  Please call the care guide team at [phone] if you need to cancel or reschedule your appointment.   If you are experiencing a Mental Health or [place] or need someone to talk to, please call the Suicide and Crisis Lifeline: 988 call the [place] National Suicide Prevention Lifeline: 612-181-0693 or TTY: 740-445-7314 TTY 551-783-8004) to talk to a trained counselor call 1-800-273-TALK (toll free, 24 hour hotline) call the [facility]: 4341189925 call 911   The patient verbalized understanding of instructions, educational materials, and care plan provided today and DECLINED offer to receive copy of patient instructions, educational materials, and care plan.   The patient has been provided with contact information for the care management team and has been advised to call with any health related questions or concerns.   Beadle Lavina Hamman, RN, BSN, [place] Coordinator Office number [phone]

## 2022-02-26 NOTE — Patient Outreach (Signed)
  Care Coordination   Initial Visit Note   02/26/2022 Name: Samantha Mcfarland MRN: [id number] DOB: 08-31-1955  Samantha Mcfarland is a 66 y.o. year old female who sees Rakes, Connye Burkitt, FNP for primary care. I spoke with  Samantha Mcfarland by phone today.  What matters to the patients health and wellness today?  Manage hypertension - Prevent any further nausea and vomiting (N/V) leading to hyponatremia N/V related to 2 discontinued medicines per patient She is not checking BP at home  She gave permission for a conference call with her today to [facility] customer service to attempt to get a blood pressure cuff mailed via her over the counter benefit  Asked questions about Chemo and hearing loss PMH left breast cancer with chemotherapy completed She reports difficulty hearing at home when listening to her TV She reports no matter how she increases the volume of the TV she still has trouble hearing and notes also some muffling sounds    Goals Addressed               This Visit's Progress     Patient Stated     manage hearing loss [facility]) (pt-stated)   Not on track     Care Coordination Interventions: Evaluation of current treatment plan related to hearing loss possible related to post chemotherapy per patient and patient's adherence to plan as established by provider Advised patient to discuss the with new FNP Collaborated with New FNP regarding a possible audiology screening for patient via EPIC in basket      Manage hypertension ([facility]) (pt-stated)   Not on track     Care Coordination Interventions: Evaluation of current treatment plan related to hypertension self management and patient's adherence to plan as established by provider Provided assistance with obtaining home blood pressure monitor via [place] [facility] over the counter benefit/U card; Discussed plans with patient for ongoing care management follow up and provided patient with direct contact information for care management team Advised  patient, providing education and rationale, to monitor blood pressure daily and record, calling PCP for findings outside established parameters Screening for signs and symptoms of depression related to chronic disease state  Assessed social determinant of health barriers      Manage hyponatremia ([facility]) (pt-stated)   On track     Care Coordination Interventions: Evaluation of current treatment plan related to hyponatremia and patient's adherence to plan as established by provider Provided education to patient re: pathophysiology of hyponatremia, dehydration, importance of fluid replacement after vomiting Discussed plans with patient for ongoing care management follow up and provided patient with direct contact information for care management team Screening for signs and symptoms of depression related to chronic disease state  Assessed social determinant of health barriers        SDOH assessments and interventions completed:  Yes  SDOH Interventions Today    Flowsheet Row Most Recent Value  SDOH Interventions   Food Insecurity Interventions Intervention Not Indicated  Transportation Interventions Intervention Not Indicated  Financial Strain Interventions Intervention Not Indicated        Care Coordination Interventions Activated:  Yes  Care Coordination Interventions:  Yes, provided   Follow up plan: Follow up call scheduled for 03/26/22 1000    Encounter Outcome:  Pt. Visit Completed   Samantha Mcfarland L. Lavina Hamman, RN, BSN, [place] Coordinator Office number [phone]

## 2022-02-28 ENCOUNTER — Other Ambulatory Visit: Payer: Self-pay | Admitting: Family Medicine

## 2022-02-28 DIAGNOSIS — H9103 Ototoxic hearing loss, bilateral: Secondary | ICD-10-CM

## 2022-02-28 NOTE — Progress Notes (Signed)
Hearing loss after chemotherapy, referral placed.

## 2022-03-02 ENCOUNTER — Observation Stay (HOSPITAL_COMMUNITY): Payer: Medicare Other

## 2022-03-02 ENCOUNTER — Other Ambulatory Visit: Payer: Self-pay

## 2022-03-02 ENCOUNTER — Inpatient Hospital Stay (HOSPITAL_COMMUNITY)
Admission: EM | Admit: 2022-03-02 | Discharge: 2022-03-05 | DRG: 178 | Disposition: A | Discharge status: Home / Self Care | Payer: Medicare Other | Attending: Family Medicine | Admitting: Family Medicine

## 2022-03-02 ENCOUNTER — Encounter (HOSPITAL_COMMUNITY): Payer: Self-pay

## 2022-03-02 DIAGNOSIS — T43206A Underdosing of unspecified antidepressants, initial encounter: Secondary | ICD-10-CM | POA: Diagnosis present

## 2022-03-02 DIAGNOSIS — F419 Anxiety disorder, unspecified: Secondary | ICD-10-CM | POA: Diagnosis present

## 2022-03-02 DIAGNOSIS — Z803 Family history of malignant neoplasm of breast: Secondary | ICD-10-CM | POA: Diagnosis not present

## 2022-03-02 DIAGNOSIS — R112 Nausea with vomiting, unspecified: Principal | ICD-10-CM

## 2022-03-02 DIAGNOSIS — E860 Dehydration: Secondary | ICD-10-CM | POA: Diagnosis not present

## 2022-03-02 DIAGNOSIS — I10 Essential (primary) hypertension: Secondary | ICD-10-CM | POA: Diagnosis present

## 2022-03-02 DIAGNOSIS — K381 Appendicular concretions: Secondary | ICD-10-CM | POA: Diagnosis not present

## 2022-03-02 DIAGNOSIS — E876 Hypokalemia: Secondary | ICD-10-CM | POA: Diagnosis not present

## 2022-03-02 DIAGNOSIS — Z9221 Personal history of antineoplastic chemotherapy: Secondary | ICD-10-CM

## 2022-03-02 DIAGNOSIS — E222 Syndrome of inappropriate secretion of antidiuretic hormone: Secondary | ICD-10-CM | POA: Diagnosis present

## 2022-03-02 DIAGNOSIS — R197 Diarrhea, unspecified: Secondary | ICD-10-CM | POA: Diagnosis not present

## 2022-03-02 DIAGNOSIS — N390 Urinary tract infection, site not specified: Secondary | ICD-10-CM | POA: Diagnosis not present

## 2022-03-02 DIAGNOSIS — R030 Elevated blood-pressure reading, without diagnosis of hypertension: Secondary | ICD-10-CM

## 2022-03-02 DIAGNOSIS — E871 Hypo-osmolality and hyponatremia: Secondary | ICD-10-CM | POA: Diagnosis present

## 2022-03-02 DIAGNOSIS — Z853 Personal history of malignant neoplasm of breast: Secondary | ICD-10-CM

## 2022-03-02 DIAGNOSIS — Z9012 Acquired absence of left breast and nipple: Secondary | ICD-10-CM | POA: Diagnosis not present

## 2022-03-02 DIAGNOSIS — Z789 Other specified health status: Secondary | ICD-10-CM | POA: Diagnosis not present

## 2022-03-02 DIAGNOSIS — U071 COVID-19: Secondary | ICD-10-CM | POA: Diagnosis not present

## 2022-03-02 DIAGNOSIS — F32A Depression, unspecified: Secondary | ICD-10-CM | POA: Diagnosis not present

## 2022-03-02 LAB — CBC
HCT: 36.8 % (ref 36.0–46.0)
Hemoglobin: 12.9 g/dL (ref 12.0–15.0)
MCH: 34.9 pg — ABNORMAL HIGH (ref 26.0–34.0)
MCHC: 35.1 g/dL (ref 30.0–36.0)
MCV: 99.5 fL (ref 80.0–100.0)
Platelets: 299 10*3/uL (ref 150–400)
RBC: 3.7 MIL/uL — ABNORMAL LOW (ref 3.87–5.11)
RDW: 13.5 % (ref 11.5–15.5)
WBC: 4 10*3/uL (ref 4.0–10.5)
nRBC: 0 % (ref 0.0–0.2)

## 2022-03-02 LAB — URINALYSIS, ROUTINE W REFLEX MICROSCOPIC
Bilirubin Urine: NEGATIVE
Glucose, UA: NEGATIVE mg/dL
Hgb urine dipstick: NEGATIVE
Ketones, ur: 5 mg/dL — AB
Nitrite: NEGATIVE
Protein, ur: NEGATIVE mg/dL
Specific Gravity, Urine: 1.009 (ref 1.005–1.030)
pH: 7 (ref 5.0–8.0)

## 2022-03-02 LAB — LIPASE, BLOOD: Lipase: 22 U/L (ref 11–51)

## 2022-03-02 LAB — COMPREHENSIVE METABOLIC PANEL
ALT: 43 U/L (ref 0–44)
AST: 49 U/L — ABNORMAL HIGH (ref 15–41)
Albumin: 4.5 g/dL (ref 3.5–5.0)
Alkaline Phosphatase: 63 U/L (ref 38–126)
Anion gap: 10 (ref 5–15)
BUN: 7 mg/dL — ABNORMAL LOW (ref 8–23)
CO2: 24 mmol/L (ref 22–32)
Calcium: 9.1 mg/dL (ref 8.9–10.3)
Chloride: 86 mmol/L — ABNORMAL LOW (ref 98–111)
Creatinine, Ser: 0.53 mg/dL (ref 0.44–1.00)
GFR, Estimated: >60 mL/min (ref >60)
Glucose, Bld: 90 mg/dL (ref 70–99)
Potassium: 4.5 mmol/L (ref 3.5–5.1)
Sodium: 120 mmol/L — ABNORMAL LOW (ref 135–145)
Total Bilirubin: 0.7 mg/dL (ref 0.3–1.2)
Total Protein: 7.3 g/dL (ref 6.5–8.1)

## 2022-03-02 LAB — SARS CORONAVIRUS 2 BY RT PCR: SARS Coronavirus 2 by RT PCR: POSITIVE — AB

## 2022-03-02 MED ORDER — THIAMINE HCL 100 MG/ML IJ SOLN
100.0000 mg | Freq: Every day | INTRAMUSCULAR | Status: DC
  Administered 2022-03-02: 100 mg via INTRAVENOUS
  Filled 2022-03-02: qty 2

## 2022-03-02 MED ORDER — SODIUM CHLORIDE 0.9 % IV BOLUS
1000.0000 mL | Freq: Once | INTRAVENOUS | Status: AC
  Administered 2022-03-02: 1000 mL via INTRAVENOUS

## 2022-03-02 MED ORDER — IOHEXOL 300 MG/ML  SOLN
100.0000 mL | Freq: Once | INTRAMUSCULAR | Status: AC | PRN
  Administered 2022-03-02: 80 mL via INTRAVENOUS

## 2022-03-02 MED ORDER — ONDANSETRON HCL 4 MG PO TABS: 4.0000 mg | ORAL_TABLET | Freq: Four times a day (QID) | ORAL | Status: DC | PRN

## 2022-03-02 MED ORDER — FOLIC ACID 1 MG PO TABS
1.0000 mg | ORAL_TABLET | Freq: Every day | ORAL | Status: DC
  Administered 2022-03-02 – 2022-03-05 (×4): 1 mg via ORAL
  Filled 2022-03-02 (×4): qty 1

## 2022-03-02 MED ORDER — ONDANSETRON HCL 4 MG/2ML IJ SOLN: 4.0000 mg | Freq: Four times a day (QID) | INTRAMUSCULAR | Status: DC | PRN

## 2022-03-02 MED ORDER — SODIUM CHLORIDE 0.9 % IV SOLN
1.0000 g | Freq: Once | INTRAVENOUS | Status: AC
  Administered 2022-03-02: 1 g via INTRAVENOUS
  Filled 2022-03-02: qty 10

## 2022-03-02 MED ORDER — ACETAMINOPHEN 650 MG RE SUPP: 650.0000 mg | Freq: Four times a day (QID) | RECTAL | Status: DC | PRN

## 2022-03-02 MED ORDER — ACETAMINOPHEN 325 MG PO TABS
650.0000 mg | ORAL_TABLET | Freq: Four times a day (QID) | ORAL | Status: DC | PRN
  Administered 2022-03-03 (×2): 650 mg via ORAL
  Filled 2022-03-02 (×3): qty 2

## 2022-03-02 MED ORDER — ONDANSETRON HCL 4 MG/2ML IJ SOLN
4.0000 mg | Freq: Once | INTRAMUSCULAR | Status: AC
  Administered 2022-03-02: 4 mg via INTRAVENOUS
  Filled 2022-03-02: qty 2

## 2022-03-02 MED ORDER — THIAMINE MONONITRATE 100 MG PO TABS
100.0000 mg | ORAL_TABLET | Freq: Every day | ORAL | Status: DC
  Administered 2022-03-03 – 2022-03-05 (×3): 100 mg via ORAL
  Filled 2022-03-02 (×3): qty 1

## 2022-03-02 MED ORDER — SODIUM CHLORIDE 0.9 % IV SOLN: INTRAVENOUS | Status: DC

## 2022-03-02 MED ORDER — LORAZEPAM 2 MG/ML IJ SOLN: 1.0000 mg | INTRAMUSCULAR | Status: DC | PRN

## 2022-03-02 MED ORDER — LORAZEPAM 1 MG PO TABS: 1.0000 mg | ORAL_TABLET | ORAL | Status: DC | PRN

## 2022-03-02 MED ORDER — ADULT MULTIVITAMIN W/MINERALS CH
1.0000 | ORAL_TABLET | Freq: Every day | ORAL | Status: DC
  Administered 2022-03-02 – 2022-03-05 (×4): 1 via ORAL
  Filled 2022-03-02 (×4): qty 1

## 2022-03-02 MED ORDER — NIRMATRELVIR/RITONAVIR (PAXLOVID)TABLET
3.0000 | ORAL_TABLET | Freq: Two times a day (BID) | ORAL | Status: DC
  Administered 2022-03-03 – 2022-03-05 (×6): 3 via ORAL
  Filled 2022-03-02: qty 30

## 2022-03-02 MED ORDER — ENOXAPARIN SODIUM 40 MG/0.4ML IJ SOSY
40.0000 mg | PREFILLED_SYRINGE | INTRAMUSCULAR | Status: DC
  Administered 2022-03-02 – 2022-03-04 (×3): 40 mg via SUBCUTANEOUS
  Filled 2022-03-02 (×3): qty 0.4

## 2022-03-02 MED ORDER — HYDROCODONE-ACETAMINOPHEN 5-325 MG PO TABS
1.0000 | ORAL_TABLET | Freq: Once | ORAL | Status: AC
  Administered 2022-03-02: 1 via ORAL
  Filled 2022-03-02: qty 1

## 2022-03-02 MED ORDER — ANASTROZOLE 1 MG PO TABS
1.0000 mg | ORAL_TABLET | Freq: Every day | ORAL | Status: DC
  Administered 2022-03-03 – 2022-03-05 (×3): 1 mg via ORAL
  Filled 2022-03-02 (×6): qty 1

## 2022-03-02 MED ORDER — GABAPENTIN 300 MG PO CAPS
300.0000 mg | ORAL_CAPSULE | Freq: Two times a day (BID) | ORAL | Status: DC
  Administered 2022-03-03 – 2022-03-05 (×6): 300 mg via ORAL
  Filled 2022-03-02 (×6): qty 1

## 2022-03-02 NOTE — Assessment & Plan Note (Addendum)
-   Sodium level 120 >> 124 >>> 134  Past  2 recent hospitalizations for same, down to 113 in the recent past.  - Her SSRI was discontinued during recent hospitalization.  She reports she drinks about 3 beers daily, has been cutting down, ? beer potomania.  Vomiting may be secondary to her hyponatremia, or her GI loses may be causing hyponatremia. - Evaluated by nephrology, concern for SIADH, possible element of tea and toast diet. -1 L bolus given, continue IVF NS

## 2022-03-02 NOTE — Assessment & Plan Note (Addendum)
Nausea vomiting has improved -Stool pathogen positive for E. Coli -Starting p.o. ciprofloxacin today 03/04/2022 along with lactobacillus   - Recent hospitalization for same, no abdominal imaging on file.   - History of breast cancer diagnosed last year. -Obtain CT abdomen and pelvis-reviewed multiple reviewed, findings suggestive of gastritis, 9 mm lesion left lobe liver-nonspecific, 7 mm cystic lesion in pancreatic uncinate process, appendicoliths, small hiatal hernia, aortic atherosclerosis  (Further imaging such as MRCP will be deferred to patient's oncologist Dr. Delton Coombes -follow-up as an outpatient)   -Continue IV hydration -Clear liquid diet, advance as tolerated

## 2022-03-02 NOTE — ED Triage Notes (Signed)
Pt presents to ED with complaints of diarrhea x 2 days, vomiting since last night.

## 2022-03-02 NOTE — Assessment & Plan Note (Addendum)
-   BP Remained stable   Low dose lisinopril discontinued during recent hospitalization.

## 2022-03-02 NOTE — Assessment & Plan Note (Addendum)
-  Remained asymptomatic with exception of headaches and diarrhea -No upper respiratory symptoms -SARS-CoV-2 positive .  -Paxlovid -Chest x-ray -clear

## 2022-03-02 NOTE — ED Notes (Signed)
Patient transported to CT 

## 2022-03-02 NOTE — H&P (Addendum)
History and Physical    Samantha Mcfarland ZYS:[id number] DOB: Nov 23, 1955 DOA: 03/02/2022  PCP: Baruch Gouty, FNP   Patient coming from: Home  I have personally briefly reviewed patient's old medical records in [place]  Chief Complaint: Vomiting, Diarrhea  HPI: Samantha Mcfarland is a 66 y.o. female with medical history significant for breast cancer, anxiety, depression, hypertension. Patient presented to the ED with complaints of 3-4 episodes of vomiting that started last night.  About 3-4 episodes of loose stools daily over the past 3-4 days.  Reports stools are loose, not watery or as severe as they were during recent hospitalizations. She denies abdominal pain.  Denies marijuana use.  She drinks about 3 beers daily, she has been cutting down on the amount she drinks.  Prior documentation states up to 20 beers per week. She reports headaches.  Recent hospitalization 8/14 to 8/17, and 8/5 to 8/7 both for hyponatremia, with symptoms of nausea vomiting and diarrhea.  Lowest sodium down to 113.  Sodium was 126 on discharge, and 134 when she followed up with her outpatient provider..  Nephrology was consulted during last hospitalization, renal osmolality was consistent with SIADH, with elements of tea and toast diet.  Patient had been started on fluoxetine 2 weeks prior, recommended that she stop the fluoxetine.  She reports since discharge, she has stopped taking her antidepressant- Zoloft, though fluoxetine is what was mentioned in prior notes.  She also stop taking lisinopril, which was also discontinued during recent hospitalization  ED Course: Tmax 98.2.  Heart rate 59-69.  Respirate rate 11-16.  Blood pressure systolic 932. - 355.  Sodium 128.  1 L bolus given.  UA suggestive of UTI with positive leukocytes, IV ceftriaxone started and urine cultures ordered.   Hospitalist to admit for hyponatremia.  Review of Systems: As per HPI all other systems reviewed and negative.  Past Medical  History:  Diagnosis Date   Allergy    Anxiety    Breast cancer ([place])    Depression    Family history of breast cancer    Family history of colon cancer    Family history of pancreatic cancer    Family history of thyroid cancer    GERD (gastroesophageal reflux disease)    Hyperlipidemia    Hypertension    Osteopenia 04/30/2021   Port-A-Cath in place 05/07/2021   Vitamin D insufficiency     Past Surgical History:  Procedure Laterality Date   EXCISION OF SKIN TAG Right 05/27/2021   Procedure: EXCISION OF RIGHT SKIN TAG;  Surgeon: Virl Cagey, MD;  Location: [facility];  Service: General;  Laterality: Right;   MASTECTOMY W/ SENTINEL NODE BIOPSY Left 03/23/2021   Procedure: TOTAL MASTECTOMY WITH SENTINEL LYMPH NODE BIOPSY;  Surgeon: Virl Cagey, MD;  Location: [facility];  Service: General;  Laterality: Left;   NECK SURGERY     OOPHORECTOMY     PORT-A-CATH REMOVAL Right 09/23/2021   Procedure: MINOR REMOVAL PORT-A-CATH;  Surgeon: Virl Cagey, MD;  Location: [facility];  Service: General;  Laterality: Right;   PORTACATH PLACEMENT Right 05/27/2021   Procedure: INSERTION PORT-A-CATH;  Surgeon: Virl Cagey, MD;  Location: [facility];  Service: General;  Laterality: Right;   SALPINGECTOMY       reports that she has quit smoking. Her smoking use included cigarettes. She smoked an average of .25 packs per day. She has never used smokeless tobacco. She reports current alcohol use of about 20.0 standard drinks of alcohol  per week. She reports that she does not use drugs.  Allergies  Allergen Reactions   Ibuprofen Other (See Comments)    "Stomach upset"   Penicillins Rash    Family History  Problem Relation Age of Onset   Stroke Mother    Alcohol abuse Mother    Diabetes Father    Pancreatic cancer Father 76   Testicular cancer Father        dx 68s   Pancreatic disease Father    Breast cancer Maternal Aunt        dx 50s-60s   Breast cancer Paternal Aunt 36   Thyroid  cancer Paternal Uncle    Breast cancer Maternal Grandmother        dx 19s   Colon cancer Paternal Grandfather        dx 45s    Prior to Admission medications   Medication Sig Start Date End Date Taking? Authorizing Provider  acetaminophen (TYLENOL) 325 MG tablet Take 325 mg by mouth every 6 (six) hours as needed for moderate pain or headache.   Yes [provider]  anastrozole (ARIMIDEX) 1 MG tablet Take 1 tablet (1 mg total) by mouth daily. 08/27/21  Yes Derek Jack, MD  atorvastatin (LIPITOR) 20 MG tablet Take 1 tablet (20 mg total) by mouth daily. (NEEDS TO BE SEEN BEFORE NEXT REFILL) 11/11/21  Yes Hendricks Limes F, FNP  cholecalciferol (VITAMIN D3) 25 MCG (1000 UNIT) tablet Take 1,000 Units by mouth daily.   Yes [provider]  diphenhydrAMINE HCl (ALLERGY MEDICATION PO) Take 1 tablet by mouth as needed (allergies).   Yes [provider]  gabapentin (NEURONTIN) 300 MG capsule TAKE 1 CAPSULE 2 TIMES A DAY Patient taking differently: Take 300 mg by mouth 2 (two) times daily. 02/08/22  Yes Derek Jack, MD  HYDROcodone-acetaminophen (NORCO/VICODIN) 5-325 MG tablet Take 1 tablet by mouth every 6 (six) hours as needed for moderate pain.   Yes [provider]  omeprazole (PRILOSEC) 20 MG capsule Take 1 capsule (20 mg total) by mouth daily as needed (indigestion/heartburn). 05/01/21  Yes Loman Brooklyn, FNP  ondansetron (ZOFRAN) 4 MG tablet Take 1 tablet (4 mg total) by mouth every 8 (eight) hours as needed for nausea or vomiting. 01/05/22  Yes Hawks, Christy A, FNP  vitamin B-12 (CYANOCOBALAMIN) 500 MCG tablet Take 500 mcg by mouth daily.   Yes [provider]  predniSONE (DELTASONE) 10 MG tablet Take by mouth. Patient not taking: Reported on 03/02/2022 02/09/22   [provider]  traMADol (ULTRAM) 50 MG tablet Take 1 tablet (50 mg total) by mouth every 6 (six) hours as needed for severe pain. Patient not taking: Reported on  03/02/2022 02/04/22 02/04/23  Heath Lark D, DO    Physical Exam: Vitals:   03/02/22 1138 03/02/22 1437 03/02/22 1500 03/02/22 1549  BP: (!) 133/90 (!) 150/82 (!) 146/75 (!) 147/87  Pulse: 69 62 (!) 59 61  Resp: '16  11 16  '$ Temp: 97.7 F (36.5 C)   98.2 F (36.8 C)  TempSrc: Oral   Oral  SpO2: 100% 100% 98% 98%  Weight:      Height:        Constitutional: NAD, calm, comfortable Vitals:   03/02/22 1138 03/02/22 1437 03/02/22 1500 03/02/22 1549  BP: (!) 133/90 (!) 150/82 (!) 146/75 (!) 147/87  Pulse: 69 62 (!) 59 61  Resp: '16  11 16  '$ Temp: 97.7 F (36.5 C)   98.2 F (36.8 C)  TempSrc:  Oral   Oral  SpO2: 100% 100% 98% 98%  Weight:      Height:       Eyes: PERRL, lids and conjunctivae normal ENMT: Mucous membranes are moist.   Neck: normal, supple, no masses, no thyromegaly Respiratory: clear to auscultation bilaterally, no wheezing, no crackles.  Cardiovascular: Regular rate and rhythm, no murmurs / rubs / gallops. No extremity edema.  Abdomen: no tenderness, no masses palpated. No hepatosplenomegaly. Bowel sounds positive.  Musculoskeletal: no clubbing / cyanosis. No joint deformity upper and lower extremities. Good ROM, no contractures. Normal muscle tone.  Skin: no rashes, lesions, ulcers. No induration Neurologic: no apparent cranial nerve abnormality, moving extremities spontaneously  Psychiatric: Normal judgment and insight. Alert and oriented x 3. Normal mood.   Labs on Admission: I have personally reviewed following labs and imaging studies  CBC: Recent Labs  Lab 03/02/22 1153  WBC 4.0  HGB 12.9  HCT 36.8  MCV 99.5  PLT 798   Basic Metabolic Panel: Recent Labs  Lab 03/02/22 1153  NA 120*  K 4.5  CL 86*  CO2 24  GLUCOSE 90  BUN 7*  CREATININE 0.53  CALCIUM 9.1   GFR: Estimated Creatinine Clearance: 60.2 mL/min (by C-G formula based on SCr of 0.53 mg/dL). Liver Function Tests: Recent Labs  Lab 03/02/22 1153  AST 49*  ALT 43  ALKPHOS 63   BILITOT 0.7  PROT 7.3  ALBUMIN 4.5   Recent Labs  Lab 03/02/22 1153  LIPASE 22    Radiological Exams on Admission: No results found.  EKG: Independently reviewed.  Sinus rhythm rate 65,  QTC 480.   No significant change from prior.  Assessment/Plan Principal Problem:   Hyponatremia Active Problems:   Nausea vomiting and diarrhea   Essential hypertension   History of left breast cancer   Alcohol use   Assessment and Plan: * Hyponatremia Sodium down to 120.  2 recent hospitalizations for same, down to 113 in the recent past.  Her SSRI was discontinued during recent hospitalization.  She reports she drinks about 3 beers daily, has been cutting down, ? beer potomania.  Vomiting may be secondary to her hyponatremia, or her GI loses may be causing hyponatremia. - Evaluated by nephrology, concern for SIADH, possible element of tea and toast diet. -1 L bolus given, continue N/s 75cc/hr   Nausea vomiting and diarrhea Nausea vomiting diarrhea.  Vomiting may be symptomatic from her hyponatremia versus other GI causes.  She denies marijuana use.  Recent hospitalization for same, no abdominal imaging on file.  History of breast cancer diagnosed last year. -Obtain CT abdomen and pelvis -Hydrate -Clear liquid diet, advance as tolerated -GI pathogen panel, stool C. Difficile - Check Covid  Essential hypertension Stable.  Low dose lisinopril discontinued during recent hospitalization.  COVID-19 virus infection Results later came back positive for COVID.  Presenting with GI symptoms, denies respiratory symptoms. Not hypoxic.  -Paxlovid - Obtain and trend inflammatory panel -Chest x-ray  Alcohol use Current she drinks 3 beers daily.  Reports she is cutting down.  Last drink at about 3 to 4 PM yesterday.  No history of alcohol withdrawal seizures, but reports history of "shakes" in the past when she drank much more. - CIWA PRN - Thiamine, folate, multivitamins  History of left  breast cancer  Status post left mastectomy, chemotherapy.  Last chemo 07/2021.  Follows with Dr. Delton Coombes.  Oncology also consulted during recent hospitalization, did not feel her malignancy is related to her  hyponatremia, plan was to consider further imaging if sodium did not improve. - She is on anastrozole, resume   DVT prophylaxis: Lovenox Code Status: Full  Family Communication:  Sister Santiago Glad at bedside Disposition Plan: ~ 2 days Consults called: None  Admission status:  Obs tele  Author: Bethena Roys, MD 03/02/2022 5:32 PM  For on call review [email or web address].

## 2022-03-02 NOTE — ED Provider Notes (Addendum)
[facility] EMERGENCY DEPARTMENT Provider Note   CSN: [id number] Arrival date & time: 03/02/22  1124     History  Chief Complaint  Patient presents with   Diarrhea    Samantha Mcfarland is a 66 y.o. female.  Patient with hx hyponatremia, c/o general fatigue, weakness, nausea, diarrhea, recent vomiting, and feeling similar to when Na low in past. Pt with hx recurrent hyponatremia. Of note, chart notes consumption of ~ 20 beers per week, pt confirms - ?possible beer potomania. Last emesis yesterday. Nausea persists. Denies abd pain. Diarrhea last episode this AM, loose. Denies recent abx use or travel. No known bad food ingestion or ill contacts. No fever or chills.  The history is provided by the patient, medical records and a relative.  Diarrhea Associated symptoms: vomiting   Associated symptoms: no abdominal pain, no chills, no fever and no headaches        Home Medications Prior to Admission medications   Medication Sig Start Date End Date Taking? Authorizing Provider  acetaminophen (TYLENOL) 325 MG tablet Take 325 mg by mouth every 6 (six) hours as needed for moderate pain or headache.   Yes [provider]  anastrozole (ARIMIDEX) 1 MG tablet Take 1 tablet (1 mg total) by mouth daily. 08/27/21  Yes Derek Jack, MD  atorvastatin (LIPITOR) 20 MG tablet Take 1 tablet (20 mg total) by mouth daily. (NEEDS TO BE SEEN BEFORE NEXT REFILL) 11/11/21  Yes Hendricks Limes F, FNP  cholecalciferol (VITAMIN D3) 25 MCG (1000 UNIT) tablet Take 1,000 Units by mouth daily.   Yes [provider]  diphenhydrAMINE HCl (ALLERGY MEDICATION PO) Take 1 tablet by mouth as needed (allergies).   Yes [provider]  gabapentin (NEURONTIN) 300 MG capsule TAKE 1 CAPSULE 2 TIMES A DAY Patient taking differently: Take 300 mg by mouth 2 (two) times daily. 02/08/22  Yes Derek Jack, MD  HYDROcodone-acetaminophen (NORCO/VICODIN) 5-325 MG tablet Take 1 tablet by mouth every 6  (six) hours as needed for moderate pain.   Yes [provider]  omeprazole (PRILOSEC) 20 MG capsule Take 1 capsule (20 mg total) by mouth daily as needed (indigestion/heartburn). 05/01/21  Yes Loman Brooklyn, FNP  ondansetron (ZOFRAN) 4 MG tablet Take 1 tablet (4 mg total) by mouth every 8 (eight) hours as needed for nausea or vomiting. 01/05/22  Yes Hawks, Christy A, FNP  vitamin B-12 (CYANOCOBALAMIN) 500 MCG tablet Take 500 mcg by mouth daily.   Yes [provider]  predniSONE (DELTASONE) 10 MG tablet Take by mouth. Patient not taking: Reported on 03/02/2022 02/09/22   [provider]  traMADol (ULTRAM) 50 MG tablet Take 1 tablet (50 mg total) by mouth every 6 (six) hours as needed for severe pain. Patient not taking: Reported on 03/02/2022 02/04/22 02/04/23  Heath Lark D, DO      Allergies    Ibuprofen and Penicillins    Review of Systems   Review of Systems  Constitutional:  Negative for chills and fever.  HENT:  Negative for sore throat.   Eyes:  Negative for redness.  Respiratory:  Negative for cough and shortness of breath.   Cardiovascular:  Negative for chest pain.  Gastrointestinal:  Positive for diarrhea, nausea and vomiting. Negative for abdominal pain.  Genitourinary:  Negative for dysuria and flank pain.  Musculoskeletal:  Negative for back pain and neck pain.  Skin:  Negative for rash.  Neurological:  Positive for weakness. Negative for headaches.  Hematological:  Does not bruise/bleed easily.  Psychiatric/Behavioral:  Negative for confusion.     Physical Exam Updated Vital Signs BP (!) 140/95 (BP Location: Right Arm)   Pulse 68   Temp 97.7 F (36.5 C) (Oral)   Resp 12   Ht 1.676 m ('5\' 6"'$ )   Wt 54.4 kg   SpO2 99%   BMI 19.37 kg/m  Physical Exam Vitals and nursing note reviewed.  Constitutional:      Appearance: Normal appearance. She is well-developed.  HENT:     Head: Atraumatic.     Nose: Nose normal.     Mouth/Throat:      Mouth: Mucous membranes are moist.  Eyes:     General: No scleral icterus.    Conjunctiva/sclera: Conjunctivae normal.     Pupils: Pupils are equal, round, and reactive to light.  Neck:     Trachea: No tracheal deviation.  Cardiovascular:     Rate and Rhythm: Normal rate and regular rhythm.     Pulses: Normal pulses.     Heart sounds: Normal heart sounds. No murmur heard.    No friction rub. No gallop.  Pulmonary:     Effort: Pulmonary effort is normal. No respiratory distress.     Breath sounds: Normal breath sounds.  Abdominal:     General: Bowel sounds are normal. There is no distension.     Palpations: Abdomen is soft. There is no mass.     Tenderness: There is no abdominal tenderness. There is no guarding.  Genitourinary:    Comments: No cva tenderness.  Musculoskeletal:        General: No swelling or tenderness.     Cervical back: Normal range of motion and neck supple. No rigidity. No muscular tenderness.     Right lower leg: No edema.     Left lower leg: No edema.  Skin:    General: Skin is warm and dry.     Findings: No rash.  Neurological:     Mental Status: She is alert.     Comments: Alert, speech normal.   Psychiatric:        Mood and Affect: Mood normal.     ED Results / Procedures / Treatments   Labs (all labs ordered are listed, but only abnormal results are displayed) Results for orders placed or performed during the hospital encounter of 03/02/22  Lipase, blood  Result Value Ref Range   Lipase 22 11 - 51 U/L  Comprehensive metabolic panel  Result Value Ref Range   Sodium 120 (L) 135 - 145 mmol/L   Potassium 4.5 3.5 - 5.1 mmol/L   Chloride 86 (L) 98 - 111 mmol/L   CO2 24 22 - 32 mmol/L   Glucose, Bld 90 70 - 99 mg/dL   BUN 7 (L) 8 - 23 mg/dL   Creatinine, Ser 0.53 0.44 - 1.00 mg/dL   Calcium 9.1 8.9 - 10.3 mg/dL   Total Protein 7.3 6.5 - 8.1 g/dL   Albumin 4.5 3.5 - 5.0 g/dL   AST 49 (H) 15 - 41 U/L   ALT 43 0 - 44 U/L   Alkaline Phosphatase  63 38 - 126 U/L   Total Bilirubin 0.7 0.3 - 1.2 mg/dL   GFR, Estimated >60 >60 mL/min   Anion gap 10 5 - 15  CBC  Result Value Ref Range   WBC 4.0 4.0 - 10.5 K/uL   RBC 3.70 (L) 3.87 - 5.11 MIL/uL   Hemoglobin 12.9 12.0 - 15.0 g/dL   HCT 36.8 36.0 -  46.0 %   MCV 99.5 80.0 - 100.0 fL   MCH 34.9 (H) 26.0 - 34.0 pg   MCHC 35.1 30.0 - 36.0 g/dL   RDW 13.5 11.5 - 15.5 %   Platelets 299 150 - 400 K/uL   nRBC 0.0 0.0 - 0.2 %  Urinalysis, Routine w reflex microscopic Urine, Clean Catch  Result Value Ref Range   Color, Urine YELLOW YELLOW   APPearance HAZY (A) CLEAR   Specific Gravity, Urine 1.009 1.005 - 1.030   pH 7.0 5.0 - 8.0   Glucose, UA NEGATIVE NEGATIVE mg/dL   Hgb urine dipstick NEGATIVE NEGATIVE   Bilirubin Urine NEGATIVE NEGATIVE   Ketones, ur 5 (A) NEGATIVE mg/dL   Protein, ur NEGATIVE NEGATIVE mg/dL   Nitrite NEGATIVE NEGATIVE   Leukocytes,Ua MODERATE (A) NEGATIVE   RBC / HPF 0-5 0 - 5 RBC/hpf   WBC, UA 21-50 0 - 5 WBC/hpf   Bacteria, UA RARE (A) NONE SEEN   Squamous Epithelial / LPF 0-5 0 - 5   WBC Clumps PRESENT    MM 3D SCREEN BREAST UNI RIGHT  Result Date: 02/26/2022 CLINICAL DATA:  Screening. EXAM: DIGITAL SCREENING UNILATERAL RIGHT MAMMOGRAM WITH CAD AND TOMOSYNTHESIS TECHNIQUE: Right screening digital craniocaudal and mediolateral oblique mammograms were obtained. Right screening digital breast tomosynthesis was performed. The images were evaluated with computer-aided detection. COMPARISON:  Previous exam(s). ACR Breast Density Category d: The breast tissue is extremely dense, which lowers the sensitivity of mammography. FINDINGS: The patient has had a left mastectomy. There are no findings suspicious for malignancy. IMPRESSION: No mammographic evidence of malignancy. A result letter of this screening mammogram will be mailed directly to the patient. RECOMMENDATION: Screening mammogram in one year.  (Code:SM-R-41M) BI-RADS CATEGORY  1: Negative. -Please Insert Correct  Screening Template Electronically Signed   By: Ammie Ferrier M.D.   On: 02/26/2022 12:41   CT Head Wo Contrast  Result Date: 02/02/2022 CLINICAL DATA:  Dizziness and recent falls, initial encounter EXAM: CT HEAD WITHOUT CONTRAST TECHNIQUE: Contiguous axial images were obtained from the base of the skull through the vertex without intravenous contrast. RADIATION DOSE REDUCTION: This exam was performed according to the departmental dose-optimization program which includes automated exposure control, adjustment of the mA and/or kV according to patient size and/or use of iterative reconstruction technique. COMPARISON:  None Available. FINDINGS: Brain: No evidence of acute infarction, hemorrhage, hydrocephalus, extra-axial collection or mass lesion/mass effect. Vascular: No hyperdense vessel or unexpected calcification. Skull: Normal. Negative for fracture or focal lesion. Sinuses/Orbits: No acute finding. Other: None. IMPRESSION: No acute intracranial abnormality no Electronically Signed   By: Inez Catalina M.D.   On: 02/02/2022 02:13     EKG EKG Interpretation  Date/Time:  Tuesday March 02 2022 16:36:57 EDT Ventricular Rate:  65 PR Interval:  190 QRS Duration: 112 QT Interval:  461 QTC Calculation: 480 R Axis:   50 Text Interpretation: Sinus rhythm Non-specific ST-t changes Confirmed by Lajean Saver [phone]) on 03/02/2022 5:16:24 PM  Radiology Recent ct neg acute.  Procedures Procedures    Medications Ordered in ED Medications  HYDROcodone-acetaminophen (NORCO/VICODIN) 5-325 MG per tablet 1 tablet (has no administration in time range)  sodium chloride 0.9 % bolus 1,000 mL (0 mLs Intravenous Stopped 03/02/22 1659)  ondansetron (ZOFRAN) injection 4 mg (4 mg Intravenous Given 03/02/22 1548)    ED Course/ Medical Decision Making/ A&P  Medical Decision Making Problems Addressed: Acute UTI: acute illness or injury Dehydration: acute illness or injury with  systemic symptoms that poses a threat to life or bodily functions Elevated blood pressure reading: acute illness or injury Hyponatremia: acute illness or injury with systemic symptoms that poses a threat to life or bodily functions    Details: Acute on recurrent Nausea vomiting and diarrhea: acute illness or injury with systemic symptoms that poses a threat to life or bodily functions  Amount and/or Complexity of Data Reviewed Independent Historian:     Details: Family/friend External Data Reviewed: labs and notes. Labs: ordered. Decision-making details documented in ED Course. Radiology: independent interpretation performed. Decision-making details documented in ED Course. ECG/medicine tests: ordered and independent interpretation performed. Decision-making details documented in ED Course. Discussion of management or test interpretation with external provider(s): Hospitalist - discussed pt.   Risk Prescription drug management. Decision regarding hospitalization.  Iv ns. Continuous pulse ox and cardiac monitoring. Labs ordered/sent.   Reviewed nursing notes and prior charts for additional history. External reports reviewed. Additional history from: family/friend.   Cardiac monitor: sinus rhythm, rate 66.  Labs reviewed/interpreted by me - NA low, 120, pt symptomatic.   Ns bolus. Hospitalists consulted for admission.   Recent head CT reviewed/interpreted by me - no hem.   Additional ivf.   Discussed pt with hospitalist - will admit.   Additional labs returned, reviewed/interpreted - ua w possible uti. Will cx and tx.       Final Clinical Impression(s) / ED Diagnoses Final diagnoses:  Nausea vomiting and diarrhea  Dehydration  Hyponatremia  Elevated blood pressure reading    Rx / DC Orders ED Discharge Orders     None            Lajean Saver, MD 03/02/22 1718

## 2022-03-02 NOTE — Progress Notes (Signed)
Alert and oriented on admission and able to walk to bathroom with cane and standby.  Stated last bm was this morning and last emesis was yesterday.  Received clear liquid tray and has held that down.  Covid test and urine sent to lab.  C/o headache and received scheduled pain med.

## 2022-03-02 NOTE — Progress Notes (Signed)
Patient's sister called with complaints of the patient having stomach bug type symptoms. She reports feeling sick and diarrhea. States that she was recently hospitalized for the same symptoms as her electrolytes had begun to drop at that time. Patient's last chemotherapy treatment was in February 2023. Patient's sister states that she was previously told it is related to new antihypertensive medication. Instructed patient's sister to follow-up with patient's PCP or the ER if she felt necessary. Patient's sister verbalized understanding.

## 2022-03-02 NOTE — Assessment & Plan Note (Addendum)
Status post left mastectomy, chemotherapy.   Last chemo 07/2021.   - Follows with Dr. Delton Coombes --reconsulted but he is out of the country for 2 weeks -CT finding concerning for new liver lesions--- referral to [facility] for further imaging and evaluation as an outpatient. - She is on anastrozole, resume

## 2022-03-02 NOTE — Assessment & Plan Note (Addendum)
-  No signs of withdrawals, seizures or tremors at this point She is admitting to drinking- drinks 3 beers daily.  Reports she is cutting down.  L - No history of alcohol withdrawal seizures, but reports history of "shakes" in the past when she drank much more. - CIWA PRN - Thiamine, folate, multivitamins

## 2022-03-03 DIAGNOSIS — F32A Depression, unspecified: Secondary | ICD-10-CM | POA: Diagnosis present

## 2022-03-03 DIAGNOSIS — I10 Essential (primary) hypertension: Secondary | ICD-10-CM | POA: Diagnosis present

## 2022-03-03 DIAGNOSIS — Z853 Personal history of malignant neoplasm of breast: Secondary | ICD-10-CM | POA: Diagnosis not present

## 2022-03-03 DIAGNOSIS — Z9221 Personal history of antineoplastic chemotherapy: Secondary | ICD-10-CM | POA: Diagnosis not present

## 2022-03-03 DIAGNOSIS — Z9012 Acquired absence of left breast and nipple: Secondary | ICD-10-CM | POA: Diagnosis not present

## 2022-03-03 DIAGNOSIS — R112 Nausea with vomiting, unspecified: Secondary | ICD-10-CM | POA: Diagnosis present

## 2022-03-03 DIAGNOSIS — E222 Syndrome of inappropriate secretion of antidiuretic hormone: Secondary | ICD-10-CM | POA: Diagnosis present

## 2022-03-03 DIAGNOSIS — E860 Dehydration: Secondary | ICD-10-CM | POA: Diagnosis present

## 2022-03-03 DIAGNOSIS — U071 COVID-19: Secondary | ICD-10-CM | POA: Diagnosis present

## 2022-03-03 DIAGNOSIS — Z803 Family history of malignant neoplasm of breast: Secondary | ICD-10-CM | POA: Diagnosis not present

## 2022-03-03 DIAGNOSIS — E876 Hypokalemia: Secondary | ICD-10-CM | POA: Diagnosis present

## 2022-03-03 DIAGNOSIS — T43206A Underdosing of unspecified antidepressants, initial encounter: Secondary | ICD-10-CM | POA: Diagnosis present

## 2022-03-03 DIAGNOSIS — E871 Hypo-osmolality and hyponatremia: Secondary | ICD-10-CM | POA: Diagnosis not present

## 2022-03-03 DIAGNOSIS — F419 Anxiety disorder, unspecified: Secondary | ICD-10-CM | POA: Diagnosis present

## 2022-03-03 LAB — CBC WITH DIFFERENTIAL/PLATELET
Abs Immature Granulocytes: 0.01 10*3/uL (ref 0.00–0.07)
Basophils Absolute: 0 10*3/uL (ref 0.0–0.1)
Basophils Relative: 1 %
Eosinophils Absolute: 0.1 10*3/uL (ref 0.0–0.5)
Eosinophils Relative: 4 %
HCT: 31.9 % — ABNORMAL LOW (ref 36.0–46.0)
Hemoglobin: 11.2 g/dL — ABNORMAL LOW (ref 12.0–15.0)
Immature Granulocytes: 0 %
Lymphocytes Relative: 39 %
Lymphs Abs: 1.2 10*3/uL (ref 0.7–4.0)
MCH: 34.5 pg — ABNORMAL HIGH (ref 26.0–34.0)
MCHC: 35.1 g/dL (ref 30.0–36.0)
MCV: 98.2 fL (ref 80.0–100.0)
Monocytes Absolute: 0.5 10*3/uL (ref 0.1–1.0)
Monocytes Relative: 17 %
Neutro Abs: 1.1 10*3/uL — ABNORMAL LOW (ref 1.7–7.7)
Neutrophils Relative %: 39 %
Platelets: 274 10*3/uL (ref 150–400)
RBC: 3.25 MIL/uL — ABNORMAL LOW (ref 3.87–5.11)
RDW: 13.4 % (ref 11.5–15.5)
WBC: 2.9 10*3/uL — ABNORMAL LOW (ref 4.0–10.5)
nRBC: 0 % (ref 0.0–0.2)

## 2022-03-03 LAB — COMPREHENSIVE METABOLIC PANEL
ALT: 32 U/L (ref 0–44)
AST: 36 U/L (ref 15–41)
Albumin: 3.6 g/dL (ref 3.5–5.0)
Alkaline Phosphatase: 51 U/L (ref 38–126)
Anion gap: 4 — ABNORMAL LOW (ref 5–15)
BUN: 6 mg/dL — ABNORMAL LOW (ref 8–23)
CO2: 23 mmol/L (ref 22–32)
Calcium: 8 mg/dL — ABNORMAL LOW (ref 8.9–10.3)
Chloride: 97 mmol/L — ABNORMAL LOW (ref 98–111)
Creatinine, Ser: 0.52 mg/dL (ref 0.44–1.00)
GFR, Estimated: >60 mL/min (ref >60)
Glucose, Bld: 81 mg/dL (ref 70–99)
Potassium: 3.3 mmol/L — ABNORMAL LOW (ref 3.5–5.1)
Sodium: 124 mmol/L — ABNORMAL LOW (ref 135–145)
Total Bilirubin: 0.8 mg/dL (ref 0.3–1.2)
Total Protein: 5.8 g/dL — ABNORMAL LOW (ref 6.5–8.1)

## 2022-03-03 LAB — PHOSPHORUS: Phosphorus: 3.7 mg/dL (ref 2.5–4.6)

## 2022-03-03 LAB — URINE CULTURE: Culture: <10000 — AB

## 2022-03-03 LAB — LACTATE DEHYDROGENASE: LDH: 118 U/L (ref 98–192)

## 2022-03-03 LAB — C-REACTIVE PROTEIN: CRP: <0.5 mg/dL (ref <1.0)

## 2022-03-03 LAB — FERRITIN: Ferritin: 129 ng/mL (ref 11–307)

## 2022-03-03 LAB — D-DIMER, QUANTITATIVE: D-Dimer, Quant: <0.27 ug/mL-FEU (ref 0.00–0.50)

## 2022-03-03 LAB — MAGNESIUM: Magnesium: 1.7 mg/dL (ref 1.7–2.4)

## 2022-03-03 MED ORDER — LOPERAMIDE HCL 2 MG PO CAPS: 2.0000 mg | ORAL_CAPSULE | ORAL | Status: DC | PRN

## 2022-03-03 MED ORDER — SODIUM CHLORIDE 1 G PO TABS
1.0000 g | ORAL_TABLET | Freq: Three times a day (TID) | ORAL | Status: DC
  Administered 2022-03-03 – 2022-03-04 (×5): 1 g via ORAL
  Filled 2022-03-03 (×5): qty 1

## 2022-03-03 MED ORDER — RISAQUAD PO CAPS
1.0000 | ORAL_CAPSULE | Freq: Three times a day (TID) | ORAL | Status: DC
  Administered 2022-03-04 – 2022-03-05 (×4): 1 via ORAL
  Filled 2022-03-03 (×4): qty 1

## 2022-03-03 MED ORDER — HYDROCODONE-ACETAMINOPHEN 5-325 MG PO TABS
1.0000 | ORAL_TABLET | ORAL | Status: DC | PRN
  Administered 2022-03-03 – 2022-03-05 (×6): 1 via ORAL
  Filled 2022-03-03 (×6): qty 1

## 2022-03-03 NOTE — Progress Notes (Signed)
Has had four loose stools today and last one did not have blood but blood on toilet paper when wiped.  Contacted Dr. Roger Shelter concerning this.  Gave no new orders.  Continues to have headache and received prn pain med.

## 2022-03-03 NOTE — Progress Notes (Signed)
No nausea or vomiting since Monday.  Medium BM today that had separate small soft formed stool with some loose mixed in.  Greenish brown.  Sample sent to lab .   100% on room air and no SOB.  Has been ambulating with standby to bathroom .  Stated that Dr. Delton Coombes wanted to be notified if admitted again with hyponatremia so Dr. Nila Nephew put in consult.  Called office and Dr. Delton Coombes is out of country for two weeks.  Notified Dr. Nila Nephew.

## 2022-03-03 NOTE — TOC Initial Note (Signed)
Transition of Care [facility]) - Initial/Assessment Note    Patient Details  Name: Samantha Mcfarland MRN: [id number] Date of Birth: [birth year]  [facility]) CM/SW Contact:    Salome Arnt, [place] Phone Number: 03/03/2022, 8:05 AM  Clinical Narrative:  Pt admitted due to hyponatremia. TOC received consult for substance use. Pt reports she lives alone and manages fine on her own. She ambulates with a cane outside the home. Pt plans to return home at d/c and no needs reported at this time. TOC will continue to follow.  Pt admits to drinking about 3 beers daily. She states she doesn't see this as a problem for her and has been drinking all her life. Discussed resources and pt declines, stating, "I can quit on my own if I want to." She reports several periods of sobriety in the past.                  Expected Discharge Plan: Home/Self Care Barriers to Discharge: Continued Medical Work up   Patient Goals and CMS Choice     Choice offered to / list presented to : Patient  Expected Discharge Plan and Services Expected Discharge Plan: Home/Self Care In-house Referral: Clinical Social Work     Living arrangements for the past 2 months: Apartment                                      Prior Living Arrangements/Services Living arrangements for the past 2 months: Apartment Lives with:: Self Patient language and need for interpreter reviewed:: Yes Do you feel safe going back to the place where you live?: Yes      Need for Family Participation in Patient Care: No (Comment)   Current home services: DME (cane, walker, shower chair) Criminal Activity/Legal Involvement Pertinent to Current Situation/Hospitalization: No - Comment as needed  Activities of Daily Living Home Assistive Devices/Equipment: Cane (specify quad or straight), Eyeglasses, Shower chair with back ADL Screening (condition at time of admission) Patient's cognitive ability adequate to safely complete daily  activities?: Yes Is the patient deaf or have difficulty hearing?: Yes Does the patient have difficulty seeing, even when wearing glasses/contacts?: No Does the patient have difficulty concentrating, remembering, or making decisions?: No Patient able to express need for assistance with ADLs?: Yes Does the patient have difficulty dressing or bathing?: No Independently performs ADLs?: Yes (appropriate for developmental age) Does the patient have difficulty walking or climbing stairs?: No Weakness of Legs: Both Weakness of Arms/Hands: None  Permission Sought/Granted                  Emotional Assessment     Affect (typically observed): Appropriate Orientation: : Oriented to Self, Oriented to Place, Oriented to  Time, Oriented to Situation Alcohol / Substance Use: Alcohol Use Psych Involvement: No (comment)  Admission diagnosis:  Dehydration [E86.0] Hyponatremia [E87.1] Elevated blood pressure reading [R03.0] Acute UTI [N39.0] Nausea vomiting and diarrhea [R11.2, R19.7] Patient Active Problem List   Diagnosis Date Noted   Alcohol use 03/02/2022   COVID-19 virus infection 03/02/2022   Hypokalemia 01/24/2022   Nausea vomiting and diarrhea 01/24/2022   Hyponatremia 01/23/2022   Family history of breast cancer 07/02/2021   Family history of pancreatic cancer 07/02/2021   Family history of colon cancer 07/02/2021   Family history of thyroid cancer 07/02/2021   Genetic testing 07/02/2021   Port-A-Cath in place 05/07/2021  History of left breast cancer 05/01/2021   Essential hypertension 05/01/2021   Mixed hyperlipidemia 05/01/2021   Generalized anxiety disorder 05/01/2021   Gastroesophageal reflux disease 05/01/2021   Moderate recurrent major depression ([place]) 05/01/2021   Chronic neck pain 05/01/2021   Difficulty sleeping 05/01/2021   Osteopenia of multiple sites 04/30/2021   Breast cancer, left ([place]) 04/22/2021   PCP:  Baruch Gouty, FNP Pharmacy:   [facility] [address] [place] [address] Phone: [phone] Fax: [phone]     Social Determinants of Health (SDOH) Interventions Housing Interventions: Intervention Not Indicated  Readmission Risk Interventions     No data to display

## 2022-03-03 NOTE — Progress Notes (Signed)
PROGRESS NOTE    Patient: Samantha Mcfarland                            PCP: Baruch Gouty, FNP                    DOB: 08/24/1955            DOA: 03/02/2022 JAS:[id number]             DOS: 03/03/2022, 11:29 AM   LOS: 0 days   Date of Service: The patient was seen and examined on 03/03/2022  Subjective:   The patient was seen and examined this morning. Hemodynamically stable, no complaints sitting up in chair awake alert oriented.  Reporting improved abdominal pain... Only have some mild headaches  Brief Narrative:    Samantha Mcfarland is a 66 y.o. female with medical history significant for breast cancer, anxiety, depression, hypertension.  Patient presented to the ED with complaints of 3-4 episodes of vomiting that started last night.  About 3-4 episodes of loose stools daily over the past 3-4 days.  Reports stools are loose, not watery or as severe as they were during recent hospitalizations. She denies abdominal pain.  Denies marijuana use.  She drinks about 3 beers daily, she has been cutting down on the amount she drinks.   Prior documentation states up to 20 beers per week. She reports some headaches.   Recent hospitalization 8/14 to 8/17, and 8/5 to 8/7 both for hyponatremia, with symptoms of nausea vomiting and diarrhea.  Lowest sodium down to 113.  Sodium was 126 on discharge, and 134 when she followed up with her outpatient provider..  Nephrology was consulted during last hospitalization, renal osmolality was consistent with SIADH, with elements of tea and toast diet.  Patient had been started on fluoxetine 2 weeks prior, recommended that she stop the fluoxetine.   She reports since discharge, she has stopped taking her antidepressant- Zoloft, though fluoxetine is what was mentioned in prior notes.  She also stop taking lisinopril, which was also discontinued during recent hospitalization   ED Course: Tmax 98.2.  Heart rate 59-69.  Respirate rate 11-16.  Blood pressure systolic 419. -  379.  Sodium 120.  1 L bolus given.  UA suggestive of UTI with positive leukocytes, IV ceftriaxone started and urine cultures ordered.   Hospitalist to admit for hyponatremia.    Assessment & Plan:   Principal Problem:   Hyponatremia Active Problems:   Nausea vomiting and diarrhea   Essential hypertension   History of left breast cancer   Alcohol use   COVID-19 virus infection     Assessment and Plan: * Hyponatremia  - Sodium level 120 >> 124  Past  2 recent hospitalizations for same, down to 113 in the recent past.  - Her SSRI was discontinued during recent hospitalization.  She reports she drinks about 3 beers daily, has been cutting down, ? beer potomania.  Vomiting may be secondary to her hyponatremia, or her GI loses may be causing hyponatremia. - Evaluated by nephrology, concern for SIADH, possible element of tea and toast diet. -1 L bolus given, continue IVF NS   Nausea vomiting and diarrhea Nausea vomiting diarrhea.  Possibly due to hyponatremia versus other GI causes.   - She denies marijuana use.   - Recent hospitalization for same, no abdominal imaging on file.  History of breast cancer diagnosed last year. -Obtain CT abdomen and  pelvis-reviewed multiple reviewed, findings suggestive of gastritis, 9 mm lesion left lobe liver-nonspecific, 7 mm cystic lesion in pancreatic uncinate process, appendicoliths, small hiatal hernia, aortic atherosclerosis  (Further imaging such as MRCP if recommended by oncologist)   -Continue IV hydration -Clear liquid diet, advance as tolerated    Essential hypertension - BP Remained stable   Low dose lisinopril discontinued during recent hospitalization.  COVID-19 virus infection -Only symptom or GI and headaches Results later came back positive for COVID.  Presenting with GI symptoms, denies respiratory symptoms. Not hypoxic.  -Paxlovid -Chest x-ray -clear  Alcohol use She is admitting to drinking- drinks 3 beers daily.   Reports she is cutting down.  Last drink at about 3 to 4 PM day before admission..  No history of alcohol withdrawal seizures, but reports history of "shakes" in the past when she drank much more. - CIWA PRN - Thiamine, folate, multivitamins  History of left breast cancer  Status post left mastectomy, chemotherapy.  Last chemo 07/2021.   - Follows with Dr. Delton Coombes --reconsulted -Oncology also consulted during recent hospitalization, did not feel her malignancy is related to her hyponatremia, plan was to consider further imaging if sodium did not improve. - She is on anastrozole, resume       ----------------------------------------------------------------------------------------------------------------------------------------------- Nutritional status:  The patient's BMI is: Body mass index is 19.37 kg/m. I agree with the assessment and plan as outlined below: Nutrition Status:    ----------------------------------------------------------------------------------------------------------------------------------------------  DVT prophylaxis:  enoxaparin (LOVENOX) injection 40 mg Start: 03/02/22 1845   Code Status:   Code Status: Full Code  Family Communication: No family member present at bedside- attempt will be made to update daily The above findings and plan of care has been discussed with patient (and family)  in detail,  they expressed understanding and agreement of above. -Advance care planning has been discussed.   Admission status:   Status is: Observation The patient remains OBS appropriate and will d/c before 2 midnights.     Procedures:   No admission procedures for hospital encounter.   Antimicrobials:  Anti-infectives (From admission, onward)    Start     Dose/Rate Route Frequency Ordered Stop   03/03/22 0000  nirmatrelvir/ritonavir EUA (PAXLOVID) 3 tablet        3 tablet Oral 2 times daily 03/02/22 2313 03/07/22 2159   03/02/22 1730  cefTRIAXone  (ROCEPHIN) 1 g in sodium chloride 0.9 % 100 mL IVPB        1 g 200 mL/hr over 30 Minutes Intravenous  Once 03/02/22 1718 03/02/22 1849        Medication:   anastrozole  1 mg Oral Daily   enoxaparin (LOVENOX) injection  40 mg Subcutaneous C94B   folic acid  1 mg Oral Daily   gabapentin  300 mg Oral BID   multivitamin with minerals  1 tablet Oral Daily   nirmatrelvir/ritonavir EUA  3 tablet Oral BID   sodium chloride  1 g Oral TID WC   thiamine  100 mg Oral Daily   Or   thiamine  100 mg Intravenous Daily    acetaminophen **OR** acetaminophen, HYDROcodone-acetaminophen, LORazepam **OR** LORazepam, ondansetron **OR** ondansetron (ZOFRAN) IV   Objective:   Vitals:   03/02/22 1705 03/02/22 1737 03/03/22 0017 03/03/22 0353  BP: (!) 140/95 (!) 153/83 113/63 101/73  Pulse: 68 71 (!) 56 (!) 56  Resp:  '20 18 18  '$ Temp: 97.7 F (36.5 C) 98.2 F (36.8 C)  97.7 F (36.5 C)  TempSrc: Oral  Oral  Oral  SpO2: 99% 99% 100% 99%  Weight:      Height:        Intake/Output Summary (Last 24 hours) at 03/03/2022 1129 Last data filed at 03/03/2022 0900 Gross per 24 hour  Intake 1557.08 ml  Output 400 ml  Net 1157.08 ml   Filed Weights   03/02/22 1136  Weight: 54.4 kg     Examination:   Physical Exam  Constitution:  Alert, cooperative, no distress,  Appears calm and comfortable  Psychiatric:   Normal and stable mood and affect, cognition intact,   HEENT:        Normocephalic, PERRL, otherwise with in Normal limits  Chest:         Chest symmetric Cardio vascular:  S1/S2, RRR, No murmure, No Rubs or Gallops  pulmonary: Clear to auscultation bilaterally, respirations unlabored, negative wheezes / crackles Abdomen: Soft, non-tender, non-distended, bowel sounds,no masses, no organomegaly Muscular skeletal: Limited exam - in bed, able to move all 4 extremities,   Neuro: CNII-XII intact. , normal motor and sensation, reflexes intact  Extremities: No pitting edema lower extremities, +2  pulses  Skin: Dry, warm to touch, negative for any Rashes, No open wounds Wounds: per nursing documentation   ------------------------------------------------------------------------------------------------------------------------------------------    LABs:     Latest Ref Rng & Units 03/03/2022    3:47 AM 03/02/2022   11:53 AM 02/09/2022    2:37 PM  CBC  WBC 4.0 - 10.5 K/uL 2.9  4.0  5.8   Hemoglobin 12.0 - 15.0 g/dL 11.2  12.9  10.3   Hematocrit 36.0 - 46.0 % 31.9  36.8  29.8   Platelets 150 - 400 K/uL 274  299  358       Latest Ref Rng & Units 03/03/2022    3:47 AM 03/02/2022   11:53 AM 02/09/2022    2:37 PM  CMP  Glucose 70 - 99 mg/dL 81  90  76   BUN 8 - 23 mg/dL '6  7  5   '$ Creatinine 0.44 - 1.00 mg/dL 0.52  0.53  0.65   Sodium 135 - 145 mmol/L 124  120  134   Potassium 3.5 - 5.1 mmol/L 3.3  4.5  4.8   Chloride 98 - 111 mmol/L 97  86  96   CO2 22 - 32 mmol/L '23  24  24   '$ Calcium 8.9 - 10.3 mg/dL 8.0  9.1  9.8   Total Protein 6.5 - 8.1 g/dL 5.8  7.3  6.6   Total Bilirubin 0.3 - 1.2 mg/dL 0.8  0.7  0.4   Alkaline Phos 38 - 126 U/L 51  63  48   AST 15 - 41 U/L 36  49  32   ALT 0 - 44 U/L 32  43  15        Micro Results Recent Results (from the past 240 hour(s))  SARS Coronavirus 2 by RT PCR (hospital order, performed in [place] hospital lab) *cepheid single result test* Anterior Nasal Swab     Status: Abnormal   Collection Time: 03/02/22  6:00 PM   Specimen: Anterior Nasal Swab  Result Value Ref Range Status   SARS Coronavirus 2 by RT PCR POSITIVE (A) NEGATIVE Final    Comment: (NOTE) SARS-CoV-2 target nucleic acids are DETECTED  SARS-CoV-2 RNA is generally detectable in upper respiratory specimens  during the acute phase of infection.  Positive results are indicative  of the presence of the identified virus, but  do not rule out bacterial infection or co-infection with other pathogens not detected by the test.  Clinical correlation with patient history and   other diagnostic information is necessary to determine patient infection status.  The expected result is negative.  Fact Sheet for Patients:   https://www.patel.info/   Fact Sheet for Healthcare Providers:   https://hall.com/    This test is not yet approved or cleared by the [place] FDA and  has been authorized for detection and/or diagnosis of SARS-CoV-2 by FDA under an Emergency Use Authorization (EUA).  This EUA will remain in effect (meaning this test can be used) for the duration of  the COVID-19 declaration under Section 564(b)(1)  of the Act, 21 U.S.C. section 360-bbb-3(b)(1), unless the authorization is terminated or revoked sooner.   Performed at [facility], [address] [place], [place] [address]     Radiology Reports DG CHEST PORT 1 VIEW  Result Date: 03/02/2022 CLINICAL DATA:  COVID positive.  Nausea, vomiting, and diarrhea. EXAM: PORTABLE CHEST 1 VIEW COMPARISON:  05/27/2021. FINDINGS: The heart size and mediastinal contours are within normal limits. There is atherosclerotic calcification of the aorta. No consolidation, effusion, or pneumothorax. Surgical clips are present in the left axilla. Cervical spinal fusion hardware is noted. No acute osseous abnormality. IMPRESSION: No active disease. Electronically Signed   By: Brett Fairy M.D.   On: 03/02/2022 23:57   CT ABDOMEN PELVIS W CONTRAST  Result Date: 03/02/2022 CLINICAL DATA:  Recurrent vomiting and diarrhea, current episode 2 days. Hyponatremia EXAM: CT ABDOMEN AND PELVIS WITH CONTRAST TECHNIQUE: Multidetector CT imaging of the abdomen and pelvis was performed using the standard protocol following bolus administration of intravenous contrast. RADIATION DOSE REDUCTION: This exam was performed according to the departmental dose-optimization program which includes automated exposure control, adjustment of the mA and/or kV according to patient size and/or use of  iterative reconstruction technique. CONTRAST:  69m OMNIPAQUE IOHEXOL 300 MG/ML  SOLN COMPARISON:  None Available. FINDINGS: Lower chest: Bibasilar atelectasis/scarring. Hepatobiliary: Enhancing 9 mm focus in the central left lobe of the liver on image 20/2. Gallbladder is unremarkable. No biliary ductal dilation. Pancreas: No pancreatic ductal dilation or evidence of acute inflammation. Possible small hypodense cystic lesion in the pancreatic uncinate process measuring 7 mm on image 75/2. Spleen: No splenomegaly or focal splenic lesion. Adrenals/Urinary Tract: Bilateral adrenal glands appear normal. No hydronephrosis. Kidneys demonstrate symmetric enhancement and excretion of contrast material. Urinary bladder is unremarkable for degree of distension. Stomach/Bowel: No radiopaque enteric contrast material was administered. Small hiatal hernia. Wall thickening versus underdistention of the gastric antrum. No pathologic dilation of small or large bowel. Appendicoliths measuring up to 5 mm in a nondistended noninflamed appendix. Terminal ileum appears normal. No evidence of acute inflammation of the large or small bowel. Vascular/Lymphatic: Aortic atherosclerosis. No pathologically enlarged abdominal or pelvic lymph nodes. Reproductive: Uterus and bilateral adnexa are unremarkable. Other: No significant abdominopelvic free fluid. Musculoskeletal: No acute osseous abnormality. Hemisacralization of the right L5 vertebral body. Multilevel degenerative changes spine. IMPRESSION: 1. Wall thickening versus underdistention of the gastric antrum, suggest correlation for gastritis. 2. Enhancing 9 mm focus in the central left lobe of the liver common nonspecific most commonly reflecting an intrahepatic shunt or hepatic hemangioma. Consider more definitive characterization by nonemergent hepatic protocol MRI with and without contrast preferably as an outpatient upon resolution of patient's symptomatology when they are better  able to follow commands including breath hold. 3. Possible 7 mm cystic lesion in the pancreatic uncinate  process, statistically likely to reflect a side branch IPMN. Attention on aforementioned hepatic protocol MRI suggested if performed. If that study is not performed would recommend follow up pre and post contrast MRI/MRCP in 1 year. This recommendation follows ACR consensus guidelines: Management of Incidental Pancreatic Cysts: A White Paper of the ACR Incidental Findings Committee. [place] 5427;06:[id number]. 4. Appendicoliths in a nondistended noninflamed appendix 5. Small hiatal hernia. 6.  Aortic Atherosclerosis (ICD10-I70.0). Electronically Signed   By: Dahlia Bailiff M.D.   On: 03/02/2022 17:35    SIGNED: Deatra James, MD, FHM. [facility],  Pager (please use [email or web address] to page/text) Please use Epic Secure Chat for non-urgent communication (7AM-7PM)  If 7PM-7AM, please contact night-coverage [email or web address], 03/03/2022, 11:29 AM

## 2022-03-03 NOTE — Hospital Course (Signed)
  Samantha Mcfarland is a 66 y.o. female with medical history significant for breast cancer, anxiety, depression, hypertension.  Patient presented to the ED with complaints of 3-4 episodes of vomiting that started last night.  About 3-4 episodes of loose stools daily over the past 3-4 days.  Reports stools are loose, not watery or as severe as they were during recent hospitalizations. She denies abdominal pain.  Denies marijuana use.  She drinks about 3 beers daily, she has been cutting down on the amount she drinks.   Prior documentation states up to 20 beers per week. She reports some headaches.   Recent hospitalization 8/14 to 8/17, and 8/5 to 8/7 both for hyponatremia, with symptoms of nausea vomiting and diarrhea.  Lowest sodium down to 113.  Sodium was 126 on discharge, and 134 when she followed up with her outpatient provider..  Nephrology was consulted during last hospitalization, renal osmolality was consistent with SIADH, with elements of tea and toast diet.  Patient had been started on fluoxetine 2 weeks prior, recommended that she stop the fluoxetine.   She reports since discharge, she has stopped taking her antidepressant- Zoloft, though fluoxetine is what was mentioned in prior notes.  She also stop taking lisinopril, which was also discontinued during recent hospitalization   ED Course: Tmax 98.2.  Heart rate 59-69.  Respirate rate 11-16.  Blood pressure systolic 660. - 630.  Sodium 120.  1 L bolus given.  UA suggestive of UTI with positive leukocytes, IV ceftriaxone started and urine cultures ordered.   Hospitalist to admit for hyponatremia.

## 2022-03-04 DIAGNOSIS — E871 Hypo-osmolality and hyponatremia: Secondary | ICD-10-CM | POA: Diagnosis not present

## 2022-03-04 LAB — COMPREHENSIVE METABOLIC PANEL
ALT: 27 U/L (ref 0–44)
AST: 27 U/L (ref 15–41)
Albumin: 3.5 g/dL (ref 3.5–5.0)
Alkaline Phosphatase: 45 U/L (ref 38–126)
Anion gap: 5 (ref 5–15)
BUN: <5 mg/dL — ABNORMAL LOW (ref 8–23)
CO2: 21 mmol/L — ABNORMAL LOW (ref 22–32)
Calcium: 8.2 mg/dL — ABNORMAL LOW (ref 8.9–10.3)
Chloride: 108 mmol/L (ref 98–111)
Creatinine, Ser: 0.49 mg/dL (ref 0.44–1.00)
GFR, Estimated: >60 mL/min (ref >60)
Glucose, Bld: 73 mg/dL (ref 70–99)
Potassium: 3.1 mmol/L — ABNORMAL LOW (ref 3.5–5.1)
Sodium: 134 mmol/L — ABNORMAL LOW (ref 135–145)
Total Bilirubin: 0.7 mg/dL (ref 0.3–1.2)
Total Protein: 5.7 g/dL — ABNORMAL LOW (ref 6.5–8.1)

## 2022-03-04 LAB — GASTROINTESTINAL PANEL BY PCR, STOOL (REPLACES STOOL CULTURE)

## 2022-03-04 LAB — CBC WITH DIFFERENTIAL/PLATELET
Abs Immature Granulocytes: 0 10*3/uL (ref 0.00–0.07)
Basophils Absolute: 0 10*3/uL (ref 0.0–0.1)
Basophils Relative: 0 %
Eosinophils Absolute: 0.1 10*3/uL (ref 0.0–0.5)
Eosinophils Relative: 4 %
HCT: 30 % — ABNORMAL LOW (ref 36.0–46.0)
Hemoglobin: 10.4 g/dL — ABNORMAL LOW (ref 12.0–15.0)
Lymphocytes Relative: 42 %
Lymphs Abs: 1.1 10*3/uL (ref 0.7–4.0)
MCH: 34.8 pg — ABNORMAL HIGH (ref 26.0–34.0)
MCHC: 34.7 g/dL (ref 30.0–36.0)
MCV: 100.3 fL — ABNORMAL HIGH (ref 80.0–100.0)
Monocytes Absolute: 0.4 10*3/uL (ref 0.1–1.0)
Monocytes Relative: 15 %
Neutro Abs: 1 10*3/uL — ABNORMAL LOW (ref 1.7–7.7)
Neutrophils Relative %: 39 %
Platelets: 258 10*3/uL (ref 150–400)
RBC: 2.99 MIL/uL — ABNORMAL LOW (ref 3.87–5.11)
RDW: 14 % (ref 11.5–15.5)
WBC: 2.6 10*3/uL — ABNORMAL LOW (ref 4.0–10.5)
nRBC: 0 % (ref 0.0–0.2)

## 2022-03-04 LAB — FERRITIN: Ferritin: 106 ng/mL (ref 11–307)

## 2022-03-04 LAB — D-DIMER, QUANTITATIVE: D-Dimer, Quant: <0.27 ug/mL-FEU (ref 0.00–0.50)

## 2022-03-04 LAB — C-REACTIVE PROTEIN: CRP: 0.5 mg/dL (ref <1.0)

## 2022-03-04 LAB — MAGNESIUM: Magnesium: 1.8 mg/dL (ref 1.7–2.4)

## 2022-03-04 LAB — PHOSPHORUS: Phosphorus: 3.2 mg/dL (ref 2.5–4.6)

## 2022-03-04 MED ORDER — TRAZODONE HCL 50 MG PO TABS
50.0000 mg | ORAL_TABLET | Freq: Every evening | ORAL | Status: DC | PRN
  Administered 2022-03-05: 50 mg via ORAL
  Filled 2022-03-04: qty 1

## 2022-03-04 MED ORDER — POTASSIUM CHLORIDE CRYS ER 20 MEQ PO TBCR
40.0000 meq | EXTENDED_RELEASE_TABLET | Freq: Once | ORAL | Status: AC
  Administered 2022-03-04: 40 meq via ORAL
  Filled 2022-03-04: qty 2

## 2022-03-04 MED ORDER — SODIUM CHLORIDE 0.9 % IV SOLN: INTRAVENOUS | Status: AC

## 2022-03-04 MED ORDER — FLORANEX PO PACK: 1.0000 g | PACK | Freq: Three times a day (TID) | ORAL | Status: DC

## 2022-03-04 MED ORDER — CIPROFLOXACIN HCL 250 MG PO TABS
500.0000 mg | ORAL_TABLET | Freq: Two times a day (BID) | ORAL | Status: DC
  Administered 2022-03-04 – 2022-03-05 (×3): 500 mg via ORAL
  Filled 2022-03-04 (×3): qty 2

## 2022-03-04 MED ORDER — MAGNESIUM SULFATE 2 GM/50ML IV SOLN
2.0000 g | Freq: Once | INTRAVENOUS | Status: AC
  Administered 2022-03-04: 2 g via INTRAVENOUS
  Filled 2022-03-04: qty 50

## 2022-03-04 NOTE — Assessment & Plan Note (Signed)
-   Monitoring and repleting orally 

## 2022-03-04 NOTE — Progress Notes (Signed)
PROGRESS NOTE    Patient: Samantha Mcfarland                            PCP: Baruch Gouty, FNP                    DOB: [birth year]            DOA: 03/02/2022 SWF:[id number]             DOS: 03/04/2022, 12:24 PM   LOS: 1 day   Date of Service: The patient was seen and examined on 03/04/2022  Subjective:   The patient was seen and examined this morning, stable no acute distress, complaining of multiple episodes of watery diarrhea overnight... Also complaining of headaches..  Otherwise hemodynamically stable   Abdominal CT findings were discussed with the patient in detail and emphasized on close follow-up with Dr. Delton Coombes   Brief Narrative:    Lance Galas is a 66 y.o. female with medical history significant for breast cancer, anxiety, depression, hypertension.  Patient presented to the ED with complaints of 3-4 episodes of vomiting that started last night.  About 3-4 episodes of loose stools daily over the past 3-4 days.  Reports stools are loose, not watery or as severe as they were during recent hospitalizations. She denies abdominal pain.  Denies marijuana use.  She drinks about 3 beers daily, she has been cutting down on the amount she drinks.   Prior documentation states up to 20 beers per week. She reports some headaches.   Recent hospitalization 8/14 to 8/17, and 8/5 to 8/7 both for hyponatremia, with symptoms of nausea vomiting and diarrhea.  Lowest sodium down to 113.  Sodium was 126 on discharge, and 134 when she followed up with her outpatient provider..  Nephrology was consulted during last hospitalization, renal osmolality was consistent with SIADH, with elements of tea and toast diet.  Patient had been started on fluoxetine 2 weeks prior, recommended that she stop the fluoxetine.   She reports since discharge, she has stopped taking her antidepressant- Zoloft, though fluoxetine is what was mentioned in prior notes.  She also stop taking lisinopril, which was also discontinued  during recent hospitalization   ED Course: Tmax 98.2.  Heart rate 59-69.  Respirate rate 11-16.  Blood pressure systolic 220. - 254.  Sodium 120.  1 L bolus given.  UA suggestive of UTI with positive leukocytes, IV ceftriaxone started and urine cultures ordered.   Hospitalist to admit for hyponatremia.    Assessment & Plan:   Principal Problem:   Hyponatremia Active Problems:   Nausea vomiting and diarrhea   Essential hypertension   History of left breast cancer   Hypokalemia   Alcohol use   COVID-19 virus infection     Assessment and Plan: * Hyponatremia  - Sodium level 120 >> 124 >>> 134  Past  2 recent hospitalizations for same, down to 113 in the recent past.  - Her SSRI was discontinued during recent hospitalization.  She reports she drinks about 3 beers daily, has been cutting down, ? beer potomania.  Vomiting may be secondary to her hyponatremia, or her GI loses may be causing hyponatremia. - Evaluated by nephrology, concern for SIADH, possible element of tea and toast diet. -1 L bolus given, continue IVF NS   Nausea vomiting and diarrhea Nausea vomiting has improved -Stool pathogen positive for E. Coli -Starting p.o. ciprofloxacin today 03/04/2022 along with lactobacillus   -  Recent hospitalization for same, no abdominal imaging on file.   - History of breast cancer diagnosed last year. -Obtain CT abdomen and pelvis-reviewed multiple reviewed, findings suggestive of gastritis, 9 mm lesion left lobe liver-nonspecific, 7 mm cystic lesion in pancreatic uncinate process, appendicoliths, small hiatal hernia, aortic atherosclerosis  (Further imaging such as MRCP will be deferred to patient's oncologist Dr. Delton Coombes -follow-up as an outpatient)   -Continue IV hydration -Clear liquid diet, advance as tolerated    Essential hypertension - BP Remained stable   Low dose lisinopril discontinued during recent hospitalization.  COVID-19 virus infection -Remained  asymptomatic with exception of headaches and diarrhea -No upper respiratory symptoms -SARS-CoV-2 positive .  -Paxlovid -Chest x-ray -clear  Alcohol use -No signs of withdrawals, seizures or tremors at this point She is admitting to drinking- drinks 3 beers daily.  Reports she is cutting down.  L - No history of alcohol withdrawal seizures, but reports history of "shakes" in the past when she drank much more. - CIWA PRN - Thiamine, folate, multivitamins  Hypokalemia - Monitoring and repleting orally  History of left breast cancer  Status post left mastectomy, chemotherapy.   Last chemo 07/2021.   - Follows with Dr. Delton Coombes --reconsulted but [place] is [place] of the [place] 2 weeks -CT finding concerning for new liver lesions--- referral to [facility] for further imaging and evaluation as an outpatient. - She is on anastrozole, resume       ----------------------------------------------------------------------------------------------------------------------------------------------- Nutritional status:  The patient's BMI is: Body mass index is 19.37 kg/m. I agree with the assessment and plan as outlined below: Nutrition Status:    ----------------------------------------------------------------------------------------------------------------------------------------------  DVT prophylaxis:  enoxaparin (LOVENOX) injection 40 mg Start: 03/02/22 1845   Code Status:   Code Status: Full Code  Family Communication: No family member present at bedside- attempt will be made to update daily The above findings and plan of care has been discussed with patient (and family)  in detail,  they expressed understanding and agreement of above. -Advance care planning has been discussed.   Admission status:   Status is: Observation The patient remains OBS appropriate and will d/c before 2 midnights.     Procedures:   No admission procedures for hospital encounter.   Antimicrobials:   Anti-infectives (From admission, onward)    Start     Dose/Rate Route Frequency Ordered Stop   03/04/22 1315  ciprofloxacin (CIPRO) tablet 500 mg        500 mg Oral 2 times daily 03/04/22 1217     03/03/22 0000  nirmatrelvir/ritonavir EUA (PAXLOVID) 3 tablet        3 tablet Oral 2 times daily 03/02/22 2313 03/07/22 2159   03/02/22 1730  cefTRIAXone (ROCEPHIN) 1 g in sodium chloride 0.9 % 100 mL IVPB        1 g 200 mL/hr over 30 Minutes Intravenous  Once 03/02/22 1718 03/02/22 1849        Medication:   acidophilus  1 capsule Oral TID WC   anastrozole  1 mg Oral Daily   ciprofloxacin  500 mg Oral BID   enoxaparin (LOVENOX) injection  40 mg Subcutaneous Q76A   folic acid  1 mg Oral Daily   gabapentin  300 mg Oral BID   lactobacillus  1 g Oral TID WC   multivitamin with minerals  1 tablet Oral Daily   nirmatrelvir/ritonavir EUA  3 tablet Oral BID   sodium chloride  1 g Oral TID WC   thiamine  100  mg Oral Daily   Or   thiamine  100 mg Intravenous Daily    acetaminophen **OR** acetaminophen, HYDROcodone-acetaminophen, loperamide, LORazepam **OR** LORazepam, ondansetron **OR** ondansetron (ZOFRAN) IV   Objective:   Vitals:   03/03/22 1658 03/03/22 2105 03/04/22 0616 03/04/22 1142  BP: 129/75 119/69 125/85 127/76  Pulse: 61 (!) 54 78 (!) 57  Resp: '16 18 17 20  '$ Temp: 98.2 F (36.8 C) 97.6 F (36.4 C) 97.7 F (36.5 C) 98.3 F (36.8 C)  TempSrc: Oral     SpO2: 100% 99% 99% 99%  Weight:      Height:        Intake/Output Summary (Last 24 hours) at 03/04/2022 1224 Last data filed at 03/04/2022 0900 Gross per 24 hour  Intake 1680 ml  Output 500 ml  Net 1180 ml   Filed Weights   03/02/22 1136  Weight: 54.4 kg     Examination:     General:  AAO x 3,  cooperative, no distress;   HEENT:  Normocephalic, PERRL, otherwise with in Normal limits   Neuro:  CNII-XII intact. , normal motor and sensation, reflexes intact   Lungs:   Clear to auscultation BL, Respirations  unlabored,  No wheezes / crackles  Cardio:    S1/S2, RRR, No murmure, No Rubs or Gallops   Abdomen:  Soft, non-tender, bowel sounds active all four quadrants, no guarding or peritoneal signs.  Muscular  skeletal:  Limited exam -global generalized weaknesses - in bed, able to move all 4 extremities,   2+ pulses,  symmetric, No pitting edema  Skin:  Dry, warm to touch, negative for any Rashes,  Wounds: Please see nursing documentation    ---------------------------------------------------------------------------------------------------------------------------------------    LABs:     Latest Ref Rng & Units 03/04/2022    3:47 AM 03/03/2022    3:47 AM 03/02/2022   11:53 AM  CBC  WBC 4.0 - 10.5 K/uL 2.6  2.9  4.0   Hemoglobin 12.0 - 15.0 g/dL 10.4  11.2  12.9   Hematocrit 36.0 - 46.0 % 30.0  31.9  36.8   Platelets 150 - 400 K/uL 258  274  299       Latest Ref Rng & Units 03/04/2022    3:47 AM 03/03/2022    3:47 AM 03/02/2022   11:53 AM  CMP  Glucose 70 - 99 mg/dL 73  81  90   BUN 8 - 23 mg/dL '5  6  7   '$ Creatinine 0.44 - 1.00 mg/dL 0.49  0.52  0.53   Sodium 135 - 145 mmol/L 134  124  120   Potassium 3.5 - 5.1 mmol/L 3.1  3.3  4.5   Chloride 98 - 111 mmol/L 108  97  86   CO2 22 - 32 mmol/L '21  23  24   '$ Calcium 8.9 - 10.3 mg/dL 8.2  8.0  9.1   Total Protein 6.5 - 8.1 g/dL 5.7  5.8  7.3   Total Bilirubin 0.3 - 1.2 mg/dL 0.7  0.8  0.7   Alkaline Phos 38 - 126 U/L 45  51  63   AST 15 - 41 U/L 27  36  49   ALT 0 - 44 U/L 27  32  43        Micro Results Recent Results (from the past 240 hour(s))  Gastrointestinal Panel by PCR , Stool     Status: Abnormal   Collection Time: 03/02/22  3:32 PM   Specimen: Stool  Result Value Ref Range Status   Campylobacter species NOT DETECTED NOT DETECTED Final   Plesimonas shigelloides NOT DETECTED NOT DETECTED Final   Salmonella species NOT DETECTED NOT DETECTED Final   Yersinia enterocolitica NOT DETECTED NOT DETECTED Final   Vibrio  species NOT DETECTED NOT DETECTED Final   Vibrio cholerae NOT DETECTED NOT DETECTED Final   Enteroaggregative E coli (EAEC) NOT DETECTED NOT DETECTED Final   Enteropathogenic E coli (EPEC) DETECTED (A) NOT DETECTED Final    Comment: RESULT CALLED TO, READ BACK BY AND VERIFIED WITH: Caryl Asp RN 1201 03/04/22 HNM    Enterotoxigenic E coli (ETEC) NOT DETECTED NOT DETECTED Final   Shiga like toxin producing E coli (STEC) NOT DETECTED NOT DETECTED Final   Shigella/Enteroinvasive E coli (EIEC) NOT DETECTED NOT DETECTED Final   Cryptosporidium NOT DETECTED NOT DETECTED Final   Cyclospora cayetanensis NOT DETECTED NOT DETECTED Final   Entamoeba histolytica NOT DETECTED NOT DETECTED Final   Giardia lamblia NOT DETECTED NOT DETECTED Final   Adenovirus F40/41 NOT DETECTED NOT DETECTED Final   Astrovirus NOT DETECTED NOT DETECTED Final   Norovirus GI/GII NOT DETECTED NOT DETECTED Final   Rotavirus A NOT DETECTED NOT DETECTED Final   Sapovirus (I, II, IV, and V) NOT DETECTED NOT DETECTED Final    Comment: Performed at [facility], [address] [place], [place] [address]  Urine Culture     Status: Abnormal   Collection Time: 03/02/22  4:38 PM   Specimen: Urine, Clean Catch  Result Value Ref Range Status   Specimen Description   Final    URINE, CLEAN CATCH Performed at [facility], [address], [place], [place] [address]    Special Requests   Final    NONE Performed at [facility], [address], [place], [place] [address]    Culture (A)  Final    <10,000 COLONIES/mL INSIGNIFICANT GROWTH Performed at [facility] [address], [place], [place] [address]    Report Status 03/03/2022 FINAL  Final  SARS Coronavirus 2 by RT PCR (hospital order, performed in [facility] hospital lab) *cepheid single result test* Anterior Nasal Swab     Status: Abnormal   Collection Time: 03/02/22  6:00 PM   Specimen: Anterior Nasal Swab  Result Value Ref Range Status   SARS  Coronavirus 2 by RT PCR POSITIVE (A) NEGATIVE Final    Comment: (NOTE) SARS-CoV-2 target nucleic acids are DETECTED  SARS-CoV-2 RNA is generally detectable in upper respiratory specimens  during the acute phase of infection.  Positive results are indicative  of the presence of the identified virus, but do not rule out bacterial infection or co-infection with other pathogens not detected by the test.  Clinical correlation with patient history and  other diagnostic information is necessary to determine patient infection status.  The expected result is negative.  Fact Sheet for Patients:   https://www.patel.info/   Fact Sheet for Healthcare Providers:   https://hall.com/    This test is not yet approved or cleared by the [place] FDA and  has been authorized for detection and/or diagnosis of SARS-CoV-2 by FDA under an Emergency Use Authorization (EUA).  This EUA will remain in effect (meaning this test can be used) for the duration of  the COVID-19 declaration under Section 564(b)(1)  of the Act, 21 U.S.C. section 360-bbb-3(b)(1), unless the authorization is terminated or revoked sooner.   Performed at [facility], [address] [place], [place] [address]  Radiology Reports No results found.  SIGNED: Deatra James, MD, FHM. [facility],  Pager (please use [email or web address] to page/text) Please use Epic Secure Chat for non-urgent communication (7AM-7PM)  If 7PM-7AM, please contact night-coverage [email or web address], 03/04/2022, 12:24 PM

## 2022-03-05 LAB — COMPREHENSIVE METABOLIC PANEL
ALT: 27 U/L (ref 0–44)
AST: 27 U/L (ref 15–41)
Albumin: 4 g/dL (ref 3.5–5.0)
Alkaline Phosphatase: 52 U/L (ref 38–126)
Anion gap: 4 — ABNORMAL LOW (ref 5–15)
BUN: <5 mg/dL — ABNORMAL LOW (ref 8–23)
CO2: 24 mmol/L (ref 22–32)
Calcium: 8.8 mg/dL — ABNORMAL LOW (ref 8.9–10.3)
Chloride: 107 mmol/L (ref 98–111)
Creatinine, Ser: 0.48 mg/dL (ref 0.44–1.00)
GFR, Estimated: >60 mL/min (ref >60)
Glucose, Bld: 75 mg/dL (ref 70–99)
Potassium: 3.7 mmol/L (ref 3.5–5.1)
Sodium: 135 mmol/L (ref 135–145)
Total Bilirubin: 0.6 mg/dL (ref 0.3–1.2)
Total Protein: 6.5 g/dL (ref 6.5–8.1)

## 2022-03-05 LAB — CBC WITH DIFFERENTIAL/PLATELET
Abs Immature Granulocytes: 0.01 10*3/uL (ref 0.00–0.07)
Basophils Absolute: 0 10*3/uL (ref 0.0–0.1)
Basophils Relative: 1 %
Eosinophils Absolute: 0.2 10*3/uL (ref 0.0–0.5)
Eosinophils Relative: 8 %
HCT: 34.2 % — ABNORMAL LOW (ref 36.0–46.0)
Hemoglobin: 11.8 g/dL — ABNORMAL LOW (ref 12.0–15.0)
Immature Granulocytes: 0 %
Lymphocytes Relative: 54 %
Lymphs Abs: 1.6 10*3/uL (ref 0.7–4.0)
MCH: 34.9 pg — ABNORMAL HIGH (ref 26.0–34.0)
MCHC: 34.5 g/dL (ref 30.0–36.0)
MCV: 101.2 fL — ABNORMAL HIGH (ref 80.0–100.0)
Monocytes Absolute: 0.5 10*3/uL (ref 0.1–1.0)
Monocytes Relative: 15 %
Neutro Abs: 0.7 10*3/uL — ABNORMAL LOW (ref 1.7–7.7)
Neutrophils Relative %: 22 %
Platelets: 247 10*3/uL (ref 150–400)
RBC: 3.38 MIL/uL — ABNORMAL LOW (ref 3.87–5.11)
RDW: 14.3 % (ref 11.5–15.5)
WBC: 3 10*3/uL — ABNORMAL LOW (ref 4.0–10.5)
nRBC: 0 % (ref 0.0–0.2)

## 2022-03-05 LAB — MAGNESIUM: Magnesium: 2.1 mg/dL (ref 1.7–2.4)

## 2022-03-05 LAB — FERRITIN: Ferritin: 117 ng/mL (ref 11–307)

## 2022-03-05 LAB — C-REACTIVE PROTEIN: CRP: <0.5 mg/dL (ref <1.0)

## 2022-03-05 LAB — D-DIMER, QUANTITATIVE: D-Dimer, Quant: <0.27 ug/mL-FEU (ref 0.00–0.50)

## 2022-03-05 LAB — PHOSPHORUS: Phosphorus: 2.6 mg/dL (ref 2.5–4.6)

## 2022-03-05 MED ORDER — CIPROFLOXACIN HCL 500 MG PO TABS: 500.0000 mg | ORAL_TABLET | Freq: Two times a day (BID) | ORAL | 0 refills | Status: AC

## 2022-03-05 MED ORDER — NIRMATRELVIR/RITONAVIR (PAXLOVID)TABLET: 3.0000 | ORAL_TABLET | Freq: Two times a day (BID) | ORAL | 0 refills | Status: AC

## 2022-03-05 MED ORDER — HYDROCODONE-ACETAMINOPHEN 5-325 MG PO TABS: 1.0000 | ORAL_TABLET | Freq: Four times a day (QID) | ORAL | 0 refills | Status: AC | PRN

## 2022-03-05 MED ORDER — LOPERAMIDE HCL 2 MG PO CAPS: 2.0000 mg | ORAL_CAPSULE | ORAL | 0 refills | Status: AC | PRN

## 2022-03-05 MED ORDER — FOLIC ACID 1 MG PO TABS: 1.0000 mg | ORAL_TABLET | Freq: Every day | ORAL | 0 refills | Status: AC

## 2022-03-05 MED ORDER — RISAQUAD PO CAPS: 1.0000 | ORAL_CAPSULE | Freq: Three times a day (TID) | ORAL | 0 refills | Status: AC

## 2022-03-05 MED ORDER — HYDROCODONE-ACETAMINOPHEN 5-325 MG PO TABS: 1.0000 | ORAL_TABLET | Freq: Four times a day (QID) | ORAL | 0 refills | Status: DC | PRN

## 2022-03-05 NOTE — Care Management Important Message (Signed)
Important Message  Patient Details  Name: Samantha Mcfarland MRN: [id number] Date of Birth: Jun 13, 1956   Medicare Important Message Given:  Yes (spoke with patient at [phone], no additional copy needed)     Tommy Medal 03/05/2022, 10:52 AM

## 2022-03-07 NOTE — Discharge Summary (Signed)
Physician Discharge Summary   Patient: Samantha Mcfarland MRN: [id number] DOB: 1956/06/05  Admit date:     03/02/2022  Discharge date: 03/05/2022  Discharge Physician: Deatra James   PCP: Baruch Gouty, FNP   Recommendations at discharge:   Follow-up with PCP in 1-2 weeks Follow-up with Dr. Delton Coombes in 2-4 weeks, specifically with new finding on the CT scan likely need reimaging and follow-up closely Avoid alcohol and alcohol products  Discharge Diagnoses: Principal Problem:   Hyponatremia Active Problems:   Nausea vomiting and diarrhea   Essential hypertension   History of left breast cancer   Hypokalemia   Alcohol use   COVID-19 virus infection  Resolved Problems:   * No resolved hospital problems. *  Hospital Course:  Samantha Mcfarland is a 66 y.o. female with medical history significant for breast cancer, anxiety, depression, hypertension.  Patient presented to the ED with complaints of 3-4 episodes of vomiting that started last night.  About 3-4 episodes of loose stools daily over the past 3-4 days.  Reports stools are loose, not watery or as severe as they were during recent hospitalizations. She denies abdominal pain.  Denies marijuana use.  She drinks about 3 beers daily, she has been cutting down on the amount she drinks.   Prior documentation states up to 20 beers per week. She reports some headaches.   Recent hospitalization 8/14 to 8/17, and 8/5 to 8/7 both for hyponatremia, with symptoms of nausea vomiting and diarrhea.  Lowest sodium down to 113.  Sodium was 126 on discharge, and 134 when she followed up with her outpatient provider..  Nephrology was consulted during last hospitalization, renal osmolality was consistent with SIADH, with elements of tea and toast diet.  Patient had been started on fluoxetine 2 weeks prior, recommended that she stop the fluoxetine.   She reports since discharge, she has stopped taking her antidepressant- Zoloft, though fluoxetine is what  was mentioned in prior notes.  She also stop taking lisinopril, which was also discontinued during recent hospitalization   ED Course: Tmax 98.2.  Heart rate 59-69.  Respirate rate 11-16.  Blood pressure systolic 073. - 710.  Sodium 120.  1 L bolus given.  UA suggestive of UTI with positive leukocytes, IV ceftriaxone started and urine cultures ordered.   Hospitalist to admit for hyponatremia.  Assessment and Plan: * Hyponatremia  - Sodium level 120 >> 124 >>> 134  Past  2 recent hospitalizations for same, down to 113 in the recent past.  - Her SSRI was discontinued during recent hospitalization.  She reports she drinks about 3 beers daily, has been cutting down, ? beer potomania.  Vomiting may be secondary to her hyponatremia, or her GI loses may be causing hyponatremia. - Evaluated by nephrology, concern for SIADH, possible element of tea and toast diet. -1 L bolus given, continue IVF NS   Nausea vomiting and diarrhea Nausea vomiting has improved -Stool pathogen positive for E. Coli -Starting p.o. ciprofloxacin today 03/04/2022 along with lactobacillus   - Recent hospitalization for same, no abdominal imaging on file.   - History of breast cancer diagnosed last year. -Obtain CT abdomen and pelvis-reviewed multiple reviewed, findings suggestive of gastritis, 9 mm lesion left lobe liver-nonspecific, 7 mm cystic lesion in pancreatic uncinate process, appendicoliths, small hiatal hernia, aortic atherosclerosis  (Further imaging such as MRCP will be deferred to patient's oncologist Dr. Delton Coombes -follow-up as an outpatient)   -Continue IV hydration -Clear liquid diet, advance as tolerated  Essential hypertension - BP Remained stable   Low dose lisinopril discontinued during recent hospitalization.  COVID-19 virus infection -Remained asymptomatic with exception of headaches and diarrhea -No upper respiratory symptoms -SARS-CoV-2 positive .  -Paxlovid -Chest x-ray  -clear  Alcohol use -No signs of withdrawals, seizures or tremors at this point She is admitting to drinking- drinks 3 beers daily.  Reports she is cutting down.  L - No history of alcohol withdrawal seizures, but reports history of "shakes" in the past when she drank much more. - CIWA PRN - Thiamine, folate, multivitamins  Hypokalemia - Monitoring and repleting orally  History of left breast cancer  Status post left mastectomy, chemotherapy.   Last chemo 07/2021.   - Follows with Dr. Delton Coombes --reconsulted but he is [place] of the [place] 2 weeks -CT finding concerning for new liver lesions--- referral to [facility] for further imaging and evaluation as an outpatient. - She is on anastrozole, resume   Disposition: Home Diet recommendation:  Discharge Diet Orders (From admission, onward)     Start     Ordered   03/05/22 0000  Diet - low sodium heart healthy        03/05/22 1024           Cardiac diet DISCHARGE MEDICATION: Allergies as of 03/05/2022       Reactions   Ibuprofen Other (See Comments)   "Stomach upset"   Penicillins Rash        Medication List     STOP taking these medications    omeprazole 20 MG capsule Commonly known as: PRILOSEC   predniSONE 10 MG tablet Commonly known as: DELTASONE   traMADol 50 MG tablet Commonly known as: Ultram       TAKE these medications    acetaminophen 325 MG tablet Commonly known as: TYLENOL Take 325 mg by mouth every 6 (six) hours as needed for moderate pain or headache.   acidophilus Caps capsule Take 1 capsule by mouth 3 (three) times daily with meals for 10 days.   ALLERGY MEDICATION PO Take 1 tablet by mouth as needed (allergies).   anastrozole 1 MG tablet Commonly known as: ARIMIDEX Take 1 tablet (1 mg total) by mouth daily.   atorvastatin 20 MG tablet Commonly known as: LIPITOR Take 1 tablet (20 mg total) by mouth daily. (NEEDS TO BE SEEN BEFORE NEXT REFILL)   cholecalciferol 25 MCG (1000  UNIT) tablet Commonly known as: VITAMIN D3 Take 1,000 Units by mouth daily.   ciprofloxacin 500 MG tablet Commonly known as: CIPRO Take 1 tablet (500 mg total) by mouth 2 (two) times daily for 6 days.   cyanocobalamin 500 MCG tablet Commonly known as: VITAMIN B12 Take 500 mcg by mouth daily.   folic acid 1 MG tablet Commonly known as: FOLVITE Take 1 tablet (1 mg total) by mouth daily for 5 days.   gabapentin 300 MG capsule Commonly known as: NEURONTIN TAKE 1 CAPSULE 2 TIMES A DAY What changed: See the new instructions.   HYDROcodone-acetaminophen 5-325 MG tablet Commonly known as: NORCO/VICODIN Take 1 tablet by mouth every 6 (six) hours as needed for up to 3 days for moderate pain.   loperamide 2 MG capsule Commonly known as: IMODIUM Take 1 capsule (2 mg total) by mouth as needed for diarrhea or loose stools.   nirmatrelvir/ritonavir EUA 20 x 150 MG & 10 x '100MG'$  Tabs Commonly known as: PAXLOVID Take 3 tablets by mouth 2 (two) times daily for 3 days. Patient GFR is >  60 Take nirmatrelvir (150 mg) two tablets twice daily for 5 days and ritonavir (100 mg) one tablet twice daily for 5 days.   ondansetron 4 MG tablet Commonly known as: Zofran Take 1 tablet (4 mg total) by mouth every 8 (eight) hours as needed for nausea or vomiting.        Discharge Exam: Filed Weights   03/02/22 1136  Weight: 54.4 kg      Physical Exam:   General:  AAO x 3,  cooperative, no distress;   HEENT:  Normocephalic, PERRL, otherwise with in Normal limits   Neuro:  CNII-XII intact. , normal motor and sensation, reflexes intact   Lungs:   Clear to auscultation BL, Respirations unlabored,  No wheezes / crackles  Cardio:    S1/S2, RRR, No murmure, No Rubs or Gallops   Abdomen:  Soft, non-tender, bowel sounds active all four quadrants, no guarding or peritoneal signs.  Muscular  skeletal:  Limited exam -global generalized weaknesses - in bed, able to move all 4 extremities,   2+ pulses,   symmetric, No pitting edema  Skin:  Dry, warm to touch, negative for any Rashes,  Wounds: Please see nursing documentation          Condition at discharge: fair  The results of significant diagnostics from this hospitalization (including imaging, microbiology, ancillary and laboratory) are listed below for reference.   Imaging Studies: DG CHEST PORT 1 VIEW  Result Date: 03/02/2022 CLINICAL DATA:  COVID positive.  Nausea, vomiting, and diarrhea. EXAM: PORTABLE CHEST 1 VIEW COMPARISON:  05/27/2021. FINDINGS: The heart size and mediastinal contours are within normal limits. There is atherosclerotic calcification of the aorta. No consolidation, effusion, or pneumothorax. Surgical clips are present in the left axilla. Cervical spinal fusion hardware is noted. No acute osseous abnormality. IMPRESSION: No active disease. Electronically Signed   By: Brett Fairy M.D.   On: 03/02/2022 23:57   CT ABDOMEN PELVIS W CONTRAST  Result Date: 03/02/2022 CLINICAL DATA:  Recurrent vomiting and diarrhea, current episode 2 days. Hyponatremia EXAM: CT ABDOMEN AND PELVIS WITH CONTRAST TECHNIQUE: Multidetector CT imaging of the abdomen and pelvis was performed using the standard protocol following bolus administration of intravenous contrast. RADIATION DOSE REDUCTION: This exam was performed according to the departmental dose-optimization program which includes automated exposure control, adjustment of the mA and/or kV according to patient size and/or use of iterative reconstruction technique. CONTRAST:  30m OMNIPAQUE IOHEXOL 300 MG/ML  SOLN COMPARISON:  None Available. FINDINGS: Lower chest: Bibasilar atelectasis/scarring. Hepatobiliary: Enhancing 9 mm focus in the central left lobe of the liver on image 20/2. Gallbladder is unremarkable. No biliary ductal dilation. Pancreas: No pancreatic ductal dilation or evidence of acute inflammation. Possible small hypodense cystic lesion in the pancreatic uncinate process  measuring 7 mm on image 75/2. Spleen: No splenomegaly or focal splenic lesion. Adrenals/Urinary Tract: Bilateral adrenal glands appear normal. No hydronephrosis. Kidneys demonstrate symmetric enhancement and excretion of contrast material. Urinary bladder is unremarkable for degree of distension. Stomach/Bowel: No radiopaque enteric contrast material was administered. Small hiatal hernia. Wall thickening versus underdistention of the gastric antrum. No pathologic dilation of small or large bowel. Appendicoliths measuring up to 5 mm in a nondistended noninflamed appendix. Terminal ileum appears normal. No evidence of acute inflammation of the large or small bowel. Vascular/Lymphatic: Aortic atherosclerosis. No pathologically enlarged abdominal or pelvic lymph nodes. Reproductive: Uterus and bilateral adnexa are unremarkable. Other: No significant abdominopelvic free fluid. Musculoskeletal: No acute osseous abnormality. Hemisacralization of the right L5  vertebral body. Multilevel degenerative changes spine. IMPRESSION: 1. Wall thickening versus underdistention of the gastric antrum, suggest correlation for gastritis. 2. Enhancing 9 mm focus in the central left lobe of the liver common nonspecific most commonly reflecting an intrahepatic shunt or hepatic hemangioma. Consider more definitive characterization by nonemergent hepatic protocol MRI with and without contrast preferably as an outpatient upon resolution of patient's symptomatology when they are better able to follow commands including breath hold. 3. Possible 7 mm cystic lesion in the pancreatic uncinate process, statistically likely to reflect a side branch IPMN. Attention on aforementioned hepatic protocol MRI suggested if performed. If that study is not performed would recommend follow up pre and post contrast MRI/MRCP in 1 year. This recommendation follows ACR consensus guidelines: Management of Incidental Pancreatic Cysts: A White Paper of the ACR  Incidental Findings Committee. [place] 0932;67:[id number]. 4. Appendicoliths in a nondistended noninflamed appendix 5. Small hiatal hernia. 6.  Aortic Atherosclerosis (ICD10-I70.0). Electronically Signed   By: Dahlia Bailiff M.D.   On: 03/02/2022 17:35   MM 3D SCREEN BREAST UNI RIGHT  Result Date: 02/26/2022 CLINICAL DATA:  Screening. EXAM: DIGITAL SCREENING UNILATERAL RIGHT MAMMOGRAM WITH CAD AND TOMOSYNTHESIS TECHNIQUE: Right screening digital craniocaudal and mediolateral oblique mammograms were obtained. Right screening digital breast tomosynthesis was performed. The images were evaluated with computer-aided detection. COMPARISON:  Previous exam(s). ACR Breast Density Category d: The breast tissue is extremely dense, which lowers the sensitivity of mammography. FINDINGS: The patient has had a left mastectomy. There are no findings suspicious for malignancy. IMPRESSION: No mammographic evidence of malignancy. A result letter of this screening mammogram will be mailed directly to the patient. RECOMMENDATION: Screening mammogram in one year.  (Code:SM-R-32M) BI-RADS CATEGORY  1: Negative. -Please Insert Correct Screening Template Electronically Signed   By: Ammie Ferrier M.D.   On: 02/26/2022 12:41    Microbiology: Results for orders placed or performed during the hospital encounter of 03/02/22  Gastrointestinal Panel by PCR , Stool     Status: Abnormal   Collection Time: 03/02/22  3:32 PM   Specimen: Stool  Result Value Ref Range Status   Campylobacter species NOT DETECTED NOT DETECTED Final   Plesimonas shigelloides NOT DETECTED NOT DETECTED Final   Salmonella species NOT DETECTED NOT DETECTED Final   Yersinia enterocolitica NOT DETECTED NOT DETECTED Final   Vibrio species NOT DETECTED NOT DETECTED Final   Vibrio cholerae NOT DETECTED NOT DETECTED Final   Enteroaggregative E coli (EAEC) NOT DETECTED NOT DETECTED Final   Enteropathogenic E coli (EPEC) DETECTED (A) NOT DETECTED Final     Comment: RESULT CALLED TO, READ BACK BY AND VERIFIED WITH: Caryl Asp RN 1201 03/04/22 HNM    Enterotoxigenic E coli (ETEC) NOT DETECTED NOT DETECTED Final   Shiga like toxin producing E coli (STEC) NOT DETECTED NOT DETECTED Final   Shigella/Enteroinvasive E coli (EIEC) NOT DETECTED NOT DETECTED Final   Cryptosporidium NOT DETECTED NOT DETECTED Final   Cyclospora cayetanensis NOT DETECTED NOT DETECTED Final   Entamoeba histolytica NOT DETECTED NOT DETECTED Final   Giardia lamblia NOT DETECTED NOT DETECTED Final   Adenovirus F40/41 NOT DETECTED NOT DETECTED Final   Astrovirus NOT DETECTED NOT DETECTED Final   Norovirus GI/GII NOT DETECTED NOT DETECTED Final   Rotavirus A NOT DETECTED NOT DETECTED Final   Sapovirus (I, II, IV, and V) NOT DETECTED NOT DETECTED Final    Comment: Performed at [facility], [address], [place], [place] [address]  Urine Culture  Status: Abnormal   Collection Time: 03/02/22  4:38 PM   Specimen: Urine, Clean Catch  Result Value Ref Range Status   Specimen Description   Final    URINE, CLEAN CATCH Performed at [facility], [address], [place], [place] [address]    Special Requests   Final    NONE Performed at [facility], [address], [place], [place] [address]    Culture (A)  Final    <10,000 COLONIES/mL INSIGNIFICANT GROWTH Performed at [facility] [address] [place], [place] [address]    Report Status 03/03/2022 FINAL  Final  SARS Coronavirus 2 by RT PCR (hospital order, performed in [facility] hospital lab) *cepheid single result test* Anterior Nasal Swab     Status: Abnormal   Collection Time: 03/02/22  6:00 PM   Specimen: Anterior Nasal Swab  Result Value Ref Range Status   SARS Coronavirus 2 by RT PCR POSITIVE (A) NEGATIVE Final    Comment: (NOTE) SARS-CoV-2 target nucleic acids are DETECTED  SARS-CoV-2 RNA is generally detectable in upper respiratory specimens  during the acute phase of infection.   Positive results are indicative  of the presence of the identified virus, but do not rule out bacterial infection or co-infection with other pathogens not detected by the test.  Clinical correlation with patient history and  other diagnostic information is necessary to determine patient infection status.  The expected result is negative.  Fact Sheet for Patients:   https://www.patel.info/   Fact Sheet for Healthcare Providers:   https://hall.com/    This test is not yet approved or cleared by the [place] FDA and  has been authorized for detection and/or diagnosis of SARS-CoV-2 by FDA under an Emergency Use Authorization (EUA).  This EUA will remain in effect (meaning this test can be used) for the duration of  the COVID-19 declaration under Section 564(b)(1)  of the Act, 21 U.S.C. section 360-bbb-3(b)(1), unless the authorization is terminated or revoked sooner.   Performed at [facility], [address] [place], [place] [address]     Labs: CBC: Recent Labs  Lab 03/02/22 1153 03/03/22 0347 03/04/22 0347 03/05/22 0606  WBC 4.0 2.9* 2.6* 3.0*  NEUTROABS  --  1.1* 1.0* 0.7*  HGB 12.9 11.2* 10.4* 11.8*  HCT 36.8 31.9* 30.0* 34.2*  MCV 99.5 98.2 100.3* 101.2*  PLT 299 274 258 423   Basic Metabolic Panel: Recent Labs  Lab 03/02/22 1153 03/03/22 0347 03/04/22 0347 03/05/22 0606  NA 120* 124* 134* 135  K 4.5 3.3* 3.1* 3.7  CL 86* 97* 108 107  CO2 24 23 21* 24  GLUCOSE 90 81 73 75  BUN 7* 6* <5* <5*  CREATININE 0.53 0.52 0.49 0.48  CALCIUM 9.1 8.0* 8.2* 8.8*  MG  --  1.7 1.8 2.1  PHOS  --  3.7 3.2 2.6   Liver Function Tests: Recent Labs  Lab 03/02/22 1153 03/03/22 0347 03/04/22 0347 03/05/22 0606  AST 49* 36 27 27  ALT 43 32 27 27  ALKPHOS 63 51 45 52  BILITOT 0.7 0.8 0.7 0.6  PROT 7.3 5.8* 5.7* 6.5  ALBUMIN 4.5 3.6 3.5 4.0   CBG: No results for input(s): "GLUCAP" in the last 168 hours.  Discharge time  spent: greater than 30 minutes.  Signed: Deatra James, MD [facility] 03/07/2022

## 2022-03-16 ENCOUNTER — Ambulatory Visit: Payer: Medicare Other | Attending: Audiologist | Admitting: Audiologist

## 2022-03-16 DIAGNOSIS — H9103 Ototoxic hearing loss, bilateral: Secondary | ICD-10-CM | POA: Insufficient documentation

## 2022-03-16 NOTE — Procedures (Signed)
  Outpatient Audiology and [place], [place]  [phone]  AUDIOLOGICAL  EVALUATION  NAME: Samantha Mcfarland     DOB:   16-Jul-1955      MRN: [id number]                                                                                     DATE: 03/16/2022     REFERENT: Baruch Gouty, FNP STATUS: Outpatient DIAGNOSIS: Ototoxic Hearing Loss Bilaterally     History: [name] was seen for an audiological evaluation. [name] was accompanied to the appointment by her sister. [name] is receiving a hearing evaluation due to concerns for a drop in her hearing after receiving chemotherapy. [name] has difficulty hearing the TV. This difficulty began once she started treatment and has gradually gotten worse. No pain or pressure reported in either ear. Tinnitus denied in both ears. No other relevant case history reported.   Evaluation:  Otoscopy showed a clear view of the tympanic membranes, bilaterally Tympanometry results were consistent with normal middle ear pressure, bilaterally   Audiometric testing was completed using conventional audiometry with supraural high frequency transducer. Speech Recognition Thresholds were  45dB in the right ear and 50dB in the left ear. Word Recognition was performed 40dB SL, scored 60% in the right ear and 68% in the left ear. Pure tone thresholds show normal hearing at '250Hz'$  only dropping to moderately severe sensorineural hearing loss 750-8kHz bilaterally.   Results:  The test results were reviewed with [name] and her sister. [name] has a moderately severe sensorineural hearing loss likely due in part to her chemotherapy. [name] needs hearing aids for both ears. She was given a list of local providers and a handout about the [facility] Hearing benefit. [name] will also benefit from a sound bar to help her hear TV. She has some dried wax in both ears, recommend Debrox. Over all she needs her hering monitored until 6 months after her last dosage  of chemotherapy and she currently needs hearing aids for both ears. [name] reported understanding, she is motivated to try hearing aids.    Recommendations: Amplification is necessary for both ears. Hearing aids can be purchased from a variety of locations. See provided list for locations in the [place] area.  Retest hearing every 6 months due to ototoxic medication exposure. Hearing aid provider can perform this testing and update hearing aids as needed.    34 minutes spent testing and counseling on results.   [name]  Audiologist, Au.D., CCC-A 03/16/2022  10:54 AM  Cc: Baruch Gouty, FNP

## 2022-03-17 ENCOUNTER — Telehealth: Payer: Self-pay | Admitting: *Deleted

## 2022-03-17 ENCOUNTER — Encounter: Payer: Self-pay | Admitting: *Deleted

## 2022-03-17 NOTE — Patient Outreach (Signed)
  Care Coordination [facility] Note Transition Care Management Follow-up Telephone Call Date of discharge and from where: 03/05/22 from [facility] How have you been since you were released from the hospital? "I guess I'm alright. I feel alright" Any questions or concerns? Yes. Questions regarding CT scan results  Items Reviewed: Did the pt receive and understand the discharge instructions provided? No Received paperwork but doesn't recall being told to follow-up with PCP or oncologist and doesn't remember it being on the discharge papers Medications obtained and verified? Yes  Other? No  Any new allergies since your discharge? No  Dietary orders reviewed? Yes Do you have support at home? Yes   Home Care and Equipment/Supplies: Were home health services ordered? no If so, what is the name of the agency? N/a  Has the agency set up a time to come to the patient's home? not applicable Were any new equipment or medical supplies ordered?  No What is the name of the medical supply agency? N/a Were you able to get the supplies/equipment? not applicable Do you have any questions related to the use of the equipment or supplies? No  Functional Questionnaire: (I = Independent and D = Dependent) ADLs: I  Bathing/Dressing- I  Meal Prep- I  Eating- I  Maintaining continence- I  Transferring/Ambulation- I  Managing Meds- I  Follow up appointments reviewed:  PCP Hospital f/u appt confirmed? No Declines at this time.  [facility] f/u appt confirmed? Yes  Scheduled to see Dr Delton Coombes on 04/14/22 @ 2:15 for previously scheduled f/u. Are transportation arrangements needed? No  If their condition worsens, is the pt aware to call PCP or go to the Emergency Dept.? Yes Was the patient provided with contact information for the PCP's office or ED? Yes Was to pt encouraged to call back with questions or concerns? Yes  SDOH assessments and interventions completed:   Yes  Care Coordination  Interventions Activated:  Yes   Care Coordination Interventions:  Staff message sent to Dr Delton Coombes regarding current appointment on 10/25 and whether patient needs to be seen sooner as recommended in the discharge instructions. Asked that he have his staff call patient to schedule if she does need to be seen sooner    Encounter Outcome:  Pt. Visit Completed    Chong Sicilian, BSN, RN-BC [place]: [phone] Main #: [phone]

## 2022-03-18 ENCOUNTER — Other Ambulatory Visit: Payer: Self-pay

## 2022-03-18 DIAGNOSIS — K769 Liver disease, unspecified: Secondary | ICD-10-CM

## 2022-03-18 NOTE — Progress Notes (Signed)
Orders placed per Dr. Tomie China order-  Reason for MRI: Follow-up of liver lesion found on recent CT scan with history of breast cancer.

## 2022-03-26 ENCOUNTER — Ambulatory Visit: Payer: Self-pay | Admitting: *Deleted

## 2022-03-26 ENCOUNTER — Encounter: Payer: Self-pay | Admitting: *Deleted

## 2022-03-26 ENCOUNTER — Telehealth: Payer: Self-pay | Admitting: *Deleted

## 2022-03-26 ENCOUNTER — Encounter: Payer: Medicare Other | Admitting: *Deleted

## 2022-03-26 NOTE — Patient Outreach (Signed)
Encounter opened in error  [place]. Lavina Hamman, RN, BSN, [place] Coordinator Office number [phone]

## 2022-03-26 NOTE — Patient Outreach (Signed)
  Care Coordination   Follow Up Visit Note   03/26/2022 Name: Samantha Mcfarland MRN: [id number] DOB: 1956/05/12  Samantha Mcfarland is a 66 y.o. year old female who sees Rakes, Connye Burkitt, FNP for primary care. I spoke with  [name] by phone today.  What matters to the patients health and wellness today?  Managing hyponatremia, pending MRI for liver lesion (questions)   Her low sodium was triggered possibly by her liver lesion, nausea & vomiting, and increase in fluids She wrote down standard fluid intake. States she has not been placed on a fluid restriction after last admission  Stopped smoking per patient- commended her   MRI pending Do not like closed areas, reviewed open & closed MRIs and the use of anxiety medicine to assist She voiced understanding that she would not be given too much anxiety medicine that would prevent her from being able to follow directions during the MRI of holding her breath.      Hearing now qualify for hearing aid, she inquires of her insurance will cover the cost. She has a Rx to take to [facility] on 05/07/22   Goals Addressed               This Visit's Progress     Patient Stated     manage hearing loss [facility]) (pt-stated)   On track     Care Coordination Interventions: Discussed plans with patient for ongoing care management follow up and provided patient with direct contact information for care management team Confirmed completion of audiology testing. Confirms she has a prescription to take to [facility] on 05/07/22 to obtain hearing aides Answered questions about coverage for hearing aides by [place] access with prior authorization per [employer] site       Manage hyponatremia/liver lesion [facility]) (pt-stated)   On track     Care Coordination Interventions: Evaluation of current treatment plan related to hyponatremia. Liver lesion and patient's adherence to plan as established by provider Provided education to patient re: causes of  hyponatremia, dehydration (certain medications, heart, kidney & liver problems, syndrome of inappropriate anti-diuretic hormone (SIADH), chronic, severe vomiting or diarrhea & other causes of dehydration, Drinking too much water, hormonal changes, the recreational drug Ecstasy, importance of fluid replacement after vomiting Discussed plans with patient for ongoing care management follow up and provided patient with direct contact information for care management team Discuss the process of receiving a closed and open MRI. Reviewed standard fluid intake 8- 8 ounces, 12 oz bottles -5, 16 ounce bottles -4 Confirmed with patient that she does not like closed spaces ? Claustrophobia Discussed use of medication to help with anxiety prior to MRI but needing to be alert enough to hold her breath when asked by technician Education sent on MRI, nausea/vomiting, water intoxication, hyponatremia via my chart        SDOH assessments and interventions completed:  Yes     Care Coordination Interventions Activated:  Yes  Care Coordination Interventions:  Yes, provided   Follow up plan: Follow up call scheduled for 04/07/22 3 pm    Encounter Outcome:  Pt. Visit Completed   [place]ilino[place] Acklin L. Lavina Hamman, RN, BSN, [place] Coordinator Office number [phone]

## 2022-03-26 NOTE — Patient Instructions (Addendum)
Visit Information  Thank you for taking time to visit with me today. Please don't hesitate to contact me if I can be of assistance to you.    standard fluid intake  8- 8 ounces,  12 ounce bottles -5,  16 ounce bottles -4   Following are the goals we discussed today:   Goals Addressed               This Visit's Progress     Patient Stated     manage hearing loss [facility]) (pt-stated)   On track     Care Coordination Interventions: Discussed plans with patient for ongoing care management follow up and provided patient with direct contact information for care management team Confirmed completion of audiology testing. Confirms she has a prescription to take to [facility] on 05/07/22 to obtain hearing aides Answered questions about coverage for hearing aides by [place] access with prior authorization per [employer] site       Manage hyponatremia/liver lesion [facility]) (pt-stated)   On track     Care Coordination Interventions: Evaluation of current treatment plan related to hyponatremia. Liver lesion and patient's adherence to plan as established by provider Provided education to patient re: causes of hyponatremia, dehydration (certain medications, heart, kidney & liver problems, syndrome of inappropriate anti-diuretic hormone (SIADH), chronic, severe vomiting or diarrhea & other causes of dehydration, Drinking too much water, hormonal changes, the recreational drug Ecstasy, importance of fluid replacement after vomiting Discussed plans with patient for ongoing care management follow up and provided patient with direct contact information for care management team Discuss the process of receiving a closed and open MRI. Reviewed standard fluid intake 8- 8 ounces, 12 oz bottles -5, 16 ounce bottles -4 Confirmed with patient that she does not like closed spaces ? Claustrophobia Discussed use of medication to help with anxiety prior to MRI but needing to be alert enough to hold  her breath when asked by technician Education sent on MRI, nausea/vomiting, water intoxication, hyponatremia via my chart        Our next appointment is by telephone on 04/07/22 at 3 pm  Please call the care guide team at [phone] if you need to cancel or reschedule your appointment.   If you are experiencing a Mental Health or [place] or need someone to talk to, please call the Suicide and Crisis Lifeline: 988 call the [place] National Suicide Prevention Lifeline: (315)658-1984 or TTY: 707-708-7000 TTY (351)544-2592) to talk to a trained counselor call 1-800-273-TALK (toll free, 24 hour hotline) call the [facility]: ([phone] call 911   Patient verbalizes understanding of instructions and care plan provided today and agrees to view in [place]. Active MyChart status and patient understanding of how to access instructions and care plan via MyChart confirmed with patient.     The patient has been provided with contact information for the care management team and has been advised to call with any health related questions or concerns.   [place] Lavina Hamman, RN, BSN, [place] Coordinator Office number [phone]

## 2022-03-31 ENCOUNTER — Ambulatory Visit (HOSPITAL_COMMUNITY)
Admission: RE | Admit: 2022-03-31 | Discharge: 2022-03-31 | Disposition: A | Discharge status: Home / Self Care | Payer: Medicare Other | Source: Ambulatory Visit | Attending: Hematology | Admitting: Hematology

## 2022-03-31 DIAGNOSIS — K449 Diaphragmatic hernia without obstruction or gangrene: Secondary | ICD-10-CM | POA: Diagnosis not present

## 2022-03-31 DIAGNOSIS — Z853 Personal history of malignant neoplasm of breast: Secondary | ICD-10-CM | POA: Diagnosis not present

## 2022-03-31 DIAGNOSIS — K769 Liver disease, unspecified: Secondary | ICD-10-CM | POA: Diagnosis not present

## 2022-03-31 DIAGNOSIS — D1803 Hemangioma of intra-abdominal structures: Secondary | ICD-10-CM | POA: Diagnosis not present

## 2022-03-31 MED ORDER — GADOBUTROL 1 MMOL/ML IV SOLN
5.0000 mL | Freq: Once | INTRAVENOUS | Status: AC | PRN
  Administered 2022-03-31: 5 mL via INTRAVENOUS

## 2022-04-01 ENCOUNTER — Inpatient Hospital Stay: Payer: Medicare Other | Attending: Hematology

## 2022-04-01 DIAGNOSIS — Z79899 Other long term (current) drug therapy: Secondary | ICD-10-CM | POA: Diagnosis not present

## 2022-04-01 DIAGNOSIS — Z170 Estrogen receptor positive status [ER+]: Secondary | ICD-10-CM | POA: Insufficient documentation

## 2022-04-01 DIAGNOSIS — C50912 Malignant neoplasm of unspecified site of left female breast: Secondary | ICD-10-CM | POA: Diagnosis not present

## 2022-04-01 DIAGNOSIS — E559 Vitamin D deficiency, unspecified: Secondary | ICD-10-CM

## 2022-04-01 DIAGNOSIS — Z79811 Long term (current) use of aromatase inhibitors: Secondary | ICD-10-CM | POA: Diagnosis not present

## 2022-04-01 DIAGNOSIS — G893 Neoplasm related pain (acute) (chronic): Secondary | ICD-10-CM | POA: Diagnosis not present

## 2022-04-01 DIAGNOSIS — I10 Essential (primary) hypertension: Secondary | ICD-10-CM | POA: Insufficient documentation

## 2022-04-01 LAB — CBC WITH DIFFERENTIAL/PLATELET
Abs Immature Granulocytes: 0.01 10*3/uL (ref 0.00–0.07)
Basophils Absolute: 0.1 10*3/uL (ref 0.0–0.1)
Basophils Relative: 1 %
Eosinophils Absolute: 0.3 10*3/uL (ref 0.0–0.5)
Eosinophils Relative: 6 %
HCT: 32.8 % — ABNORMAL LOW (ref 36.0–46.0)
Hemoglobin: 11.2 g/dL — ABNORMAL LOW (ref 12.0–15.0)
Immature Granulocytes: 0 %
Lymphocytes Relative: 39 %
Lymphs Abs: 1.8 10*3/uL (ref 0.7–4.0)
MCH: 34.7 pg — ABNORMAL HIGH (ref 26.0–34.0)
MCHC: 34.1 g/dL (ref 30.0–36.0)
MCV: 101.5 fL — ABNORMAL HIGH (ref 80.0–100.0)
Monocytes Absolute: 0.5 10*3/uL (ref 0.1–1.0)
Monocytes Relative: 10 %
Neutro Abs: 2 10*3/uL (ref 1.7–7.7)
Neutrophils Relative %: 44 %
Platelets: 283 10*3/uL (ref 150–400)
RBC: 3.23 MIL/uL — ABNORMAL LOW (ref 3.87–5.11)
RDW: 12.7 % (ref 11.5–15.5)
WBC: 4.5 10*3/uL (ref 4.0–10.5)
nRBC: 0 % (ref 0.0–0.2)

## 2022-04-01 LAB — COMPREHENSIVE METABOLIC PANEL
ALT: 35 U/L (ref 0–44)
AST: 39 U/L (ref 15–41)
Albumin: 4.4 g/dL (ref 3.5–5.0)
Alkaline Phosphatase: 47 U/L (ref 38–126)
Anion gap: 8 (ref 5–15)
BUN: 12 mg/dL (ref 8–23)
CO2: 27 mmol/L (ref 22–32)
Calcium: 9.5 mg/dL (ref 8.9–10.3)
Chloride: 104 mmol/L (ref 98–111)
Creatinine, Ser: 0.5 mg/dL (ref 0.44–1.00)
GFR, Estimated: >60 mL/min (ref >60)
Glucose, Bld: 92 mg/dL (ref 70–99)
Potassium: 4.7 mmol/L (ref 3.5–5.1)
Sodium: 139 mmol/L (ref 135–145)
Total Bilirubin: 0.6 mg/dL (ref 0.3–1.2)
Total Protein: 6.9 g/dL (ref 6.5–8.1)

## 2022-04-01 LAB — OSMOLALITY: Osmolality: 284 mOsm/kg (ref 275–295)

## 2022-04-06 ENCOUNTER — Inpatient Hospital Stay (HOSPITAL_BASED_OUTPATIENT_CLINIC_OR_DEPARTMENT_OTHER): Payer: Medicare Other | Admitting: Hematology

## 2022-04-06 VITALS — BP 138/84 | HR 64 | Temp 97.6°F | Resp 18 | Ht 66.0 in | Wt 124.7 lb

## 2022-04-06 DIAGNOSIS — C50912 Malignant neoplasm of unspecified site of left female breast: Secondary | ICD-10-CM

## 2022-04-06 DIAGNOSIS — I10 Essential (primary) hypertension: Secondary | ICD-10-CM | POA: Diagnosis not present

## 2022-04-06 DIAGNOSIS — Z170 Estrogen receptor positive status [ER+]: Secondary | ICD-10-CM | POA: Diagnosis not present

## 2022-04-06 DIAGNOSIS — G893 Neoplasm related pain (acute) (chronic): Secondary | ICD-10-CM | POA: Diagnosis not present

## 2022-04-06 DIAGNOSIS — Z79811 Long term (current) use of aromatase inhibitors: Secondary | ICD-10-CM | POA: Diagnosis not present

## 2022-04-06 DIAGNOSIS — Z79899 Other long term (current) drug therapy: Secondary | ICD-10-CM | POA: Diagnosis not present

## 2022-04-06 NOTE — Patient Instructions (Signed)
[place] at [facility] Discharge Instructions   You were seen and examined today by Dr. Delton Coombes.  He reviewed the results of your MRI which did not show any cancer.  He reviewed the results of your mammogram which is normal.   Continue anastrozole 1 mg daily as prescribed.  Return as scheduled in 6 months.    Thank you for choosing [place] at [facility] to provide your oncology and hematology care.  To afford each patient quality time with our provider, please arrive at least 15 minutes before your scheduled appointment time.   If you have a lab appointment with the [place] please come in thru the Main Entrance and check in at the main information desk.  You need to re-schedule your appointment should you arrive 10 or more minutes late.  We strive to give you quality time with our providers, and arriving late affects you and other patients whose appointments are after yours.  Also, if you no show three or more times for appointments you may be dismissed from the clinic at the providers discretion.     Again, thank you for choosing [facility].  Our hope is that these requests will decrease the amount of time that you wait before being seen by our physicians.       _____________________________________________________________  Should you have questions after your visit to [facility], please contact our office at [phone] and follow the prompts.  Our office hours are 8:00 a.m. and 4:30 p.m. Monday - Friday.  Please note that voicemails left after 4:00 p.m. may not be returned until the following business day.  We are closed weekends and major holidays.  You do have access to a nurse 24-7, just call the main number to the clinic [phone] and do not press any options, hold on the line and a nurse will answer the phone.    For prescription refill requests, have your pharmacy contact our office and  allow 72 hours.    Due to Covid, you will need to wear a mask upon entering the hospital. If you do not have a mask, a mask will be given to you at the Main Entrance upon arrival. For doctor visits, patients may have 1 support person age 30 or older with them. For treatment visits, patients can not have anyone with them due to social distancing guidelines and our immunocompromised population.

## 2022-04-06 NOTE — Progress Notes (Signed)
Samantha Mcfarland [address], [place] [address]   Patient Care Team: Rakes, Connye Burkitt, FNP as PCP - General (Family Medicine) Derek Jack, MD as Medical Oncologist (Medical Oncology) Brien Mates, RN as Oncology Nurse Navigator (Oncology) Barbaraann Faster, RN as [place] Management  SUMMARY OF ONCOLOGIC HISTORY: Oncology History  Breast cancer, left [facility])  04/22/2021 Initial Diagnosis   Breast cancer, left ([place])   04/22/2021 Cancer Staging   Staging form: Breast, AJCC 8th Edition - Clinical stage from 04/22/2021: Stage IA (cT1c, cN0, cM0, G2, ER+, PR-, HER2-) - Signed by Derek Jack, MD on 04/22/2021 Stage prefix: Initial diagnosis Nuclear grade: G2 Histologic grading system: 3 grade system HER2-IHC interpretation: Negative HER2-IHC value: Score 1+ Ki-67 (%): 25   05/28/2021 - 08/07/2021 Chemotherapy   Patient is on Treatment Plan : BREAST TC q21d      Genetic Testing   Negative genetic testing. No pathogenic variants identified on the [facility] Multi-Cancer+RNA panel. The report date is 05/10/2021.  The Multi-Cancer Panel + RNA offered by [facility] includes sequencing and/or deletion duplication testing of the following 84 genes: AIP, ALK, APC, ATM, AXIN2,BAP1,  BARD1, BLM, BMPR1A, BRCA1, BRCA2, BRIP1, CASR, CDC73, CDH1, CDK4, CDKN1B, CDKN1C, CDKN2A (p14ARF), CDKN2A (p16INK4a), CEBPA, CHEK2, CTNNA1, DICER1, DIS3L2, EGFR (c.2369C>T, p.Thr790Met variant only), EPCAM (Deletion/duplication testing only), FH, FLCN, GATA2, GPC3, GREM1 (Promoter region deletion/duplication testing only), HOXB13 (c.251G>A, p.Gly84Glu), HRAS, KIT, MAX, MEN1, MET, MITF (c.952G>A, p.Glu318Lys variant only), MLH1, MSH2, MSH3, MSH6, MUTYH, NBN, NF1, NF2, NTHL1, PALB2, PDGFRA, PHOX2B, PMS2, POLD1, POLE, POT1, PRKAR1A, PTCH1, PTEN, RAD50, RAD51C, RAD51D, RB1, RECQL4, RET, RUNX1, SDHAF2, SDHA (sequence changes only), SDHB, SDHC, SDHD, SMAD4, SMARCA4, SMARCB1,  SMARCE1, STK11, SUFU, TERC, TERT, TMEM127, TP53, TSC1, TSC2, VHL, WRN and WT1.     CHIEF COMPLIANT: Follow-up for left breast cancer   INTERVAL HISTORY: [name] is a 66 y.o. female seen for follow-up of her left breast cancer.  She was recently hospitalized with severe hyponatremia.  She stopped drinking beers.  She is tolerating anastrozole very well.  REVIEW OF SYSTEMS:   Review of Systems  Gastrointestinal:  Positive for nausea.  Endocrine: Positive for hot flashes (Mild).  Musculoskeletal:  Positive for arthralgias (5/10 legs).  Neurological:  Positive for headaches.  Psychiatric/Behavioral:  Positive for depression.   All other systems reviewed and are negative.   I have reviewed the past medical history, past surgical history, social history and family history with the patient and they are unchanged from previous note.   ALLERGIES:   is allergic to ibuprofen and penicillins.   MEDICATIONS:  Current Outpatient Medications  Medication Sig Dispense Refill   acetaminophen (TYLENOL) 325 MG tablet Take 325 mg by mouth every 6 (six) hours as needed for moderate pain or headache.     anastrozole (ARIMIDEX) 1 MG tablet Take 1 tablet (1 mg total) by mouth daily. 90 tablet 4   atorvastatin (LIPITOR) 20 MG tablet Take 1 tablet (20 mg total) by mouth daily. (NEEDS TO BE SEEN BEFORE NEXT REFILL) 30 tablet 0   cholecalciferol (VITAMIN D3) 25 MCG (1000 UNIT) tablet Take 1,000 Units by mouth daily.     diphenhydrAMINE HCl (ALLERGY MEDICATION PO) Take 1 tablet by mouth as needed (allergies).     gabapentin (NEURONTIN) 300 MG capsule TAKE 1 CAPSULE 2 TIMES A DAY (Patient taking differently: Take 300 mg by mouth 2 (two) times daily.) 60 capsule 3   loperamide (IMODIUM) 2 MG capsule  Take 1 capsule (2 mg total) by mouth as needed for diarrhea or loose stools. 30 capsule 0   ondansetron (ZOFRAN) 4 MG tablet Take 1 tablet (4 mg total) by mouth every 8 (eight) hours as needed for nausea or  vomiting. 20 tablet 0   vitamin B-12 (CYANOCOBALAMIN) 500 MCG tablet Take 500 mcg by mouth daily.     No current facility-administered medications for this visit.     PHYSICAL EXAMINATION: Performance status (ECOG): 1 - Symptomatic but completely ambulatory  There were no vitals filed for this visit. Wt Readings from Last 3 Encounters:  03/02/22 120 lb (54.4 kg)  02/09/22 115 lb 12.8 oz (52.5 kg)  02/01/22 112 lb 3.4 oz (50.9 kg)   Physical Exam Vitals reviewed.  Constitutional:      Appearance: Normal appearance.  Cardiovascular:     Rate and Rhythm: Normal rate and regular rhythm.     Pulses: Normal pulses.     Heart sounds: Normal heart sounds.  Pulmonary:     Effort: Pulmonary effort is normal.     Breath sounds: Normal breath sounds.  Neurological:     General: No focal deficit present.     Mental Status: She is alert and oriented to person, place, and time.  Psychiatric:        Mood and Affect: Mood normal.        Behavior: Behavior normal.     Breast Exam Chaperone: Thana Ates     LABORATORY DATA:  I have reviewed the data as listed    Latest Ref Rng & Units 04/01/2022   11:45 AM 03/05/2022    6:06 AM 03/04/2022    3:47 AM  CMP  Glucose 70 - 99 mg/dL 92  75  73   BUN 8 - 23 mg/dL 12  <5  <5   Creatinine 0.44 - 1.00 mg/dL 0.50  0.48  0.49   Sodium 135 - 145 mmol/L 139  135  134   Potassium 3.5 - 5.1 mmol/L 4.7  3.7  3.1   Chloride 98 - 111 mmol/L 104  107  108   CO2 22 - 32 mmol/L $RemoveB'27  24  21   'CzvWYbgI$ Calcium 8.9 - 10.3 mg/dL 9.5  8.8  8.2   Total Protein 6.5 - 8.1 g/dL 6.9  6.5  5.7   Total Bilirubin 0.3 - 1.2 mg/dL 0.6  0.6  0.7   Alkaline Phos 38 - 126 U/L 47  52  45   AST 15 - 41 U/L 39  27  27   ALT 0 - 44 U/L 35  27  27    No results found for: "CAN153" Lab Results  Component Value Date   WBC 4.5 04/01/2022   HGB 11.2 (L) 04/01/2022   HCT 32.8 (L) 04/01/2022   MCV 101.5 (H) 04/01/2022   PLT 283 04/01/2022   NEUTROABS 2.0 04/01/2022     ASSESSMENT:  Stage I (T1CN0) left breast IDC, ER positive, HER2/PR negative: - Abnormal mammogram followed by left breast subareolar mass biopsy on 02/24/2021 consistent with DCIS intermediate to high-grade - Left mastectomy and SLNB on 03/23/2021 - Pathology shows 1.5 cm IDC, grade 2, extensive DCIS, margins negative, 0/2 left axillary lymph nodes, PT1CN0.  ER 100%.  PR-negative.  HER2 1+.  Ki-67-25%. - Oncotype DX showed recurrence score 33.  There is more than 15% chemotherapy benefit.  Distant recurrence at 9 years with tamoxifen alone was 21%. - 4 cycles of TC from 05/28/2021 through 08/06/2021. -  [facility] genetic testing negative. - Anastrozole started on 08/27/2021.   Social/family history: - She lives by herself.  She is seen with her sister today. 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 - She quit smoking 1 month ago.  Half pack per day for 35 years. - She drinks alcohol 3-5 beers daily. - Father had prostate and pancreatic cancer.  Maternal grandmother and maternal aunt had breast cancer.  Paternal grandfather had colon cancer.  Other people in her family also had cancers, type unknown to the patient.   PLAN:  Stage I (T1CN0) left breast IDC, ER positive, HER2/PR negative, Oncotype DX 33: - She has recent hospitalization with hyponatremia. - She is tolerating anastrozole very well. - I have reviewed mammogram right breast (02/25/2022): BI-RADS Category 1. - Imaging done in the hospital showed questionable hepatic lesion.  Hence I have reviewed MRI of the liver from 03/31/2022 which showed 2 small benign hemangiomas in the dome of the liver corresponding to the liver lesions on CT scan.  No cystic lesion identified in the pancreatic uncinate process. - Reviewed labs today which showed improvement in her sodium to normal.  LFTs are normal.  CBC was grossly normal.  Continue anastrozole daily.  RTC 6 months for follow-up with repeat labs.  2.  Bone health  (osteopenia): - DEXA scan on 04/30/2021 with T score -1.5. - Continue calcium and D supplements.  3.  Chronic pain syndrome: - She has chronic leg pains and neck pains.  Continue gabapentin 300 mg 3 times daily. - Continue follow-up with the pain clinic in [place].  4.  Hypotension: - She reports that she is holding lisinopril.  Blood pressure today is 138/84.  Breast Cancer therapy associated bone loss: I have recommended calcium, Vitamin D and weight bearing exercises.  Orders placed this encounter:  No orders of the defined types were placed in this encounter.   The patient has a good understanding of the overall plan. She agrees with it. She will call with any problems that may develop before the next visit here.  Derek Jack, MD [place] [phone]

## 2022-04-07 ENCOUNTER — Ambulatory Visit: Payer: Self-pay | Admitting: *Deleted

## 2022-04-07 NOTE — Patient Outreach (Signed)
  Care Coordination   04/07/2022 Name: Samantha Mcfarland MRN: [id number] DOB: [birth year]   Care Coordination Outreach Attempts:  An unsuccessful telephone outreach was attempted today to offer the patient information about available care coordination services as a benefit of their health plan.   Follow Up Plan:  Additional outreach attempts will be made to offer the patient care coordination information and services.   Encounter Outcome:  No Answer  Care Coordination Interventions Activated:  No   Care Coordination Interventions:  No, not indicated     Kamira Mellette L. Lavina Hamman, RN, BSN, [place] Coordinator Office number [phone]

## 2022-04-08 ENCOUNTER — Ambulatory Visit (HOSPITAL_COMMUNITY): Payer: Medicare Other | Admitting: Hematology

## 2022-04-14 ENCOUNTER — Ambulatory Visit: Payer: Medicare Other | Admitting: Hematology

## 2022-04-15 ENCOUNTER — Ambulatory Visit: Payer: Self-pay | Admitting: *Deleted

## 2022-04-15 NOTE — Patient Outreach (Signed)
  Care Coordination   Follow Up Visit Note   04/15/2022 Name: Samantha Mcfarland MRN: [id number] DOB: 06-14-1956  Samantha Mcfarland is a 66 y.o. year old female who sees Rakes, Connye Burkitt, FNP for primary care. I spoke with  Samantha Mcfarland by phone today.  What matters to the patients health and wellness today?  Follow up outreach She reports she is doing very well. She had her last visit for 2023 with her oncologist and does not have to return for 6 months She plans to have her sister take her to get hearing aides, has prescription   Asked questions about insurance coverage. Referred her to her plan and encouraged her to call for issues when she goes to obtain them   Hypertension  Her systolic blood pressure values has been below 140  She reports she feels better and has returned to exercising  Rides her bike 30 minutes daily and does arm exercises   Goals Addressed               This Visit's Progress     Patient Stated     manage hearing loss [facility]) (pt-stated)   On track     Care Coordination Interventions: Discussed plans with patient for ongoing care management follow up and provided patient with direct contact information for care management team  Confirms she has a prescription to take to [facility] on 05/07/22 to obtain hearing aides Answered questions about coverage for hearing aides and encouraged her to call RN CM as needed       Manage hypertension ([facility]) (pt-stated)   On track     Care Coordination Interventions: Evaluation of current treatment plan related to hypertension self management and patient's adherence to plan as established by provider Provided assistance with obtaining home blood pressure monitor via [place] [facility] over the counter benefit/U card; Discussed plans with patient for ongoing care management follow up and provided patient with direct contact information for care management team Advised patient, providing education and rationale, to monitor blood pressure  daily and record, calling PCP for findings outside established parameters Screening for signs and symptoms of depression related to chronic disease state  Assessed social determinant of health barriers      Manage hyponatremia/liver lesion [facility]) (pt-stated)   On track     Care Coordination Interventions: Evaluation of current treatment plan related to hyponatremia. Liver lesion and patient's adherence to plan as established by provider Discussed plans with patient for ongoing care management follow up and provided patient with direct contact information for care management team Assessed for worsening hyponatremia symptoms - None.        SDOH assessments and interventions completed:  Yes  SDOH Interventions Today    Flowsheet Row Most Recent Value  SDOH Interventions   Food Insecurity Interventions Intervention Not Indicated  Transportation Interventions Intervention Not Indicated  Utilities Interventions Intervention Not Indicated        Care Coordination Interventions Activated:  Yes  Care Coordination Interventions:  Yes, provided   Follow up plan: Follow up call scheduled for 05/27/22 1330    Encounter Outcome:  Pt. Visit Completed   [place]ern[place] Torry L. Lavina Hamman, RN, BSN, [place] Coordinator Office number [phone]

## 2022-04-15 NOTE — Patient Instructions (Addendum)
Visit Information  Thank you for taking time to visit with me today. Please don't hesitate to contact me if I can be of assistance to you.   Following are the goals we discussed today:   Goals Addressed               This Visit's Progress     Patient Stated     manage hearing loss [facility]) (pt-stated)   On track     Care Coordination Interventions: Discussed plans with patient for ongoing care management follow up and provided patient with direct contact information for care management team  Confirms she has a prescription to take to [name] on 05/07/22 to obtain hearing aides Answered questions about coverage for hearing aides and encouraged her to call RN CM as needed       Manage hypertension ([facility]) (pt-stated)   On track     Care Coordination Interventions: Evaluation of current treatment plan related to hypertension self management and patient's adherence to plan as established by provider Provided assistance with obtaining home blood pressure monitor via [place] [facility] over the counter benefit/U card; Discussed plans with patient for ongoing care management follow up and provided patient with direct contact information for care management team Advised patient, providing education and rationale, to monitor blood pressure daily and record, calling PCP for findings outside established parameters Screening for signs and symptoms of depression related to chronic disease state  Assessed social determinant of health barriers      Manage hyponatremia/liver lesion [facility]) (pt-stated)   On track     Care Coordination Interventions: Evaluation of current treatment plan related to hyponatremia. Liver lesion and patient's adherence to plan as established by provider Discussed plans with patient for ongoing care management follow up and provided patient with direct contact information for care management team Assessed for worsening hyponatremia symptoms - None.        Our next  appointment is by telephone on 05/27/22 at 1:30 pm  Please call the care guide team at [phone] if you need to cancel or reschedule your appointment.   If you are experiencing a Mental Health or [place] or need someone to talk to, please call the Suicide and Crisis Lifeline: 988 call the [place] National Suicide Prevention Lifeline: 778-859-8036 or TTY: 508-021-1778 TTY 937-522-9572) to talk to a trained counselor call 1-800-273-TALK (toll free, 24 hour hotline) call the [facility]: [phone] call 911   Patient verbalizes understanding of instructions and care plan provided today and agrees to view in [place]. Active MyChart status and patient understanding of how to access instructions and care plan via MyChart confirmed with patient.     The patient has been provided with contact information for the care management team and has been advised to call with any health related questions or concerns.    Samantha Mcfarland L. Lavina Hamman, RN, BSN, [place] Coordinator Office number [phone]

## 2022-04-22 DIAGNOSIS — H905 Unspecified sensorineural hearing loss: Secondary | ICD-10-CM | POA: Diagnosis not present

## 2022-05-24 ENCOUNTER — Ambulatory Visit (INDEPENDENT_AMBULATORY_CARE_PROVIDER_SITE_OTHER): Payer: Medicare Other

## 2022-05-24 VITALS — Ht 66.0 in | Wt 122.0 lb

## 2022-05-24 DIAGNOSIS — Z0000 Encounter for general adult medical examination without abnormal findings: Secondary | ICD-10-CM | POA: Diagnosis not present

## 2022-05-24 DIAGNOSIS — Z1211 Encounter for screening for malignant neoplasm of colon: Secondary | ICD-10-CM

## 2022-05-24 NOTE — Patient Instructions (Signed)
Samantha Mcfarland , Thank you for taking time to come for your Medicare Wellness Visit. I appreciate your ongoing commitment to your health goals. Please review the following plan we discussed and let me know if I can assist you in the future.   These are the goals we discussed:  Goals       Exercise 150 min/wk Moderate Activity      manage hearing loss ([facility]) (pt-stated)      Care Coordination Interventions: Discussed plans with patient for ongoing care management follow up and provided patient with direct contact information for care management team  Confirms she has a prescription to take to [facility] on 05/07/22 to obtain hearing aides Answered questions about coverage for hearing aides and encouraged her to call RN CM as needed       Manage hypertension ([facility]) (pt-stated)      Care Coordination Interventions: Evaluation of current treatment plan related to hypertension self management and patient's adherence to plan as established by provider Provided assistance with obtaining home blood pressure monitor via [place] [facility] over the counter benefit/U card; Discussed plans with patient for ongoing care management follow up and provided patient with direct contact information for care management team Advised patient, providing education and rationale, to monitor blood pressure daily and record, calling PCP for findings outside established parameters Screening for signs and symptoms of depression related to chronic disease state  Assessed social determinant of health barriers      Manage hyponatremia/liver lesion [facility]) (pt-stated)      Care Coordination Interventions: Evaluation of current treatment plan related to hyponatremia. Liver lesion and patient's adherence to plan as established by provider Discussed plans with patient for ongoing care management follow up and provided patient with direct contact information for care management team Assessed for worsening hyponatremia symptoms -  None.        This is a list of the screening recommended for you and due dates:  Health Maintenance  Topic Date Due   Pneumonia Vaccine (1 - PCV) Never done   DTaP/Tdap/Td vaccine (1 - Tdap) Never done   Zoster (Shingles) Vaccine (1 of 2) Never done   Colon Cancer Screening  Never done   COVID-19 Vaccine (3 - Moderna risk series) 05/16/2020   Flu Shot  09/19/2022*   Hepatitis C Screening: USPSTF Recommendation to screen - Ages 18-79 yo.  02/10/2023*   Mammogram  02/26/2023   Medicare Annual Wellness Visit  05/25/2023   DEXA scan (bone density measurement)  Completed   HPV Vaccine  Aged Out  *Topic was postponed. The date shown is not the original due date.    Advanced directives: Please bring a copy of your health care power of attorney and living will to the office to be added to your chart at your convenience.   Conditions/risks identified: Aim for 30 minutes of exercise or brisk walking, 6-8 glasses of water, and 5 servings of fruits and vegetables each day.   Next appointment: Follow up in one year for your annual wellness visit    Preventive Care 65 Years and Older, Female Preventive care refers to lifestyle choices and visits with your health care provider that can promote health and wellness. What does preventive care include? A yearly physical exam. This is also called an annual well check. Dental exams once or twice a year. Routine eye exams. Ask your health care provider how often you should have your eyes checked. Personal lifestyle choices, including: Daily care of your teeth  and gums. Regular physical activity. Eating a healthy diet. Avoiding tobacco and drug use. Limiting alcohol use. Practicing safe sex. Taking low-dose aspirin every day. Taking vitamin and mineral supplements as recommended by your health care provider. What happens during an annual well check? The services and screenings done by your health care provider during your annual well check will  depend on your age, overall health, lifestyle risk factors, and family history of disease. Counseling  Your health care provider may ask you questions about your: Alcohol use. Tobacco use. Drug use. Emotional well-being. Home and relationship well-being. Sexual activity. Eating habits. History of falls. Memory and ability to understand (cognition). Work and work Statistician. Reproductive health. Screening  You may have the following tests or measurements: Height, weight, and BMI. Blood pressure. Lipid and cholesterol levels. These may be checked every 5 years, or more frequently if you are over 58 years old. Skin check. Lung cancer screening. You may have this screening every year starting at age 61 if you have a 30-pack-year history of smoking and currently smoke or have quit within the past 15 years. Fecal occult blood test (FOBT) of the stool. You may have this test every year starting at age 56. Flexible sigmoidoscopy or colonoscopy. You may have a sigmoidoscopy every 5 years or a colonoscopy every 10 years starting at age 5. Hepatitis C blood test. Hepatitis B blood test. Sexually transmitted disease (STD) testing. Diabetes screening. This is done by checking your blood sugar (glucose) after you have not eaten for a while (fasting). You may have this done every 1-3 years. Bone density scan. This is done to screen for osteoporosis. You may have this done starting at age 64. Mammogram. This may be done every 1-2 years. Talk to your health care provider about how often you should have regular mammograms. Talk with your health care provider about your test results, treatment options, and if necessary, the need for more tests. Vaccines  Your health care provider may recommend certain vaccines, such as: Influenza vaccine. This is recommended every year. Tetanus, diphtheria, and acellular pertussis (Tdap, Td) vaccine. You may need a Td booster every 10 years. Zoster vaccine. You may  need this after age 82. Pneumococcal 13-valent conjugate (PCV13) vaccine. One dose is recommended after age 52. Pneumococcal polysaccharide (PPSV23) vaccine. One dose is recommended after age 48. Talk to your health care provider about which screenings and vaccines you need and how often you need them. This information is not intended to replace advice given to you by your health care provider. Make sure you discuss any questions you have with your health care provider. Document Released: 07/04/2015 Document Revised: 02/25/2016 Document Reviewed: 04/08/2015 Elsevier Interactive Patient Education  2017 [place] Prevention in the Home Falls can cause injuries. They can happen to people of all ages. There are many things you can do to make your home safe and to help prevent falls. What can I do on the outside of my home? Regularly fix the edges of walkways and driveways and fix any cracks. Remove anything that might make you trip as you walk through a door, such as a raised step or threshold. Trim any bushes or trees on the path to your home. Use bright outdoor lighting. Clear any walking paths of anything that might make someone trip, such as rocks or tools. Regularly check to see if handrails are loose or broken. Make sure that both sides of any steps have handrails. Any raised decks and porches  should have guardrails on the edges. Have any leaves, snow, or ice cleared regularly. Use sand or salt on walking paths during winter. Clean up any spills in your garage right away. This includes oil or grease spills. What can I do in the bathroom? Use night lights. Install grab bars by the toilet and in the tub and shower. Do not use towel bars as grab bars. Use non-skid mats or decals in the tub or shower. If you need to sit down in the shower, use a plastic, non-slip stool. Keep the floor dry. Clean up any water that spills on the floor as soon as it happens. Remove soap buildup in  the tub or shower regularly. Attach bath mats securely with double-sided non-slip rug tape. Do not have throw rugs and other things on the floor that can make you trip. What can I do in the bedroom? Use night lights. Make sure that you have a light by your bed that is easy to reach. Do not use any sheets or blankets that are too big for your bed. They should not hang down onto the floor. Have a firm chair that has side arms. You can use this for support while you get dressed. Do not have throw rugs and other things on the floor that can make you trip. What can I do in the kitchen? Clean up any spills right away. Avoid walking on wet floors. Keep items that you use a lot in easy-to-reach places. If you need to reach something above you, use a strong step stool that has a grab bar. Keep electrical cords out of the way. Do not use floor polish or wax that makes floors slippery. If you must use wax, use non-skid floor wax. Do not have throw rugs and other things on the floor that can make you trip. What can I do with my stairs? Do not leave any items on the stairs. Make sure that there are handrails on both sides of the stairs and use them. Fix handrails that are broken or loose. Make sure that handrails are as long as the stairways. Check any carpeting to make sure that it is firmly attached to the stairs. Fix any carpet that is loose or worn. Avoid having throw rugs at the top or bottom of the stairs. If you do have throw rugs, attach them to the floor with carpet tape. Make sure that you have a light switch at the top of the stairs and the bottom of the stairs. If you do not have them, ask someone to add them for you. What else can I do to help prevent falls? Wear shoes that: Do not have high heels. Have rubber bottoms. Are comfortable and fit you well. Are closed at the toe. Do not wear sandals. If you use a stepladder: Make sure that it is fully opened. Do not climb a closed  stepladder. Make sure that both sides of the stepladder are locked into place. Ask someone to hold it for you, if possible. Clearly mark and make sure that you can see: Any grab bars or handrails. First and last steps. Where the edge of each step is. Use tools that help you move around (mobility aids) if they are needed. These include: Canes. Walkers. Scooters. Crutches. Turn on the lights when you go into a dark area. Replace any light bulbs as soon as they burn out. Set up your furniture so you have a clear path. Avoid moving your furniture around. If any  of your floors are uneven, fix them. If there are any pets around you, be aware of where they are. Review your medicines with your doctor. Some medicines can make you feel dizzy. This can increase your chance of falling. Ask your doctor what other things that you can do to help prevent falls. This information is not intended to replace advice given to you by your health care provider. Make sure you discuss any questions you have with your health care provider. Document Released: 04/03/2009 Document Revised: 11/13/2015 Document Reviewed: 07/12/2014 Elsevier Interactive Patient Education  2017 [employer].

## 2022-05-24 NOTE — Progress Notes (Signed)
Subjective:   Samantha Mcfarland is a 66 y.o. female who presents for Medicare Annual (Subsequent) preventive examination. I connected with  [name] on 05/24/22 by a audio enabled telemedicine application and verified that I am speaking with the correct person using two identifiers.  Patient Location: Home  Provider Location: Home Office  I discussed the limitations of evaluation and management by telemedicine. The patient expressed understanding and agreed to proceed.  Review of Systems     Cardiac Risk Factors include: advanced age (>7mn, >>79women)     Objective:    Today's Vitals   05/24/22 0823  Weight: 122 lb (55.3 kg)  Height: '5\' 6"'$  (1.676 m)   Body mass index is 19.69 kg/m.     05/24/2022    8:28 AM 04/06/2022   10:27 AM 03/02/2022    7:02 PM 03/02/2022    7:00 PM 03/02/2022   11:37 AM 02/01/2022   11:30 PM 01/23/2022   11:00 AM  Advanced Directives  Does Patient Have a Medical Advance Directive? Yes No  No No No No  Type of [place] will        Copy of [place] Chart? No - copy requested        Would patient like information on creating a medical advance directive?  No - Patient declined No - Patient declined No - Patient declined  No - Patient declined No - Patient declined    Current Medications (verified) Outpatient Encounter Medications as of 05/24/2022  Medication Sig   acetaminophen (TYLENOL) 325 MG tablet Take 325 mg by mouth every 6 (six) hours as needed for moderate pain or headache.   anastrozole (ARIMIDEX) 1 MG tablet Take 1 tablet (1 mg total) by mouth daily.   atorvastatin (LIPITOR) 20 MG tablet Take 1 tablet (20 mg total) by mouth daily. (NEEDS TO BE SEEN BEFORE NEXT REFILL)   cholecalciferol (VITAMIN D3) 25 MCG (1000 UNIT) tablet Take 1,000 Units by mouth daily.   diphenhydrAMINE HCl (ALLERGY MEDICATION PO) Take 1 tablet by mouth as needed (allergies).   gabapentin (NEURONTIN) 300 MG  capsule TAKE 1 CAPSULE 2 TIMES A DAY (Patient taking differently: Take 300 mg by mouth 2 (two) times daily.)   loperamide (IMODIUM) 2 MG capsule Take 1 capsule (2 mg total) by mouth as needed for diarrhea or loose stools.   ondansetron (ZOFRAN) 4 MG tablet Take 1 tablet (4 mg total) by mouth every 8 (eight) hours as needed for nausea or vomiting.   vitamin B-12 (CYANOCOBALAMIN) 500 MCG tablet Take 500 mcg by mouth daily.   No facility-administered encounter medications on file as of 05/24/2022.    Allergies (verified) Ibuprofen and Penicillins   History: Past Medical History:  Diagnosis Date   Allergy    Anxiety    Breast cancer ([place]    Depression    Family history of breast cancer    Family history of colon cancer    Family history of pancreatic cancer    Family history of thyroid cancer    GERD (gastroesophageal reflux disease)    Hyperlipidemia    Hypertension    Osteopenia 04/30/2021   Port-A-Cath in place 05/07/2021   Vitamin D insufficiency    Past Surgical History:  Procedure Laterality Date   EXCISION OF SKIN TAG Right 05/27/2021   Procedure: EXCISION OF RIGHT SKIN TAG;  Surgeon: BVirl Cagey MD;  Location: [facility];  Service: General;  Laterality: Right;  MASTECTOMY W/ SENTINEL NODE BIOPSY Left 03/23/2021   Procedure: TOTAL MASTECTOMY WITH SENTINEL LYMPH NODE BIOPSY;  Surgeon: Virl Cagey, MD;  Location: [facility];  Service: General;  Laterality: Left;   NECK SURGERY     OOPHORECTOMY     PORT-A-CATH REMOVAL Right 09/23/2021   Procedure: MINOR REMOVAL PORT-A-CATH;  Surgeon: Virl Cagey, MD;  Location: [facility];  Service: General;  Laterality: Right;   PORTACATH PLACEMENT Right 05/27/2021   Procedure: INSERTION PORT-A-CATH;  Surgeon: Virl Cagey, MD;  Location: [facility];  Service: General;  Laterality: Right;   SALPINGECTOMY     Family History  Problem Relation Age of Onset   Stroke Mother    Alcohol abuse Mother    Diabetes Father    Pancreatic  cancer Father 72   Testicular cancer Father        dx 88s   Pancreatic disease Father    Breast cancer Maternal Aunt        dx 50s-60s   Breast cancer Paternal Aunt 72   Thyroid cancer Paternal Uncle    Breast cancer Maternal Grandmother        dx 80s   Colon cancer Paternal Grandfather        dx 89s   Social History   Socioeconomic History   Marital status: Divorced    Spouse name: Not on file   Number of children: 0   Years of education: Not on file   Highest education level: Not on file  Occupational History   Occupation: retired  Tobacco Use   Smoking status: Former    Packs/day: 0.25    Types: Cigarettes   Smokeless tobacco: Never   Tobacco comments:    1 pack every 3-4 days  Vaping Use   Vaping Use: Never used  Substance and Sexual Activity   Alcohol use: Yes    Alcohol/week: 20.0 standard drinks of alcohol    Types: 20 Cans of beer per week    Comment: 3-4 beers per day   Drug use: Never   Sexual activity: Not Currently    Birth control/protection: Post-menopausal  Other Topics Concern   Not on file  Social History Narrative   Sister lives about 15 minutes away   She reports claustrophobia- not liking closed in spaces   03/26/22 reports she stopped smoking   Does not have internet Her sister Santiago Glad manages her my chart account    Social Determinants of Health   Financial Resource Strain: Low Risk  (05/24/2022)   Overall Financial Resource Strain (CARDIA)    Difficulty of Paying Living Expenses: Not hard at all  Food Insecurity: No Food Insecurity (05/24/2022)   Hunger Vital Sign    Worried About Running Out of Food in the Last Year: Never true    Ran Out of Food in the Last Year: Never true  Transportation Needs: No Transportation Needs (04/15/2022)   PRAPARE - Hydrologist (Medical): No    Lack of Transportation (Non-Medical): No  Physical Activity: Sufficiently Active (05/24/2022)   Exercise Vital Sign    Days of Exercise  per Week: 5 days    Minutes of Exercise per Session: 30 min  Stress: No Stress Concern Present (05/24/2022)   [place]    Feeling of Stress : Not at all  Social Connections: Socially Isolated (05/24/2022)   Social Connection and Isolation Panel [NHANES]    Frequency of Communication with  Friends and Family: More than three times a week    Frequency of Social Gatherings with Friends and Family: More than three times a week    Attends Religious Services: Never    Marine scientist or Organizations: No    Attends Music therapist: Never    Marital Status: Divorced    Tobacco Counseling Counseling given: Not Answered Tobacco comments: 1 pack every 3-4 days   Clinical Intake:  Pre-visit preparation completed: Yes  Pain : No/denies pain     Nutritional Risks: None Diabetes: No  How often do you need to have someone help you when you read instructions, pamphlets, or other written materials from your doctor or pharmacy?: 1 - Never  Diabetic?no   Interpreter Needed?: No  Information entered by :: Jadene Pierini, LPN   Activities of Daily Living    05/24/2022    8:28 AM 03/02/2022    6:45 PM  In your present state of health, do you have any difficulty performing the following activities:  Hearing? 0   Vision? 0   Difficulty concentrating or making decisions? 0   Walking or climbing stairs? 0   Dressing or bathing? 0   Doing errands, shopping? 0 1  Preparing Food and eating ? N   Using the Toilet? N   In the past six months, have you accidently leaked urine? N   Do you have problems with loss of bowel control? N   Managing your Medications? N   Managing your Finances? N   Housekeeping or managing your Housekeeping? N     Patient Care Team: Baruch Gouty, FNP as PCP - General (Family Medicine) Derek Jack, MD as Medical Oncologist (Medical Oncology) Brien Mates, RN as  Oncology Nurse Navigator (Oncology) Barbaraann Faster, RN as [place] any recent [place] you may have received from other than [facility] providers in the past year (date may be approximate).     Assessment:   This is a routine wellness examination for [place].  Hearing/Vision screen Vision Screening - Comments:: Wears rx glasses - up to date with routine eye exams with  Dr.Lee  Dietary issues and exercise activities discussed: Current Exercise Habits: Home exercise routine, Type of exercise: walking, Time (Minutes): 30, Frequency (Times/Week): 5, Weekly Exercise (Minutes/Week): 150, Intensity: Mild, Exercise limited by: orthopedic condition(s)   Goals Addressed             This Visit's Progress    Exercise 150 min/wk Moderate Activity   On track      Depression Screen    05/24/2022    8:26 AM 02/09/2022    2:18 PM 01/05/2022   11:59 AM 05/01/2021    1:23 PM  PHQ 2/9 Scores  PHQ - 2 Score 0 1 0 2  PHQ- 9 Score  6  10    Fall Risk    05/24/2022    8:25 AM 02/09/2022    2:20 PM 01/05/2022   11:59 AM 05/21/2021    8:26 AM 05/01/2021    1:22 PM  Fall Risk   Falls in the past year? 0 0 0 0 0  Number falls in past yr: 0   0   Injury with Fall? 0   0   Risk for fall due to : No Fall Risks   Orthopedic patient;Impaired balance/gait   Follow up Falls prevention discussed   Education provided;Falls prevention discussed     FALL RISK PREVENTION  PERTAINING TO THE HOME:  Any stairs in or around the home? Yes  If so, are there any without handrails? No  Home free of loose throw rugs in walkways, pet beds, electrical cords, etc? Yes  Adequate lighting in your home to reduce risk of falls? Yes   ASSISTIVE DEVICES UTILIZED TO PREVENT FALLS:  Life alert? No  Use of a cane, walker or w/c? Yes  Grab bars in the bathroom? Yes  Shower chair or bench in shower? Yes  Elevated toilet seat or a handicapped toilet? Yes        05/24/2022     8:29 AM 05/21/2021    8:31 AM  6CIT Screen  What Year? 0 points   What month? 0 points 0 points  What time? 0 points 0 points  Count back from 20 0 points 0 points  Months in reverse 0 points 0 points  Repeat phrase 0 points 2 points  Total Score 0 points     Immunizations Immunization History  Administered Date(s) Administered   Fluad Quad(high Dose 65+) 05/01/2021   Moderna Sars-Covid-2 Vaccination 11/20/2019, 04/18/2020    TDAP status: Due, Education has been provided regarding the importance of this vaccine. Advised may receive this vaccine at local pharmacy or Health Dept. Aware to provide a copy of the vaccination record if obtained from local pharmacy or Health Dept. Verbalized acceptance and understanding.  Flu Vaccine status: Due, Education has been provided regarding the importance of this vaccine. Advised may receive this vaccine at local pharmacy or Health Dept. Aware to provide a copy of the vaccination record if obtained from local pharmacy or Health Dept. Verbalized acceptance and understanding.  Pneumococcal vaccine status: Due, Education has been provided regarding the importance of this vaccine. Advised may receive this vaccine at local pharmacy or Health Dept. Aware to provide a copy of the vaccination record if obtained from local pharmacy or Health Dept. Verbalized acceptance and understanding.  Covid-19 vaccine status: Completed vaccines  Qualifies for Shingles Vaccine? Yes   Zostavax completed No   Shingrix Completed?: No.    Education has been provided regarding the importance of this vaccine. Patient has been advised to call insurance company to determine out of pocket expense if they have not yet received this vaccine. Advised may also receive vaccine at local pharmacy or Health Dept. Verbalized acceptance and understanding.  Screening Tests Health Maintenance  Topic Date Due   Pneumonia Vaccine 53+ Years old (1 - PCV) Never done   DTaP/Tdap/Td (1 - Tdap)  Never done   Zoster Vaccines- Shingrix (1 of 2) Never done   COLONOSCOPY (Pts 45-23yr Insurance coverage will need to be confirmed)  Never done   COVID-19 Vaccine (3 - Moderna risk series) 05/16/2020   INFLUENZA VACCINE  09/19/2022 (Originally 01/19/2022)   Hepatitis C Screening  02/10/2023 (Originally 04/08/1974)   MAMMOGRAM  02/26/2023   Medicare Annual Wellness (AWV)  05/25/2023   DEXA SCAN  Completed   HPV VACCINES  Aged Out    Health Maintenance  Health Maintenance Due  Topic Date Due   Pneumonia Vaccine 66 Years old (1 - PCV) Never done   DTaP/Tdap/Td (1 - Tdap) Never done   Zoster Vaccines- Shingrix (1 of 2) Never done   COLONOSCOPY (Pts 45-45yrInsurance coverage will need to be confirmed)  Never done   COVID-19 Vaccine (3 - Moderna risk series) 05/16/2020    Colorectal cancer screening: Referral to GI placed 05/24/2022. Pt aware the office will call re: appt.  Mammogram status: Completed 02/25/2022. Repeat every year  Bone Density status: Completed 11/10/222. Results reflect: Bone density results: OSTEOPENIA. Repeat every 5 years.  Lung Cancer Screening: (Low Dose CT Chest recommended if Age 70-80 years, 30 pack-year currently smoking OR have quit w/in 15years.) does not qualify.   Lung Cancer Screening Referral: n/a  Additional Screening:  Hepatitis C Screening: does not qualify;   Vision Screening: Recommended annual ophthalmology exams for early detection of glaucoma and other disorders of the eye. Is the patient up to date with their annual eye exam?  Yes  Who is the provider or what is the name of the office in which the patient attends annual eye exams? Dr.Lee  If pt is not established with a provider, would they like to be referred to a provider to establish care? No .   Dental Screening: Recommended annual dental exams for proper oral hygiene  Community Resource Referral / Chronic Care Management: CRR required this visit?  No   CCM required this visit?   No      Plan:     I have personally reviewed and noted the following in the patient's chart:   Medical and social history Use of alcohol, tobacco or illicit drugs  Current medications and supplements including opioid prescriptions. Patient is not currently taking opioid prescriptions. Functional ability and status Nutritional status Physical activity Advanced directives List of other physicians Hospitalizations, surgeries, and ER visits in previous 12 months Vitals Screenings to include cognitive, depression, and falls Referrals and appointments  In addition, I have reviewed and discussed with patient certain preventive protocols, quality metrics, and best practice recommendations. A written personalized care plan for preventive services as well as general preventive health recommendations were provided to patient.     Daphane Shepherd, LPN   97/12/4140   Nurse Notes: Due Flu / Pneumonia /TDAp vaccine

## 2022-05-27 ENCOUNTER — Ambulatory Visit: Payer: Self-pay | Admitting: *Deleted

## 2022-05-27 NOTE — Patient Outreach (Signed)
  Care Coordination   Follow Up Visit Note   05/27/2022 Name: Samantha Mcfarland MRN: [id number] DOB: 10-27-1955  Samantha Mcfarland is a 66 y.o. year old female who sees Rakes, Connye Burkitt, FNP for primary care. I spoke with  Samantha Mcfarland by phone today.  What matters to the patients health and wellness today?  Back hurt from sitting for long time reading  Still walking her dog and remaining active  Pending colonoscopy   Goals Addressed               This Visit's Progress     Patient Stated     manage back pain (THN) (pt-stated)   Not on track     Care Coordination Interventions: Reviewed provider established plan for pain management Counseled on the importance of reporting any/all new or changed pain symptoms or management strategies to pain management provider Reviewed with patient prescribed pharmacological and nonpharmacological pain relief strategies Sent in basket message to pcp with patient permission      COMPLETED: manage hearing loss (THN) (pt-stated)   On track     Care Coordination Interventions: Discussed plans with patient for ongoing care management follow up and provided patient with direct contact information for care management team  Confirms she has obtained hearing aides Noted she is not requesting RN CM to repeat information  Goal met         SDOH assessments and interventions completed:  No     Care Coordination Interventions:  Yes, provided   Follow up plan: Follow up call scheduled for 06/28/22    Encounter Outcome:  Pt. Visit Completed   Samantha Dredge L. Lavina Hamman, RN, BSN, [place] Coordinator Office number [phone]

## 2022-05-29 ENCOUNTER — Inpatient Hospital Stay (HOSPITAL_COMMUNITY): Payer: Medicare Other

## 2022-05-29 ENCOUNTER — Inpatient Hospital Stay (HOSPITAL_COMMUNITY)
Admission: EM | Admit: 2022-05-29 | Discharge: 2022-06-01 | DRG: 641 | Disposition: A | Discharge status: Home / Self Care | Payer: Medicare Other | Attending: Family Medicine | Admitting: Family Medicine

## 2022-05-29 ENCOUNTER — Other Ambulatory Visit: Payer: Self-pay

## 2022-05-29 ENCOUNTER — Encounter (HOSPITAL_COMMUNITY): Payer: Self-pay | Admitting: *Deleted

## 2022-05-29 ENCOUNTER — Telehealth: Payer: Self-pay | Admitting: Internal Medicine

## 2022-05-29 ENCOUNTER — Encounter (HOSPITAL_COMMUNITY): Payer: Self-pay

## 2022-05-29 DIAGNOSIS — I10 Essential (primary) hypertension: Secondary | ICD-10-CM | POA: Diagnosis not present

## 2022-05-29 DIAGNOSIS — R9431 Abnormal electrocardiogram [ECG] [EKG]: Secondary | ICD-10-CM | POA: Diagnosis not present

## 2022-05-29 DIAGNOSIS — R531 Weakness: Secondary | ICD-10-CM | POA: Diagnosis not present

## 2022-05-29 DIAGNOSIS — Z823 Family history of stroke: Secondary | ICD-10-CM | POA: Diagnosis not present

## 2022-05-29 DIAGNOSIS — Z811 Family history of alcohol abuse and dependence: Secondary | ICD-10-CM | POA: Diagnosis not present

## 2022-05-29 DIAGNOSIS — Z1152 Encounter for screening for COVID-19: Secondary | ICD-10-CM

## 2022-05-29 DIAGNOSIS — E876 Hypokalemia: Secondary | ICD-10-CM | POA: Diagnosis present

## 2022-05-29 DIAGNOSIS — Z9221 Personal history of antineoplastic chemotherapy: Secondary | ICD-10-CM

## 2022-05-29 DIAGNOSIS — Z880 Allergy status to penicillin: Secondary | ICD-10-CM | POA: Diagnosis not present

## 2022-05-29 DIAGNOSIS — Z800 Family history of malignant neoplasm of digestive organs: Secondary | ICD-10-CM

## 2022-05-29 DIAGNOSIS — Z833 Family history of diabetes mellitus: Secondary | ICD-10-CM | POA: Diagnosis not present

## 2022-05-29 DIAGNOSIS — E871 Hypo-osmolality and hyponatremia: Principal | ICD-10-CM | POA: Diagnosis present

## 2022-05-29 DIAGNOSIS — Z9010 Acquired absence of unspecified breast and nipple: Secondary | ICD-10-CM | POA: Diagnosis not present

## 2022-05-29 DIAGNOSIS — Z79811 Long term (current) use of aromatase inhibitors: Secondary | ICD-10-CM

## 2022-05-29 DIAGNOSIS — R197 Diarrhea, unspecified: Secondary | ICD-10-CM | POA: Diagnosis present

## 2022-05-29 DIAGNOSIS — Z79899 Other long term (current) drug therapy: Secondary | ICD-10-CM

## 2022-05-29 DIAGNOSIS — F411 Generalized anxiety disorder: Secondary | ICD-10-CM | POA: Diagnosis not present

## 2022-05-29 DIAGNOSIS — Z808 Family history of malignant neoplasm of other organs or systems: Secondary | ICD-10-CM | POA: Diagnosis not present

## 2022-05-29 DIAGNOSIS — Z888 Allergy status to other drugs, medicaments and biological substances status: Secondary | ICD-10-CM | POA: Diagnosis not present

## 2022-05-29 DIAGNOSIS — Z87891 Personal history of nicotine dependence: Secondary | ICD-10-CM

## 2022-05-29 DIAGNOSIS — C50912 Malignant neoplasm of unspecified site of left female breast: Secondary | ICD-10-CM | POA: Diagnosis present

## 2022-05-29 DIAGNOSIS — Z23 Encounter for immunization: Secondary | ICD-10-CM | POA: Diagnosis not present

## 2022-05-29 DIAGNOSIS — R519 Headache, unspecified: Secondary | ICD-10-CM | POA: Diagnosis not present

## 2022-05-29 DIAGNOSIS — M6281 Muscle weakness (generalized): Secondary | ICD-10-CM | POA: Diagnosis not present

## 2022-05-29 DIAGNOSIS — Z803 Family history of malignant neoplasm of breast: Secondary | ICD-10-CM

## 2022-05-29 DIAGNOSIS — F331 Major depressive disorder, recurrent, moderate: Secondary | ICD-10-CM | POA: Diagnosis present

## 2022-05-29 DIAGNOSIS — Z789 Other specified health status: Secondary | ICD-10-CM | POA: Diagnosis not present

## 2022-05-29 DIAGNOSIS — F1090 Alcohol use, unspecified, uncomplicated: Secondary | ICD-10-CM | POA: Diagnosis not present

## 2022-05-29 DIAGNOSIS — Z853 Personal history of malignant neoplasm of breast: Secondary | ICD-10-CM

## 2022-05-29 HISTORY — DX: Hypo-osmolality and hyponatremia: E87.1

## 2022-05-29 LAB — COMPREHENSIVE METABOLIC PANEL
ALT: 18 U/L (ref 0–44)
AST: 32 U/L (ref 15–41)
Albumin: 4.7 g/dL (ref 3.5–5.0)
Alkaline Phosphatase: 58 U/L (ref 38–126)
Anion gap: 11 (ref 5–15)
BUN: 6 mg/dL — ABNORMAL LOW (ref 8–23)
CO2: 21 mmol/L — ABNORMAL LOW (ref 22–32)
Calcium: 8.8 mg/dL — ABNORMAL LOW (ref 8.9–10.3)
Chloride: 76 mmol/L — ABNORMAL LOW (ref 98–111)
Creatinine, Ser: 0.47 mg/dL (ref 0.44–1.00)
GFR, Estimated: >60 mL/min (ref >60)
Glucose, Bld: 79 mg/dL (ref 70–99)
Potassium: 3.6 mmol/L (ref 3.5–5.1)
Sodium: 108 mmol/L — CL (ref 135–145)
Total Bilirubin: 0.7 mg/dL (ref 0.3–1.2)
Total Protein: 7.3 g/dL (ref 6.5–8.1)

## 2022-05-29 LAB — CBC WITH DIFFERENTIAL/PLATELET
Abs Immature Granulocytes: 0.02 10*3/uL (ref 0.00–0.07)
Basophils Absolute: 0 10*3/uL (ref 0.0–0.1)
Basophils Relative: 1 %
Eosinophils Absolute: 0.3 10*3/uL (ref 0.0–0.5)
Eosinophils Relative: 5 %
HCT: 36.6 % (ref 36.0–46.0)
Hemoglobin: 13.7 g/dL (ref 12.0–15.0)
Immature Granulocytes: 0 %
Lymphocytes Relative: 30 %
Lymphs Abs: 1.8 10*3/uL (ref 0.7–4.0)
MCH: 33.2 pg (ref 26.0–34.0)
MCHC: 37.4 g/dL — ABNORMAL HIGH (ref 30.0–36.0)
MCV: 88.6 fL (ref 80.0–100.0)
Monocytes Absolute: 0.7 10*3/uL (ref 0.1–1.0)
Monocytes Relative: 11 %
Neutro Abs: 3.2 10*3/uL (ref 1.7–7.7)
Neutrophils Relative %: 53 %
Platelets: 318 10*3/uL (ref 150–400)
RBC: 4.13 MIL/uL (ref 3.87–5.11)
RDW: 11 % — ABNORMAL LOW (ref 11.5–15.5)
WBC: 6.1 10*3/uL (ref 4.0–10.5)
nRBC: 0 % (ref 0.0–0.2)

## 2022-05-29 LAB — URINALYSIS, ROUTINE W REFLEX MICROSCOPIC
Bilirubin Urine: NEGATIVE
Glucose, UA: NEGATIVE mg/dL
Hgb urine dipstick: NEGATIVE
Ketones, ur: 20 mg/dL — AB
Nitrite: NEGATIVE
Protein, ur: NEGATIVE mg/dL
Specific Gravity, Urine: 1.009 (ref 1.005–1.030)
pH: 7 (ref 5.0–8.0)

## 2022-05-29 LAB — BASIC METABOLIC PANEL
Anion gap: 11 (ref 5–15)
BUN: 5 mg/dL — ABNORMAL LOW (ref 8–23)
CO2: 19 mmol/L — ABNORMAL LOW (ref 22–32)
Calcium: 8.2 mg/dL — ABNORMAL LOW (ref 8.9–10.3)
Chloride: 80 mmol/L — ABNORMAL LOW (ref 98–111)
Creatinine, Ser: 0.45 mg/dL (ref 0.44–1.00)
GFR, Estimated: >60 mL/min (ref >60)
Glucose, Bld: 72 mg/dL (ref 70–99)
Potassium: 3.2 mmol/L — ABNORMAL LOW (ref 3.5–5.1)
Sodium: 110 mmol/L — CL (ref 135–145)

## 2022-05-29 LAB — SODIUM, URINE, RANDOM: Sodium, Ur: 85 mmol/L

## 2022-05-29 LAB — ETHANOL: Alcohol, Ethyl (B): <10 mg/dL (ref <10)

## 2022-05-29 LAB — RESP PANEL BY RT-PCR (FLU A&B, COVID) ARPGX2
Influenza A by PCR: NEGATIVE
Influenza B by PCR: NEGATIVE
SARS Coronavirus 2 by RT PCR: NEGATIVE

## 2022-05-29 LAB — MAGNESIUM: Magnesium: 1.7 mg/dL (ref 1.7–2.4)

## 2022-05-29 LAB — LIPASE, BLOOD: Lipase: 29 U/L (ref 11–51)

## 2022-05-29 MED ORDER — METOCLOPRAMIDE HCL 5 MG/ML IJ SOLN
10.0000 mg | Freq: Once | INTRAMUSCULAR | Status: AC
  Administered 2022-05-29: 10 mg via INTRAVENOUS
  Filled 2022-05-29: qty 2

## 2022-05-29 MED ORDER — FOLIC ACID 1 MG PO TABS
1.0000 mg | ORAL_TABLET | Freq: Every day | ORAL | Status: DC
  Administered 2022-05-30 – 2022-06-01 (×3): 1 mg via ORAL
  Filled 2022-05-29 (×3): qty 1

## 2022-05-29 MED ORDER — THIAMINE MONONITRATE 100 MG PO TABS
100.0000 mg | ORAL_TABLET | Freq: Every day | ORAL | Status: DC
  Administered 2022-05-30 – 2022-06-01 (×3): 100 mg via ORAL
  Filled 2022-05-29 (×3): qty 1

## 2022-05-29 MED ORDER — LORAZEPAM 2 MG/ML IJ SOLN: 0.0000 mg | Freq: Two times a day (BID) | INTRAMUSCULAR | Status: DC

## 2022-05-29 MED ORDER — ENOXAPARIN SODIUM 40 MG/0.4ML IJ SOSY
40.0000 mg | PREFILLED_SYRINGE | INTRAMUSCULAR | Status: DC
  Administered 2022-05-29 – 2022-05-31 (×3): 40 mg via SUBCUTANEOUS
  Filled 2022-05-29 (×3): qty 0.4

## 2022-05-29 MED ORDER — LORAZEPAM 1 MG PO TABS: 0.0000 mg | ORAL_TABLET | Freq: Four times a day (QID) | ORAL | Status: DC

## 2022-05-29 MED ORDER — SODIUM CHLORIDE 0.9 % IV BOLUS
1000.0000 mL | Freq: Once | INTRAVENOUS | Status: AC
  Administered 2022-05-29: 1000 mL via INTRAVENOUS

## 2022-05-29 MED ORDER — SODIUM CHLORIDE 0.9 % IV SOLN: INTRAVENOUS | Status: DC

## 2022-05-29 MED ORDER — LORAZEPAM 2 MG/ML IJ SOLN: 0.0000 mg | Freq: Four times a day (QID) | INTRAMUSCULAR | Status: DC

## 2022-05-29 MED ORDER — KETOROLAC TROMETHAMINE 15 MG/ML IJ SOLN
15.0000 mg | Freq: Once | INTRAMUSCULAR | Status: AC
  Administered 2022-05-29: 15 mg via INTRAVENOUS
  Filled 2022-05-29: qty 1

## 2022-05-29 MED ORDER — LORAZEPAM 2 MG/ML IJ SOLN
1.0000 mg | INTRAMUSCULAR | Status: DC | PRN
  Administered 2022-05-29: 2 mg via INTRAVENOUS
  Filled 2022-05-29: qty 1

## 2022-05-29 MED ORDER — GABAPENTIN 300 MG PO CAPS
300.0000 mg | ORAL_CAPSULE | Freq: Two times a day (BID) | ORAL | Status: DC
  Administered 2022-05-29 – 2022-06-01 (×6): 300 mg via ORAL
  Filled 2022-05-29 (×6): qty 1

## 2022-05-29 MED ORDER — ONDANSETRON HCL 4 MG/2ML IJ SOLN
4.0000 mg | Freq: Once | INTRAMUSCULAR | Status: AC
  Administered 2022-05-29: 4 mg via INTRAVENOUS
  Filled 2022-05-29: qty 2

## 2022-05-29 MED ORDER — LORAZEPAM 1 MG PO TABS: 0.0000 mg | ORAL_TABLET | Freq: Two times a day (BID) | ORAL | Status: DC

## 2022-05-29 MED ORDER — ACETAMINOPHEN 325 MG PO TABS: 650.0000 mg | ORAL_TABLET | Freq: Four times a day (QID) | ORAL | Status: DC | PRN

## 2022-05-29 MED ORDER — ANASTROZOLE 1 MG PO TABS
1.0000 mg | ORAL_TABLET | Freq: Every day | ORAL | Status: DC
  Administered 2022-05-30 – 2022-06-01 (×3): 1 mg via ORAL
  Filled 2022-05-29 (×4): qty 1

## 2022-05-29 MED ORDER — ADULT MULTIVITAMIN W/MINERALS CH
1.0000 | ORAL_TABLET | Freq: Every day | ORAL | Status: DC
  Administered 2022-05-30 – 2022-06-01 (×3): 1 via ORAL
  Filled 2022-05-29 (×3): qty 1

## 2022-05-29 MED ORDER — ACETAMINOPHEN 650 MG RE SUPP: 650.0000 mg | Freq: Four times a day (QID) | RECTAL | Status: DC | PRN

## 2022-05-29 MED ORDER — POTASSIUM CHLORIDE CRYS ER 20 MEQ PO TBCR
40.0000 meq | EXTENDED_RELEASE_TABLET | Freq: Once | ORAL | Status: AC
  Administered 2022-05-29: 40 meq via ORAL
  Filled 2022-05-29: qty 2

## 2022-05-29 MED ORDER — LORAZEPAM 1 MG PO TABS
1.0000 mg | ORAL_TABLET | ORAL | Status: DC | PRN
  Administered 2022-05-30: 1 mg via ORAL
  Administered 2022-05-31: 2 mg via ORAL
  Administered 2022-06-01: 1 mg via ORAL
  Filled 2022-05-29: qty 1
  Filled 2022-05-29: qty 2
  Filled 2022-05-29: qty 1

## 2022-05-29 MED ORDER — MAGNESIUM OXIDE -MG SUPPLEMENT 400 (240 MG) MG PO TABS
400.0000 mg | ORAL_TABLET | Freq: Every day | ORAL | Status: DC
  Administered 2022-05-29: 400 mg via ORAL
  Filled 2022-05-29: qty 1

## 2022-05-29 MED ORDER — POLYETHYLENE GLYCOL 3350 17 G PO PACK: 17.0000 g | PACK | Freq: Every day | ORAL | Status: DC | PRN

## 2022-05-29 MED ORDER — THIAMINE HCL 100 MG/ML IJ SOLN
100.0000 mg | Freq: Every day | INTRAMUSCULAR | Status: DC
  Administered 2022-05-29: 100 mg via INTRAVENOUS
  Filled 2022-05-29: qty 2

## 2022-05-29 NOTE — Assessment & Plan Note (Addendum)
Sodium down to 108, symptomatic with nausea and dizziness.  No mental status changes, no seizures.  Prior history of hyponatremia, this is patient's  4th hospitalization for same this year.  Lowest sodium in the past 113.  She has not required 3% in the past, sodium has improved with just normal saline infusion. -Evaluated by nephrology during last hospitalization-deemed 2/2 SIADH, tea and toast diet. - EDP consulted critical care, recommended against 3%, start normal saline gentle hydration - Na improved to 130; per nephrology patient can discharge home; outpatient follow up with [place].  See PCP in 1 week for BMP check.   - appreciate nephrology consult and assistance

## 2022-05-29 NOTE — Assessment & Plan Note (Signed)
Systolic 428J.  Not on medication.

## 2022-05-29 NOTE — Telephone Encounter (Signed)
D/w [facility] at [place]  Patient currntly stable They ware waiting on repeat Na check - if imprpving then patient wil remain at [facility]. If not, or getting wose she or her team wil call back for transfer to [place] [facility] ([facility] [place])    SIGNATURE    Dr. Brand Males, M.D., F.C.C.P,  Pulmonary and Critical Care Medicine Staff Physician, [place] Director - Interstitial Lung Disease  Program  Medical Director - [place] [facility] [place] [facility] [place], [place], [address]   Pager: [phone], If no answer  -[place] or Try [phone] Telephone (clinical office): [phone] Telephone (research): [phone]  6:33 PM 05/29/2022

## 2022-05-29 NOTE — ED Provider Notes (Signed)
[facility] EMERGENCY DEPARTMENT Provider Note   CSN: [id number] Arrival date & time: 05/29/22  1313     History  Chief Complaint  Patient presents with   Weakness    Samantha Mcfarland is a 66 y.o. female with a past medical history of hypertension, alcohol use disorder, hyponatremia, hypokalemia and hypomagnesemia presenting today with weakness.  She states that this feels the same way as it did when her sodium has gone too low in the past.  She says that she has been nauseated for the past couple of days but has not vomited.  Had 1 episode of diarrhea yesterday.  Does report that she had a "1 drink" yesterday and a couple of the day before.  Denies any alcohol today.  Denies any abdominal pain, dysuria, abdominal cramping or vaginal symptoms.  Also tells me she has not been eating very much because she has not felt well.   Weakness Associated symptoms: diarrhea, headaches, myalgias and nausea   Associated symptoms: no abdominal pain, no chest pain, no dizziness, no shortness of breath and no vomiting        Home Medications Prior to Admission medications   Medication Sig Start Date End Date Taking? Authorizing Provider  acetaminophen (TYLENOL) 325 MG tablet Take 325 mg by mouth every 6 (six) hours as needed for moderate pain or headache.    [provider]  anastrozole (ARIMIDEX) 1 MG tablet Take 1 tablet (1 mg total) by mouth daily. 08/27/21   Derek Jack, MD  atorvastatin (LIPITOR) 20 MG tablet Take 1 tablet (20 mg total) by mouth daily. (NEEDS TO BE SEEN BEFORE NEXT REFILL) 11/11/21   Loman Brooklyn, FNP  cholecalciferol (VITAMIN D3) 25 MCG (1000 UNIT) tablet Take 1,000 Units by mouth daily.    [provider]  diphenhydrAMINE HCl (ALLERGY MEDICATION PO) Take 1 tablet by mouth as needed (allergies).    [provider]  gabapentin (NEURONTIN) 300 MG capsule TAKE 1 CAPSULE 2 TIMES A DAY Patient taking differently: Take 300 mg by mouth 2 (two) times  daily. 02/08/22   Derek Jack, MD  loperamide (IMODIUM) 2 MG capsule Take 1 capsule (2 mg total) by mouth as needed for diarrhea or loose stools. 03/05/22   Shahmehdi, Valeria Batman, MD  ondansetron (ZOFRAN) 4 MG tablet Take 1 tablet (4 mg total) by mouth every 8 (eight) hours as needed for nausea or vomiting. 01/05/22   Sharion Balloon, FNP  vitamin B-12 (CYANOCOBALAMIN) 500 MCG tablet Take 500 mcg by mouth daily.    [provider]      Allergies    Ibuprofen and Penicillins    Review of Systems   Review of Systems  Constitutional:  Positive for appetite change, chills and fatigue.  Respiratory:  Negative for chest tightness and shortness of breath.   Cardiovascular:  Negative for chest pain and palpitations.  Gastrointestinal:  Positive for diarrhea and nausea. Negative for abdominal pain and vomiting.  Musculoskeletal:  Positive for myalgias.  Neurological:  Positive for weakness, light-headedness and headaches. Negative for dizziness and syncope.    Physical Exam Updated Vital Signs BP 135/88 (BP Location: Right Arm)   Pulse 77   Temp 98 F (36.7 C) (Oral)   Resp 16   Ht '5\' 6"'$  (1.676 m)   Wt 56.7 kg   SpO2 98%   BMI 20.18 kg/m  Physical Exam Vitals and nursing note reviewed.  Constitutional:      Appearance: Normal appearance.  HENT:  Head: Normocephalic and atraumatic.     Mouth/Throat:     Mouth: Mucous membranes are dry.     Pharynx: Oropharynx is clear.  Eyes:     General: No scleral icterus.    Conjunctiva/sclera: Conjunctivae normal.  Cardiovascular:     Rate and Rhythm: Normal rate and regular rhythm.  Pulmonary:     Effort: Pulmonary effort is normal. No respiratory distress.     Breath sounds: No wheezing.  Abdominal:     General: Abdomen is flat.     Palpations: Abdomen is soft.     Tenderness: There is no abdominal tenderness.  Musculoskeletal:     Cervical back: Normal range of motion.  Skin:    General: Skin is warm and dry.      Coloration: Skin is pale.     Findings: No rash.  Neurological:     Mental Status: She is alert.  Psychiatric:        Mood and Affect: Mood normal.     ED Results / Procedures / Treatments   Labs (all labs ordered are listed, but only abnormal results are displayed) Labs Reviewed  CBC WITH DIFFERENTIAL/PLATELET - Abnormal; Notable for the following components:      Result Value   MCHC 37.4 (*)    RDW 11.0 (*)    All other components within normal limits  COMPREHENSIVE METABOLIC PANEL - Abnormal; Notable for the following components:   Sodium 108 (*)    Chloride 76 (*)    CO2 21 (*)    BUN 6 (*)    Calcium 8.8 (*)    All other components within normal limits  URINALYSIS, ROUTINE W REFLEX MICROSCOPIC - Abnormal; Notable for the following components:   Ketones, ur 20 (*)    Leukocytes,Ua TRACE (*)    Bacteria, UA MANY (*)    All other components within normal limits  RESP PANEL BY RT-PCR (FLU A&B, COVID) ARPGX2  LIPASE, BLOOD  MAGNESIUM  ETHANOL  OSMOLALITY  OSMOLALITY, URINE  SODIUM, URINE, RANDOM    EKG None  Radiology No results found.  Procedures .Critical Care  Performed by: Rhae Hammock, PA-C Authorized by: Rhae Hammock, PA-C   Critical care provider statement:    Critical care time (minutes):  30   Critical care time was exclusive of:  Separately billable procedures and treating other patients   Critical care was necessary to treat or prevent imminent or life-threatening deterioration of the following conditions:  Dehydration and metabolic crisis   Critical care was time spent personally by me on the following activities:  Development of treatment plan with patient or surrogate, discussions with consultants, discussions with primary provider, evaluation of patient's response to treatment, examination of patient, obtaining history from patient or surrogate, review of old charts, re-evaluation of patient's condition, ordering and review of  laboratory studies and pulse oximetry   I assumed direction of critical care for this patient from another provider in my specialty: no     Care discussed with: admitting provider      Medications Ordered in ED Medications  LORazepam (ATIVAN) injection 0-4 mg ( Intravenous Not Given 05/29/22 1541)    Or  LORazepam (ATIVAN) tablet 0-4 mg ( Oral See Alternative 05/29/22 1541)  LORazepam (ATIVAN) injection 0-4 mg (has no administration in time range)    Or  LORazepam (ATIVAN) tablet 0-4 mg (has no administration in time range)  thiamine (VITAMIN B1) tablet 100 mg ( Oral See Alternative 05/29/22 1545)  Or  thiamine (VITAMIN B1) injection 100 mg (100 mg Intravenous Given 05/29/22 1545)  magnesium oxide (MAG-OX) tablet 400 mg (has no administration in time range)  0.9 %  sodium chloride infusion (has no administration in time range)  sodium chloride 0.9 % bolus 1,000 mL (1,000 mLs Intravenous New Bag/Given 05/29/22 1404)  ondansetron (ZOFRAN) injection 4 mg (4 mg Intravenous Given 05/29/22 1404)  ketorolac (TORADOL) 15 MG/ML injection 15 mg (15 mg Intravenous Given 05/29/22 1404)  metoCLOPramide (REGLAN) injection 10 mg (10 mg Intravenous Given 05/29/22 1545)    ED Course/ Medical Decision Making/ A&P Clinical Course as of 05/29/22 1539  Sat May 29, 2022  1507 Sodium(!!): 108 [MR]  1507 Chloride(!): 76 [MR]    Clinical Course User Index [MR] Jacory Kamel, Cecilio Asper, PA-C                           Medical Decision Making Amount and/or Complexity of Data Reviewed Labs: ordered. Decision-making details documented in ED Course.  Risk OTC drugs. Prescription drug management. Decision regarding hospitalization.   66 year old female presenting with weakness.  History of hyponatremia.  Differential includes but is not limited to electrolyte abnormality, COVID/flu/other URI, dehydration, anemia, hypomagnesemia, hypokalemia.  This is not an exhaustive differential.    Past Medical History /  Co-morbidities / Social History: Alcohol use disorder, electrolyte depletion   Additional history: Per chart review patient was admitted to the hospital for hyponatremia 4x in August and once in September.  Additionally, patient has history of breast cancer.  She had a mastectomy at the end of last year and was treated with chemotherapy.  Last chemotherapy session was in February 2023.  In September she had imaging that showed a concern for liver lesions however she had an MRI in October that showed lesions more consistent with benign hepatic hemangiomas   Physical Exam: Pertinent physical exam findings include Pale and chronically ill-appearing.  Clutching emesis bag Nontender abdomen Dry MM  Lab Tests: I ordered, and personally interpreted labs.  The pertinent results include: Sodium 108 Chloride 76     Cardiac Monitoring:  The patient was maintained on a cardiac monitor.  I viewed and interpreted the cardiac monitored which showed an underlying rhythm of: Normal sinus   Medications: I ordered medication including Toradol, Zofran and IV fluids. Reevaluation of the patient after these medicines showed that the patient improved.    MDM/Disposition: This is a 66 year old female with a past medical history of alcohol use disorder and hyponatremia requiring admission presenting today with concern for hyponatremia.  Reports that she has been nauseated for the past couple of days and had 2 episodes of diarrhea over the past 2 days as well and she says that when this happens she usually is hyponatremic.  Also complaining of a headache.  Lab work revealing of a sodium of 108 and a chloride of 76.  Previously it was thought that she had SIADH and I will not give her any more fluids.  She will be admitted to the hospital for further management.   3:30pm: I spoke with Dr. Denton Brick in person who suggest that the patient needs critical care and ultimately a transfer to [facility].  She will go do a  bedside assessment.  4:20pm: I spoke with Dr. Chase Caller with critical care who gentle sodium repletion and admission to the stepdown unit.  He says hypertonic saline should be started if she has a change in her mental  status.  He is writing a consult note.  Admit to Dr. Denton Brick with [facility]   I discussed this case with my attending physician Dr. Gilford Raid who cosigned this note including patient's presenting symptoms, physical exam, and planned diagnostics and interventions. Attending physician stated agreement with plan or made changes to plan which were implemented.     Final Clinical Impression(s) / ED Diagnoses Final diagnoses:  Hyponatremia    Rx / DC Orders ED Discharge Orders     None         Rhae Hammock, PA-C 05/29/22 1711    Isla Pence, MD 05/30/22 [phone]

## 2022-05-29 NOTE — Assessment & Plan Note (Addendum)
-  repleted Magnesium -Limit QT prolonging medications.

## 2022-05-29 NOTE — Progress Notes (Addendum)
  I am [facility] doc and took cal from ER in [facility]  Alcoholic [place] x 2 days x only 2episode  Has gotten 1L Saline in ER  In ER    05/29/2022    3:40 PM 05/29/2022    3:30 PM 05/29/2022    2:30 PM  Vitals with BMI  Systolic 818 563 149  Diastolic 90 90 92  Pulse 75 75 72    AOx3 No seizure   LABS    PULMONARY No results for input(s): "PHART", "PCO2ART", "PO2ART", "HCO3", "TCO2", "O2SAT" in the last 168 hours.  Invalid input(s): "PCO2", "PO2"  CBC Recent Labs  Lab 05/29/22 1402  HGB 13.7  HCT 36.6  WBC 6.1  PLT 318    COAGULATION No results for input(s): "INR" in the last 168 hours.  CARDIAC  No results for input(s): "TROPONINI" in the last 168 hours. No results for input(s): "PROBNP" in the last 168 hours.   CHEMISTRY Recent Labs  Lab 05/29/22 1402  NA 108*  K 3.6  CL 76*  CO2 21*  GLUCOSE 79  BUN 6*  CREATININE 0.47  CALCIUM 8.8*  MG 1.7   Estimated Creatinine Clearance: 61.9 mL/min (by C-G formula based on SCr of 0.47 mg/dL).   LIVER Recent Labs  Lab 05/29/22 1402  AST 32  ALT 18  ALKPHOS 58  BILITOT 0.7  PROT 7.3  ALBUMIN 4.7     INFECTIOUS No results for input(s): "LATICACIDVEN", "PROCALCITON" in the last 168 hours.   ENDOCRINE CBG (last 3)  No results for input(s): "GLUCAP" in the last 72 hours.       IMAGING x48h  - image(s) personally visualized  -   highlighted in bold No results found.   A Severe hyponatemria - isolated. Likely acute . S/p 1L Sodium saline (never required 3% in past, lowest in past is Na 113) No CNS effect  Plan - first 4h can be 27mq x 4650m  - gentle saline hydration correction g 0.50m64mh - can go to [facility] - progressive/SDU  - wil need Q2h Sodium check - q4h bmet check - 3% saline 100cc bolus via PIV if CNS changes  Consider [facility]  [facility]. MurBrand Males.D., F.C.C.P,  Pulmonary and  Critical Care Medicine Staff Physician, [facility] - Interstitial Lung Disease  Program  Medical Director - [facility] [id number]: [phone] no answer  -> CKennard Try [phone] Telephone (clinical office): [phone] Telephone (research): [phone]  4:21 PM 05/29/2022

## 2022-05-29 NOTE — Assessment & Plan Note (Signed)
Reports she is trying to cut down on alcohol intake, she reports 3-4 beers daily, but reports her last drink was yesterday she drank 2 beers. - Continue CIWA -Thiamine, folate, multivitamins - follow electrolytes and replete

## 2022-05-29 NOTE — Assessment & Plan Note (Addendum)
Fluoxetine was discontinued in the past due to hyponatremia

## 2022-05-29 NOTE — ED Notes (Signed)
Date and time results received: 05/29/22 2008   Test: Na+ Critical Value: 110  Name of Provider Notified: Arlyce Dice, MD

## 2022-05-29 NOTE — H&P (Signed)
History and Physical    Samantha Mcfarland YME:[id number] DOB: 04/01/1956 DOA: 05/29/2022  PCP: Baruch Gouty, FNP   Patient coming from: Home  I have personally briefly reviewed patient's old medical records in [place]  Chief Complaint: Weakness, Dizziness  HPI: Samantha Mcfarland is a 66 y.o. female with medical history significant for  Hyponatremia, breast cancer, HTN, GAD, alcohol abuse. Patient presented to the ED with complaints of generalized weakness, with nausea.  She also reports lightheadedness over the past 2 days.  She reports 2 or 3 episodes of watery stools yesterday.  She reports she is cutting down on alcohol intake as recommended by her providers.  She states she now drinks 3-4 beers a week.  She reports her last drink- 2 beers was yesterday.   She feels symptoms are similar to prior episodes of hyponatremia. She denies drinking a lot of water.  No vomiting.  She denies confusion at this time, but reports she has had that in the past with low sodium.  This is patient's 4th hospitalization this year for same.  Lowest sodium was 113, during last hospitalization-  02/2022.  Nephrology was consulted, hyponatremia was deemed secondary to SIADH, and possibly tea and toast diet. Sodium has improved in the past with normal saline infusion.  ED Course: t max- 98.  Heart rate 68-97.  Respirate rate 14-16.  Blood pressure 150s.  O2 sats greater than 97% on room air. Sodium 108.  Chloride 76.  Potassium 3.6.  1 L bolus given.  EDP consulted Critical Care- recommended against 3% saline for now, start gentle hydration, give 3% saline if mental status changes, check sodium every 2 hours, BMP every 4.  Review of Systems: As per HPI all other systems reviewed and negative.  Past Medical History:  Diagnosis Date   Allergy    Anxiety    Breast cancer ([place])    Depression    Family history of breast cancer    Family history of colon cancer    Family history of pancreatic cancer    Family  history of thyroid cancer    GERD (gastroesophageal reflux disease)    Hyperlipidemia    Hypertension    Hyponatremia    Osteopenia 04/30/2021   Port-A-Cath in place 05/07/2021   Vitamin D insufficiency     Past Surgical History:  Procedure Laterality Date   EXCISION OF SKIN TAG Right 05/27/2021   Procedure: EXCISION OF RIGHT SKIN TAG;  Surgeon: Virl Cagey, MD;  Location: [facility];  Service: General;  Laterality: Right;   MASTECTOMY W/ SENTINEL NODE BIOPSY Left 03/23/2021   Procedure: TOTAL MASTECTOMY WITH SENTINEL LYMPH NODE BIOPSY;  Surgeon: Virl Cagey, MD;  Location: [facility];  Service: General;  Laterality: Left;   NECK SURGERY     OOPHORECTOMY     PORT-A-CATH REMOVAL Right 09/23/2021   Procedure: MINOR REMOVAL PORT-A-CATH;  Surgeon: Virl Cagey, MD;  Location: [facility];  Service: General;  Laterality: Right;   PORTACATH PLACEMENT Right 05/27/2021   Procedure: INSERTION PORT-A-CATH;  Surgeon: Virl Cagey, MD;  Location: [facility];  Service: General;  Laterality: Right;   SALPINGECTOMY       reports that she has quit smoking. Her smoking use included cigarettes. She smoked an average of .25 packs per day. She has never used smokeless tobacco. She reports current alcohol use of about 20.0 standard drinks of alcohol per week. She reports that she does not use drugs.  Allergies  Allergen  Reactions   Ibuprofen Other (See Comments)    "Stomach upset"   Penicillins Rash    Family History  Problem Relation Age of Onset   Stroke Mother    Alcohol abuse Mother    Diabetes Father    Pancreatic cancer Father 49   Testicular cancer Father        dx 43s   Pancreatic disease Father    Breast cancer Maternal Aunt        dx 50s-60s   Breast cancer Paternal Aunt 32   Thyroid cancer Paternal Uncle    Breast cancer Maternal Grandmother        dx 59s   Colon cancer Paternal Grandfather        dx 1s   Prior to Admission medications   Medication Sig Start Date End  Date Taking? Authorizing Provider  acetaminophen (TYLENOL) 325 MG tablet Take 325 mg by mouth every 6 (six) hours as needed for moderate pain or headache.   Yes [provider]  anastrozole (ARIMIDEX) 1 MG tablet Take 1 tablet (1 mg total) by mouth daily. 08/27/21  Yes Derek Jack, MD  atorvastatin (LIPITOR) 20 MG tablet Take 1 tablet (20 mg total) by mouth daily. (NEEDS TO BE SEEN BEFORE NEXT REFILL) 11/11/21  Yes Hendricks Limes F, FNP  cholecalciferol (VITAMIN D3) 25 MCG (1000 UNIT) tablet Take 1,000 Units by mouth daily.   Yes [provider]  diphenhydrAMINE HCl (ALLERGY MEDICATION PO) Take 1 tablet by mouth as needed (allergies).   Yes [provider]  gabapentin (NEURONTIN) 300 MG capsule TAKE 1 CAPSULE 2 TIMES A DAY Patient taking differently: Take 300 mg by mouth 2 (two) times daily. 02/08/22  Yes Derek Jack, MD  loperamide (IMODIUM) 2 MG capsule Take 1 capsule (2 mg total) by mouth as needed for diarrhea or loose stools. 03/05/22  Yes Shahmehdi, Valeria Batman, MD  ondansetron (ZOFRAN) 4 MG tablet Take 1 tablet (4 mg total) by mouth every 8 (eight) hours as needed for nausea or vomiting. 01/05/22  Yes Hawks, Christy A, FNP  vitamin B-12 (CYANOCOBALAMIN) 500 MCG tablet Take 500 mcg by mouth daily.   Yes [provider]    Physical Exam: Vitals:   05/29/22 1530 05/29/22 1540 05/29/22 1600 05/29/22 1637  BP: (!) 151/90 (!) 151/90 (!) 156/86 (!) 158/90  Pulse: 75 75 97 83  Resp: '14  15 15  '$ Temp:    97.8 F (36.6 C)  TempSrc:    Oral  SpO2: 95%  100% 98%  Weight:      Height:        Constitutional: NAD, calm, comfortable Vitals:   05/29/22 1530 05/29/22 1540 05/29/22 1600 05/29/22 1637  BP: (!) 151/90 (!) 151/90 (!) 156/86 (!) 158/90  Pulse: 75 75 97 83  Resp: '14  15 15  '$ Temp:    97.8 F (36.6 C)  TempSrc:    Oral  SpO2: 95%  100% 98%  Weight:      Height:       Eyes: PERRL, lids and conjunctivae normal ENMT: Mucous membranes are  moist.   Neck: normal, supple, no masses, no thyromegaly Respiratory: clear to auscultation bilaterally, no wheezing, no crackles. Normal respiratory effort. No accessory muscle use.  Cardiovascular: Regular rate and rhythm, no murmurs / rubs / gallops. No extremity edema.  Extremities warm. Abdomen: no tenderness, no masses palpated. No hepatosplenomegaly. Bowel sounds positive.  Musculoskeletal: no clubbing / cyanosis. No joint deformity upper and lower extremities.  Skin: no rashes, lesions, ulcers. No induration Neurologic: CN 2-12 grossly intact. Sensation intact, DTR normal. Strength 5/5 in all 4.  Psychiatric: Normal judgment and insight. Alert and oriented x 4. Normal mood.   Labs on Admission: I have personally reviewed following labs and imaging studies  CBC: Recent Labs  Lab 05/29/22 1402  WBC 6.1  NEUTROABS 3.2  HGB 13.7  HCT 36.6  MCV 88.6  PLT 294   Basic Metabolic Panel: Recent Labs  Lab 05/29/22 1402  NA 108*  K 3.6  CL 76*  CO2 21*  GLUCOSE 79  BUN 6*  CREATININE 0.47  CALCIUM 8.8*  MG 1.7   GFR: Estimated Creatinine Clearance: 61.9 mL/min (by C-G formula based on SCr of 0.47 mg/dL). Liver Function Tests: Recent Labs  Lab 05/29/22 1402  AST 32  ALT 18  ALKPHOS 58  BILITOT 0.7  PROT 7.3  ALBUMIN 4.7   Recent Labs  Lab 05/29/22 1402  LIPASE 29   Urine analysis:    Component Value Date/Time   COLORURINE YELLOW 05/29/2022 1505   APPEARANCEUR CLEAR 05/29/2022 1505   LABSPEC 1.009 05/29/2022 1505   PHURINE 7.0 05/29/2022 1505   GLUCOSEU NEGATIVE 05/29/2022 1505   HGBUR NEGATIVE 05/29/2022 1505   BILIRUBINUR NEGATIVE 05/29/2022 1505   KETONESUR 20 (A) 05/29/2022 1505   PROTEINUR NEGATIVE 05/29/2022 1505   NITRITE NEGATIVE 05/29/2022 1505   LEUKOCYTESUR TRACE (A) 05/29/2022 1505    Radiological Exams on Admission: No results found.  EKG: Independently reviewed. Sinus rhythm  Assessment/Plan Principal Problem:    Hyponatremia Active Problems:   Essential hypertension   Alcohol use   History of left breast cancer   Generalized anxiety disorder   Moderate recurrent major depression (HCC)   Prolonged QT interval    Assessment and Plan: * Hyponatremia Sodium down to 108, symptomatic with nausea and dizziness.  No mental status changes, no seizures.  Prior history of hyponatremia, this is patient's  4th hospitalization for same this year.  Lowest sodium in the past 113.  She has not required 3% in the past, sodium has improved with just normal saline infusion. -Evaluated by nephrology during last hospitalization-deemed 2/2 SIADH, tea and toast diet. - EDP consulted critical care, recommended against 3%, start normal saline gentle hydration, -I talked to critical care- Dr. Chase Caller, for now will repeat BMP, if sodium improving can stay here at [facility], otherwise transfer to [facility] pending bed availability under hospitalist service for now.  Recommends against 3% for now, due to risk of [place] myelinolysis that can occur up to a week later.  Give 3%- 100 ml bolus if CNS changes.  Goal-rate of Na rise- 1 mEq/h for the first 4 hours, subsequently reduce rate of increase to 0.3/h. - N/s 100cc/hr  -Sodium every 2h, BMP every 4h - Follow up Urine sodium, and osmolality, and serum osmolality - Pls consult nephrology consultation in a.m. Addendum-repeat sodium now 111, okay to stay here [place] Na. I updated Dr. Chase Caller via secure chat-recommends goal increase 0.42mq/h and if goes faster, give free water, slow rise to decrease risk of myelolysis 1 week from now.  Risk of seizures has dramatically decreased.    Essential hypertension Systolic 1765Y  Not on medication.  Alcohol use Reports she is trying to cut down on alcohol intake, she reports 3-4 beers daily, but reports her last drink was yesterday she drank 2 beers. - CIWA PRN -Thiamine, folate, multivitamins -Potassium 3.6, give  458m K,  check magnesium  Prolonged QT interval Qt- 620. -Check Magnesium -Limit QT prolonging medications.  Generalized anxiety disorder Fluoxetine was discontinued in the past.  History of left breast cancer Follows with Dr. Del[facility], history of stage I- left breast cancer.  -Resume anastrozole.   DVT prophylaxis: Lovenox Code Status: full code Family Communication: None at bedside Disposition Plan: ~ 2 days Consults called: Critical Care Admission status: Inpt, stepdown I certify that at the point of admission it is my clinical judgment that the patient will require inpatient hospital care spanning beyond 2 midnights from the point of admission due to high intensity of service, high risk for further deterioration and high frequency of surveillance required.    Author: Bethena Roys, MD 05/29/2022 7:08 PM  For on call review [email or web address].

## 2022-05-29 NOTE — Assessment & Plan Note (Signed)
Follows with Dr. Delton [facility]es, history of stage I- left breast cancer.  -Resumed anastrozole.

## 2022-05-29 NOTE — ED Triage Notes (Addendum)
Pt states she may have low sodium level again.  Pt with generalized weakness and diarrhea 2 days ago.   Pt is HOH, does not have her hearing aids with her.

## 2022-05-30 DIAGNOSIS — F411 Generalized anxiety disorder: Secondary | ICD-10-CM

## 2022-05-30 LAB — BASIC METABOLIC PANEL
Anion gap: 11 (ref 5–15)
Anion gap: 13 (ref 5–15)
Anion gap: 8 (ref 5–15)
BUN: 5 mg/dL — ABNORMAL LOW (ref 8–23)
BUN: 5 mg/dL — ABNORMAL LOW (ref 8–23)
BUN: 6 mg/dL — ABNORMAL LOW (ref 8–23)
CO2: 19 mmol/L — ABNORMAL LOW (ref 22–32)
CO2: 16 mmol/L — ABNORMAL LOW (ref 22–32)
CO2: 19 mmol/L — ABNORMAL LOW (ref 22–32)
Calcium: 8.2 mg/dL — ABNORMAL LOW (ref 8.9–10.3)
Calcium: 8.2 mg/dL — ABNORMAL LOW (ref 8.9–10.3)
Calcium: 8.2 mg/dL — ABNORMAL LOW (ref 8.9–10.3)
Chloride: 83 mmol/L — ABNORMAL LOW (ref 98–111)
Chloride: 85 mmol/L — ABNORMAL LOW (ref 98–111)
Chloride: 86 mmol/L — ABNORMAL LOW (ref 98–111)
Creatinine, Ser: 0.44 mg/dL (ref 0.44–1.00)
Creatinine, Ser: 0.4 mg/dL — ABNORMAL LOW (ref 0.44–1.00)
Creatinine, Ser: 0.54 mg/dL (ref 0.44–1.00)
GFR, Estimated: >60 mL/min (ref >60)
GFR, Estimated: >60 mL/min (ref >60)
GFR, Estimated: >60 mL/min (ref >60)
Glucose, Bld: 70 mg/dL (ref 70–99)
Glucose, Bld: 55 mg/dL — ABNORMAL LOW (ref 70–99)
Glucose, Bld: 229 mg/dL — ABNORMAL HIGH (ref 70–99)
Potassium: 3.1 mmol/L — ABNORMAL LOW (ref 3.5–5.1)
Potassium: 3.2 mmol/L — ABNORMAL LOW (ref 3.5–5.1)
Potassium: 2.8 mmol/L — ABNORMAL LOW (ref 3.5–5.1)
Sodium: 113 mmol/L — CL (ref 135–145)
Sodium: 114 mmol/L — CL (ref 135–145)
Sodium: 113 mmol/L — CL (ref 135–145)

## 2022-05-30 LAB — BASIC METABOLIC PANEL WITH GFR
Anion gap: 7 (ref 5–15)
BUN: 9 mg/dL (ref 8–23)
CO2: 18 mmol/L — ABNORMAL LOW (ref 22–32)
Calcium: 8.3 mg/dL — ABNORMAL LOW (ref 8.9–10.3)
Chloride: 93 mmol/L — ABNORMAL LOW (ref 98–111)
Creatinine, Ser: 0.72 mg/dL (ref 0.44–1.00)
GFR, Estimated: >60 mL/min
Glucose, Bld: 145 mg/dL — ABNORMAL HIGH (ref 70–99)
Potassium: 3.9 mmol/L (ref 3.5–5.1)
Sodium: 118 mmol/L — CL (ref 135–145)

## 2022-05-30 LAB — SODIUM
Sodium: 111 mmol/L — CL (ref 135–145)
Sodium: 114 mmol/L — CL (ref 135–145)
Sodium: 123 mmol/L — ABNORMAL LOW (ref 135–145)

## 2022-05-30 LAB — OSMOLALITY: Osmolality: 227 mOsm/kg — CL (ref 275–295)

## 2022-05-30 LAB — CBC
HCT: 38 % (ref 36.0–46.0)
Hemoglobin: 14.2 g/dL (ref 12.0–15.0)
MCH: 33.1 pg (ref 26.0–34.0)
MCHC: 37.4 g/dL — ABNORMAL HIGH (ref 30.0–36.0)
MCV: 88.6 fL (ref 80.0–100.0)
Platelets: 320 10*3/uL (ref 150–400)
RBC: 4.29 MIL/uL (ref 3.87–5.11)
RDW: 11.2 % — ABNORMAL LOW (ref 11.5–15.5)
WBC: 5.1 10*3/uL (ref 4.0–10.5)
nRBC: 0 % (ref 0.0–0.2)

## 2022-05-30 LAB — OSMOLALITY, URINE: Osmolality, Ur: 362 mOsm/kg (ref 300–900)

## 2022-05-30 LAB — MRSA NEXT GEN BY PCR, NASAL: MRSA by PCR Next Gen: NOT DETECTED

## 2022-05-30 LAB — HIV ANTIBODY (ROUTINE TESTING W REFLEX): HIV Screen 4th Generation wRfx: NONREACTIVE

## 2022-05-30 MED ORDER — CHLORHEXIDINE GLUCONATE CLOTH 2 % EX PADS
6.0000 | MEDICATED_PAD | Freq: Every day | CUTANEOUS | Status: DC
  Administered 2022-05-30 – 2022-05-31 (×2): 6 via TOPICAL

## 2022-05-30 MED ORDER — INFLUENZA VAC A&B SA ADJ QUAD 0.5 ML IM PRSY
0.5000 mL | PREFILLED_SYRINGE | INTRAMUSCULAR | Status: AC
  Administered 2022-05-31: 0.5 mL via INTRAMUSCULAR
  Filled 2022-05-30: qty 0.5

## 2022-05-30 MED ORDER — POTASSIUM CHLORIDE 20 MEQ PO PACK
40.0000 meq | PACK | Freq: Two times a day (BID) | ORAL | Status: AC
  Administered 2022-05-30 (×2): 40 meq via ORAL
  Filled 2022-05-30 (×2): qty 2

## 2022-05-30 MED ORDER — OXYCODONE-ACETAMINOPHEN 5-325 MG PO TABS
1.0000 | ORAL_TABLET | Freq: Four times a day (QID) | ORAL | Status: DC | PRN
  Administered 2022-05-30 – 2022-05-31 (×4): 1 via ORAL
  Filled 2022-05-30 (×4): qty 1

## 2022-05-30 NOTE — Consult Note (Signed)
[facility]  INPATIENT CONSULTATION  Reason for Consultation: hyponatremia Requesting Provider: Dr. Irwin Brakeman  HPI: Samantha Mcfarland is an 66 y.o. female with HTN, HL, anxiety, depression, h/o breast cancer who is seen for evaluation and management of hyponatremia.    Has h/o recurrent hyponatremia seems both SSRI assoc SIADH and low solute intake have contributed in past.  Serum sodium as low as 113 in the past and improved with isotonic fluids (01/2022).  Chronically runs in the 127-131 range.  01/2022 TSH normal, 01/2022 am cortisol  16.   Presented to [facility] ED yesterday with weakness and diarrhea x 2 days and concern that she was hyponatremic.  Found to have serum sodium 108.  UA SG 1.009, ketones. Urine osm 362, urine sodium 85. Given 1L NS and improved to 111, cont on MIVF 0.9% NS 75/hr and trended to 114 as of 4am, 5:30am but at 9am 113.   K has been 3 and trended down to 2.8 as of 9am today - rec'd po supplement x 1 dose yesterday.  2pm and labs have not been repeated. She is on CIWA for EtOH.   She c/o mild frontal HA but denies any vertigo, confusion, motor difficulty.   No further diarrhea, no emesis.  Ate good lunch - chicken, greens, applesauce.  Denies excessive fluid consumption. Drinking a few beers daily recently.   No UOP recorded but has at least 431m clear yellow in purewick can.  BUN 5, Cr 0.4.    PMH: Past Medical History:  Diagnosis Date   Allergy    Anxiety    Breast cancer ([place]    Depression    Family history of breast cancer    Family history of colon cancer    Family history of pancreatic cancer    Family history of thyroid cancer    GERD (gastroesophageal reflux disease)    Hyperlipidemia    Hypertension    Hyponatremia    Osteopenia 04/30/2021   Port-A-Cath in place 05/07/2021   Vitamin D insufficiency    PSH: Past Surgical History:  Procedure Laterality Date   EXCISION OF SKIN TAG Right 05/27/2021   Procedure: EXCISION OF RIGHT SKIN  TAG;  Surgeon: BVirl Cagey MD;  Location: [facility];  Service: General;  Laterality: Right;   MASTECTOMY W/ SENTINEL NODE BIOPSY Left 03/23/2021   Procedure: TOTAL MASTECTOMY WITH SENTINEL LYMPH NODE BIOPSY;  Surgeon: BVirl Cagey MD;  Location: [facility];  Service: General;  Laterality: Left;   NECK SURGERY     OOPHORECTOMY     PORT-A-CATH REMOVAL Right 09/23/2021   Procedure: MINOR REMOVAL PORT-A-CATH;  Surgeon: BVirl Cagey MD;  Location: [facility];  Service: General;  Laterality: Right;   PORTACATH PLACEMENT Right 05/27/2021   Procedure: INSERTION PORT-A-CATH;  Surgeon: BVirl Cagey MD;  Location: [facility];  Service: General;  Laterality: Right;   SALPINGECTOMY      Past Medical History:  Diagnosis Date   Allergy    Anxiety    Breast cancer ([place]    Depression    Family history of breast cancer    Family history of colon cancer    Family history of pancreatic cancer    Family history of thyroid cancer    GERD (gastroesophageal reflux disease)    Hyperlipidemia    Hypertension    Hyponatremia    Osteopenia 04/30/2021   Port-A-Cath in place 05/07/2021   Vitamin D insufficiency     Medications:  I have reviewed  the patient's current medications.  Medications Prior to Admission  Medication Sig Dispense Refill   acetaminophen (TYLENOL) 325 MG tablet Take 325 mg by mouth every 6 (six) hours as needed for moderate pain or headache.     anastrozole (ARIMIDEX) 1 MG tablet Take 1 tablet (1 mg total) by mouth daily. 90 tablet 4   atorvastatin (LIPITOR) 20 MG tablet Take 1 tablet (20 mg total) by mouth daily. (NEEDS TO BE SEEN BEFORE NEXT REFILL) 30 tablet 0   cholecalciferol (VITAMIN D3) 25 MCG (1000 UNIT) tablet Take 1,000 Units by mouth daily.     diphenhydrAMINE HCl (ALLERGY MEDICATION PO) Take 1 tablet by mouth as needed (allergies).     gabapentin (NEURONTIN) 300 MG capsule TAKE 1 CAPSULE 2 TIMES A DAY (Patient taking differently: Take 300 mg by mouth 2 (two)  times daily.) 60 capsule 3   loperamide (IMODIUM) 2 MG capsule Take 1 capsule (2 mg total) by mouth as needed for diarrhea or loose stools. 30 capsule 0   ondansetron (ZOFRAN) 4 MG tablet Take 1 tablet (4 mg total) by mouth every 8 (eight) hours as needed for nausea or vomiting. 20 tablet 0   vitamin B-12 (CYANOCOBALAMIN) 500 MCG tablet Take 500 mcg by mouth daily.      ALLERGIES:   Allergies  Allergen Reactions   Ibuprofen Other (See Comments)    "Stomach upset"   Penicillins Rash    FAM HX: Family History  Problem Relation Age of Onset   Stroke Mother    Alcohol abuse Mother    Diabetes Father    Pancreatic cancer Father 62   Testicular cancer Father        dx 68s   Pancreatic disease Father    Breast cancer Maternal Aunt        dx 50s-60s   Breast cancer Paternal Aunt 26   Thyroid cancer Paternal Uncle    Breast cancer Maternal Grandmother        dx 105s   Colon cancer Paternal Grandfather        dx 58s    Social History:   reports that she has quit smoking. Her smoking use included cigarettes. She smoked an average of .25 packs per day. She has never used smokeless tobacco. She reports current alcohol use of about 20.0 standard drinks of alcohol per week. She reports that she does not use drugs.  ROS: 12 system ROS neg except per HPI   Blood pressure 119/60, pulse 89, temperature 98.9 F (37.2 C), temperature source Oral, resp. rate (!) 21, height '5\' 6"'$  (1.676 m), weight 56.7 kg, SpO2 98 %. PHYSICAL EXAM: Gen: thin woman who is awake and alert in bed  Eyes:  anicteric, EOMI ENT: MM tacky Neck: no JVD, supple CV:  RRR Abd:  soft, NABS Lungs: clear GU: purewick Extr:  no edema Neuro: nonfocal, fully conversant   Results for orders placed or performed during the hospital encounter of 05/29/22 (from the past 48 hour(s))  Resp Panel by RT-PCR (Flu A&B, Covid) Anterior Nasal Swab     Status: None   Collection Time: 05/29/22  1:45 PM   Specimen: Anterior Nasal  Swab  Result Value Ref Range   SARS Coronavirus 2 by RT PCR NEGATIVE NEGATIVE    Comment: (NOTE) SARS-CoV-2 target nucleic acids are NOT DETECTED.  The SARS-CoV-2 RNA is generally detectable in upper respiratory specimens during the acute phase of infection. The lowest concentration of SARS-CoV-2 viral copies this assay can detect  is 138 copies/mL. A negative result does not preclude SARS-Cov-2 infection and should not be used as the sole basis for treatment or other patient management decisions. A negative result may occur with  improper specimen collection/handling, submission of specimen other than nasopharyngeal swab, presence of viral mutation(s) within the areas targeted by this assay, and inadequate number of viral copies(<138 copies/mL). A negative result must be combined with clinical observations, patient history, and epidemiological information. The expected result is Negative.  Fact Sheet for Patients:  EntrepreneurPulse.com.au  Fact Sheet for Healthcare Providers:  IncredibleEmployment.be  This test is no t yet approved or cleared by the [place] FDA and  has been authorized for detection and/or diagnosis of SARS-CoV-2 by FDA under an Emergency Use Authorization (EUA). This EUA will remain  in effect (meaning this test can be used) for the duration of the COVID-19 declaration under Section 564(b)(1) of the Act, 21 U.S.C.section 360bbb-3(b)(1), unless the authorization is terminated  or revoked sooner.       Influenza A by PCR NEGATIVE NEGATIVE   Influenza B by PCR NEGATIVE NEGATIVE    Comment: (NOTE) The Xpert Xpress SARS-CoV-2/FLU/RSV plus assay is intended as an aid in the diagnosis of influenza from Nasopharyngeal swab specimens and should not be used as a sole basis for treatment. Nasal washings and aspirates are unacceptable for Xpert Xpress SARS-CoV-2/FLU/RSV testing.  Fact Sheet for  Patients: EntrepreneurPulse.com.au  Fact Sheet for Healthcare Providers: IncredibleEmployment.be  This test is not yet approved or cleared by the [place] FDA and has been authorized for detection and/or diagnosis of SARS-CoV-2 by FDA under an Emergency Use Authorization (EUA). This EUA will remain in effect (meaning this test can be used) for the duration of the COVID-19 declaration under Section 564(b)(1) of the Act, 21 U.S.C. section 360bbb-3(b)(1), unless the authorization is terminated or revoked.  Performed at [facility], [address] [place], [place] [address]   CBC with Differential     Status: Abnormal   Collection Time: 05/29/22  2:02 PM  Result Value Ref Range   WBC 6.1 4.0 - 10.5 K/uL   RBC 4.13 3.87 - 5.11 MIL/uL   Hemoglobin 13.7 12.0 - 15.0 g/dL   HCT 36.6 36.0 - 46.0 %   MCV 88.6 80.0 - 100.0 fL   MCH 33.2 26.0 - 34.0 pg   MCHC 37.4 (H) 30.0 - 36.0 g/dL   RDW 11.0 (L) 11.5 - 15.5 %   Platelets 318 150 - 400 K/uL   nRBC 0.0 0.0 - 0.2 %   Neutrophils Relative % 53 %   Neutro Abs 3.2 1.7 - 7.7 K/uL   Lymphocytes Relative 30 %   Lymphs Abs 1.8 0.7 - 4.0 K/uL   Monocytes Relative 11 %   Monocytes Absolute 0.7 0.1 - 1.0 K/uL   Eosinophils Relative 5 %   Eosinophils Absolute 0.3 0.0 - 0.5 K/uL   Basophils Relative 1 %   Basophils Absolute 0.0 0.0 - 0.1 K/uL   Immature Granulocytes 0 %   Abs Immature Granulocytes 0.02 0.00 - 0.07 K/uL    Comment: Performed at [facility], [address], [place], [place] [address]  Comprehensive metabolic panel     Status: Abnormal   Collection Time: 05/29/22  2:02 PM  Result Value Ref Range   Sodium 108 (LL) 135 - 145 mmol/L    Comment: CRITICAL RESULT CALLED TO, READ BACK BY AND VERIFIED WITH: A PETERMAN, BY LBASTON AT 5361, 05/29/22.    Potassium 3.6  3.5 - 5.1 mmol/L   Chloride 76 (L) 98 - 111 mmol/L   CO2 21 (L) 22 - 32 mmol/L   Glucose, Bld 79 70 - 99 mg/dL    Comment:  Glucose reference range applies only to samples taken after fasting for at least 8 hours.   BUN 6 (L) 8 - 23 mg/dL   Creatinine, Ser 0.47 0.44 - 1.00 mg/dL   Calcium 8.8 (L) 8.9 - 10.3 mg/dL   Total Protein 7.3 6.5 - 8.1 g/dL   Albumin 4.7 3.5 - 5.0 g/dL   AST 32 15 - 41 U/L   ALT 18 0 - 44 U/L   Alkaline Phosphatase 58 38 - 126 U/L   Total Bilirubin 0.7 0.3 - 1.2 mg/dL   GFR, Estimated >60 >60 mL/min    Comment: (NOTE) Calculated using the CKD-EPI Creatinine Equation (2021)    Anion gap 11 5 - 15    Comment: Performed at [facility], [address], [place], [place] [address]  Lipase, blood     Status: None   Collection Time: 05/29/22  2:02 PM  Result Value Ref Range   Lipase 29 11 - 51 U/L    Comment: Performed at [facility], [address], [place], [place] [address]  Magnesium     Status: None   Collection Time: 05/29/22  2:02 PM  Result Value Ref Range   Magnesium 1.7 1.7 - 2.4 mg/dL    Comment: Performed at [facility], [address] [place], [place] [address]  Osmolality     Status: Abnormal   Collection Time: 05/29/22  2:58 PM  Result Value Ref Range   Osmolality 227 (LL) 275 - 295 mOsm/kg    Comment: RESULT REPEATED AND VERIFIED CRITICAL RESULT CALLED TO, READ BACK BY AND VERIFIED WITH: JEFF SALBET 0240 05/30/2022 DLB Performed at [facility], [address] [place], [place] [address]   Ethanol     Status: None   Collection Time: 05/29/22  2:59 PM  Result Value Ref Range   Alcohol, Ethyl (B) <10 <10 mg/dL    Comment: (NOTE) Lowest detectable limit for serum alcohol is 10 mg/dL.  For medical purposes only. Performed at [facility], [address] [place], [place] [address]   Urinalysis, Routine w reflex microscopic Urine, Clean Catch     Status: Abnormal   Collection Time: 05/29/22  3:05 PM  Result Value Ref Range   Color, Urine YELLOW YELLOW   APPearance CLEAR CLEAR   Specific Gravity, Urine 1.009 1.005 - 1.030   pH 7.0 5.0 - 8.0   Glucose,  UA NEGATIVE NEGATIVE mg/dL   Hgb urine dipstick NEGATIVE NEGATIVE   Bilirubin Urine NEGATIVE NEGATIVE   Ketones, ur 20 (A) NEGATIVE mg/dL   Protein, ur NEGATIVE NEGATIVE mg/dL   Nitrite NEGATIVE NEGATIVE   Leukocytes,Ua TRACE (A) NEGATIVE   RBC / HPF 0-5 0 - 5 RBC/hpf   WBC, UA 0-5 0 - 5 WBC/hpf   Bacteria, UA MANY (A) NONE SEEN   Squamous Epithelial / LPF 0-5 0 - 5    Comment: Performed at [facility], [address] [place], [place] [address]  Osmolality, urine     Status: None   Collection Time: 05/29/22  3:05 PM  Result Value Ref Range   Osmolality, Ur 362 300 - 900 mOsm/kg    Comment: RESULT REPEATED AND VERIFIED Performed at [facility], [address] [place], [place] [address]   Sodium, urine, random  Status: None   Collection Time: 05/29/22  3:05 PM  Result Value Ref Range   Sodium, Ur 85 mmol/L    Comment: Performed at [facility] [address] [place], [place] [address]  Basic metabolic panel     Status: Abnormal   Collection Time: 05/29/22  5:35 PM  Result Value Ref Range   Sodium 111 (LL) 135 - 145 mmol/L    Comment: CRITICAL VALUE NOTED.  VALUE IS CONSISTENT WITH PREVIOUSLY REPORTED AND CALLED VALUE.   Potassium 3.0 (L) 3.5 - 5.1 mmol/L   Chloride 81 (L) 98 - 111 mmol/L   CO2 19 (L) 22 - 32 mmol/L   Glucose, Bld 74 70 - 99 mg/dL    Comment: Glucose reference range applies only to samples taken after fasting for at least 8 hours.   BUN 5 (L) 8 - 23 mg/dL   Creatinine, Ser 0.41 (L) 0.44 - 1.00 mg/dL   Calcium 8.1 (L) 8.9 - 10.3 mg/dL   GFR, Estimated >60 >60 mL/min    Comment: (NOTE) Calculated using the CKD-EPI Creatinine Equation (2021)    Anion gap 11 5 - 15    Comment: Performed at [facility], [address] [place], [place] [address]  Basic metabolic panel     Status: Abnormal   Collection Time: 05/29/22  8:55 PM  Result Value Ref Range   Sodium 110 (LL) 135 - 145 mmol/L    Comment: CRITICAL RESULT CALLED TO, READ BACK BY AND  VERIFIED WITH: SAPPELT,J @ 2208 ON 05/29/22 BY JUW    Potassium 3.2 (L) 3.5 - 5.1 mmol/L   Chloride 80 (L) 98 - 111 mmol/L   CO2 19 (L) 22 - 32 mmol/L   Glucose, Bld 72 70 - 99 mg/dL    Comment: Glucose reference range applies only to samples taken after fasting for at least 8 hours.   BUN 5 (L) 8 - 23 mg/dL   Creatinine, Ser 0.45 0.44 - 1.00 mg/dL   Calcium 8.2 (L) 8.9 - 10.3 mg/dL   GFR, Estimated >60 >60 mL/min    Comment: (NOTE) Calculated using the CKD-EPI Creatinine Equation (2021)    Anion gap 11 5 - 15    Comment: Performed at [facility], [address] [place], [place] [address]  Sodium     Status: Abnormal   Collection Time: 05/30/22 12:02 AM  Result Value Ref Range   Sodium 111 (LL) 135 - 145 mmol/L    Comment: DELTA CHECK NOTED CRITICAL RESULT CALLED TO, READ BACK BY AND VERIFIED WITH: RUSH,C @ 0027 ON 05/30/22 BY JUW Performed at [facility], [address], [place], [place] [address]   Basic metabolic panel     Status: Abnormal   Collection Time: 05/30/22  1:29 AM  Result Value Ref Range   Sodium 113 (LL) 135 - 145 mmol/L    Comment: DELTA CHECK NOTED CRITICAL RESULT CALLED TO, READ BACK BY AND VERIFIED WITH: ESBOCE,R @ 0216 ON 05/30/22 BY JUW    Potassium 3.1 (L) 3.5 - 5.1 mmol/L   Chloride 83 (L) 98 - 111 mmol/L   CO2 19 (L) 22 - 32 mmol/L   Glucose, Bld 70 70 - 99 mg/dL    Comment: Glucose reference range applies only to samples taken after fasting for at least 8 hours.   BUN 5 (L) 8 - 23 mg/dL   Creatinine, Ser 0.44 0.44 - 1.00 mg/dL   Calcium 8.2 (L) 8.9 - 10.3 mg/dL   GFR,  Estimated >60 >60 mL/min    Comment: (NOTE) Calculated using the CKD-EPI Creatinine Equation (2021)    Anion gap 11 5 - 15    Comment: Performed at [facility], [address] [place], [place] [address]  Basic metabolic panel     Status: Abnormal   Collection Time: 05/30/22  4:00 AM  Result Value Ref Range   Sodium 114 (LL) 135 - 145 mmol/L    Comment: CRITICAL RESULT CALLED  TO, READ BACK BY AND VERIFIED WITH:  E.TINAJERO @ 0636 BY STEPHTR 05/30/22    Potassium 3.2 (L) 3.5 - 5.1 mmol/L   Chloride 85 (L) 98 - 111 mmol/L   CO2 16 (L) 22 - 32 mmol/L   Glucose, Bld 55 (L) 70 - 99 mg/dL    Comment: Glucose reference range applies only to samples taken after fasting for at least 8 hours.   BUN 5 (L) 8 - 23 mg/dL   Creatinine, Ser 0.40 (L) 0.44 - 1.00 mg/dL   Calcium 8.2 (L) 8.9 - 10.3 mg/dL   GFR, Estimated >60 >60 mL/min    Comment: (NOTE) Calculated using the CKD-EPI Creatinine Equation (2021)    Anion gap 13 5 - 15    Comment: Performed at [facility], [address], [place], [place] [address]  CBC     Status: Abnormal   Collection Time: 05/30/22  4:00 AM  Result Value Ref Range   WBC 5.1 4.0 - 10.5 K/uL   RBC 4.29 3.87 - 5.11 MIL/uL   Hemoglobin 14.2 12.0 - 15.0 g/dL   HCT 38.0 36.0 - 46.0 %   MCV 88.6 80.0 - 100.0 fL   MCH 33.1 26.0 - 34.0 pg   MCHC 37.4 (H) 30.0 - 36.0 g/dL    Comment: CORRECTED FOR ICTERUS CORRECTED FOR LIPEMIA CORRECTED FOR INTERFERING SUBSTANCE    RDW 11.2 (L) 11.5 - 15.5 %   Platelets 320 150 - 400 K/uL   nRBC 0.0 0.0 - 0.2 %    Comment: Performed at [facility], [address], [place], [place] [address]  Sodium     Status: Abnormal   Collection Time: 05/30/22  5:27 AM  Result Value Ref Range   Sodium 114 (LL) 135 - 145 mmol/L    Comment: CRITICAL RESULT CALLED TO, READ BACK BY AND VERIFIED WITH:  E.[facility] @ 5956 BY STEPHTR 05/30/22 Performed at [facility], [address] [place], [place] [address]   Basic metabolic panel     Status: Abnormal   Collection Time: 05/30/22  9:03 AM  Result Value Ref Range   Sodium 113 (LL) 135 - 145 mmol/L    Comment: CRITICAL RESULT CALLED TO, READ BACK BY AND VERIFIED WITH:  S. BAHTTERIE @ 1053 BY STEPHTR 05/30/22    Potassium 2.8 (L) 3.5 - 5.1 mmol/L    Comment: CRITICAL RESULT CALLED TO, READ BACK BY AND VERIFIED WITH:  S. BAHTTERIE @ 1053 BY STEPHTR 05/30/22    Chloride  86 (L) 98 - 111 mmol/L   CO2 19 (L) 22 - 32 mmol/L   Glucose, Bld 229 (H) 70 - 99 mg/dL    Comment: Glucose reference range applies only to samples taken after fasting for at least 8 hours.   BUN 6 (L) 8 - 23 mg/dL   Creatinine, Ser 0.54 0.44 - 1.00 mg/dL   Calcium 8.2 (L) 8.9 - 10.3 mg/dL   GFR, Estimated >60 >60 mL/min    Comment: (NOTE) Calculated using the CKD-EPI Creatinine Equation (2021)  Anion gap 8 5 - 15    Comment: Performed at [facility], [address] [place], [place] [address]    CT HEAD WO CONTRAST (5MM)  Result Date: 05/29/2022 CLINICAL DATA:  Generalized weakness, nausea, lightheadedness, headache EXAM: CT HEAD WITHOUT CONTRAST TECHNIQUE: Contiguous axial images were obtained from the base of the skull through the vertex without intravenous contrast. RADIATION DOSE REDUCTION: This exam was performed according to the departmental dose-optimization program which includes automated exposure control, adjustment of the mA and/or kV according to patient size and/or use of iterative reconstruction technique. COMPARISON:  02/02/2022 FINDINGS: Patient motion slightly limits evaluation. Brain: No acute infarct or hemorrhage. Lateral ventricles and midline structures are unremarkable. No acute extra-axial fluid collections. No mass effect. Vascular: No hyperdense vessel or unexpected calcification. Skull: Normal. Negative for fracture or focal lesion. Sinuses/Orbits: No acute finding. Other: None. IMPRESSION: 1. Slightly limited study due to patient motion. No acute intracranial process. Electronically Signed   By: Randa Ngo M.D.   On: 05/29/2022 22:59    Assessment/Plan **hyponatremia:  initially seemed hypovolemic, looks euvolemic now.  Suspect multifactorial - hypovolemia with diarrhea + low solute intake but also concern for SIADH in the past (poss SSRI assoc which she's off now).  Initially improved with isotonic volume now serum sodium stagnant.  Has marked hypokalemia 2.8  which we'll replete and this should help.  Last Na was at 9am, will repeat another BMP now at 2pm and make further rec re: management based on those results - currently on 0.9% at 75cc/hr.  Hasn't required hypertonic saline or vaptan therapy in the past.  Would use fluid restriction for now in case SIADH playing role and encourage good po nutrition.  **Hypokalemia:  repleting now.   **EtOH: on CIWA  **h/o anxiety: off SSRI   **h/o stage 1 breast Ca: mastectomy 2022, s/p chemo, on anastrazole - f/b Dr. Delton Coombes.    Will follow - call with concerns.  Justin Mend 05/30/2022, 1:53 PM

## 2022-05-30 NOTE — Assessment & Plan Note (Signed)
-  exacerbated by chronic alcohol use; pt now off SSRI due to recurrent hyponatremia -pt denies depressive symptoms at this time

## 2022-05-30 NOTE — Hospital Course (Signed)
66 y.o. female with medical history significant for  Hyponatremia, breast cancer, HTN, GAD, alcohol abuse.  Patient presented to the ED with complaints of generalized weakness, with nausea.  She also reports lightheadedness over the past 2 days.  She reports 2 or 3 episodes of watery stools yesterday.  She reports she is cutting down on alcohol intake as recommended by her providers.  She states she now drinks 3-4 beers a week.  She reports her last drink- 2 beers was yesterday.   She feels symptoms are similar to prior episodes of hyponatremia. She denies drinking a lot of water.  No vomiting.  She denies confusion at this time, but reports she has had that in the past with low sodium.   This is patient's 4th hospitalization this year for same.  Lowest sodium was 113, during last hospitalization-  02/2022.  Nephrology was consulted, hyponatremia was deemed secondary to SIADH, and possibly tea and toast diet. Sodium has improved in the past with normal saline infusion.   ED Course: t max- 98.  Heart rate 68-97.  Respirate rate 14-16.  Blood pressure 150s.  O2 sats greater than 97% on room air.  Sodium 108.  Chloride 76.  Potassium 3.6.  1 L bolus given.  EDP consulted Critical Care- recommended against 3% saline for now, start gentle hydration, give 3% saline if mental status changes, check sodium every 2 hours, BMP every 4.

## 2022-05-30 NOTE — Progress Notes (Signed)
PROGRESS NOTE   Samantha Mcfarland  UMP:[id number] DOB: Dec 04, 1955 DOA: 05/29/2022 PCP: Baruch Gouty, FNP   Chief Complaint  Patient presents with   Weakness   Level of care: Stepdown  Brief Admission History:  66 y.o. female with medical history significant for  Hyponatremia, breast cancer, HTN, GAD, alcohol abuse.  Patient presented to the ED with complaints of generalized weakness, with nausea.  She also reports lightheadedness over the past 2 days.  She reports 2 or 3 episodes of watery stools yesterday.  She reports she is cutting down on alcohol intake as recommended by her providers.  She states she now drinks 3-4 beers a week.  She reports her last drink- 2 beers was yesterday.   She feels symptoms are similar to prior episodes of hyponatremia. She denies drinking a lot of water.  No vomiting.  She denies confusion at this time, but reports she has had that in the past with low sodium.   This is patient's 4th hospitalization this year for same.  Lowest sodium was 113, during last hospitalization-  02/2022.  Nephrology was consulted, hyponatremia was deemed secondary to SIADH, and possibly tea and toast diet. Sodium has improved in the past with normal saline infusion.   ED Course: t max- 98.  Heart rate 68-97.  Respirate rate 14-16.  Blood pressure 150s.  O2 sats greater than 97% on room air.  Sodium 108.  Chloride 76.  Potassium 3.6.  1 L bolus given.  EDP consulted Critical Care- recommended against 3% saline for now, start gentle hydration, give 3% saline if mental status changes, check sodium every 2 hours, BMP every 4.   Assessment and Plan: * Hyponatremia Sodium down to 108, symptomatic with nausea and dizziness.  No mental status changes, no seizures.  Prior history of hyponatremia, this is patient's  4th hospitalization for same this year.  Lowest sodium in the past 113.  She has not required 3% in the past, sodium has improved with just normal saline infusion. -Evaluated by nephrology  during last hospitalization-deemed 2/2 SIADH, tea and toast diet. - EDP consulted critical care, recommended against 3%, start normal saline gentle hydration - Na improving slowly on NS 75 cc per hour  - appreciate nephrology consult Dr. Johnney Ou     Prolonged QT interval Qt- 620. -Check Magnesium -Limit QT prolonging medications.  Alcohol use Reports she is trying to cut down on alcohol intake, she reports 3-4 beers daily, but reports her last drink was yesterday she drank 2 beers. - Continue CIWA -Thiamine, folate, multivitamins - follow electrolytes and replete   Moderate recurrent major depression ([place]) -exacerbated by chronic alcohol use  Generalized anxiety disorder Fluoxetine was discontinued in the past. Pt now on CIWA (lorazepam)   Essential hypertension Diet controlled per patient; follow   History of left breast cancer Follows with Dr. Delton Coombes, history of stage I- left breast cancer.  -Resume anastrozole.  DVT prophylaxis: enoxaparin Code Status: full  Family Communication:  Disposition: Status is: Inpatient Remains inpatient appropriate because: intensity    Consultants:  Nephrology  Procedures:   Antimicrobials:    Subjective: Pt reports having a severe headache asking for oxycodone and ativan Objective: Vitals:   05/30/22 0600 05/30/22 0730 05/30/22 0900 05/30/22 1100  BP: (!) 150/85  119/60   Pulse: 73  89   Resp: (!) 21  (!) 21   Temp:  98.6 F (37 C)  98.9 F (37.2 C)  TempSrc:  Oral  Oral  SpO2: 98%  98%   Weight:      Height:        Intake/Output Summary (Last 24 hours) at 05/30/2022 1555 Last data filed at 05/30/2022 0831 Gross per 24 hour  Intake 2040.8 ml  Output --  Net 2040.8 ml   Filed Weights   05/29/22 1320  Weight: 56.7 kg   Examination:  General exam: Appears calm and comfortable  Respiratory system: Clear to auscultation. Respiratory effort normal. Cardiovascular system: normal S1 & S2 heard. No JVD, murmurs,  rubs, gallops or clicks. No pedal edema. Gastrointestinal system: Abdomen is nondistended, soft and nontender. No organomegaly or masses felt. Normal bowel sounds heard. Central nervous system: Alert and oriented. No focal neurological deficits. Extremities: Symmetric 5 x 5 power. Skin: No rashes, lesions or ulcers. Psychiatry: Judgement and insight appear normal. Mood & affect appropriate.   Data Reviewed: I have personally reviewed following labs and imaging studies  CBC: Recent Labs  Lab 05/29/22 1402 05/30/22 0400  WBC 6.1 5.1  NEUTROABS 3.2  --   HGB 13.7 14.2  HCT 36.6 38.0  MCV 88.6 88.6  PLT 318 664    Basic Metabolic Panel: Recent Labs  Lab 05/29/22 1402 05/29/22 1735 05/29/22 2055 05/30/22 0002 05/30/22 0129 05/30/22 0400 05/30/22 0527 05/30/22 0903 05/30/22 1443  NA 108*   < > 110*   < > 113* 114* 114* 113* 115*  K 3.6   < > 3.2*  --  3.1* 3.2*  --  2.8* 3.0*  CL 76*   < > 80*  --  83* 85*  --  86* 87*  CO2 21*   < > 19*  --  19* 16*  --  19* 19*  GLUCOSE 79   < > 72  --  70 55*  --  229* 134*  BUN 6*   < > 5*  --  5* 5*  --  6* 8  CREATININE 0.47   < > 0.45  --  0.44 0.40*  --  0.54 0.54  CALCIUM 8.8*   < > 8.2*  --  8.2* 8.2*  --  8.2* 8.2*  MG 1.7  --   --   --   --   --   --   --   --    < > = values in this interval not displayed.    CBG: No results for input(s): "GLUCAP" in the last 168 hours.  Recent Results (from the past 240 hour(s))  Resp Panel by RT-PCR (Flu A&B, Covid) Anterior Nasal Swab     Status: None   Collection Time: 05/29/22  1:45 PM   Specimen: Anterior Nasal Swab  Result Value Ref Range Status   SARS Coronavirus 2 by RT PCR NEGATIVE NEGATIVE Final    Comment: (NOTE) SARS-CoV-2 target nucleic acids are NOT DETECTED.  The SARS-CoV-2 RNA is generally detectable in upper respiratory specimens during the acute phase of infection. The lowest concentration of SARS-CoV-2 viral copies this assay can detect is 138 copies/mL. A  negative result does not preclude SARS-Cov-2 infection and should not be used as the sole basis for treatment or other patient management decisions. A negative result may occur with  improper specimen collection/handling, submission of specimen other than nasopharyngeal swab, presence of viral mutation(s) within the areas targeted by this assay, and inadequate number of viral copies(<138 copies/mL). A negative result must be combined with clinical observations, patient history, and epidemiological information. The expected result is Negative.  Fact Sheet for Patients:  EntrepreneurPulse.com.au  Fact Sheet for Healthcare Providers:  IncredibleEmployment.be  This test is no t yet approved or cleared by the [place] FDA and  has been authorized for detection and/or diagnosis of SARS-CoV-2 by FDA under an Emergency Use Authorization (EUA). This EUA will remain  in effect (meaning this test can be used) for the duration of the COVID-19 declaration under Section 564(b)(1) of the Act, 21 U.S.C.section 360bbb-3(b)(1), unless the authorization is terminated  or revoked sooner.       Influenza A by PCR NEGATIVE NEGATIVE Final   Influenza B by PCR NEGATIVE NEGATIVE Final    Comment: (NOTE) The Xpert Xpress SARS-CoV-2/FLU/RSV plus assay is intended as an aid in the diagnosis of influenza from Nasopharyngeal swab specimens and should not be used as a sole basis for treatment. Nasal washings and aspirates are unacceptable for Xpert Xpress SARS-CoV-2/FLU/RSV testing.  Fact Sheet for Patients: EntrepreneurPulse.com.au  Fact Sheet for Healthcare Providers: IncredibleEmployment.be  This test is not yet approved or cleared by the [place] FDA and has been authorized for detection and/or diagnosis of SARS-CoV-2 by FDA under an Emergency Use Authorization (EUA). This EUA will remain in effect (meaning this test can  be used) for the duration of the COVID-19 declaration under Section 564(b)(1) of the Act, 21 U.S.C. section 360bbb-3(b)(1), unless the authorization is terminated or revoked.  Performed at [facility], [address] [place], [place] [address]      Radiology Studies: CT HEAD WO CONTRAST (5MM)  Result Date: 05/29/2022 CLINICAL DATA:  Generalized weakness, nausea, lightheadedness, headache EXAM: CT HEAD WITHOUT CONTRAST TECHNIQUE: Contiguous axial images were obtained from the base of the skull through the vertex without intravenous contrast. RADIATION DOSE REDUCTION: This exam was performed according to the departmental dose-optimization program which includes automated exposure control, adjustment of the mA and/or kV according to patient size and/or use of iterative reconstruction technique. COMPARISON:  02/02/2022 FINDINGS: Patient motion slightly limits evaluation. Brain: No acute infarct or hemorrhage. Lateral ventricles and midline structures are unremarkable. No acute extra-axial fluid collections. No mass effect. Vascular: No hyperdense vessel or unexpected calcification. Skull: Normal. Negative for fracture or focal lesion. Sinuses/Orbits: No acute finding. Other: None. IMPRESSION: 1. Slightly limited study due to patient motion. No acute intracranial process. Electronically Signed   By: Randa Ngo M.D.   On: 05/29/2022 22:59    Scheduled Meds:  anastrozole  1 mg Oral Daily   Chlorhexidine Gluconate Cloth  6 each Topical Q0600   enoxaparin (LOVENOX) injection  40 mg Subcutaneous P10C   folic acid  1 mg Oral Daily   gabapentin  300 mg Oral BID   [START ON 05/31/2022] influenza vaccine adjuvanted  0.5 mL Intramuscular Tomorrow-1000   multivitamin with minerals  1 tablet Oral Daily   potassium chloride  40 mEq Oral BID   thiamine  100 mg Oral Daily   Or   thiamine  100 mg Intravenous Daily   Continuous Infusions:  sodium chloride 75 mL/hr at 05/30/22 0831     LOS: 1 day    Time spent: 22 mins  Tim Wilhide Wynetta Emery, MD How to contact the [place] [facility] Attending or Consulting provider [place] or covering provider during after hours [place], for this patient?  Check the care team in [facility] and look for a) attending/consulting [facility] provider listed and b) the [facility] team listed Log into [email or web address] and use [place]'s universal password to access. If you do not have the password, please contact the hospital operator.  Locate the [facility] provider you are looking for under [facility] and page to a number that you can be directly reached. If you still have difficulty reaching the provider, please page the [facility] (Director on Call) for the Hospitalists listed on amion for assistance.  05/30/2022, 3:55 PM

## 2022-05-31 LAB — BASIC METABOLIC PANEL
Anion gap: 11 (ref 5–15)
Anion gap: 9 (ref 5–15)
Anion gap: 3 — ABNORMAL LOW (ref 5–15)
BUN: 5 mg/dL — ABNORMAL LOW (ref 8–23)
BUN: 8 mg/dL (ref 8–23)
BUN: 6 mg/dL — ABNORMAL LOW (ref 8–23)
CO2: 19 mmol/L — ABNORMAL LOW (ref 22–32)
CO2: 19 mmol/L — ABNORMAL LOW (ref 22–32)
CO2: 21 mmol/L — ABNORMAL LOW (ref 22–32)
Calcium: 8.1 mg/dL — ABNORMAL LOW (ref 8.9–10.3)
Calcium: 8.2 mg/dL — ABNORMAL LOW (ref 8.9–10.3)
Calcium: 8.7 mg/dL — ABNORMAL LOW (ref 8.9–10.3)
Chloride: 81 mmol/L — ABNORMAL LOW (ref 98–111)
Chloride: 87 mmol/L — ABNORMAL LOW (ref 98–111)
Chloride: 103 mmol/L (ref 98–111)
Creatinine, Ser: 0.41 mg/dL — ABNORMAL LOW (ref 0.44–1.00)
Creatinine, Ser: 0.54 mg/dL (ref 0.44–1.00)
Creatinine, Ser: 0.5 mg/dL (ref 0.44–1.00)
GFR, Estimated: >60 mL/min (ref >60)
GFR, Estimated: >60 mL/min (ref >60)
GFR, Estimated: >60 mL/min (ref >60)
Glucose, Bld: 74 mg/dL (ref 70–99)
Glucose, Bld: 134 mg/dL — ABNORMAL HIGH (ref 70–99)
Glucose, Bld: 74 mg/dL (ref 70–99)
Potassium: 3 mmol/L — ABNORMAL LOW (ref 3.5–5.1)
Potassium: 3 mmol/L — ABNORMAL LOW (ref 3.5–5.1)
Potassium: 4.2 mmol/L (ref 3.5–5.1)
Sodium: 111 mmol/L — CL (ref 135–145)
Sodium: 115 mmol/L — CL (ref 135–145)
Sodium: 127 mmol/L — ABNORMAL LOW (ref 135–145)

## 2022-05-31 LAB — SODIUM
Sodium: 127 mmol/L — ABNORMAL LOW (ref 135–145)
Sodium: 124 mmol/L — ABNORMAL LOW (ref 135–145)
Sodium: 125 mmol/L — ABNORMAL LOW (ref 135–145)
Sodium: 125 mmol/L — ABNORMAL LOW (ref 135–145)
Sodium: 124 mmol/L — ABNORMAL LOW (ref 135–145)

## 2022-05-31 LAB — MAGNESIUM: Magnesium: 2 mg/dL (ref 1.7–2.4)

## 2022-05-31 MED ORDER — DEXTROSE 5 % IV SOLN: INTRAVENOUS | Status: DC

## 2022-05-31 NOTE — Progress Notes (Addendum)
Patient ID: Samantha Mcfarland, female   DOB: 1956-06-05, 66 y.o.   MRN: [id number] S: Feels better today. O:BP 117/67   Pulse 75   Temp 99 F (37.2 C) (Oral)   Resp 14   Ht '5\' 6"'$  (1.676 m)   Wt 56.7 kg   SpO2 98%   BMI 20.18 kg/m   Intake/Output Summary (Last 24 hours) at 05/31/2022 0906 Last data filed at 05/31/2022 0745 Gross per 24 hour  Intake 1984.64 ml  Output 3250 ml  Net -1265.36 ml   Intake/Output: I/O last 3 completed shifts: In: 3025.4 [P.O.:440; I.V.:2585.4] Out: 3000 [Urine:3000]  Intake/Output this shift:  Total I/O In: -  Out: 250 [Urine:250] Weight change:  Gen:NAD CVS: RRR Resp: CTA Abd: +BS, soft, NT/ND Ext: no edema  Recent Labs  Lab 05/29/22 1402 05/29/22 1735 05/29/22 2055 05/30/22 0002 05/30/22 0129 05/30/22 0400 05/30/22 0527 05/30/22 0903 05/30/22 1443 05/30/22 1942 05/30/22 2258 05/31/22 0330 05/31/22 0509  NA 108*   < > 110*   < > 113* 114* 114* 113* 115* 118* 123* 127* 127*  K 3.6   < > 3.2*  --  3.1* 3.2*  --  2.8* 3.0* 3.9  --   --  4.2  CL 76*   < > 80*  --  83* 85*  --  86* 87* 93*  --   --  103  CO2 21*   < > 19*  --  19* 16*  --  19* 19* 18*  --   --  21*  GLUCOSE 79   < > 72  --  70 55*  --  229* 134* 145*  --   --  74  BUN 6*   < > 5*  --  5* 5*  --  6* 8 9  --   --  6*  CREATININE 0.47   < > 0.45  --  0.44 0.40*  --  0.54 0.54 0.72  --   --  0.50  ALBUMIN 4.7  --   --   --   --   --   --   --   --   --   --   --   --   CALCIUM 8.8*   < > 8.2*  --  8.2* 8.2*  --  8.2* 8.2* 8.3*  --   --  8.7*  AST 32  --   --   --   --   --   --   --   --   --   --   --   --   ALT 18  --   --   --   --   --   --   --   --   --   --   --   --    < > = values in this interval not displayed.   Liver Function Tests: Recent Labs  Lab 05/29/22 1402  AST 32  ALT 18  ALKPHOS 58  BILITOT 0.7  PROT 7.3  ALBUMIN 4.7   Recent Labs  Lab 05/29/22 1402  LIPASE 29   No results for input(s): "AMMONIA" in the last 168 hours. CBC: Recent Labs   Lab 05/29/22 1402 05/30/22 0400  WBC 6.1 5.1  NEUTROABS 3.2  --   HGB 13.7 14.2  HCT 36.6 38.0  MCV 88.6 88.6  PLT 318 320   Cardiac Enzymes: No results for input(s): "CKTOTAL", "CKMB", "CKMBINDEX", "TROPONINI" in the last 168 hours.  CBG: No results for input(s): "GLUCAP" in the last 168 hours.  Iron Studies: No results for input(s): "IRON", "TIBC", "TRANSFERRIN", "FERRITIN" in the last 72 hours. Studies/Results: CT HEAD WO CONTRAST (5MM)  Result Date: 05/29/2022 CLINICAL DATA:  Generalized weakness, nausea, lightheadedness, headache EXAM: CT HEAD WITHOUT CONTRAST TECHNIQUE: Contiguous axial images were obtained from the base of the skull through the vertex without intravenous contrast. RADIATION DOSE REDUCTION: This exam was performed according to the departmental dose-optimization program which includes automated exposure control, adjustment of the mA and/or kV according to patient size and/or use of iterative reconstruction technique. COMPARISON:  02/02/2022 FINDINGS: Patient motion slightly limits evaluation. Brain: No acute infarct or hemorrhage. Lateral ventricles and midline structures are unremarkable. No acute extra-axial fluid collections. No mass effect. Vascular: No hyperdense vessel or unexpected calcification. Skull: Normal. Negative for fracture or focal lesion. Sinuses/Orbits: No acute finding. Other: None. IMPRESSION: 1. Slightly limited study due to patient motion. No acute intracranial process. Electronically Signed   By: Randa Ngo M.D.   On: 05/29/2022 22:59    anastrozole  1 mg Oral Daily   Chlorhexidine Gluconate Cloth  6 each Topical Q0600   enoxaparin (LOVENOX) injection  40 mg Subcutaneous U27O   folic acid  1 mg Oral Daily   gabapentin  300 mg Oral BID   multivitamin with minerals  1 tablet Oral Daily   thiamine  100 mg Oral Daily   Or   thiamine  100 mg Intravenous Daily    BMET    Component Value Date/Time   NA 127 (L) 05/31/2022 0509   NA 134  02/09/2022 1437   K 4.2 05/31/2022 0509   CL 103 05/31/2022 0509   CO2 21 (L) 05/31/2022 0509   GLUCOSE 74 05/31/2022 0509   BUN 6 (L) 05/31/2022 0509   BUN 5 (L) 02/09/2022 1437   CREATININE 0.50 05/31/2022 0509   CALCIUM 8.7 (L) 05/31/2022 0509   GFRNONAA >60 05/31/2022 0509   CBC    Component Value Date/Time   WBC 5.1 05/30/2022 0400   RBC 4.29 05/30/2022 0400   HGB 14.2 05/30/2022 0400   HGB 10.3 (L) 02/09/2022 1437   HCT 38.0 05/30/2022 0400   HCT 29.8 (L) 02/09/2022 1437   PLT 320 05/30/2022 0400   PLT 358 02/09/2022 1437   MCV 88.6 05/30/2022 0400   MCV 100 (H) 02/09/2022 1437   MCH 33.1 05/30/2022 0400   MCHC 37.4 (H) 05/30/2022 0400   RDW 11.2 (L) 05/30/2022 0400   RDW 14.9 02/09/2022 1437   LYMPHSABS 1.8 05/29/2022 1402   LYMPHSABS 2.0 02/09/2022 1437   MONOABS 0.7 05/29/2022 1402   EOSABS 0.3 05/29/2022 1402   EOSABS 0.4 02/09/2022 1437   BASOSABS 0.0 05/29/2022 1402   BASOSABS 0.1 02/09/2022 1437    Assessment/Plan Hyponatremia:  initially seemed hypovolemic, looks euvolemic now.  Suspect multifactorial - hypovolemia with diarrhea + low solute intake but also concern for SIADH in the past (poss SSRI assoc which she's off now).  Initially improved with isotonic volume from 108 to 115 over the first 24 hours, however improved from 115 to 127 over the past 24 hours.  Due to slight over-correction, she was started on D5W at 100 mL/hr this morning.  Will recheck Na level now and every 4 hours.  Neurologically intact.  Continue with fluid restriction for now in case SIADH playing role and encourage good po nutrition. Hypokalemia:  repleted and stable.    EtOH: on CIWA.  Admits to  2-3 beers but denies daily intake.  h/o anxiety: off SSRI   h/o stage 1 breast Ca: mastectomy 2022, s/p chemo, on anastrazole - f/b Dr. Delton Coombes.    [name], MD [facility]  Repeat Na improved to 124, will stop D5W and cont with fluid restriction and follow  Na levels every 4 hours.

## 2022-05-31 NOTE — Progress Notes (Signed)
PROGRESS NOTE   Samantha Mcfarland  ZOX:[id number] DOB: [birth year] DOA: 05/29/2022 PCP: Baruch Gouty, FNP   Chief Complaint  Patient presents with   Weakness   Level of care: Telemetry  Brief Admission History:  66 y.o. female with medical history significant for  Hyponatremia, breast cancer, HTN, GAD, alcohol abuse.  Patient presented to the ED with complaints of generalized weakness, with nausea.  She also reports lightheadedness over the past 2 days.  She reports 2 or 3 episodes of watery stools yesterday.  She reports she is cutting down on alcohol intake as recommended by her providers.  She states she now drinks 3-4 beers a week.  She reports her last drink- 2 beers was yesterday.   She feels symptoms are similar to prior episodes of hyponatremia. She denies drinking a lot of water.  No vomiting.  She denies confusion at this time, but reports she has had that in the past with low sodium.   This is patient's 4th hospitalization this year for same.  Lowest sodium was 113, during last hospitalization-  02/2022.  Nephrology was consulted, hyponatremia was deemed secondary to SIADH, and possibly tea and toast diet. Sodium has improved in the past with normal saline infusion.   ED Course: t max- 98.  Heart rate 68-97.  Respirate rate 14-16.  Blood pressure 150s.  O2 sats greater than 97% on room air.  Sodium 108.  Chloride 76.  Potassium 3.6.  1 L bolus given.  EDP consulted Critical Care- recommended against 3% saline for now, start gentle hydration, give 3% saline if mental status changes, check sodium every 2 hours, BMP every 4.   Assessment and Plan: * Hyponatremia Sodium down to 108, symptomatic with nausea and dizziness.  No mental status changes, no seizures.  Prior history of hyponatremia, this is patient's  4th hospitalization for same this year.  Lowest sodium in the past 113.  She has not required 3% in the past, sodium has improved with just normal saline infusion. -Evaluated by  nephrology during last hospitalization-deemed 2/2 SIADH, tea and toast diet. - EDP consulted critical care, recommended against 3%, start normal saline gentle hydration - Na improving; nephrology started on D5W 12/11 to prevent overcorrecting and following closely  - appreciate nephrology consult and assistance      Prolonged QT interval Qt- 620. -Check Magnesium -Limit QT prolonging medications.  Alcohol use Reports she is trying to cut down on alcohol intake, she reports 3-4 beers daily, but reports her last drink was yesterday she drank 2 beers. - Continue CIWA -Thiamine, folate, multivitamins - follow electrolytes and replete   Moderate recurrent major depression ([place]) -exacerbated by chronic alcohol use  Generalized anxiety disorder Fluoxetine was discontinued in the past. Pt now on CIWA (lorazepam)   Essential hypertension Diet controlled per patient; follow   History of left breast cancer Follows with Dr. Delton Coombes, history of stage I- left breast cancer.  -Resumed anastrozole.  DVT prophylaxis: enoxaparin Code Status: full  Family Communication:  Disposition: Status is: Inpatient Remains inpatient appropriate because: intensity    Consultants:  Nephrology  Procedures:   Antimicrobials:    Subjective: Pt still having some headache today.    Objective: Vitals:   05/31/22 0600 05/31/22 0803 05/31/22 0950 05/31/22 1325  BP: 117/67  111/70 126/70  Pulse: 75  67 72  Resp: '14  18 18  '$ Temp:  99 F (37.2 C) 98.3 F (36.8 C) 98.4 F (36.9 C)  TempSrc:  Oral Oral Oral  SpO2: 98%  100% 100%  Weight:   54.6 kg   Height:   '5\' 6"'$  (1.676 m)     Intake/Output Summary (Last 24 hours) at 05/31/2022 1329 Last data filed at 05/31/2022 0745 Gross per 24 hour  Intake 1984.64 ml  Output 3250 ml  Net -1265.36 ml   Filed Weights   05/29/22 1320 05/31/22 0950  Weight: 56.7 kg 54.6 kg   Examination:  General exam: Appears calm and comfortable  Respiratory  system: Clear to auscultation. Respiratory effort normal. Cardiovascular system: normal S1 & S2 heard. No JVD, murmurs, rubs, gallops or clicks. No pedal edema. Gastrointestinal system: Abdomen is nondistended, soft and nontender. No organomegaly or masses felt. Normal bowel sounds heard. Central nervous system: Alert and oriented. No focal neurological deficits. Extremities: Symmetric 5 x 5 power. Skin: No rashes, lesions or ulcers. Psychiatry: Judgement and insight appear normal. Mood & affect appropriate.   Data Reviewed: I have personally reviewed following labs and imaging studies  CBC: Recent Labs  Lab 05/29/22 1402 05/30/22 0400  WBC 6.1 5.1  NEUTROABS 3.2  --   HGB 13.7 14.2  HCT 36.6 38.0  MCV 88.6 88.6  PLT 318 025    Basic Metabolic Panel: Recent Labs  Lab 05/29/22 1402 05/29/22 1735 05/30/22 0400 05/30/22 0527 05/30/22 0903 05/30/22 1443 05/30/22 1942 05/30/22 2258 05/31/22 0330 05/31/22 0509 05/31/22 0914 05/31/22 1245  NA 108*   < > 114*   < > 113* 115* 118* 123* 127* 127* 124* 125*  K 3.6   < > 3.2*  --  2.8* 3.0* 3.9  --   --  4.2  --   --   CL 76*   < > 85*  --  86* 87* 93*  --   --  103  --   --   CO2 21*   < > 16*  --  19* 19* 18*  --   --  21*  --   --   GLUCOSE 79   < > 55*  --  229* 134* 145*  --   --  74  --   --   BUN 6*   < > 5*  --  6* 8 9  --   --  6*  --   --   CREATININE 0.47   < > 0.40*  --  0.54 0.54 0.72  --   --  0.50  --   --   CALCIUM 8.8*   < > 8.2*  --  8.2* 8.2* 8.3*  --   --  8.7*  --   --   MG 1.7  --   --   --   --   --   --   --   --  2.0  --   --    < > = values in this interval not displayed.    CBG: No results for input(s): "GLUCAP" in the last 168 hours.  Recent Results (from the past 240 hour(s))  Resp Panel by RT-PCR (Flu A&B, Covid) Anterior Nasal Swab     Status: None   Collection Time: 05/29/22  1:45 PM   Specimen: Anterior Nasal Swab  Result Value Ref Range Status   SARS Coronavirus 2 by RT PCR NEGATIVE  NEGATIVE Final    Comment: (NOTE) SARS-CoV-2 target nucleic acids are NOT DETECTED.  The SARS-CoV-2 RNA is generally detectable in upper respiratory specimens during the acute phase of infection. The lowest concentration of SARS-CoV-2 viral copies  this assay can detect is 138 copies/mL. A negative result does not preclude SARS-Cov-2 infection and should not be used as the sole basis for treatment or other patient management decisions. A negative result may occur with  improper specimen collection/handling, submission of specimen other than nasopharyngeal swab, presence of viral mutation(s) within the areas targeted by this assay, and inadequate number of viral copies(<138 copies/mL). A negative result must be combined with clinical observations, patient history, and epidemiological information. The expected result is Negative.  Fact Sheet for Patients:  EntrepreneurPulse.com.au  Fact Sheet for Healthcare Providers:  IncredibleEmployment.be  This test is no t yet approved or cleared by the [place] FDA and  has been authorized for detection and/or diagnosis of SARS-CoV-2 by FDA under an Emergency Use Authorization (EUA). This EUA will remain  in effect (meaning this test can be used) for the duration of the COVID-19 declaration under Section 564(b)(1) of the Act, 21 U.S.C.section 360bbb-3(b)(1), unless the authorization is terminated  or revoked sooner.       Influenza A by PCR NEGATIVE NEGATIVE Final   Influenza B by PCR NEGATIVE NEGATIVE Final    Comment: (NOTE) The Xpert Xpress SARS-CoV-2/FLU/RSV plus assay is intended as an aid in the diagnosis of influenza from Nasopharyngeal swab specimens and should not be used as a sole basis for treatment. Nasal washings and aspirates are unacceptable for Xpert Xpress SARS-CoV-2/FLU/RSV testing.  Fact Sheet for Patients: EntrepreneurPulse.com.au  Fact Sheet for Healthcare  Providers: IncredibleEmployment.be  This test is not yet approved or cleared by the [place] FDA and has been authorized for detection and/or diagnosis of SARS-CoV-2 by FDA under an Emergency Use Authorization (EUA). This EUA will remain in effect (meaning this test can be used) for the duration of the COVID-19 declaration under Section 564(b)(1) of the Act, 21 U.S.C. section 360bbb-3(b)(1), unless the authorization is terminated or revoked.  Performed at [facility], [address], [place], [place] [address]   MRSA Next Gen by PCR, Nasal     Status: None   Collection Time: 05/30/22  2:00 AM   Specimen: Nasal Mucosa; Nasal Swab  Result Value Ref Range Status   MRSA by PCR Next Gen NOT DETECTED NOT DETECTED Final    Comment: (NOTE) The GeneXpert MRSA Assay (FDA approved for NASAL specimens only), is one component of a comprehensive MRSA colonization surveillance program. It is not intended to diagnose MRSA infection nor to guide or monitor treatment for MRSA infections. Test performance is not FDA approved in patients less than 60 years old. Performed at [facility], [address] [place], [place] [address]      Radiology Studies: CT HEAD WO CONTRAST (5MM)  Result Date: 05/29/2022 CLINICAL DATA:  Generalized weakness, nausea, lightheadedness, headache EXAM: CT HEAD WITHOUT CONTRAST TECHNIQUE: Contiguous axial images were obtained from the base of the skull through the vertex without intravenous contrast. RADIATION DOSE REDUCTION: This exam was performed according to the departmental dose-optimization program which includes automated exposure control, adjustment of the mA and/or kV according to patient size and/or use of iterative reconstruction technique. COMPARISON:  02/02/2022 FINDINGS: Patient motion slightly limits evaluation. Brain: No acute infarct or hemorrhage. Lateral ventricles and midline structures are unremarkable. No acute extra-axial fluid  collections. No mass effect. Vascular: No hyperdense vessel or unexpected calcification. Skull: Normal. Negative for fracture or focal lesion. Sinuses/Orbits: No acute finding. Other: None. IMPRESSION: 1. Slightly limited study due to patient motion. No acute intracranial process. Electronically Signed   By: Legrand Como  Owens Shark M.D.   On: 05/29/2022 22:59    Scheduled Meds:  anastrozole  1 mg Oral Daily   Chlorhexidine Gluconate Cloth  6 each Topical Q0600   enoxaparin (LOVENOX) injection  40 mg Subcutaneous V69I   folic acid  1 mg Oral Daily   gabapentin  300 mg Oral BID   multivitamin with minerals  1 tablet Oral Daily   thiamine  100 mg Oral Daily   Or   thiamine  100 mg Intravenous Daily   Continuous Infusions:     LOS: 2 days   Time spent: 35 mins  Shelbi Vaccaro Wynetta Emery, MD How to contact the [facility] Attending or Consulting provider [place] or covering provider during after hours [place], for this patient?  Check the care team in [facility] and look for a) attending/consulting TRH provider listed and b) the [facility] team listed Log into [email or web address] and use [place] universal password to access. If you do not have the password, please contact the hospital operator. Locate the [facility] provider you are looking for under [facility] and page to a number that you can be directly reached. If you still have difficulty reaching the provider, please page the [facility] (Director on Call) for the Hospitalists listed on amion for assistance.  05/31/2022, 1:29 PM

## 2022-05-31 NOTE — Progress Notes (Addendum)
Nutrition Brief Note  Patient identified on the Malnutrition Screening Tool (MST) Report  Patient is a pleasant 66 yo female with hx of hyponatremia, breast cancer, HTN, GAD, ETOH use. This is her 4th hospitalization for hyponatremia this year.   Wt Readings from Last 15 Encounters:  05/31/22 54.6 kg  05/24/22 55.3 kg  04/06/22 56.6 kg  03/02/22 54.4 kg  02/09/22 52.5 kg  02/01/22 50.9 kg  01/23/22 50.9 kg  01/05/22 53.1 kg  12/03/21 53.3 kg  09/10/21 56.7 kg  08/27/21 56.8 kg  08/06/21 56 kg  07/30/21 56.2 kg  07/09/21 59.4 kg  06/18/21 57.2 kg   Weights reviewed above. Patient reports usual weight ~ 120 lb (54.6 kg). Body mass index is 19.43 kg/m. Patient meets criteria for normal based on current BMI.   No family bedside. Patient is eating at baseline. She is hoping to return home to her little dog by tomorrow.   Current diet order is regular, patient is consuming approximately 50% of meals at this time. Patient denies change in appetite.   Labs and medications reviewed.     Latest Ref Rng & Units 05/31/2022   12:45 PM 05/31/2022    9:14 AM 05/31/2022    5:09 AM  BMP  Glucose 70 - 99 mg/dL   74   BUN 8 - 23 mg/dL   6   Creatinine 0.44 - 1.00 mg/dL   0.50   Sodium 135 - 145 mmol/L 125  124  127   Potassium 3.5 - 5.1 mmol/L   4.2   Chloride 98 - 111 mmol/L   103   CO2 22 - 32 mmol/L   21   Calcium 8.9 - 10.3 mg/dL   8.7     No additional nutrition interventions warranted at this time.   Colman Cater MS,RD,CSG,LDN Contact: Shea Evans

## 2022-05-31 NOTE — TOC Initial Note (Signed)
Transition of Care [facility]) - Initial/Assessment Note    Patient Details  Name: Samantha Mcfarland MRN: [id number] Date of Birth: 05/18/1956  Transition of Care [facility]) CM/SW Contact:    Samantha Mcfarland, [place] Phone Number: 05/31/2022, 11:48 AM  Clinical Narrative:                 Pt is high risk for readmission. CSW spoke with pt to complete assessment. Pt lives alone and is independent in completing ADLs. Pt has a cane to use when needed. Pt states that he sister is able to provide transportation when needed.   TOC consulted for substance use resources. CSW spoke with pt about current alcohol use, pt states that this is not an issue. Pt states that she is not interested in substance use resources at this time. TOC to follow.   Expected Discharge Plan: Home/Self Care Barriers to Discharge: Continued Medical Work up   Patient Goals and CMS Choice Patient states their goals for this hospitalization and ongoing recovery are:: return home CMS Medicare.gov Compare Post Acute Care list provided to:: Patient Choice offered to / list presented to : Patient  Expected Discharge Plan and Services Expected Discharge Plan: Home/Self Care In-house Referral: Clinical Social Work Discharge Planning Services: CM Consult   Living arrangements for the past 2 months: Apartment                                      Prior Living Arrangements/Services Living arrangements for the past 2 months: Apartment Lives with:: Self Patient language and need for interpreter reviewed:: Yes Do you feel safe going back to the place where you live?: Yes      Need for Family Participation in Patient Care: Yes (Comment) Care giver support system in place?: Yes (comment) Current home services: DME (cane, walker, shower chair) Criminal Activity/Legal Involvement Pertinent to Current Situation/Hospitalization: No - Comment as needed  Activities of Daily Living Home Assistive Devices/Equipment: Cane (specify quad or  straight), Hearing aid ADL Screening (condition at time of admission) Patient's cognitive ability adequate to safely complete daily activities?: Yes Is the patient deaf or have difficulty hearing?: Yes Does the patient have difficulty seeing, even when wearing glasses/contacts?: No Does the patient have difficulty concentrating, remembering, or making decisions?: No Patient able to express need for assistance with ADLs?: Yes Does the patient have difficulty dressing or bathing?: No Independently performs ADLs?: Yes (appropriate for developmental age) Does the patient have difficulty walking or climbing stairs?: No Weakness of Legs: Both Weakness of Arms/Hands: Both  Permission Sought/Granted                  Emotional Assessment Appearance:: Appears stated age Attitude/Demeanor/Rapport: Engaged Affect (typically observed): Accepting Orientation: : Oriented to Self, Oriented to Place, Oriented to  Time, Oriented to Situation Alcohol / Substance Use: Not Applicable Psych Involvement: No (comment)  Admission diagnosis:  Hyponatremia [E87.1] Patient Active Problem List   Diagnosis Date Noted   Prolonged QT interval 05/29/2022   Alcohol use 03/02/2022   COVID-19 virus infection 03/02/2022   Hypokalemia 01/24/2022   Nausea vomiting and diarrhea 01/24/2022   Hyponatremia 01/23/2022   Port-A-Cath in place 05/07/2021   History of left breast cancer 05/01/2021   Essential hypertension 05/01/2021   Mixed hyperlipidemia 05/01/2021   Generalized anxiety disorder 05/01/2021   Gastroesophageal reflux disease 05/01/2021   Moderate recurrent major depression ([place]) 05/01/2021  Osteopenia of multiple sites 04/30/2021   Breast cancer, left ([place]) 04/22/2021   Dislocation of cervical facet joint 06/03/2014   PCP:  Samantha Gouty, FNP Pharmacy:   [facility], [place] [address] Phone: [phone] Fax:  [phone]     Social Determinants of Health (SDOH) Interventions Housing Interventions: Intervention Not Indicated  Readmission Risk Interventions    05/31/2022   11:47 AM  Readmission Risk Prevention Plan  Transportation Screening Complete  HRI or Home Care Consult Complete  Social Work Consult for [place] Planning/Counseling Complete  Palliative Care Screening Not Applicable  Medication Review Press photographer) Complete

## 2022-06-01 LAB — BASIC METABOLIC PANEL
Anion gap: 7 (ref 5–15)
BUN: 5 mg/dL — ABNORMAL LOW (ref 8–23)
CO2: 22 mmol/L (ref 22–32)
Calcium: 8.9 mg/dL (ref 8.9–10.3)
Chloride: 100 mmol/L (ref 98–111)
Creatinine, Ser: 0.48 mg/dL (ref 0.44–1.00)
GFR, Estimated: >60 mL/min (ref >60)
Glucose, Bld: 87 mg/dL (ref 70–99)
Potassium: 3.8 mmol/L (ref 3.5–5.1)
Sodium: 129 mmol/L — ABNORMAL LOW (ref 135–145)

## 2022-06-01 LAB — SODIUM
Sodium: 127 mmol/L — ABNORMAL LOW (ref 135–145)
Sodium: 130 mmol/L — ABNORMAL LOW (ref 135–145)

## 2022-06-01 MED ORDER — ADULT MULTIVITAMIN W/MINERALS CH: 1.0000 | ORAL_TABLET | Freq: Every day | ORAL | Status: AC

## 2022-06-01 MED ORDER — VITAMIN B-1 100 MG PO TABS: 100.0000 mg | ORAL_TABLET | Freq: Every day | ORAL | 1 refills | Status: AC

## 2022-06-01 MED ORDER — FOLIC ACID 1 MG PO TABS: 1.0000 mg | ORAL_TABLET | Freq: Every day | ORAL | 1 refills | Status: AC

## 2022-06-01 NOTE — Discharge Summary (Signed)
Physician Discharge Summary  Samantha Mcfarland INO:[id number] DOB: 10-Feb-1956 DOA: 05/29/2022  PCP: Baruch Gouty, FNP Nephrology: [facility]  Admit date: 05/29/2022 Discharge date: 06/01/2022  Admitted From:  Home  Disposition: Home   Recommendations for Outpatient Follow-up:  Follow up with PCP in 1 weeks Please obtain BMP in 1 week Follow up with nephrology in 2 weeks  Discharge Condition: STABLE   CODE STATUS: FULL DIET: regular   Brief Hospitalization Summary: Please see all hospital notes, images, labs for full details of the hospitalization. ADMISSION HPI:  66 y.o. female with medical history significant for  Hyponatremia, breast cancer, HTN, GAD, alcohol abuse.  Patient presented to the ED with complaints of generalized weakness, with nausea.  She also reports lightheadedness over the past 2 days.  She reports 2 or 3 episodes of watery stools yesterday.  She reports she is cutting down on alcohol intake as recommended by her providers.  She states she now drinks 3-4 beers a week.  She reports her last drink- 2 beers was yesterday.   She feels symptoms are similar to prior episodes of hyponatremia. She denies drinking a lot of water.  No vomiting.  She denies confusion at this time, but reports she has had that in the past with low sodium.   This is patient's 4th hospitalization this year for same.  Lowest sodium was 113, during last hospitalization-  02/2022.  Nephrology was consulted, hyponatremia was deemed secondary to SIADH, and possibly tea and toast diet. Sodium has improved in the past with normal saline infusion.   ED Course: t max- 98.  Heart rate 68-97.  Respirate rate 14-16.  Blood pressure 150s.  O2 sats greater than 97% on room air.  Sodium 108.  Chloride 76.  Potassium 3.6.  1 L bolus given.  EDP consulted Critical Care- recommended against 3% saline for now, start gentle hydration, give 3% saline if mental status changes, check sodium every 2 hours, BMP every  4.  Hospital Course by problem    * Hyponatremia Sodium down to 108, symptomatic with nausea and dizziness.  No mental status changes, no seizures.  Prior history of hyponatremia, this is patient's  4th hospitalization for same this year.  Lowest sodium in the past 113.  She has not required 3% in the past, sodium has improved with just normal saline infusion. -Evaluated by nephrology during last hospitalization-deemed 2/2 SIADH, tea and toast diet. - EDP consulted critical care, recommended against 3%, start normal saline gentle hydration - Na improved to 130; per nephrology patient can discharge home; outpatient follow up with [facility].  See PCP in 1 week for BMP check.   - appreciate nephrology consult and assistance      Prolonged QT interval -repleted Magnesium -Limit QT prolonging medications.  Alcohol use Reports she is trying to cut down on alcohol intake, she reports 3-4 beers daily, but reports her last drink was yesterday she drank 2 beers. - CIWA used in hospital, no withdrawal symptoms -Thiamine, folate, multivitamins    Moderate recurrent major depression ([place]) -exacerbated by chronic alcohol use; pt now off SSRI due to recurrent hyponatremia -pt denies depressive symptoms at this time  Generalized anxiety disorder Fluoxetine was discontinued in the past due to hyponatremia   Essential hypertension Diet controlled per patient; follow   History of left breast cancer Follows with Dr. Delton Coombes, history of stage I- left breast cancer.  -Resumed anastrozole.   Discharge Diagnoses:  Principal Problem:   Hyponatremia Active Problems:  History of left breast cancer   Essential hypertension   Generalized anxiety disorder   Moderate recurrent major depression ([place])   Alcohol use   Prolonged QT interval   Discharge Instructions:  Allergies as of 06/01/2022       Reactions   Ibuprofen Other (See Comments)   "Stomach upset"   Penicillins Rash         Medication List     TAKE these medications    acetaminophen 325 MG tablet Commonly known as: TYLENOL Take 325 mg by mouth every 6 (six) hours as needed for moderate pain or headache.   ALLERGY MEDICATION PO Take 1 tablet by mouth as needed (allergies).   anastrozole 1 MG tablet Commonly known as: ARIMIDEX Take 1 tablet (1 mg total) by mouth daily.   atorvastatin 20 MG tablet Commonly known as: LIPITOR Take 1 tablet (20 mg total) by mouth daily. (NEEDS TO BE SEEN BEFORE NEXT REFILL)   cholecalciferol 25 MCG (1000 UNIT) tablet Commonly known as: VITAMIN D3 Take 1,000 Units by mouth daily.   cyanocobalamin 500 MCG tablet Commonly known as: VITAMIN B12 Take 500 mcg by mouth daily.   folic acid 1 MG tablet Commonly known as: FOLVITE Take 1 tablet (1 mg total) by mouth daily. Start taking on: June 02, 2022   gabapentin 300 MG capsule Commonly known as: NEURONTIN TAKE 1 CAPSULE 2 TIMES A DAY What changed: See the new instructions.   loperamide 2 MG capsule Commonly known as: IMODIUM Take 1 capsule (2 mg total) by mouth as needed for diarrhea or loose stools.   multivitamin with minerals Tabs tablet Take 1 tablet by mouth daily. Start taking on: June 02, 2022   ondansetron 4 MG tablet Commonly known as: Zofran Take 1 tablet (4 mg total) by mouth every 8 (eight) hours as needed for nausea or vomiting.   thiamine 100 MG tablet Commonly known as: Vitamin B-1 Take 1 tablet (100 mg total) by mouth daily. Start taking on: June 02, 2022        Follow-up Information     [facility]. Schedule an appointment as soon as possible for a visit in 2 week(s).   Why: Hospital Follow Up        Rakes, Connye Burkitt, FNP. Schedule an appointment as soon as possible for a visit in 1 week(s).   Specialty: Family Medicine Why: Hospital Follow Up Contact information: [place] [address] [phone]                 Allergies  Allergen Reactions   Ibuprofen Other (See Comments)    "Stomach upset"   Penicillins Rash   Allergies as of 06/01/2022       Reactions   Ibuprofen Other (See Comments)   "Stomach upset"   Penicillins Rash        Medication List     TAKE these medications    acetaminophen 325 MG tablet Commonly known as: TYLENOL Take 325 mg by mouth every 6 (six) hours as needed for moderate pain or headache.   ALLERGY MEDICATION PO Take 1 tablet by mouth as needed (allergies).   anastrozole 1 MG tablet Commonly known as: ARIMIDEX Take 1 tablet (1 mg total) by mouth daily.   atorvastatin 20 MG tablet Commonly known as: LIPITOR Take 1 tablet (20 mg total) by mouth daily. (NEEDS TO BE SEEN BEFORE NEXT REFILL)   cholecalciferol 25 MCG (1000 UNIT) tablet Commonly known as: VITAMIN D3  Take 1,000 Units by mouth daily.   cyanocobalamin 500 MCG tablet Commonly known as: VITAMIN B12 Take 500 mcg by mouth daily.   folic acid 1 MG tablet Commonly known as: FOLVITE Take 1 tablet (1 mg total) by mouth daily. Start taking on: June 02, 2022   gabapentin 300 MG capsule Commonly known as: NEURONTIN TAKE 1 CAPSULE 2 TIMES A DAY What changed: See the new instructions.   loperamide 2 MG capsule Commonly known as: IMODIUM Take 1 capsule (2 mg total) by mouth as needed for diarrhea or loose stools.   multivitamin with minerals Tabs tablet Take 1 tablet by mouth daily. Start taking on: June 02, 2022   ondansetron 4 MG tablet Commonly known as: Zofran Take 1 tablet (4 mg total) by mouth every 8 (eight) hours as needed for nausea or vomiting.   thiamine 100 MG tablet Commonly known as: Vitamin B-1 Take 1 tablet (100 mg total) by mouth daily. Start taking on: June 02, 2022        Procedures/Studies: CT HEAD WO CONTRAST (5MM)  Result Date: 05/29/2022 CLINICAL DATA:  Generalized weakness, nausea, lightheadedness, headache EXAM: CT HEAD WITHOUT CONTRAST  TECHNIQUE: Contiguous axial images were obtained from the base of the skull through the vertex without intravenous contrast. RADIATION DOSE REDUCTION: This exam was performed according to the departmental dose-optimization program which includes automated exposure control, adjustment of the mA and/or kV according to patient size and/or use of iterative reconstruction technique. COMPARISON:  02/02/2022 FINDINGS: Patient motion slightly limits evaluation. Brain: No acute infarct or hemorrhage. Lateral ventricles and midline structures are unremarkable. No acute extra-axial fluid collections. No mass effect. Vascular: No hyperdense vessel or unexpected calcification. Skull: Normal. Negative for fracture or focal lesion. Sinuses/Orbits: No acute finding. Other: None. IMPRESSION: 1. Slightly limited study due to patient motion. No acute intracranial process. Electronically Signed   By: Randa Ngo M.D.   On: 05/29/2022 22:59     Subjective: Pt really wants to go home today; no complaints.  Eating well. No confusion or neurological symptoms.   Discharge Exam: Vitals:   05/31/22 2214 06/01/22 0405  BP: 118/71 123/74  Pulse: 69 66  Resp: 16 16  Temp: (!) 97.2 F (36.2 C) (!) 97 F (36.1 C)  SpO2: 100% 99%   Vitals:   05/31/22 0950 05/31/22 1325 05/31/22 2214 06/01/22 0405  BP: 111/70 126/70 118/71 123/74  Pulse: 67 72 69 66  Resp: '18 18 16 16  '$ Temp: 98.3 F (36.8 C) 98.4 F (36.9 C) (!) 97.2 F (36.2 C) (!) 97 F (36.1 C)  TempSrc: Oral Oral    SpO2: 100% 100% 100% 99%  Weight: 54.6 kg     Height: '5\' 6"'$  (1.676 m)      General: Pt is alert, awake, not in acute distress Cardiovascular: normal S1/S2 +, no rubs, no gallops Respiratory: CTA bilaterally, no wheezing, no rhonchi Abdominal: Soft, NT, ND, bowel sounds + Extremities: no edema, no cyanosis   The results of significant diagnostics from this hospitalization (including imaging, microbiology, ancillary and laboratory) are listed  below for reference.     Microbiology: Recent Results (from the past 240 hour(s))  Resp Panel by RT-PCR (Flu A&B, Covid) Anterior Nasal Swab     Status: None   Collection Time: 05/29/22  1:45 PM   Specimen: Anterior Nasal Swab  Result Value Ref Range Status   SARS Coronavirus 2 by RT PCR NEGATIVE NEGATIVE Final    Comment: (NOTE) SARS-CoV-2 target nucleic acids  are NOT DETECTED.  The SARS-CoV-2 RNA is generally detectable in upper respiratory specimens during the acute phase of infection. The lowest concentration of SARS-CoV-2 viral copies this assay can detect is 138 copies/mL. A negative result does not preclude SARS-Cov-2 infection and should not be used as the sole basis for treatment or other patient management decisions. A negative result may occur with  improper specimen collection/handling, submission of specimen other than nasopharyngeal swab, presence of viral mutation(s) within the areas targeted by this assay, and inadequate number of viral copies(<138 copies/mL). A negative result must be combined with clinical observations, patient history, and epidemiological information. The expected result is Negative.  Fact Sheet for Patients:  EntrepreneurPulse.com.au  Fact Sheet for Healthcare Providers:  IncredibleEmployment.be  This test is no t yet approved or cleared by the [place] FDA and  has been authorized for detection and/or diagnosis of SARS-CoV-2 by FDA under an Emergency Use Authorization (EUA). This EUA will remain  in effect (meaning this test can be used) for the duration of the COVID-19 declaration under Section 564(b)(1) of the Act, 21 U.S.C.section 360bbb-3(b)(1), unless the authorization is terminated  or revoked sooner.       Influenza A by PCR NEGATIVE NEGATIVE Final   Influenza B by PCR NEGATIVE NEGATIVE Final    Comment: (NOTE) The Xpert Xpress SARS-CoV-2/FLU/RSV plus assay is intended as an aid in the  diagnosis of influenza from Nasopharyngeal swab specimens and should not be used as a sole basis for treatment. Nasal washings and aspirates are unacceptable for Xpert Xpress SARS-CoV-2/FLU/RSV testing.  Fact Sheet for Patients: EntrepreneurPulse.com.au  Fact Sheet for Healthcare Providers: IncredibleEmployment.be  This test is not yet approved or cleared by the [place] FDA and has been authorized for detection and/or diagnosis of SARS-CoV-2 by FDA under an Emergency Use Authorization (EUA). This EUA will remain in effect (meaning this test can be used) for the duration of the COVID-19 declaration under Section 564(b)(1) of the Act, 21 U.S.C. section 360bbb-3(b)(1), unless the authorization is terminated or revoked.  Performed at [facility], [address], [place], [place] [address]   MRSA Next Gen by PCR, Nasal     Status: None   Collection Time: 05/30/22  2:00 AM   Specimen: Nasal Mucosa; Nasal Swab  Result Value Ref Range Status   MRSA by PCR Next Gen NOT DETECTED NOT DETECTED Final    Comment: (NOTE) The GeneXpert MRSA Assay (FDA approved for NASAL specimens only), is one component of a comprehensive MRSA colonization surveillance program. It is not intended to diagnose MRSA infection nor to guide or monitor treatment for MRSA infections. Test performance is not FDA approved in patients less than 60 years old. Performed at [facility], [address] [place],  [address]      Labs: BNP (last 3 results) No results for input(s): "BNP" in the last 8760 hours. Basic Metabolic Panel: Recent Labs  Lab 05/29/22 1402 05/29/22 1735 05/30/22 0903 05/30/22 1443 05/30/22 1942 05/30/22 2258 05/31/22 0509 05/31/22 0914 05/31/22 1726 05/31/22 2029 06/01/22 0048 06/01/22 0429 06/01/22 0835  NA 108*   < > 113* 115* 118*   < > 127*   < > 125* 124* 127* 129* 130*  K 3.6   < > 2.8* 3.0* 3.9  --  4.2  --   --   --   --  3.8  --    CL 76*   < > 86* 87* 93*  --  103  --   --   --   --  100  --   CO2 21*   < > 19* 19* 18*  --  21*  --   --   --   --  22  --   GLUCOSE 79   < > 229* 134* 145*  --  74  --   --   --   --  87  --   BUN 6*   < > 6* 8 9  --  6*  --   --   --   --  5*  --   CREATININE 0.47   < > 0.54 0.54 0.72  --  0.50  --   --   --   --  0.48  --   CALCIUM 8.8*   < > 8.2* 8.2* 8.3*  --  8.7*  --   --   --   --  8.9  --   MG 1.7  --   --   --   --   --  2.0  --   --   --   --   --   --    < > = values in this interval not displayed.   Liver Function Tests: Recent Labs  Lab 05/29/22 1402  AST 32  ALT 18  ALKPHOS 58  BILITOT 0.7  PROT 7.3  ALBUMIN 4.7   Recent Labs  Lab 05/29/22 1402  LIPASE 29   No results for input(s): "AMMONIA" in the last 168 hours. CBC: Recent Labs  Lab 05/29/22 1402 05/30/22 0400  WBC 6.1 5.1  NEUTROABS 3.2  --   HGB 13.7 14.2  HCT 36.6 38.0  MCV 88.6 88.6  PLT 318 320   Cardiac Enzymes: No results for input(s): "CKTOTAL", "CKMB", "CKMBINDEX", "TROPONINI" in the last 168 hours. BNP: Invalid input(s): "POCBNP" CBG: No results for input(s): "GLUCAP" in the last 168 hours. D-Dimer No results for input(s): "DDIMER" in the last 72 hours. Hgb A1c No results for input(s): "HGBA1C" in the last 72 hours. Lipid Profile No results for input(s): "CHOL", "HDL", "LDLCALC", "TRIG", "CHOLHDL", "LDLDIRECT" in the last 72 hours. Thyroid function studies No results for input(s): "TSH", "T4TOTAL", "T3FREE", "THYROIDAB" in the last 72 hours.  Invalid input(s): "FREET3" Anemia work up No results for input(s): "VITAMINB12", "FOLATE", "FERRITIN", "TIBC", "IRON", "RETICCTPCT" in the last 72 hours. Urinalysis    Component Value Date/Time   COLORURINE YELLOW 05/29/2022 1505   APPEARANCEUR CLEAR 05/29/2022 1505   LABSPEC 1.009 05/29/2022 1505   PHURINE 7.0 05/29/2022 1505   GLUCOSEU NEGATIVE 05/29/2022 1505   HGBUR NEGATIVE 05/29/2022 1505   BILIRUBINUR NEGATIVE 05/29/2022  1505   KETONESUR 20 (A) 05/29/2022 1505   PROTEINUR NEGATIVE 05/29/2022 1505   NITRITE NEGATIVE 05/29/2022 1505   LEUKOCYTESUR TRACE (A) 05/29/2022 1505   Sepsis Labs Recent Labs  Lab 05/29/22 1402 05/30/22 0400  WBC 6.1 5.1   Microbiology Recent Results (from the past 240 hour(s))  Resp Panel by RT-PCR (Flu A&B, Covid) Anterior Nasal Swab     Status: None   Collection Time: 05/29/22  1:45 PM   Specimen: Anterior Nasal Swab  Result Value Ref Range Status   SARS Coronavirus 2 by RT PCR NEGATIVE NEGATIVE Final    Comment: (NOTE) SARS-CoV-2 target nucleic acids are NOT DETECTED.  The SARS-CoV-2 RNA is generally detectable in upper respiratory specimens during the acute phase of infection. The lowest concentration of SARS-CoV-2 viral copies this assay can detect is 138 copies/mL. A negative result does not preclude SARS-Cov-2 infection  and should not be used as the sole basis for treatment or other patient management decisions. A negative result may occur with  improper specimen collection/handling, submission of specimen other than nasopharyngeal swab, presence of viral mutation(s) within the areas targeted by this assay, and inadequate number of viral copies(<138 copies/mL). A negative result must be combined with clinical observations, patient history, and epidemiological information. The expected result is Negative.  Fact Sheet for Patients:  EntrepreneurPulse.com.au  Fact Sheet for Healthcare Providers:  IncredibleEmployment.be  This test is no t yet approved or cleared by the [place] FDA and  has been authorized for detection and/or diagnosis of SARS-CoV-2 by FDA under an Emergency Use Authorization (EUA). This EUA will remain  in effect (meaning this test can be used) for the duration of the COVID-19 declaration under Section 564(b)(1) of the Act, 21 U.S.C.section 360bbb-3(b)(1), unless the authorization is terminated  or  revoked sooner.       Influenza A by PCR NEGATIVE NEGATIVE Final   Influenza B by PCR NEGATIVE NEGATIVE Final    Comment: (NOTE) The Xpert Xpress SARS-CoV-2/FLU/RSV plus assay is intended as an aid in the diagnosis of influenza from Nasopharyngeal swab specimens and should not be used as a sole basis for treatment. Nasal washings and aspirates are unacceptable for Xpert Xpress SARS-CoV-2/FLU/RSV testing.  Fact Sheet for Patients: EntrepreneurPulse.com.au  Fact Sheet for Healthcare Providers: IncredibleEmployment.be  This test is not yet approved or cleared by the [place] FDA and has been authorized for detection and/or diagnosis of SARS-CoV-2 by FDA under an Emergency Use Authorization (EUA). This EUA will remain in effect (meaning this test can be used) for the duration of the COVID-19 declaration under Section 564(b)(1) of the Act, 21 U.S.C. section 360bbb-3(b)(1), unless the authorization is terminated or revoked.  Performed at [facility], [address] [place], [place] [address]   MRSA Next Gen by PCR, Nasal     Status: None   Collection Time: 05/30/22  2:00 AM   Specimen: Nasal Mucosa; Nasal Swab  Result Value Ref Range Status   MRSA by PCR Next Gen NOT DETECTED NOT DETECTED Final    Comment: (NOTE) The GeneXpert MRSA Assay (FDA approved for NASAL specimens only), is one component of a comprehensive MRSA colonization surveillance program. It is not intended to diagnose MRSA infection nor to guide or monitor treatment for MRSA infections. Test performance is not FDA approved in patients less than 6 years old. Performed at [facility], [address] [place], [place] [address]     Time coordinating discharge: 36 mins  SIGNED:  Irwin Brakeman, MD  [facility] 06/01/2022, 10:54 AM How to contact the [facility] Attending or Consulting provider [place] or covering provider during after hours [place], for this patient?   Check the care team in [facility] and look for a) attending/consulting [facility] provider listed and b) the [facility] team listed Log into [email or web address] and use [place]'s universal password to access. If you do not have the password, please contact the hospital operator. Locate the [facility] provider you are looking for under [facility] and page to a number that you can be directly reached. If you still have difficulty reaching the provider, please page the [facility] (Director on Call) for the Hospitalists listed on amion for assistance.

## 2022-06-01 NOTE — Progress Notes (Signed)
Patient ID: Samantha Mcfarland, female   DOB: Oct 03, 1955, 66 y.o.   MRN: [id number] S: Feels well and wants to go home. O:BP 123/74 (BP Location: Right Arm)   Pulse 66   Temp (!) 97 F (36.1 C)   Resp 16   Ht '5\' 6"'$  (1.676 m)   Wt 54.6 kg   SpO2 99%   BMI 19.43 kg/m   Intake/Output Summary (Last 24 hours) at 06/01/2022 1009 Last data filed at 06/01/2022 0957 Gross per 24 hour  Intake 720 ml  Output 2500 ml  Net -1780 ml   Intake/Output: I/O last 3 completed shifts: In: 1627.4 [P.O.:680; I.V.:947.4] Out: 3700 [Urine:3700]  Intake/Output this shift:  Total I/O In: 240 [P.O.:240] Out: 850 [Urine:850] Weight change:  Gen: NAD CVS: RRR Resp:CTA Abd: +BS, soft, NT/ND Ext: no edema  Recent Labs  Lab 05/29/22 1402 05/29/22 1735 05/30/22 0129 05/30/22 0400 05/30/22 0527 05/30/22 0903 05/30/22 1443 05/30/22 1942 05/30/22 2258 05/31/22 0509 05/31/22 0914 05/31/22 1245 05/31/22 1726 05/31/22 2029 06/01/22 0048 06/01/22 0429 06/01/22 0835  NA 108*   < > 113* 114*   < > 113* 115* 118*   < > 127* 124* 125* 125* 124* 127* 129* 130*  K 3.6   < > 3.1* 3.2*  --  2.8* 3.0* 3.9  --  4.2  --   --   --   --   --  3.8  --   CL 76*   < > 83* 85*  --  86* 87* 93*  --  103  --   --   --   --   --  100  --   CO2 21*   < > 19* 16*  --  19* 19* 18*  --  21*  --   --   --   --   --  22  --   GLUCOSE 79   < > 70 55*  --  229* 134* 145*  --  74  --   --   --   --   --  87  --   BUN 6*   < > 5* 5*  --  6* 8 9  --  6*  --   --   --   --   --  5*  --   CREATININE 0.47   < > 0.44 0.40*  --  0.54 0.54 0.72  --  0.50  --   --   --   --   --  0.48  --   ALBUMIN 4.7  --   --   --   --   --   --   --   --   --   --   --   --   --   --   --   --   CALCIUM 8.8*   < > 8.2* 8.2*  --  8.2* 8.2* 8.3*  --  8.7*  --   --   --   --   --  8.9  --   AST 32  --   --   --   --   --   --   --   --   --   --   --   --   --   --   --   --   ALT 18  --   --   --   --   --   --   --   --   --   --   --   --   --   --   --    --    < > =  values in this interval not displayed.   Liver Function Tests: Recent Labs  Lab 05/29/22 1402  AST 32  ALT 18  ALKPHOS 58  BILITOT 0.7  PROT 7.3  ALBUMIN 4.7   Recent Labs  Lab 05/29/22 1402  LIPASE 29   No results for input(s): "AMMONIA" in the last 168 hours. CBC: Recent Labs  Lab 05/29/22 1402 05/30/22 0400  WBC 6.1 5.1  NEUTROABS 3.2  --   HGB 13.7 14.2  HCT 36.6 38.0  MCV 88.6 88.6  PLT 318 320   Cardiac Enzymes: No results for input(s): "CKTOTAL", "CKMB", "CKMBINDEX", "TROPONINI" in the last 168 hours. CBG: No results for input(s): "GLUCAP" in the last 168 hours.  Iron Studies: No results for input(s): "IRON", "TIBC", "TRANSFERRIN", "FERRITIN" in the last 72 hours. Studies/Results: No results found.  anastrozole  1 mg Oral Daily   enoxaparin (LOVENOX) injection  40 mg Subcutaneous G86P   folic acid  1 mg Oral Daily   gabapentin  300 mg Oral BID   multivitamin with minerals  1 tablet Oral Daily   thiamine  100 mg Oral Daily   Or   thiamine  100 mg Intravenous Daily    BMET    Component Value Date/Time   NA 130 (L) 06/01/2022 0835   NA 134 02/09/2022 1437   K 3.8 06/01/2022 0429   CL 100 06/01/2022 0429   CO2 22 06/01/2022 0429   GLUCOSE 87 06/01/2022 0429   BUN 5 (L) 06/01/2022 0429   BUN 5 (L) 02/09/2022 1437   CREATININE 0.48 06/01/2022 0429   CALCIUM 8.9 06/01/2022 0429   GFRNONAA >60 06/01/2022 0429   CBC    Component Value Date/Time   WBC 5.1 05/30/2022 0400   RBC 4.29 05/30/2022 0400   HGB 14.2 05/30/2022 0400   HGB 10.3 (L) 02/09/2022 1437   HCT 38.0 05/30/2022 0400   HCT 29.8 (L) 02/09/2022 1437   PLT 320 05/30/2022 0400   PLT 358 02/09/2022 1437   MCV 88.6 05/30/2022 0400   MCV 100 (H) 02/09/2022 1437   MCH 33.1 05/30/2022 0400   MCHC 37.4 (H) 05/30/2022 0400   RDW 11.2 (L) 05/30/2022 0400   RDW 14.9 02/09/2022 1437   LYMPHSABS 1.8 05/29/2022 1402   LYMPHSABS 2.0 02/09/2022 1437   MONOABS 0.7 05/29/2022  1402   EOSABS 0.3 05/29/2022 1402   EOSABS 0.4 02/09/2022 1437   BASOSABS 0.0 05/29/2022 1402   BASOSABS 0.1 02/09/2022 1437    Assessment/Plan Hyponatremia:  initially seemed hypovolemic, looks euvolemic now.  Suspect multifactorial - hypovolemia with diarrhea + low solute intake but also concern for SIADH in the past (poss SSRI assoc which she's off now).  Initially improved with isotonic volume from 108 to 115 over the first 24 hours, however increased from 115 to 127 over the next 24 hours.  Due to slight over-correction, she was started on D5W at 100 mL/hr this morning with drop to 123.  D5W was stopped yesterday afternoon and she has further corrected to 130 on her own.  Stable for discharge to home.  Neurologically intact.  Given recurrence of hyponatremia will arrange for outpatient follow up with our office in [place].  I also encouraged better protein intake and instructed her to stop drinking beer. Hypokalemia:  repleted and stable.    EtOH: on CIWA.  Admits to 2-3 beers but denies daily intake.  h/o anxiety: off SSRI   h/o stage 1 breast Ca: mastectomy 2022, s/p chemo, on anastrazole -  f/b Dr. Delton Coombes.   Disposition - stable for discharge to home as above.   Samantha Potts, MD [facility]

## 2022-06-01 NOTE — Discharge Instructions (Signed)
PLEASE STOP DRINKING ALCOHOL.    PLEASE FOLLOW UP WITH [facility] IN [place].   PLEASE SEE YOUR PRIMARY CARE PROVIDER IN 1 WEEK TO HAVE SODIUM CHECKED.    IMPORTANT INFORMATION: PAY CLOSE ATTENTION   PHYSICIAN DISCHARGE INSTRUCTIONS  Follow with Primary care provider  Rakes, Connye Burkitt, FNP  and other consultants as instructed by your Hospitalist Physician  [place] IF SYMPTOMS COME BACK, WORSEN OR NEW PROBLEM DEVELOPS   Please note: You were cared for by a hospitalist during your hospital stay. Every effort will be made to forward records to your primary care provider.  You can request that your primary care provider send for your hospital records if they have not received them.  Once you are discharged, your primary care physician will handle any further medical issues. Please note that NO REFILLS for any discharge medications will be authorized once you are discharged, as it is imperative that you return to your primary care physician (or establish a relationship with a primary care physician if you do not have one) for your post hospital discharge needs so that they can reassess your need for medications and monitor your lab values.  Please get a complete blood count and chemistry panel checked by your Primary MD at your next visit, and again as instructed by your Primary MD.  Get Medicines reviewed and adjusted: Please take all your medications with you for your next visit with your Primary MD  Laboratory/radiological data: Please request your Primary MD to go over all hospital tests and procedure/radiological results at the follow up, please ask your primary care provider to get all Hospital records sent to his/her office.  In some cases, they will be blood work, cultures and biopsy results pending at the time of your discharge. Please request that your primary care provider follow up on these results.  If you are diabetic, please bring your  blood sugar readings with you to your follow up appointment with primary care.    Please call and make your follow up appointments as soon as possible.    Also Note the following: If you experience worsening of your admission symptoms, develop shortness of breath, life threatening emergency, suicidal or homicidal thoughts you must seek medical attention immediately by calling 911 or calling your MD immediately  if symptoms less severe.  You must read complete instructions/literature along with all the possible adverse reactions/side effects for all the Medicines you take and that have been prescribed to you. Take any new Medicines after you have completely understood and accpet all the possible adverse reactions/side effects.   Do not drive when taking Pain medications or sleeping medications (Benzodiazepines)  Do not take more than prescribed Pain, Sleep and Anxiety Medications. It is not advisable to combine anxiety,sleep and pain medications without talking with your primary care practitioner  Special Instructions: If you have smoked or chewed Tobacco  in the last 2 yrs please stop smoking, stop any regular Alcohol  and or any Recreational drug use.  Wear Seat belts while driving.  Do not drive if taking any narcotic, mind altering or controlled substances or recreational drugs or alcohol.

## 2022-06-03 ENCOUNTER — Telehealth: Payer: Self-pay | Admitting: Family Medicine

## 2022-06-03 ENCOUNTER — Encounter: Payer: Self-pay | Admitting: *Deleted

## 2022-06-03 ENCOUNTER — Telehealth: Payer: Self-pay | Admitting: *Deleted

## 2022-06-03 NOTE — Telephone Encounter (Signed)
Has hospital follow up scheduled on 12/21- DOD. Looks like Samantha Mcfarland is DOD that day.  Are you wanting her to come in before appointment to have labs? States she spoke to someone and they told her to. Will send to PCP and provider that patient was scheduled with.

## 2022-06-03 NOTE — Patient Outreach (Signed)
Care Coordination [facility] Note Transition Care Management Follow-up Telephone Call Date of discharge and from where: Tuesday, 06/01/22, Samantha Mcfarland; hyponatremia with nausea and dizziness How have you been since you were released from the hospital? "I really don't feel any better than I did before I went to the hospital.... I feel weak and don't have any energy and I have a decreased appetite and a headache.  I have not yet got the vitamins that they told me to take- but I am going to call my sister today and see if she can pick those up for me; they are just OTC so I will get them as soon as I can.  My sister and a friend are looking in on me, I am just doing the best I can.  I will call my PCP office and see if I can get the updated labs done before my visit with Vaughan Basta next week so she will have the results ready when I go to my scheduled appointment" Any questions or concerns? Yes- reports still not feeling well after recent hospital discharge; provided education and advice that patient needs to obtain and start taking OTC vitamins as prescribed; importance of nutrition in setting of frequent bouts of hyponatremia; provided education and advice around option to have lab work done prior to scheduled PCP office visit so that PCP will have updated results at time of scheduled visit  Items Reviewed: Did the pt receive and understand the discharge instructions provided? Yes  Medications obtained and verified? No - patient has not yet picked up prescribed OTC vitamins post-hospital discharge; she declines medication review today as she is not feeling up to doing at time of our call; she denies current questions or concerns around her medications Other? No  Any new allergies since your discharge? No  Dietary orders reviewed? Yes Do you have support at home? Yes  reports essentially independent in all of self-care; reports sister and a friend are periodically checking in and assisting with any care needs as  indicated  Home Care and Equipment/Supplies: Were home health services ordered? no If so, what is the name of the agency? N/A  Has the agency set up a time to come to the patient's home? not applicable Were any new equipment or medical supplies ordered?  No What is the name of the medical supply agency? N/A Were you able to get the supplies/equipment? not applicable Do you have any questions related to the use of the equipment or supplies? No N/A  Functional Questionnaire: (I = Independent and D = Dependent) ADLs: I  sister and a friend are periodically checking in and assisting with any care needs as indicated  Bathing/Dressing- I  Meal Prep- I  sister and a friend are periodically checking in and assisting with any care needs as indicated  Eating- I  Maintaining continence- I  Transferring/Ambulation- I  Managing Meds- I  sister and a friend are periodically checking in and assisting with any care needs as indicated  Follow up appointments reviewed:  PCP Hospital f/u appt confirmed? Yes  Scheduled to see PCP, Darla Lesches NP on Thursday 06/10/22 @ 10:35 am [facility] f/u appt confirmed? Yes  Scheduled to see nephrology provider (new patient) on Wednesday 06/16/22 @ 2:20 pm Are transportation arrangements needed? No  If their condition worsens, is the pt aware to call PCP or go to the Emergency Dept.? Yes Was the patient provided with contact information for the PCP's office or ED? No- patient declines;  states already has contact information for all care providers Was to pt encouraged to call back with questions or concerns? Yes- provided my direct contact information should needs arise in the future  SDOH assessments and interventions completed:   Yes SDOH Interventions Today    Flowsheet Row Most Recent Value  SDOH Interventions   Food Insecurity Interventions Intervention Not Indicated  Transportation Interventions Intervention Not Indicated  [sister provides  transportation]      Care Coordination Interventions:  Provided education around purpose of vitamins prescribed at time of hospital discharge and encouraged patient to promptly obtain; provided education around possibility of having lab work drawn prior to upcoming scheduled PCP office visit next week; provided education around need to stay hydrated and to eat well to maintain Mcfarland+ levels; discussed talking points to cover when she has new patient appointment with nephrology; made RN CM Care Coordinator aware of TOC call today    Encounter Outcome:  Pt. Visit Completed    Oneta Rack, RN, BSN, CCRN Alumnus RN CM Care Coordination/ Transition of [place] Management [phone]: direct office

## 2022-06-03 NOTE — Telephone Encounter (Signed)
Patient aware and verbalizes understanding. 

## 2022-06-09 NOTE — Patient Instructions (Addendum)
Visit Information  Thank you for taking time to visit with me today. Please don't hesitate to contact me if I can be of assistance to you.   Following are the goals we discussed today:   Goals Addressed               This Visit's Progress     Patient Stated     manage back pain (THN) (pt-stated)   Not on track     Care Coordination Interventions: Reviewed provider established plan for pain management Counseled on the importance of reporting any/all new or changed pain symptoms or management strategies to pain management provider Reviewed with patient prescribed pharmacological and nonpharmacological pain relief strategies Sent in basket message to pcp with patient permission      COMPLETED: manage hearing loss [facility]) (pt-stated)   On track     Care Coordination Interventions: Discussed plans with patient for ongoing care management follow up and provided patient with direct contact information for care management team  Confirms she has obtained hearing aides Noted she is not requesting RN CM to repeat information  Goal met         Our next appointment is by telephone on 06/28/22 at 1030  Please call the care guide team at [phone] if you need to cancel or reschedule your appointment.   If you are experiencing a Mental Health or [place] or need someone to talk to, please call the Suicide and Crisis Lifeline: 988 call the [place] National Suicide Prevention Lifeline: (905) 880-3620 or TTY: 831 193 0953 TTY (785)677-5595) to talk to a trained counselor call 1-800-273-TALK (toll free, 24 hour hotline) call the [facility]: [phone] call 911   Patient verbalizes understanding of instructions and care plan provided today and agrees to view in [place]. Active MyChart status and patient understanding of how to access instructions and care plan via MyChart confirmed with patient.     The patient has been provided with contact information for  the care management team and has been advised to call with any health related questions or concerns.   Lonni Dirden L. Lavina Hamman, RN, BSN, [place] Coordinator Office number [phone]

## 2022-06-10 ENCOUNTER — Ambulatory Visit (INDEPENDENT_AMBULATORY_CARE_PROVIDER_SITE_OTHER): Payer: Medicare Other | Admitting: Family

## 2022-06-10 ENCOUNTER — Encounter: Payer: Self-pay | Admitting: Family

## 2022-06-10 VITALS — BP 131/81 | HR 83 | Temp 97.9°F | Ht 66.0 in | Wt 118.0 lb

## 2022-06-10 DIAGNOSIS — I10 Essential (primary) hypertension: Secondary | ICD-10-CM | POA: Diagnosis not present

## 2022-06-10 DIAGNOSIS — Z09 Encounter for follow-up examination after completed treatment for conditions other than malignant neoplasm: Secondary | ICD-10-CM | POA: Diagnosis not present

## 2022-06-10 DIAGNOSIS — F1090 Alcohol use, unspecified, uncomplicated: Secondary | ICD-10-CM

## 2022-06-10 DIAGNOSIS — E871 Hypo-osmolality and hyponatremia: Secondary | ICD-10-CM

## 2022-06-10 DIAGNOSIS — F411 Generalized anxiety disorder: Secondary | ICD-10-CM

## 2022-06-10 DIAGNOSIS — R531 Weakness: Secondary | ICD-10-CM | POA: Diagnosis not present

## 2022-06-10 DIAGNOSIS — Z789 Other specified health status: Secondary | ICD-10-CM

## 2022-06-10 DIAGNOSIS — M25531 Pain in right wrist: Secondary | ICD-10-CM

## 2022-06-10 DIAGNOSIS — E222 Syndrome of inappropriate secretion of antidiuretic hormone: Secondary | ICD-10-CM | POA: Diagnosis not present

## 2022-06-10 DIAGNOSIS — F331 Major depressive disorder, recurrent, moderate: Secondary | ICD-10-CM

## 2022-06-10 LAB — CMP14+EGFR
ALT: 26 [IU]/L (ref 0–32)
AST: 38 [IU]/L (ref 0–40)
Albumin/Globulin Ratio: 2.1 (ref 1.2–2.2)
Albumin: 4.7 g/dL (ref 3.9–4.9)
Alkaline Phosphatase: 55 [IU]/L (ref 44–121)
BUN/Creatinine Ratio: 19 (ref 12–28)
BUN: 13 mg/dL (ref 8–27)
Bilirubin Total: 0.3 mg/dL (ref 0.0–1.2)
CO2: 20 mmol/L (ref 20–29)
Calcium: 9.6 mg/dL (ref 8.7–10.3)
Chloride: 101 mmol/L (ref 96–106)
Creatinine, Ser: 0.68 mg/dL (ref 0.57–1.00)
Globulin, Total: 2.2 g/dL (ref 1.5–4.5)
Glucose: 79 mg/dL (ref 70–99)
Potassium: 4.4 mmol/L (ref 3.5–5.2)
Sodium: 136 mmol/L (ref 134–144)
Total Protein: 6.9 g/dL (ref 6.0–8.5)
eGFR: 96 mL/min/{1.73_m2}

## 2022-06-10 LAB — CBC WITH DIFFERENTIAL/PLATELET
Basophils Absolute: 0 10*3/uL (ref 0.0–0.2)
Basos: 1 %
EOS (ABSOLUTE): 0.2 10*3/uL (ref 0.0–0.4)
Eos: 3 %
Hematocrit: 34 % (ref 34.0–46.6)
Hemoglobin: 11.4 g/dL (ref 11.1–15.9)
Immature Grans (Abs): 0 10*3/uL (ref 0.0–0.1)
Immature Granulocytes: 0 %
Lymphocytes Absolute: 1.9 10*3/uL (ref 0.7–3.1)
Lymphs: 28 %
MCH: 33.2 pg — ABNORMAL HIGH (ref 26.6–33.0)
MCHC: 33.5 g/dL (ref 31.5–35.7)
MCV: 99 fL — ABNORMAL HIGH (ref 79–97)
Monocytes Absolute: 0.4 10*3/uL (ref 0.1–0.9)
Monocytes: 6 %
Neutrophils Absolute: 4.1 10*3/uL (ref 1.4–7.0)
Neutrophils: 62 %
Platelets: 362 10*3/uL (ref 150–450)
RBC: 3.43 x10E6/uL — ABNORMAL LOW (ref 3.77–5.28)
RDW: 12.6 % (ref 11.7–15.4)
WBC: 6.6 10*3/uL (ref 3.4–10.8)

## 2022-06-10 MED ORDER — PREDNISONE 20 MG PO TABS: 40.0000 mg | ORAL_TABLET | Freq: Every day | ORAL | 0 refills | Status: AC

## 2022-06-10 NOTE — Patient Instructions (Signed)
Syndrome of Inappropriate Antidiuresis Syndrome of inappropriate antidiuresis (SIAD), sometimes referred to as syndrome of inappropriate antidiuretic hormone (SIADH), is a condition in which extra antidiuretic hormone (ADH) is produced in the body or receptors of ADH have increased sensitivity. ADH controls water levels in the body. When too much of the hormone is produced, it can cause problems such as low salt (sodium) levels in the blood and reduced urine production. When this happens, the body retains too much water. Symptoms can range from mild to severe. SIAD is most common in people who are hospitalized for another reason. What are the causes? This condition may be caused by: Tumors. Injury to the brain, or infection or inflammation in the brain. Lung infections or diseases. Cancer. Major surgery. Extreme exercise. Stroke. Other causes may include: Problems with breathing (respiratory failure). Guillain-Barr syndrome (GBS). Medicines, including medicines that help remove water from the body (diuretics), certain antidepressants, chemotherapy drugs, pain medicines, and NSAIDs, such as ibuprofen. AIDS. What increases the risk? The following factors may make you more likely to develop this condition: Recent major surgery. Hospitalization. What are the signs or symptoms? Symptoms of this condition include: Irritability. Muscle cramps or weakness. Nausea or vomiting. Mental changes, such as memory problems, aggressiveness, confusion, or seeing, hearing, tasting, smelling, or feeling things that are not real (hallucinations). Seizure. Coma. How is this diagnosed? This condition may be diagnosed based on: Your medical history. A physical exam. Blood tests. Urine tests. Chest X-ray or brain scan. How is this treated? Treatment for this condition focuses on relieving symptoms. Treatment may include: Restricting fluid intake. Receiving saline, which is made of salt and water,  slowly through an IV. Taking diuretics. Taking medicines to help control sodium levels. Initial treatment for SIAD is done in a hospital, so your health care team can monitor your condition closely. Other treatments may focus on treating the underlying cause of the condition. Follow these instructions at home: Take over-the-counter and prescription medicines only as told by your health care provider. Follow instructions from your health care provider about eating or drinking restrictions. Keep all follow-up visits. This is important. Contact a health care provider if you: Have muscle cramps or weakness. Are nauseous or you vomit. Have changes in how you think, feel, or act (mental status). Get help right away if you have: A seizure. Loss of consciousness. Mental changes, such as memory problems, aggressiveness, confusion, or hallucinations. These symptoms may be an emergency. Get help right away. Call 911. Do not wait to see if the symptoms will go away. Do not drive yourself to the hospital. Summary Syndrome of inappropriate antidiuresis (SIAD) is a condition in which extra antidiuretic hormone (ADH) is produced in the body or receptors of ADH have increased sensitivity. SIAD is most common in people who are hospitalized for another reason. Initial treatment for SIAD is done in the hospital setting to monitor your condition closely. Follow instructions from your health care provider about eating or drinking restrictions. This information is not intended to replace advice given to you by your health care provider. Make sure you discuss any questions you have with your health care provider. Document Revised: 12/16/2020 Document Reviewed: 12/16/2020 Elsevier Patient Education  [place].

## 2022-06-10 NOTE — Progress Notes (Signed)
Subjective:    Patient ID: Samantha Mcfarland, female    DOB: [birth year], 66 y.o.   MRN: [id number]  Chief Complaint  Patient presents with   Hospitalization Follow-up   Pt presents to the office today for hospital follow up. She went to hospital on 05/30/22 with generalized weakness, and nausea. Diagnosed with hyponatremia. She has hx of alcohol abuse, HTN, and GAD.   She was diagnosed with SIADH. She has an appointment with nephrologists next week.   Reports she has drank one beer since discharge.   Still complaining of weakness and loss of balance.   Paxil stopped.  Hypertension This is a chronic problem. The current episode started more than 1 year ago. The problem has been resolved since onset. The problem is controlled. Associated symptoms include malaise/fatigue. Pertinent negatives include no peripheral edema or shortness of breath.  Depression        This is a chronic problem.  The current episode started more than 1 year ago.   The problem occurs intermittently.  Associated symptoms include fatigue, decreased interest and sad.  Associated symptoms include no helplessness and no hopelessness. Wrist Pain  The pain is present in the right wrist. This is a new problem. The current episode started 1 to 4 weeks ago. There has been no history of extremity trauma. The problem occurs intermittently. The problem has been waxing and waning. The pain is at a severity of 9/10. The pain is moderate. She has tried rest for the symptoms. The treatment provided mild relief.      Review of Systems  Constitutional:  Positive for fatigue and malaise/fatigue.  Respiratory:  Negative for shortness of breath.   Psychiatric/Behavioral:  Positive for depression.        Objective:   Physical Exam Vitals reviewed.  Constitutional:      General: She is not in acute distress.    Appearance: She is well-developed.  HENT:     Head: Normocephalic and atraumatic.  Eyes:     Pupils: Pupils are equal,  round, and reactive to light.  Neck:     Thyroid: No thyromegaly.  Cardiovascular:     Rate and Rhythm: Normal rate and regular rhythm.     Heart sounds: Normal heart sounds. No murmur heard. Pulmonary:     Effort: Pulmonary effort is normal. No respiratory distress.     Breath sounds: Normal breath sounds. No wheezing.  Abdominal:     General: Bowel sounds are normal. There is no distension.     Palpations: Abdomen is soft.     Tenderness: There is no abdominal tenderness.  Musculoskeletal:        General: No tenderness. Normal range of motion.     Cervical back: Normal range of motion and neck supple.  Skin:    General: Skin is warm and dry.  Neurological:     Mental Status: She is alert and oriented to person, place, and time.     Cranial Nerves: No cranial nerve deficit.     Motor: Weakness (generalized weakness, in wheelchair) present.     Deep Tendon Reflexes: Reflexes are normal and symmetric.  Psychiatric:        Behavior: Behavior normal.        Thought Content: Thought content normal.        Judgment: Judgment normal.          BP 131/81   Pulse 83   Temp 97.9 F (36.6 C) (Temporal)   Ht _0  (  1.676 m)   Wt 118 lb (53.5 kg)   BMI 19.05 kg/m   Assessment & Plan:  Samantha Mcfarland comes in today with chief complaint of Hospitalization Follow-up   Diagnosis and orders addressed:  1. Hyponatremia - CMP14+EGFR - CBC with Differential/Platelet  2. Hospital discharge follow-up - CMP14+EGFR - CBC with Differential/Platelet  3. SIADH (syndrome of inappropriate ADH production) (HCC) - CMP14+EGFR - CBC with Differential/Platelet  4. Alcohol use - CMP14+EGFR - CBC with Differential/Platelet  5. Essential hypertension  - CMP14+EGFR - CBC with Differential/Platelet  6. Generalized anxiety disorder - CMP14+EGFR - CBC with Differential/Platelet  7. Moderate recurrent major depression (HCC) - CMP14+EGFR - CBC with Differential/Platelet  8. Weakness  -  CMP14+EGFR - CBC with Differential/Platelet  9. Right wrist pain  - predniSONE (DELTASONE) 20 MG tablet; Take 2 tablets (40 mg total) by mouth daily with breakfast for 5 days.  Dispense: 10 tablet; Refill: 0   Labs pending Keep nephrologists follow up Limit fluid intake Health Maintenance reviewed Diet and exercise encouraged  Follow up plan: Keep follow up with PCP   Evelina Dun, FNP

## 2022-06-16 DIAGNOSIS — E871 Hypo-osmolality and hyponatremia: Secondary | ICD-10-CM | POA: Diagnosis not present

## 2022-06-16 DIAGNOSIS — I10 Essential (primary) hypertension: Secondary | ICD-10-CM | POA: Diagnosis not present

## 2022-06-16 DIAGNOSIS — E222 Syndrome of inappropriate secretion of antidiuretic hormone: Secondary | ICD-10-CM | POA: Diagnosis not present

## 2022-06-16 DIAGNOSIS — E782 Mixed hyperlipidemia: Secondary | ICD-10-CM | POA: Diagnosis not present

## 2022-06-16 DIAGNOSIS — Z853 Personal history of malignant neoplasm of breast: Secondary | ICD-10-CM | POA: Diagnosis not present

## 2022-06-17 ENCOUNTER — Encounter (INDEPENDENT_AMBULATORY_CARE_PROVIDER_SITE_OTHER): Payer: Self-pay | Admitting: *Deleted

## 2022-06-28 ENCOUNTER — Encounter: Payer: Self-pay | Admitting: *Deleted

## 2022-07-07 ENCOUNTER — Other Ambulatory Visit (HOSPITAL_COMMUNITY)
Admission: RE | Admit: 2022-07-07 | Discharge: 2022-07-07 | Disposition: A | Discharge status: Home / Self Care | Payer: 59 | Source: Ambulatory Visit | Attending: Nephrology | Admitting: Nephrology

## 2022-07-07 DIAGNOSIS — Z853 Personal history of malignant neoplasm of breast: Secondary | ICD-10-CM | POA: Diagnosis not present

## 2022-07-07 DIAGNOSIS — I10 Essential (primary) hypertension: Secondary | ICD-10-CM | POA: Diagnosis not present

## 2022-07-07 DIAGNOSIS — Z87891 Personal history of nicotine dependence: Secondary | ICD-10-CM | POA: Diagnosis not present

## 2022-07-07 DIAGNOSIS — E871 Hypo-osmolality and hyponatremia: Secondary | ICD-10-CM | POA: Diagnosis present

## 2022-07-07 DIAGNOSIS — E222 Syndrome of inappropriate secretion of antidiuretic hormone: Secondary | ICD-10-CM | POA: Diagnosis not present

## 2022-07-07 DIAGNOSIS — E782 Mixed hyperlipidemia: Secondary | ICD-10-CM | POA: Insufficient documentation

## 2022-07-07 LAB — CBC
HCT: 35 % — ABNORMAL LOW (ref 36.0–46.0)
Hemoglobin: 11.9 g/dL — ABNORMAL LOW (ref 12.0–15.0)
MCH: 33.2 pg (ref 26.0–34.0)
MCHC: 34 g/dL (ref 30.0–36.0)
MCV: 97.8 fL (ref 80.0–100.0)
Platelets: 294 10*3/uL (ref 150–400)
RBC: 3.58 MIL/uL — ABNORMAL LOW (ref 3.87–5.11)
RDW: 13.6 % (ref 11.5–15.5)
WBC: 4.7 10*3/uL (ref 4.0–10.5)
nRBC: 0 % (ref 0.0–0.2)

## 2022-07-07 LAB — URINALYSIS, ROUTINE W REFLEX MICROSCOPIC
Bacteria, UA: NONE SEEN
Bilirubin Urine: NEGATIVE
Glucose, UA: NEGATIVE mg/dL
Hgb urine dipstick: NEGATIVE
Ketones, ur: NEGATIVE mg/dL
Nitrite: NEGATIVE
Protein, ur: NEGATIVE mg/dL
Specific Gravity, Urine: 1.011 (ref 1.005–1.030)
pH: 5 (ref 5.0–8.0)

## 2022-07-07 LAB — PROTEIN / CREATININE RATIO, URINE
Creatinine, Urine: 64.79 mg/dL
Total Protein, Urine: <6 mg/dL

## 2022-07-07 LAB — RENAL FUNCTION PANEL
Albumin: 4.3 g/dL (ref 3.5–5.0)
Anion gap: 8 (ref 5–15)
BUN: 11 mg/dL (ref 8–23)
CO2: 28 mmol/L (ref 22–32)
Calcium: 9.1 mg/dL (ref 8.9–10.3)
Chloride: 99 mmol/L (ref 98–111)
Creatinine, Ser: 0.7 mg/dL (ref 0.44–1.00)
GFR, Estimated: >60 mL/min (ref >60)
Glucose, Bld: 86 mg/dL (ref 70–99)
Phosphorus: 3.6 mg/dL (ref 2.5–4.6)
Potassium: 3.9 mmol/L (ref 3.5–5.1)
Sodium: 135 mmol/L (ref 135–145)

## 2022-07-07 LAB — CREATININE, URINE, RANDOM: Creatinine, Urine: 65.17 mg/dL

## 2022-07-07 LAB — SODIUM, URINE, RANDOM: Sodium, Ur: 16 mmol/L

## 2022-07-08 ENCOUNTER — Telehealth: Payer: Self-pay

## 2022-07-08 DIAGNOSIS — E782 Mixed hyperlipidemia: Secondary | ICD-10-CM

## 2022-07-08 LAB — URINE CULTURE: Culture: <10000 — AB

## 2022-07-08 LAB — OSMOLALITY, URINE: Osmolality, Ur: 297 mOsm/kg — ABNORMAL LOW (ref 300–900)

## 2022-07-08 LAB — OSMOLALITY: Osmolality: 291 mOsm/kg (ref 275–295)

## 2022-07-08 MED ORDER — ATORVASTATIN CALCIUM 20 MG PO TABS: 20.0000 mg | ORAL_TABLET | Freq: Every day | ORAL | 0 refills | Status: DC

## 2022-07-08 NOTE — Telephone Encounter (Signed)
-----  Message from Baruch Gouty, FNP sent at 07/08/2022 11:20 AM EST ----- Regarding: RE: Medication Adherence Can we see if pt is still taking her statin as prescribed? If so, we can send in for 100 day supply.    ----- Message ----- From: Delight Stare, CPhT Sent: 07/08/2022  11:14 AM EST To: Baruch Gouty, FNP Subject: Medication Adherence                           Otilio Connors!   I am doing a medication adherence fails report, and came across Ms. Buth.  She has failed the statin measure for 2023, and per [facility] has not filled it since may.  I have left her several messages and can't get in touch with her. Is she still on it?  If so can we get a 100DS sent to the pharmacy and see if that will help with adherence better.  Please let me know if there is anything else I can do to help.  Thanks so much Lelon Huh, [place] [phone]

## 2022-07-08 NOTE — Telephone Encounter (Signed)
Error

## 2022-07-08 NOTE — Telephone Encounter (Signed)
Rx sent. Patient aware.  

## 2022-07-08 NOTE — Telephone Encounter (Signed)
Pt says that she is taking her statin and has a few left. Use [employer]

## 2022-07-08 NOTE — Telephone Encounter (Signed)
lmtcb

## 2022-07-08 NOTE — Telephone Encounter (Signed)
Rx sent 

## 2022-07-14 ENCOUNTER — Ambulatory Visit: Payer: Self-pay

## 2022-07-14 NOTE — Patient Outreach (Signed)
  Care Coordination   07/14/2022 Name: Dea Bitting MRN: [id number] DOB: 23-Jan-1956   Care Coordination Outreach Attempts:  An unsuccessful telephone outreach was attempted today to offer the patient information about available care coordination services as a benefit of their health plan.   Follow Up Plan:  Additional outreach attempts will be made to offer the patient care coordination information and services.   Encounter Outcome:  No Answer   Care Coordination Interventions:  No, not indicated    Jone Baseman, RN, MSN [employer] [facility] Care Management Coordinator Direct Line [phone]

## 2022-07-16 DIAGNOSIS — E222 Syndrome of inappropriate secretion of antidiuretic hormone: Secondary | ICD-10-CM | POA: Diagnosis not present

## 2022-07-16 DIAGNOSIS — E538 Deficiency of other specified B group vitamins: Secondary | ICD-10-CM | POA: Diagnosis not present

## 2022-07-16 DIAGNOSIS — D638 Anemia in other chronic diseases classified elsewhere: Secondary | ICD-10-CM | POA: Diagnosis not present

## 2022-07-16 DIAGNOSIS — I10 Essential (primary) hypertension: Secondary | ICD-10-CM | POA: Diagnosis not present

## 2022-07-16 DIAGNOSIS — E871 Hypo-osmolality and hyponatremia: Secondary | ICD-10-CM | POA: Diagnosis not present

## 2022-07-22 ENCOUNTER — Ambulatory Visit: Payer: 59 | Admitting: Family Medicine

## 2022-07-27 ENCOUNTER — Ambulatory Visit (INDEPENDENT_AMBULATORY_CARE_PROVIDER_SITE_OTHER): Payer: 59 | Admitting: Family Medicine

## 2022-07-27 ENCOUNTER — Encounter: Payer: Self-pay | Admitting: Family Medicine

## 2022-07-27 VITALS — BP 115/69 | HR 65 | Temp 96.9°F | Ht 66.0 in | Wt 121.6 lb

## 2022-07-27 DIAGNOSIS — H6123 Impacted cerumen, bilateral: Secondary | ICD-10-CM

## 2022-07-27 DIAGNOSIS — E871 Hypo-osmolality and hyponatremia: Secondary | ICD-10-CM

## 2022-07-27 DIAGNOSIS — H9313 Tinnitus, bilateral: Secondary | ICD-10-CM

## 2022-07-27 DIAGNOSIS — R2689 Other abnormalities of gait and mobility: Secondary | ICD-10-CM | POA: Diagnosis not present

## 2022-07-27 DIAGNOSIS — M67431 Ganglion, right wrist: Secondary | ICD-10-CM | POA: Diagnosis not present

## 2022-07-27 LAB — BMP8+EGFR
BUN/Creatinine Ratio: 16 (ref 12–28)
BUN: 11 mg/dL (ref 8–27)
CO2: 22 mmol/L (ref 20–29)
Calcium: 9.4 mg/dL (ref 8.7–10.3)
Chloride: 92 mmol/L — ABNORMAL LOW (ref 96–106)
Creatinine, Ser: 0.7 mg/dL (ref 0.57–1.00)
Glucose: 79 mg/dL (ref 70–99)
Potassium: 4.3 mmol/L (ref 3.5–5.2)
Sodium: 129 mmol/L — ABNORMAL LOW (ref 134–144)
eGFR: 95 mL/min/{1.73_m2} (ref >59)

## 2022-07-27 MED ORDER — PREDNISONE 20 MG PO TABS: 40.0000 mg | ORAL_TABLET | Freq: Every day | ORAL | 0 refills | Status: AC

## 2022-07-27 NOTE — Progress Notes (Signed)
Subjective:  Patient ID: Samantha Mcfarland, female    DOB: January 18, 1956, 67 y.o.   MRN: [id number]  Patient Care Team: Baruch Gouty, FNP as PCP - General (Family Medicine) Derek Jack, MD as Medical Oncologist (Medical Oncology) Brien Mates, RN as Oncology Nurse Navigator (Oncology) Barbaraann Faster, RN as [place] [facility]   Chief Complaint:  off balance (Patient states that it has been ongoing but has gotten worse ) and Wrist Pain (Bilateral wrist pain x 2 months after getting fluid )   HPI: Samantha Mcfarland is a 67 y.o. female presenting on 07/27/2022 for off balance (Patient states that it has been ongoing but has gotten worse ) and Wrist Pain (Bilateral wrist pain x 2 months after getting fluid )   Pt presents today for evaluation of swelling and pain to her right wrist. States this has been intermittent for several months. Steroids in the past have been beneficial. States she also has ongoing ringing in both ears and balance issues. This has been ongoing for several years, gets worse at times. No other reported neurological deficits. 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 She does wear bilateral hearing aids. She also has a history of hyponatremia and was recently started on sodium repletion therapy.   Wrist Pain  The pain is present in the right wrist. This is a recurrent problem. The current episode started more than 1 month ago. The problem occurs intermittently. The problem has been waxing and waning. The quality of the pain is described as aching and burning. The pain is moderate. Associated symptoms include joint swelling and tingling. Pertinent negatives include no fever, inability to bear weight, itching, joint locking, limited range of motion, numbness or stiffness. The symptoms are aggravated by activity. She has tried nothing for the symptoms. The treatment provided no relief.     Relevant past medical, surgical,  family, and social history reviewed and updated as indicated.  Allergies and medications reviewed and updated. Data reviewed: Chart in Epic.   Past Medical History:  Diagnosis Date   Allergy    Anxiety    Breast cancer ([place])    Depression    Family history of breast cancer    Family history of colon cancer    Family history of pancreatic cancer    Family history of thyroid cancer    GERD (gastroesophageal reflux disease)    Hyperlipidemia    Hypertension    Hyponatremia    Osteopenia 04/30/2021   Port-A-Cath in place 05/07/2021   Vitamin D insufficiency     Past Surgical History:  Procedure Laterality Date   EXCISION OF SKIN TAG Right 05/27/2021   Procedure: EXCISION OF RIGHT SKIN TAG;  Surgeon: Virl Cagey, MD;  Location: [facility];  Service: General;  Laterality: Right;   MASTECTOMY W/ SENTINEL NODE BIOPSY Left 03/23/2021   Procedure: TOTAL MASTECTOMY WITH SENTINEL LYMPH NODE BIOPSY;  Surgeon: Virl Cagey, MD;  Location: [facility];  Service: General;  Laterality: Left;   NECK SURGERY     OOPHORECTOMY     PORT-A-CATH REMOVAL Right 09/23/2021   Procedure: MINOR REMOVAL PORT-A-CATH;  Surgeon: Virl Cagey, MD;  Location: [facility];  Service: General;  Laterality: Right;   PORTACATH PLACEMENT Right 05/27/2021   Procedure: INSERTION PORT-A-CATH;  Surgeon: Virl Cagey, MD;  Location: [facility];  Service: General;  Laterality: Right;   SALPINGECTOMY      Social History   Socioeconomic History  Marital status: Divorced    Spouse name: Not on file   Number of children: 0   Years of education: Not on file   Highest education level: Not on file  Occupational History   Occupation: retired  Tobacco Use   Smoking status: Former    Packs/day: 0.25    Types: Cigarettes   Smokeless tobacco: Never   Tobacco comments:    1 pack every 3-4 days  Vaping Use   Vaping Use: Never used  Substance and Sexual Activity   Alcohol use: Yes    Alcohol/week: 20.0 standard  drinks of alcohol    Types: 20 Cans of beer per week    Comment: 3-4 beers per day   Drug use: Never   Sexual activity: Not Currently    Birth control/protection: Post-menopausal  Other Topics Concern   Not on file  Social History Narrative   Sister lives about 15 minutes away   She reports claustrophobia- not liking closed in spaces   03/26/22 reports she stopped smoking   Does not have internet Her sister Santiago Glad manages her my chart account    Social Determinants of Health   Financial Resource Strain: Low Risk  (05/24/2022)   Overall Financial Resource Strain (CARDIA)    Difficulty of Paying Living Expenses: Not hard at all  Food Insecurity: No Food Insecurity (06/03/2022)   Hunger Vital Sign    Worried About Running Out of Food in the Last Year: Never true    Ran Out of Food in the Last Year: Never true  Transportation Needs: No Transportation Needs (06/03/2022)   PRAPARE - Hydrologist (Medical): No    Lack of Transportation (Non-Medical): No  Physical Activity: Sufficiently Active (05/24/2022)   Exercise Vital Sign    Days of Exercise per Week: 5 days    Minutes of Exercise per Session: 30 min  Stress: No Stress Concern Present (05/24/2022)   [place]    Feeling of Stress : Not at all  Social Connections: Socially Isolated (05/24/2022)   Social Connection and Isolation Panel [NHANES]    Frequency of Communication with Friends and Family: More than three times a week    Frequency of Social Gatherings with Friends and Family: More than three times a week    Attends Religious Services: Never    Marine scientist or Organizations: No    Attends Archivist Meetings: Never    Marital Status: Divorced  Human resources officer Violence: Not At Risk (05/30/2022)   Humiliation, Afraid, Rape, and Kick questionnaire    Fear of Current or Ex-Partner: No    Emotionally Abused: No     Physically Abused: No    Sexually Abused: No    Outpatient Encounter Medications as of 07/27/2022  Medication Sig   acetaminophen (TYLENOL) 325 MG tablet Take 325 mg by mouth every 6 (six) hours as needed for moderate pain or headache.   anastrozole (ARIMIDEX) 1 MG tablet Take 1 tablet (1 mg total) by mouth daily.   atorvastatin (LIPITOR) 20 MG tablet Take 1 tablet (20 mg total) by mouth daily. (NEEDS TO BE SEEN BEFORE NEXT REFILL)   cholecalciferol (VITAMIN D3) 25 MCG (1000 UNIT) tablet Take 1,000 Units by mouth daily.   diphenhydrAMINE HCl (ALLERGY MEDICATION PO) Take 1 tablet by mouth as needed (allergies).   folic acid (FOLVITE) 1 MG tablet Take 1 tablet (1 mg total) by mouth daily.  furosemide (LASIX) 20 MG tablet Take 0.5 tablets by mouth 2 (two) times daily.   gabapentin (NEURONTIN) 300 MG capsule TAKE 1 CAPSULE 2 TIMES A DAY (Patient taking differently: Take 300 mg by mouth 2 (two) times daily.)   loperamide (IMODIUM) 2 MG capsule Take 1 capsule (2 mg total) by mouth as needed for diarrhea or loose stools.   Multiple Vitamin (MULTIVITAMIN WITH MINERALS) TABS tablet Take 1 tablet by mouth daily.   ondansetron (ZOFRAN) 4 MG tablet Take 1 tablet (4 mg total) by mouth every 8 (eight) hours as needed for nausea or vomiting.   predniSONE (DELTASONE) 20 MG tablet Take 2 tablets (40 mg total) by mouth daily with breakfast for 5 days.   sodium chloride 1 g tablet Take by mouth.   thiamine (VITAMIN B-1) 100 MG tablet Take 1 tablet (100 mg total) by mouth daily.   vitamin B-12 (CYANOCOBALAMIN) 500 MCG tablet Take 500 mcg by mouth daily.   No facility-administered encounter medications on file as of 07/27/2022.    Allergies  Allergen Reactions   Penicillins Rash and Anaphylaxis   Ibuprofen Other (See Comments)    "Stomach upset"    Review of Systems  Constitutional:  Negative for activity change, appetite change, chills, diaphoresis, fatigue, fever and unexpected weight change.  HENT:   Positive for hearing loss and tinnitus. Negative for congestion, dental problem, drooling, ear discharge, ear pain, facial swelling, mouth sores, nosebleeds, postnasal drip, rhinorrhea, sinus pressure, sinus pain, sneezing, sore throat, trouble swallowing and voice change.   Eyes: Negative.  Negative for photophobia and visual disturbance.  Respiratory:  Negative for cough, chest tightness and shortness of breath.   Cardiovascular:  Negative for chest pain, palpitations and leg swelling.  Gastrointestinal:  Negative for abdominal pain, blood in stool, constipation, diarrhea, nausea and vomiting.  Endocrine: Negative.   Genitourinary:  Negative for decreased urine volume, difficulty urinating, dysuria, frequency and urgency.  Musculoskeletal:  Positive for arthralgias and joint swelling. Negative for back pain, gait problem, myalgias, neck pain, neck stiffness and stiffness.  Skin: Negative.  Negative for itching.  Allergic/Immunologic: Negative.   Neurological:  Positive for dizziness and tingling. Negative for tremors, seizures, syncope, facial asymmetry, speech difficulty, light-headedness, numbness and headaches.  Hematological: Negative.   Psychiatric/Behavioral:  Negative for confusion, hallucinations, sleep disturbance and suicidal ideas.   All other systems reviewed and are negative.       Objective:  BP 115/69   Pulse 65   Temp (!) 96.9 F (36.1 C) (Temporal)   Ht '5\' 6"'$  (1.676 m)   Wt 121 lb 9.6 oz (55.2 kg)   SpO2 100%   BMI 19.63 kg/m    Wt Readings from Last 3 Encounters:  07/27/22 121 lb 9.6 oz (55.2 kg)  06/10/22 118 lb (53.5 kg)  05/31/22 120 lb 5.9 oz (54.6 kg)    Physical Exam Vitals and nursing note reviewed.  Constitutional:      General: She is not in acute distress.    Appearance: Normal appearance. She is well-developed, well-groomed and normal weight. She is not ill-appearing (chronically), toxic-appearing or diaphoretic.  HENT:     Head: Normocephalic  and atraumatic.     Jaw: There is normal jaw occlusion.     Right Ear: Decreased hearing noted. There is impacted cerumen.     Left Ear: Decreased hearing noted. There is impacted cerumen.     Ears:     Comments: Bilateral hearing aids    Nose: Nose normal.  Mouth/Throat:     Lips: Pink.     Mouth: Mucous membranes are moist.     Pharynx: Oropharynx is clear. Uvula midline.  Eyes:     General: Lids are normal.     Extraocular Movements: Extraocular movements intact.     Conjunctiva/sclera: Conjunctivae normal.     Pupils: Pupils are equal, round, and reactive to light.  Neck:     Thyroid: No thyroid mass, thyromegaly or thyroid tenderness.     Vascular: No carotid bruit or JVD.     Trachea: Trachea and phonation normal.  Cardiovascular:     Rate and Rhythm: Normal rate and regular rhythm.     Chest Wall: PMI is not displaced.     Pulses: Normal pulses.     Heart sounds: Normal heart sounds. No murmur heard.    No friction rub. No gallop.  Pulmonary:     Effort: Pulmonary effort is normal. No respiratory distress.     Breath sounds: Normal breath sounds. No wheezing.  Abdominal:     General: There is no abdominal bruit.     Palpations: There is no hepatomegaly or splenomegaly.  Musculoskeletal:        General: Normal range of motion.     Right forearm: Normal.     Left forearm: Normal.     Right wrist: Swelling and tenderness present. No deformity, effusion, lacerations, bony tenderness, snuff box tenderness or crepitus. Normal range of motion. Normal pulse.     Left wrist: Normal.     Right hand: Normal.     Left hand: Normal.       Arms:     Cervical back: Normal range of motion and neck supple.     Right lower leg: No edema.     Left lower leg: No edema.  Lymphadenopathy:     Cervical: No cervical adenopathy.  Skin:    General: Skin is warm and dry.     Capillary Refill: Capillary refill takes less than 2 seconds.     Coloration: Skin is not cyanotic, jaundiced  or pale.     Findings: No rash.  Neurological:     General: No focal deficit present.     Mental Status: She is alert and oriented to person, place, and time.     Cranial Nerves: No cranial nerve deficit.     Sensory: Sensation is intact. No sensory deficit.     Motor: Motor function is intact. No weakness, tremor or seizure activity.     Coordination: Coordination is intact. Coordination normal.     Gait: Gait abnormal (using cane).     Deep Tendon Reflexes: Reflexes are normal and symmetric. Reflexes normal.  Psychiatric:        Attention and Perception: Attention and perception normal.        Mood and Affect: Mood and affect normal.        Speech: Speech normal.        Behavior: Behavior normal. Behavior is cooperative.        Thought Content: Thought content normal.        Cognition and Memory: Cognition and memory normal.        Judgment: Judgment normal.     Results for orders placed or performed during the hospital encounter of 07/07/22  Urine Culture   Specimen: Urine, Clean Catch  Result Value Ref Range   Specimen Description      URINE, CLEAN CATCH Performed at [facility], [address]  [place], [place] [address]    Special Requests      NONE Performed at [facility], [address] [place], [place] [address]    Culture (A)     <10,000 COLONIES/mL INSIGNIFICANT GROWTH Performed at [place] [address] [place], [place] [address]    Report Status 07/08/2022 FINAL   Urinalysis, Routine w reflex microscopic Urine, Clean Catch  Result Value Ref Range   Color, Urine YELLOW YELLOW   APPearance CLEAR CLEAR   Specific Gravity, Urine 1.011 1.005 - 1.030   pH 5.0 5.0 - 8.0   Glucose, UA NEGATIVE NEGATIVE mg/dL   Hgb urine dipstick NEGATIVE NEGATIVE   Bilirubin Urine NEGATIVE NEGATIVE   Ketones, ur NEGATIVE NEGATIVE mg/dL   Protein, ur NEGATIVE NEGATIVE mg/dL   Nitrite NEGATIVE NEGATIVE   Leukocytes,Ua MODERATE (A) NEGATIVE   RBC / HPF 0-5 0 - 5  RBC/hpf   WBC, UA 11-20 0 - 5 WBC/hpf   Bacteria, UA NONE SEEN NONE SEEN   Squamous Epithelial / HPF 0-5 0 - 5 /HPF   Mucus PRESENT   Osmolality, urine  Result Value Ref Range   Osmolality, Ur 297 (L) 300 - 900 mOsm/kg  Osmolality  Result Value Ref Range   Osmolality 291 275 - 295 mOsm/kg  Creatinine, urine, random  Result Value Ref Range   Creatinine, Urine 65.17 mg/dL  Sodium, urine, random  Result Value Ref Range   Sodium, Ur 16 mmol/L  CBC  Result Value Ref Range   WBC 4.7 4.0 - 10.5 K/uL   RBC 3.58 (L) 3.87 - 5.11 MIL/uL   Hemoglobin 11.9 (L) 12.0 - 15.0 g/dL   HCT 35.0 (L) 36.0 - 46.0 %   MCV 97.8 80.0 - 100.0 fL   MCH 33.2 26.0 - 34.0 pg   MCHC 34.0 30.0 - 36.0 g/dL   RDW 13.6 11.5 - 15.5 %   Platelets 294 150 - 400 K/uL   nRBC 0.0 0.0 - 0.2 %  Renal function panel  Result Value Ref Range   Sodium 135 135 - 145 mmol/L   Potassium 3.9 3.5 - 5.1 mmol/L   Chloride 99 98 - 111 mmol/L   CO2 28 22 - 32 mmol/L   Glucose, Bld 86 70 - 99 mg/dL   BUN 11 8 - 23 mg/dL   Creatinine, Ser 0.70 0.44 - 1.00 mg/dL   Calcium 9.1 8.9 - 10.3 mg/dL   Phosphorus 3.6 2.5 - 4.6 mg/dL   Albumin 4.3 3.5 - 5.0 g/dL   GFR, Estimated >60 >60 mL/min   Anion gap 8 5 - 15  Protein / creatinine ratio, urine  Result Value Ref Range   Creatinine, Urine 64.79 mg/dL   Total Protein, Urine <6 mg/dL   Protein Creatinine Ratio        0.00 - 0.15 mg/mg[Cre]       Pertinent labs & imaging results that were available during my care of the patient were reviewed by me and considered in my medical decision making.  Assessment & Plan:  Jarrah was seen today for off balance and wrist pain.  Diagnoses and all orders for this visit:  Tinnitus of both ears Bilateral impacted cerumen Cerumen impaction of bilateral ears. Tried to irrigate and remove cerumen in office and pt was unable to tolerate. Will make sure sodium has not dropped again. Referral to ENT placed.  -     BMP8+EGFR -     Ambulatory  referral to ENT  Balance  problem Concerned this is due to inner ear issues or ototoxicity related to chemotherapy. Could be due to hyponatremia also. Will check BMP and refer to ENT for further evaluation. No indications of acute cerebrovascular problems.  -     BMP8+EGFR -     Ambulatory referral to ENT  Hyponatremia Will repeat labs today as this could be causing dizziness and balance issues.  -     BMP8+EGFR  Ganglion cyst of dorsum of right wrist Will burst with steroids to see if beneficial. If not, will refer to ortho.  -     predniSONE (DELTASONE) 20 MG tablet; Take 2 tablets (40 mg total) by mouth daily with breakfast for 5 days.     Continue all other maintenance medications.  Follow up plan: Return in about 4 weeks (around 08/24/2022), or if symptoms worsen or fail to improve, for ganglion cyst.   Continue healthy lifestyle choices, including diet (rich in fruits, vegetables, and lean proteins, and low in salt and simple carbohydrates) and exercise (at least 30 minutes of moderate physical activity daily).  Educational handout given for tinnitus, ganglion cyst  The above assessment and management plan was discussed with the patient. The patient verbalized understanding of and has agreed to the management plan. Patient is aware to call the clinic if they develop any new symptoms or if symptoms persist or worsen. Patient is aware when to return to the clinic for a follow-up visit. Patient educated on when it is appropriate to go to the emergency department.   Monia Pouch, FNP-C [facility] [phone]

## 2022-07-29 ENCOUNTER — Ambulatory Visit: Payer: 59 | Admitting: *Deleted

## 2022-07-29 NOTE — Patient Outreach (Addendum)
Care Coordination   Follow Up Visit Note   07/29/2022 Name: Samantha Mcfarland MRN: [id number] DOB: [birth year]  Para [name] is a 67 y.o. year old female who sees Rakes, Connye Burkitt, FNP for primary care. I spoke with  Samantha Mcfarland by phone today.  What matters to the patients health and wellness today?  Concern with continued low sodium values history of hyponatremia, recently started on sodium repletion therapy Voiced concern with the history of Chemical imbalance per previous Dr [name], neurologist Voices she wants Pcp to call in medicine for her physical and chemical  imbalance and to assist with a referral to neurologist.  No noted medication listed for ataxia/dizziness. She uses a cane for ambulation.  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 She was referred to ENT pending authorization   Goals Addressed               This Visit's Progress     Patient Stated     Improve or manage memory [facility]) (pt-stated)   Not on track     Care Coordination Interventions: Patient interviewed about adult health maintenance status including  memory changes Advised patient to discuss  memory changes with primary care provider  Provided education about about memory changes, Neurology and speech services for management,  Answered her questions about medications to assist with memory loss Discussed and administered part of the MMSE (mini mental state examination) task of having the patient to memorize a few words and then repeating the list later. She was able to repeat 3 out of 5 words She was able to tell RN CM where she was (home). She was not able to identify the current day of the week or month but could verbalize the year. She spoke in complete sentences. History of general anxiety "my nerves have always been bad" Discussed neurology services Discussed home health speech services but not preferred by patient  related to her "barking protective dog"      manage back pain ([facility]) (pt-stated)   On track     Care Coordination Interventions: Counseled on the importance of reporting any/all new or changed pain symptoms or management strategies to pain management provider Discussed use of relaxation techniques and/or diversional activities to assist with pain reduction (distraction, imagery, relaxation, massage, acupressure, TENS, heat, and cold application Sent in basket message to pcp with patient permission       Manage hypertension ([facility]) (pt-stated)   On track     Care Coordination Interventions: Evaluation of current treatment plan related to hypertension self management and patient's adherence to plan as established by provider Discussed plans with patient for ongoing care management follow up and provided patient with direct contact information for care management team      Manage hyponatremia/liver lesion [facility]) (pt-stated)   Not on track     Care Coordination Interventions: Evaluation of current treatment plan related to hyponatremia. Liver lesion and patient's adherence to plan as established by provider Discussed plans with patient for ongoing care management follow up and provided patient with direct contact information for care management team Assessed for worsening hyponatremia symptoms. Assessed the intake or reduction of sodium and fluids as her 07/27/22 was down to 129 Reviewed her sodium value trends, standard values for sodium Discussed her intake of lasix and sodium tablets Discussed the pcp office pharmacy staff that could be consulted about her dosages of Lasix and sodium tablets + possible side  effects from any of her medicines Confirmed she is to have more labs repeated on 07/30/22        Manage patient reported balance and chemical imbalance (chemical imbalance/etoh/cigarette) (pt-stated)   Not on track     Care Coordination Interventions: Advised patient to discuss her concerns  with physical and chemical imbalance with provider Patient interviewed about adult health maintenance status including  her physical and chemical imbalance Confirms she reports previously working with Dr [name], neurologist "years ago" RN CM unable to find notes for Dr [name] nor any neurology notes in [facility]  Advised patient to discuss  her "bad nerves" with primary care provider  Confirmed with patient that she has decreased her intake of alcohol but continues to smoke          SDOH assessments and interventions completed:  No     Care Coordination Interventions:  Yes, provided  Interventions Today    Flowsheet Row Most Recent Value  Chronic Disease Discussed/Reviewed   Chronic disease discussed/reviewed during today's visit Chronic Kidney Disease/End Stage Renal Disease (ESRD)  General Interventions   General Interventions Discussed/Reviewed General Interventions Discussed  Exercise Interventions   Exercise Discussed/Reviewed Exercise Discussed, Physical Activity  Physical Activity Discussed/Reviewed Home Exercise Program (HEP), Physical Activity Discussed  Education Interventions   Education Provided Provided Verbal Education  Provided Verbal Education On Mental Health/Coping with Illness, When to see the doctor  Mental Health Interventions   Mental Health Discussed/Reviewed Coping Strategies  Nutrition Interventions   Nutrition Discussed/Reviewed Nutrition Discussed, Fluid intake  [fluid and sodium intake balance, possible medicines causing low sodium]  Pharmacy Interventions   Pharmacy Dicussed/Reviewed Pharmacy Topics Discussed, Medications and their functions  [pcp pharmacist to review medicines to prevent low sodium, [place] for pill packing availability]  Safety Interventions   Safety Discussed/Reviewed Safety Discussed, Fall Risk, Home Safety  Home Safety Assistive Devices       Follow up plan: Follow up call scheduled for 08/25/22    Encounter Outcome:  Pt.  Visit Completed   Rondel Episcopo L. Lavina Hamman, RN, BSN, [place] Coordinator Office number [phone]

## 2022-07-29 NOTE — Patient Instructions (Signed)
Visit Information  Thank you for taking time to visit with me today. Please don't hesitate to contact me if I can be of assistance to you.   Following are the goals we discussed today:   Goals Addressed               This Visit's Progress     Patient Stated     Improve or manage memory [facility]) (pt-stated)   Not on track     Care Coordination Interventions: Patient interviewed about adult health maintenance status including  memory changes Advised patient to discuss  memory changes with primary care provider  Provided education about about memory changes, Neurology and speech services for management,  Answered her questions about medications to assist with memory loss Discussed and administered part of the MMSE (mini mental state examination) task of having the patient to memorize a few words and then repeating the list later. She was able to repeat 3 out of 5 words She was able to tell RN CM where she was (home). She was not able to identify the current day of the week or month but could verbalize the year. She spoke in complete sentences. History of general anxiety "my nerves have always been bad" Discussed neurology services Discussed home health speech services but not preferred by patient related to her "barking protective dog"      manage back pain ([facility]) (pt-stated)   On track     Care Coordination Interventions: Counseled on the importance of reporting any/all new or changed pain symptoms or management strategies to pain management provider Discussed use of relaxation techniques and/or diversional activities to assist with pain reduction (distraction, imagery, relaxation, massage, acupressure, TENS, heat, and cold application Sent in basket message to pcp with patient permission       Manage hypertension ([facility]) (pt-stated)   On track     Care Coordination Interventions: Evaluation of current treatment plan related to hypertension self management and patient's adherence to plan as  established by provider Discussed plans with patient for ongoing care management follow up and provided patient with direct contact information for care management team      Manage hyponatremia/liver lesion [facility]) (pt-stated)   Not on track     Care Coordination Interventions: Evaluation of current treatment plan related to hyponatremia. Liver lesion and patient's adherence to plan as established by provider Discussed plans with patient for ongoing care management follow up and provided patient with direct contact information for care management team Assessed for worsening hyponatremia symptoms. Assessed the intake or reduction of sodium and fluids as her 07/27/22 was down to 129 Reviewed her sodium value trends, standard values for sodium Discussed her intake of lasix and sodium tablets Discussed the pcp office pharmacy staff that could be consulted about her dosages of Lasix and sodium tablets + possible side effects from any of her medicines Confirmed she is to have more labs repeated on 07/30/22        Manage patient reported balance and chemical imbalance (chemical imbalance/etoh/cigarette) (pt-stated)   Not on track     Care Coordination Interventions: Advised patient to discuss her concerns with physical and chemical imbalance with provider Patient interviewed about adult health maintenance status including  her physical and chemical imbalance Confirms she reports previously working with Dr [name], neurologist "years ago" RN CM unable to find notes for Dr [name] nor any neurology notes in [facility]  Advised patient to discuss  her "bad nerves" with primary care provider  Confirmed  with patient that she has decreased her intake of alcohol but continues to smoke          Our next appointment is by telephone on 08/25/22 at 0930  Please call the care guide team at [phone] if you need to cancel or reschedule your appointment.   If you are experiencing a Mental Health or [place] or need someone to talk to, please call the Suicide and Crisis Lifeline: 988 call the [place] National Suicide Prevention Lifeline: (570) 413-0894 or TTY: 337-115-3566 TTY 207-249-3766) to talk to a trained counselor call 1-800-273-TALK (toll free, 24 hour hotline) call the [facility]: 508 207 6712 call 911   Patient verbalizes understanding of instructions and care plan provided today and agrees to view in [place]. Active MyChart status and patient understanding of how to access instructions and care plan via MyChart confirmed with patient.     The patient has been provided with contact information for the care management team and has been advised to call with any health related questions or concerns.    Hymen Arnett L. Lavina Hamman, RN, BSN, [place] Coordinator Office number [phone]

## 2022-07-30 ENCOUNTER — Other Ambulatory Visit: Payer: 59

## 2022-07-30 DIAGNOSIS — E871 Hypo-osmolality and hyponatremia: Secondary | ICD-10-CM | POA: Diagnosis not present

## 2022-07-30 DIAGNOSIS — E782 Mixed hyperlipidemia: Secondary | ICD-10-CM

## 2022-07-31 LAB — BMP8+EGFR
BUN/Creatinine Ratio: 10 — ABNORMAL LOW (ref 12–28)
BUN: 9 mg/dL (ref 8–27)
CO2: 23 mmol/L (ref 20–29)
Calcium: 9.8 mg/dL (ref 8.7–10.3)
Chloride: 99 mmol/L (ref 96–106)
Creatinine, Ser: 0.92 mg/dL (ref 0.57–1.00)
Glucose: 121 mg/dL — ABNORMAL HIGH (ref 70–99)
Potassium: 4.5 mmol/L (ref 3.5–5.2)
Sodium: 137 mmol/L (ref 134–144)
eGFR: 69 mL/min/{1.73_m2} (ref >59)

## 2022-08-04 ENCOUNTER — Telehealth: Payer: Self-pay | Admitting: Family Medicine

## 2022-08-04 NOTE — Telephone Encounter (Signed)
Patient wants to talk to nurse about labs from 2/9 - please call back.

## 2022-08-04 NOTE — Telephone Encounter (Signed)
Reviewed labs with patient and answered questions.

## 2022-08-19 ENCOUNTER — Other Ambulatory Visit: Payer: Self-pay | Admitting: Hematology

## 2022-08-24 ENCOUNTER — Ambulatory Visit (INDEPENDENT_AMBULATORY_CARE_PROVIDER_SITE_OTHER): Payer: 59 | Admitting: Family Medicine

## 2022-08-24 ENCOUNTER — Encounter: Payer: Self-pay | Admitting: Family Medicine

## 2022-08-24 VITALS — BP 131/74 | HR 74 | Temp 97.5°F | Ht 66.0 in | Wt 122.4 lb

## 2022-08-24 DIAGNOSIS — M67431 Ganglion, right wrist: Secondary | ICD-10-CM

## 2022-08-24 MED ORDER — PREDNISONE 20 MG PO TABS: 40.0000 mg | ORAL_TABLET | Freq: Every day | ORAL | 0 refills | Status: AC

## 2022-08-24 NOTE — Progress Notes (Signed)
Subjective:  Patient ID: Samantha Mcfarland, female    DOB: 06/21/1956, 67 y.o.   MRN: [id number]  Patient Care Team: Baruch Gouty, FNP as PCP - General (Family Medicine) Derek Jack, MD as Medical Oncologist (Medical Oncology) Brien Mates, RN as Oncology Nurse Navigator (Oncology) Barbaraann Faster, RN as [place] Management   Chief Complaint:  Ganglion Cyst (4 week follow up)   HPI: Samantha Mcfarland is a 67 y.o. female presenting on 08/24/2022 for Ganglion Cyst (4 week follow up)   Pt presents today for reevaluation of ganglion cyst to right wrist. Was seen 4 weeks ago for same and was treated with a 5 day course of steroids. This did help for a few days and then the swelling and tenderness returned. She has not tried anything else for symptom management. Does use this wrist all the time as she ambulates with a cane using the right hand.      Relevant past medical, surgical, family, and social history reviewed and updated as indicated.  Allergies and medications reviewed and updated. Data reviewed: Chart in Epic.   Past Medical History:  Diagnosis Date   Allergy    Anxiety    Breast cancer ([place])    Depression    Family history of breast cancer    Family history of colon cancer    Family history of pancreatic cancer    Family history of thyroid cancer    GERD (gastroesophageal reflux disease)    Hyperlipidemia    Hypertension    Hyponatremia    Osteopenia 04/30/2021   Port-A-Cath in place 05/07/2021   Vitamin D insufficiency     Past Surgical History:  Procedure Laterality Date   EXCISION OF SKIN TAG Right 05/27/2021   Procedure: EXCISION OF RIGHT SKIN TAG;  Surgeon: Virl Cagey, MD;  Location: [facility];  Service: General;  Laterality: Right;   MASTECTOMY W/ SENTINEL NODE BIOPSY Left 03/23/2021   Procedure: TOTAL MASTECTOMY WITH SENTINEL LYMPH NODE BIOPSY;  Surgeon: Virl Cagey, MD;  Location: [facility];  Service: General;   Laterality: Left;   NECK SURGERY     OOPHORECTOMY     PORT-A-CATH REMOVAL Right 09/23/2021   Procedure: MINOR REMOVAL PORT-A-CATH;  Surgeon: Virl Cagey, MD;  Location: [facility];  Service: General;  Laterality: Right;   PORTACATH PLACEMENT Right 05/27/2021   Procedure: INSERTION PORT-A-CATH;  Surgeon: Virl Cagey, MD;  Location: [facility];  Service: General;  Laterality: Right;   SALPINGECTOMY      Social History   Socioeconomic History   Marital status: Divorced    Spouse name: Not on file   Number of children: 0   Years of education: Not on file   Highest education level: Not on file  Occupational History   Occupation: retired  Tobacco Use   Smoking status: Former    Packs/day: 0.25    Types: Cigarettes   Smokeless tobacco: Never   Tobacco comments:    1 pack every 3-4 days  Vaping Use   Vaping Use: Never used  Substance and Sexual Activity   Alcohol use: Yes    Alcohol/week: 20.0 standard drinks of alcohol    Types: 20 Cans of beer per week    Comment: 3-4 beers per day   Drug use: Never   Sexual activity: Not Currently    Birth control/protection: Post-menopausal  Other Topics Concern   Not on file  Social History Narrative   Sister  lives about 15 minutes away   She reports claustrophobia- not liking closed in spaces   03/26/22 reports she stopped smoking   Does not have internet Her sister Santiago Glad manages her my chart account    Social Determinants of Health   Financial Resource Strain: Low Risk  (05/24/2022)   Overall Financial Resource Strain (CARDIA)    Difficulty of Paying Living Expenses: Not hard at all  Food Insecurity: No Food Insecurity (06/03/2022)   Hunger Vital Sign    Worried About Running Out of Food in the Last Year: Never true    Ran Out of Food in the Last Year: Never true  Transportation Needs: No Transportation Needs (06/03/2022)   PRAPARE - Hydrologist (Medical): No    Lack of Transportation  (Non-Medical): No  Physical Activity: Sufficiently Active (05/24/2022)   Exercise Vital Sign    Days of Exercise per Week: 5 days    Minutes of Exercise per Session: 30 min  Stress: No Stress Concern Present (05/24/2022)   [place]    Feeling of Stress : Not at all  Social Connections: Socially Isolated (05/24/2022)   Social Connection and Isolation Panel [NHANES]    Frequency of Communication with Friends and Family: More than three times a week    Frequency of Social Gatherings with Friends and Family: More than three times a week    Attends Religious Services: Never    Marine scientist or Organizations: No    Attends Archivist Meetings: Never    Marital Status: Divorced  Human resources officer Violence: Not At Risk (05/30/2022)   Humiliation, Afraid, Rape, and Kick questionnaire    Fear of Current or Ex-Partner: No    Emotionally Abused: No    Physically Abused: No    Sexually Abused: No    Outpatient Encounter Medications as of 08/24/2022  Medication Sig   acetaminophen (TYLENOL) 325 MG tablet Take 325 mg by mouth every 6 (six) hours as needed for moderate pain or headache.   anastrozole (ARIMIDEX) 1 MG tablet Take 1 tablet (1 mg total) by mouth daily.   atorvastatin (LIPITOR) 20 MG tablet Take 1 tablet (20 mg total) by mouth daily. (NEEDS TO BE SEEN BEFORE NEXT REFILL)   cholecalciferol (VITAMIN D3) 25 MCG (1000 UNIT) tablet Take 1,000 Units by mouth daily.   diphenhydrAMINE HCl (ALLERGY MEDICATION PO) Take 1 tablet by mouth as needed (allergies).   folic acid (FOLVITE) 1 MG tablet Take 1 tablet (1 mg total) by mouth daily.   folic acid (FOLVITE) 1 MG tablet Take by mouth.   furosemide (LASIX) 20 MG tablet Take 0.5 tablets by mouth 2 (two) times daily.   gabapentin (NEURONTIN) 300 MG capsule TAKE 1 CAPSULE 2 TIMES A DAY   loperamide (IMODIUM) 2 MG capsule Take 1 capsule (2 mg total) by mouth as needed  for diarrhea or loose stools.   Multiple Vitamin (MULTIVITAMIN WITH MINERALS) TABS tablet Take 1 tablet by mouth daily.   ondansetron (ZOFRAN) 4 MG tablet Take 1 tablet (4 mg total) by mouth every 8 (eight) hours as needed for nausea or vomiting.   predniSONE (DELTASONE) 20 MG tablet Take 2 tablets (40 mg total) by mouth daily with breakfast for 5 days. 2 po daily for 5 days   sodium chloride 1 g tablet Take by mouth.   thiamine (VITAMIN B-1) 100 MG tablet Take 1 tablet (100 mg total) by mouth  daily.   vitamin B-12 (CYANOCOBALAMIN) 500 MCG tablet Take 500 mcg by mouth daily.   [DISCONTINUED] predniSONE (DELTASONE) 20 MG tablet Take 40 mg by mouth every morning.   No facility-administered encounter medications on file as of 08/24/2022.    Allergies  Allergen Reactions   Penicillins Rash and Anaphylaxis   Ibuprofen Other (See Comments)    "Stomach upset"    Review of Systems  Constitutional:  Negative for activity change, appetite change, chills, diaphoresis, fatigue, fever and unexpected weight change.  Eyes:  Negative for photophobia and visual disturbance.  Respiratory:  Negative for cough and shortness of breath.   Cardiovascular:  Negative for chest pain and leg swelling.  Gastrointestinal:  Negative for abdominal pain.  Genitourinary:  Negative for decreased urine volume and difficulty urinating.  Musculoskeletal:  Positive for arthralgias and joint swelling.  Neurological:  Negative for weakness.  Psychiatric/Behavioral:  Negative for confusion.   All other systems reviewed and are negative.       Objective:  BP 131/74   Pulse 74   Temp (!) 97.5 F (36.4 C) (Temporal)   Ht '5\' 6"'$  (1.676 m)   Wt 122 lb 6.4 oz (55.5 kg)   SpO2 98%   BMI 19.76 kg/m    Wt Readings from Last 3 Encounters:  08/24/22 122 lb 6.4 oz (55.5 kg)  07/27/22 121 lb 9.6 oz (55.2 kg)  06/10/22 118 lb (53.5 kg)    Physical Exam Vitals and nursing note reviewed.  Constitutional:      Appearance:  Normal appearance. She is normal weight.  HENT:     Head: Normocephalic and atraumatic.     Mouth/Throat:     Mouth: Mucous membranes are moist.  Eyes:     Conjunctiva/sclera: Conjunctivae normal.     Pupils: Pupils are equal, round, and reactive to light.  Cardiovascular:     Rate and Rhythm: Normal rate and regular rhythm.  Pulmonary:     Effort: Pulmonary effort is normal.     Breath sounds: Normal breath sounds.  Musculoskeletal:       Arms:  Skin:    General: Skin is warm and dry.     Capillary Refill: Capillary refill takes less than 2 seconds.  Neurological:     General: No focal deficit present.     Mental Status: She is alert and oriented to person, place, and time.     Gait: Gait abnormal (using cane).  Psychiatric:        Mood and Affect: Mood normal.        Behavior: Behavior normal.        Thought Content: Thought content normal.        Judgment: Judgment normal.     Results for orders placed or performed in visit on 07/30/22  BMP8+EGFR  Result Value Ref Range   Glucose 121 (H) 70 - 99 mg/dL   BUN 9 8 - 27 mg/dL   Creatinine, Ser 0.92 0.57 - 1.00 mg/dL   eGFR 69 >59 mL/min/1.73   BUN/Creatinine Ratio 10 (L) 12 - 28   Sodium 137 134 - 144 mmol/L   Potassium 4.5 3.5 - 5.2 mmol/L   Chloride 99 96 - 106 mmol/L   CO2 23 20 - 29 mmol/L   Calcium 9.8 8.7 - 10.3 mg/dL       Pertinent labs & imaging results that were available during my care of the patient were reviewed by me and considered in my medical decision making.  Assessment &  Plan:  Samantha Mcfarland was seen today for ganglion cyst.  Diagnoses and all orders for this visit:  Ganglion cyst of dorsum of right wrist Return of symptoms. Will burst with steroids and refer to ortho for possible removal.  -     predniSONE (DELTASONE) 20 MG tablet; Take 2 tablets (40 mg total) by mouth daily with breakfast for 5 days. 2 po daily for 5 days -     Ambulatory referral to Orthopedic Surgery     Continue all other  maintenance medications.  Follow up plan: Return if symptoms worsen or fail to improve.   Continue healthy lifestyle choices, including diet (rich in fruits, vegetables, and lean proteins, and low in salt and simple carbohydrates) and exercise (at least 30 minutes of moderate physical activity daily).   The above assessment and management plan was discussed with the patient. The patient verbalized understanding of and has agreed to the management plan. Patient is aware to call the clinic if they develop any new symptoms or if symptoms persist or worsen. Patient is aware when to return to the clinic for a follow-up visit. Patient educated on when it is appropriate to go to the emergency department.   Monia Pouch, FNP-C [facility] [phone]

## 2022-08-25 ENCOUNTER — Ambulatory Visit: Payer: Self-pay | Admitting: *Deleted

## 2022-08-25 NOTE — Patient Instructions (Addendum)
Visit Information  Thank you for taking time to visit with me today. Please don't hesitate to contact me if I can be of assistance to you.   Following are the goals we discussed today:   Goals Addressed               This Visit's Progress     Patient Stated     Improve or manage memory [facility]) (pt-stated)   Not on track     Care Coordination Interventions: Patient interviewed about adult health maintenance status including  memory changes Discussed plans with patient for ongoing care management follow up and provided patient with direct contact information for care management team Not assessed this outreach. Discussed neurology services      manage back pain (THN) (pt-stated)   Not on track     Care Coordination Interventions: Counseled on the importance of reporting any/all new or changed pain symptoms or management strategies to pain management provider Discussed plans with patient for ongoing care management follow up and provided patient with direct contact information for care management team Not assessed this outreach.      Manage hypertension (THN) (pt-stated)   On track     Care Coordination Interventions: Discussed plans with patient for ongoing care management follow up and provided patient with direct contact information for care management team Not assessed this outreach.      Manage hyponatremia/liver lesion [facility]) (pt-stated)   Not on track     Care Coordination Interventions: Discussed plans with patient for ongoing care management follow up and provided patient with direct contact information for care management team Not assessed this outreach.       Manage patient reported balance and chemical imbalance (chemical imbalance/etoh/cigarette) (pt-stated)   Not on track     Care Coordination Interventions: Advised patient to discuss her concerns with physical and chemical imbalance with provider Patient interviewed about adult health maintenance status including   her physical and chemical imbalance Confirms she reports previously working with Dr [name], neurologist "years ago" RN CM unable to find notes for Dr [name] nor any neurology notes in [facility]  Advised patient to discuss  her "bad nerves" with primary care provider  Confirmed with patient that she has decreased her intake of alcohol but continues to smoke        Other     Right upper extremity cyst removal [facility])   Not on track     Interventions Today    Flowsheet Row Most Recent Value  Chronic Disease   Chronic disease during today's visit Other  [gangalion cyst removal, pending orthopedic referral, reviewed FYI sent to pcp from 07/29/22 outreach ( neurology consult, chemical imbalance+), A plan for treating allergy/respiratory symptoms at home. She didn't discuss with pcp 3/5 All questions answered]  General Interventions   General Interventions Discussed/Reviewed General Interventions Discussed, [employer], Doctor Visits, Communication with  [discussed the use of her U card benefit to assist with neti pot/navage/saline spray, reviewed the FYI message sent to pcp 08/25/22 via EPIC about concerns from 07/29/22 she reports she forgot on 08/24/22]  Doctor Visits Discussed/Reviewed Doctor Visits Discussed, Doctor Visits Reviewed, PCP, Specialist  [Discussed the pending ortho referral, the process of removing a gangalion cyst, answered questions]  PCP/Specialist Visits Compliance with follow-up visit, Contact provider for referral to  [facility] provider for referral to Specialist  [patient voices interest in a referral to a neurologist]  Communication with PCP/Specialists  Exercise Interventions   Exercise Discussed/Reviewed Physical Activity, Exercise  Reviewed  Nelda Bucks active with walking her dog and lawn care]  Physical Activity Discussed/Reviewed Physical Activity Reviewed, Home Exercise Program (HEP)  Education Interventions   Education Provided --  [on g cyst removal & after care, allergen  triggers (for her trees, her dog, pollen, ? dust)]  Provided Verbal Education On Medication, Other, Personal assistant, Clinical research associate for home management of respiratory symptoms to include over the counter saline spray, flonase, zyrtec, allegra, neti pot, navage, frequent nare cleaning, masks when outside or in contact with others- U Card purchases, Not using benadryl.]  Pharmacy Interventions   Pharmacy Dicussed/Reviewed --  [Discussed with her that most allergy/sinus medicines are now over the counter in her pharmacy/stores]  Safety Interventions   Safety Discussed/Reviewed Safety Reviewed, [place] next appointment is by telephone on 09/15/22 at 1030  Please call the care guide team at [phone] if you need to cancel or reschedule your appointment.   If you are experiencing a Mental Health or [place] or need someone to talk to, please call the Suicide and Crisis Lifeline: 988 call the [place] National Suicide Prevention Lifeline: 310-045-3945 or TTY: (612)665-6389 TTY 220-477-2843) to talk to a trained counselor call 1-800-273-TALK (toll free, 24 hour hotline) call the [facility]: (779)525-6204 call 911   Patient verbalizes understanding of instructions and care plan provided today and agrees to view in [place]. Active MyChart status and patient understanding of how to access instructions and care plan via MyChart confirmed with patient.     The patient has been provided with contact information for the care management team and has been advised to call with any health related questions or concerns.   Zephyra Bernardi L. Lavina Hamman, RN, BSN, [place] Coordinator Office number [phone]

## 2022-08-25 NOTE — Patient Outreach (Signed)
Care Coordination   Follow Up Visit Note   08/25/2022 Name: Samantha Mcfarland MRN: [id number] DOB: [birth year]  Samantha Mcfarland is a 67 y.o. year old female who sees Rakes, Connye Burkitt, FNP for primary care. I spoke with  [name] by phone today.  What matters to the patients health and wellness today?  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 She didn't discuss this with pcp 3/5 All questions answered     Goals Addressed               This Visit's Progress     Patient Stated     Improve or manage memory [facility]) (pt-stated)   Not on track     Care Coordination Interventions: Patient interviewed about adult health maintenance status including  memory changes Discussed plans with patient for ongoing care management follow up and provided patient with direct contact information for care management team Not assessed this outreach. Discussed neurology services      manage back pain ([facility]N) (pt-stated)   Not on track     Care Coordination Interventions: Counseled on the importance of reporting any/all new or changed pain symptoms or management strategies to pain management provider Discussed plans with patient for ongoing care management follow up and provided patient with direct contact information for care management team Not assessed this outreach.      Manage hypertension (THN) (pt-stated)   On track     Care Coordination Interventions: Discussed plans with patient for ongoing care management follow up and provided patient with direct contact information for care management team Not assessed this outreach.      Manage hyponatremia/liver lesion [facility]) (pt-stated)   Not on track     Care  Coordination Interventions: Discussed plans with patient for ongoing care management follow up and provided patient with direct contact information for care management team Not assessed this outreach.       Manage patient reported balance and chemical imbalance (chemical imbalance/etoh/cigarette) (pt-stated)   Not on track     Care Coordination Interventions: Advised patient to discuss her concerns with physical and chemical imbalance with provider Patient interviewed about adult health maintenance status including  her physical and chemical imbalance Confirms she reports previously working with Dr [name], neurologist "years ago" RN CM unable to find notes for Dr [name] nor any neurology notes in [facility]  Advised patient to discuss  her "bad nerves" with primary care provider  Confirmed with patient that she has decreased her intake of alcohol but continues to smoke        Other     Right upper extremity cyst removal [facility])   Not on track     Interventions Today    Flowsheet Row Most Recent Value  Chronic Disease   Chronic disease during today's visit Other  [gangalion cyst removal, pending orthopedic referral, reviewed FYI sent to pcp from 07/29/22 outreach ( neurology consult, chemical imbalance+), A plan for treating allergy/respiratory symptoms at home. She didn't discuss with pcp 3/5 All questions answered]  General Interventions   General Interventions Discussed/Reviewed General Interventions Discussed, [employer], Doctor Visits, Communication with  [discussed the use of her U card benefit to assist with neti pot/navage/saline spray, reviewed  the FYI message sent to pcp 08/25/22 via EPIC about concerns from 07/29/22 she reports she forgot on 08/24/22]  Doctor Visits Discussed/Reviewed Doctor Visits Discussed, Doctor Visits Reviewed, PCP, Specialist  [Discussed the pending ortho referral, the process of removing a gangalion cyst, answered questions]  PCP/Specialist Visits Compliance with  follow-up visit, Contact provider for referral to  [facility] provider for referral to Specialist  [patient voices interest in a referral to a neurologist]  Communication with PCP/Specialists  Exercise Interventions   Exercise Discussed/Reviewed Physical Activity, Exercise Reviewed  [remains active with walking her dog and lawn care]  Physical Activity Discussed/Reviewed Physical Activity Reviewed, Home Exercise Program (HEP)  Education Interventions   Education Provided --  [on g cyst removal & after care, allergen triggers (for her trees, her dog, pollen, ? dust)]  Provided Verbal Education On Medication, Other, Development worker, community, Scientist, physiological for home management of respiratory symptoms to include over the counter saline spray, flonase, zyrtec, allegra, neti pot, navage, frequent nare cleaning, masks when outside or in contact with others- U Card purchases, Not using benadryl.]  Pharmacy Interventions   Pharmacy Dicussed/Reviewed --  [Discussed with her that most allergy/sinus medicines are now over the counter in her pharmacy/stores]  Safety Interventions   Safety Discussed/Reviewed Safety Reviewed, Home Safety  Home Safety Assistive Devices              SDOH assessments and interventions completed:  No     Care Coordination Interventions:  Yes, provided   Follow up plan: Follow up call scheduled for 09/15/22 0930    Encounter Outcome:  Pt. Visit Completed   Wolfgang Finigan L. Noelle Penner, RN, BSN, CCM [facility] Care Management Community Coordinator Office number [phone]

## 2022-09-03 ENCOUNTER — Ambulatory Visit (INDEPENDENT_AMBULATORY_CARE_PROVIDER_SITE_OTHER): Payer: 59 | Admitting: Orthopedic Surgery

## 2022-09-03 ENCOUNTER — Encounter: Payer: Self-pay | Admitting: Orthopedic Surgery

## 2022-09-03 VITALS — BP 121/80 | HR 76 | Ht 66.0 in | Wt 122.0 lb

## 2022-09-03 DIAGNOSIS — M67431 Ganglion, right wrist: Secondary | ICD-10-CM | POA: Diagnosis not present

## 2022-09-03 MED ORDER — PREDNISONE 10 MG (21) PO TBPK: ORAL_TABLET | ORAL | 0 refills | Status: DC

## 2022-09-03 NOTE — Progress Notes (Unsigned)
New Patient Visit  Assessment: Samantha Mcfarland is a 67 y.o. female with the following: 1. Ganglion cyst of volar aspect of right wrist ***   Plan: Violeta Pawlicki   Follow-up: Return if symptoms worsen or fail to improve.  Subjective:  Chief Complaint  Patient presents with   New Patient (Initial Visit)   Wrist Problem    RT ganglion cyst Has been there a couple of months/ also thinks she is getting them on the left wrist    History of Present Illness: Samantha Mcfarland is a 67 y.o. female who {Presentation:27320} for evaluation of    Review of Systems: No fevers or chills*** No numbness or tingling No chest pain No shortness of breath No bowel or bladder dysfunction No GI distress No headaches   Medical History:  Past Medical History:  Diagnosis Date   Allergy    Anxiety    Breast cancer ([place])    Depression    Family history of breast cancer    Family history of colon cancer    Family history of pancreatic cancer    Family history of thyroid cancer    GERD (gastroesophageal reflux disease)    Hyperlipidemia    Hypertension    Hyponatremia    Osteopenia 04/30/2021   Port-A-Cath in place 05/07/2021   Vitamin D insufficiency     Past Surgical History:  Procedure Laterality Date   EXCISION OF SKIN TAG Right 05/27/2021   Procedure: EXCISION OF RIGHT SKIN TAG;  Surgeon: Virl Cagey, MD;  Location: [facility];  Service: General;  Laterality: Right;   MASTECTOMY W/ SENTINEL NODE BIOPSY Left 03/23/2021   Procedure: TOTAL MASTECTOMY WITH SENTINEL LYMPH NODE BIOPSY;  Surgeon: Virl Cagey, MD;  Location: [facility];  Service: General;  Laterality: Left;   NECK SURGERY     OOPHORECTOMY     PORT-A-CATH REMOVAL Right 09/23/2021   Procedure: MINOR REMOVAL PORT-A-CATH;  Surgeon: Virl Cagey, MD;  Location: [facility];  Service: General;  Laterality: Right;   PORTACATH PLACEMENT Right 05/27/2021   Procedure: INSERTION PORT-A-CATH;  Surgeon: Virl Cagey, MD;  Location:  [facility];  Service: General;  Laterality: Right;   SALPINGECTOMY      Family History  Problem Relation Age of Onset   Stroke Mother    Alcohol abuse Mother    Diabetes Father    Pancreatic cancer Father 39   Testicular cancer Father        dx 38s   Pancreatic disease Father    Breast cancer Maternal Aunt        dx 50s-60s   Breast cancer Paternal Aunt 84   Thyroid cancer Paternal Uncle    Breast cancer Maternal Grandmother        dx 88s   Colon cancer Paternal Grandfather        dx 37s   Social History   Tobacco Use   Smoking status: Former    Packs/day: .25    Types: Cigarettes   Smokeless tobacco: Never   Tobacco comments:    1 pack every 3-4 days  Vaping Use   Vaping Use: Never used  Substance Use Topics   Alcohol use: Yes    Alcohol/week: 20.0 standard drinks of alcohol    Types: 20 Cans of beer per week    Comment: 3-4 beers per day   Drug use: Never    Allergies  Allergen Reactions   Penicillins Rash and Anaphylaxis   Ibuprofen Other (See Comments)    "  Stomach upset"    Current Meds  Medication Sig   acetaminophen (TYLENOL) 325 MG tablet Take 325 mg by mouth every 6 (six) hours as needed for moderate pain or headache.   anastrozole (ARIMIDEX) 1 MG tablet Take 1 tablet (1 mg total) by mouth daily.   atorvastatin (LIPITOR) 20 MG tablet Take 1 tablet (20 mg total) by mouth daily. (NEEDS TO BE SEEN BEFORE NEXT REFILL)   cholecalciferol (VITAMIN D3) 25 MCG (1000 UNIT) tablet Take 1,000 Units by mouth daily.   diphenhydrAMINE HCl (ALLERGY MEDICATION PO) Take 1 tablet by mouth as needed (allergies).   folic acid (FOLVITE) 1 MG tablet Take 1 tablet (1 mg total) by mouth daily.   folic acid (FOLVITE) 1 MG tablet Take by mouth.   furosemide (LASIX) 20 MG tablet Take 0.5 tablets by mouth 2 (two) times daily.   gabapentin (NEURONTIN) 300 MG capsule TAKE 1 CAPSULE 2 TIMES A DAY   loperamide (IMODIUM) 2 MG capsule Take 1 capsule (2 mg total) by mouth as needed for  diarrhea or loose stools.   Multiple Vitamin (MULTIVITAMIN WITH MINERALS) TABS tablet Take 1 tablet by mouth daily.   ondansetron (ZOFRAN) 4 MG tablet Take 1 tablet (4 mg total) by mouth every 8 (eight) hours as needed for nausea or vomiting.   predniSONE (STERAPRED UNI-PAK 21 TAB) 10 MG (21) TBPK tablet 10 mg DS 12 as directed   sodium chloride 1 g tablet Take by mouth.   thiamine (VITAMIN B-1) 100 MG tablet Take 1 tablet (100 mg total) by mouth daily.   vitamin B-12 (CYANOCOBALAMIN) 500 MCG tablet Take 500 mcg by mouth daily.    Objective: BP 121/80   Pulse 76   Ht 5\' 6"  (1.676 m)   Wt 122 lb (55.3 kg)   BMI 19.69 kg/m   Physical Exam:  General: {General PE Findings:25791} Gait: {Gait:25792}    IMAGING: {XR Reviewed:24899}   New Medications:  Meds ordered this encounter  Medications   predniSONE (STERAPRED UNI-PAK 21 TAB) 10 MG (21) TBPK tablet    Sig: 10 mg DS 12 as directed    Dispense:  48 tablet    Refill:  0      Mordecai Rasmussen, MD  09/03/2022 12:45 PM

## 2022-09-15 ENCOUNTER — Ambulatory Visit: Payer: Self-pay | Admitting: *Deleted

## 2022-09-15 NOTE — Patient Outreach (Signed)
  Care Coordination   09/15/2022 Name: Samantha Mcfarland MRN: [id number] DOB: [birth year]   Care Coordination Outreach Attempts:  An unsuccessful telephone outreach was attempted today to offer the patient information about available care coordination services as a benefit of their health plan.   Follow Up Plan:  Additional outreach attempts will be made to offer the patient care coordination information and services.   Encounter Outcome:  No Answer   Care Coordination Interventions:  No, not indicated    Versie Soave L. Lavina Hamman, RN, BSN, [place] Coordinator Office number [phone]

## 2022-09-19 IMAGING — DX DG CHEST 1V PORT
1 series · 1 of 1 positions shown · non-contrast
Comparison: None.

CLINICAL DATA: Port placement, history of breast cancer

EXAM:
PORTABLE CHEST 1 VIEW

[chest ap]
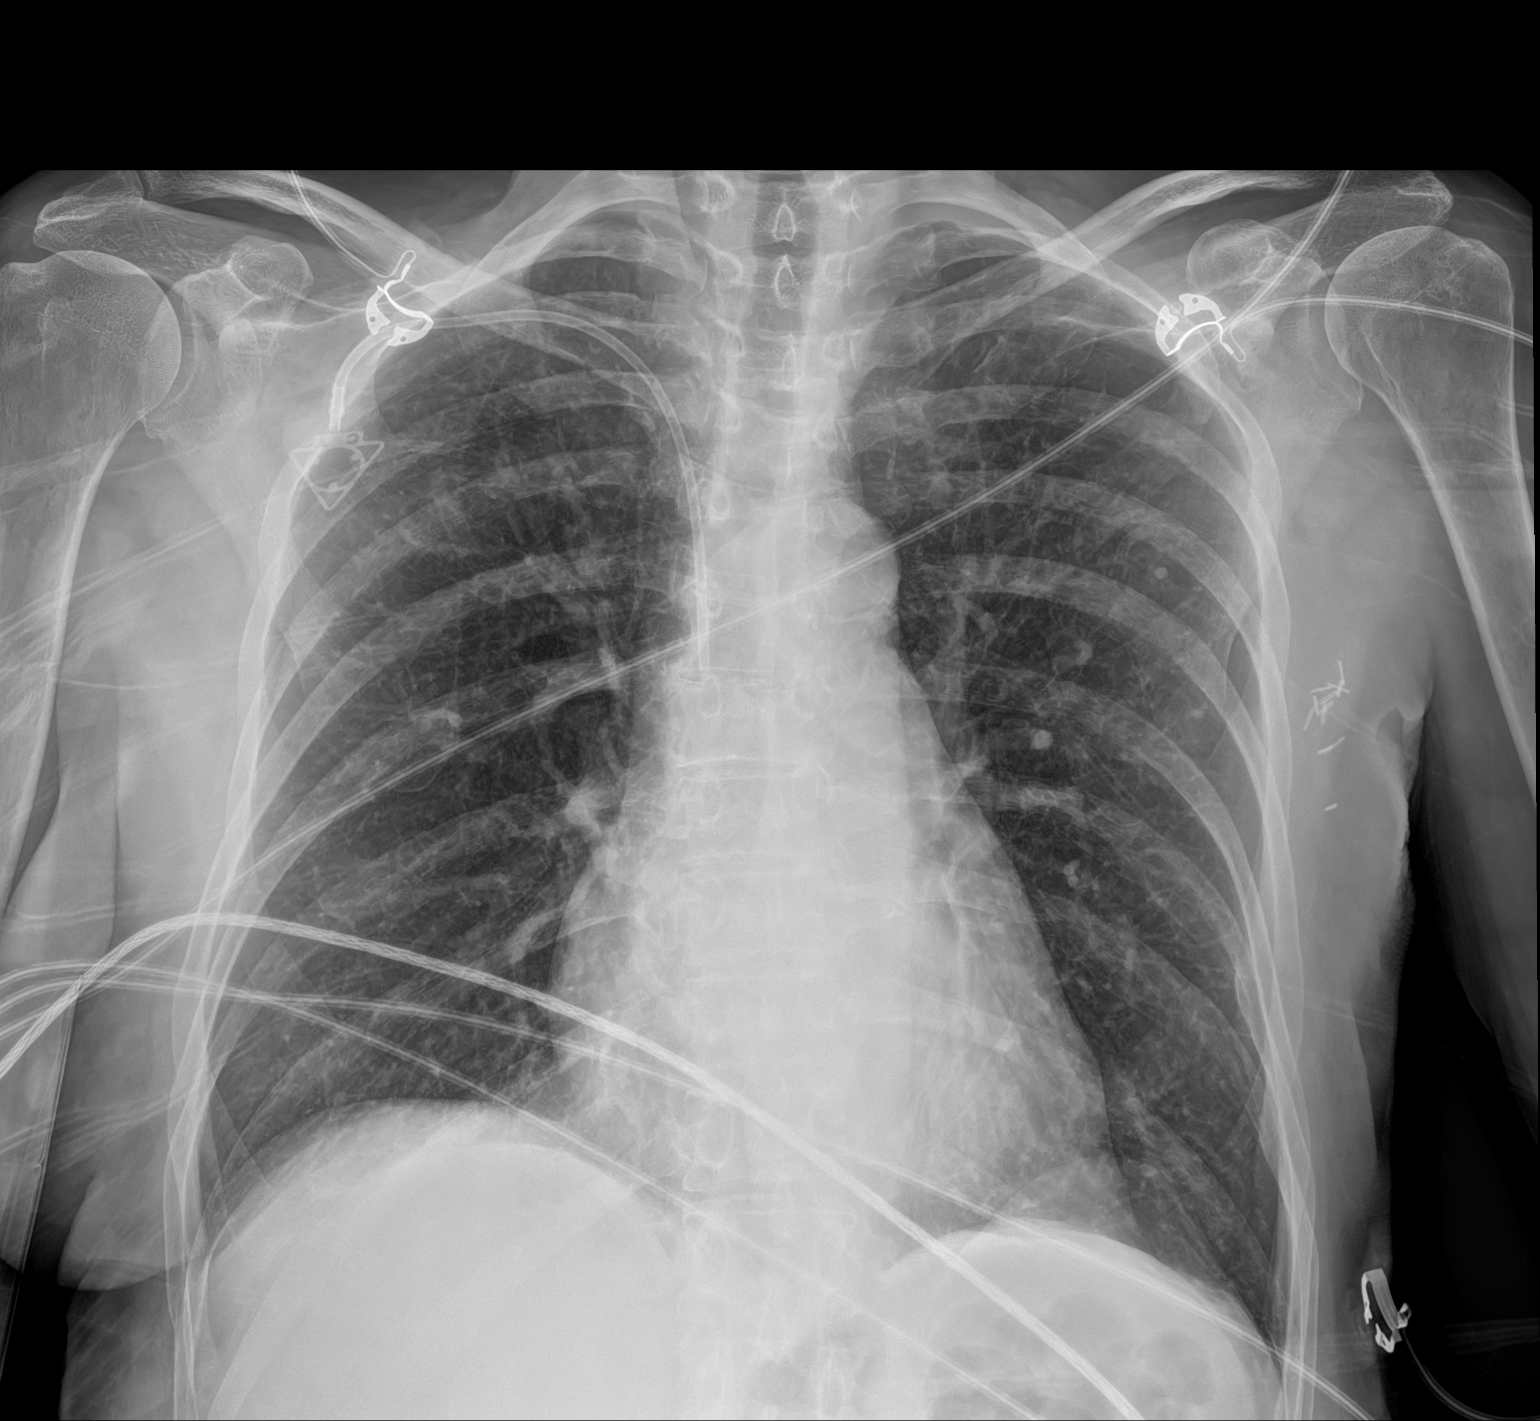

[1 of 1 positions shown; findings below may reference images not displayed]

FINDINGS: There is a right chest wall port in place with the tip in the mid
SVC.

The cardiomediastinal silhouette is normal.

There is no focal consolidation or pulmonary edema. There is no
pleural effusion or pneumothorax.

There is a remote fracture of the left eighth rib. Cervical spine
fusion hardware is noted.
IMPRESSION: Right chest wall port in place with the tip in the mid SVC. No
pneumothorax.

## 2022-09-22 DIAGNOSIS — R42 Dizziness and giddiness: Secondary | ICD-10-CM | POA: Diagnosis not present

## 2022-09-22 DIAGNOSIS — K148 Other diseases of tongue: Secondary | ICD-10-CM | POA: Diagnosis not present

## 2022-09-22 DIAGNOSIS — K047 Periapical abscess without sinus: Secondary | ICD-10-CM | POA: Diagnosis not present

## 2022-09-23 ENCOUNTER — Ambulatory Visit: Payer: 59

## 2022-09-27 ENCOUNTER — Ambulatory Visit: Payer: Self-pay | Admitting: *Deleted

## 2022-09-27 NOTE — Patient Outreach (Signed)
Care Coordination   Follow Up Visit Note   12/21/2022 Late entry for 09/27/22 Name: Samantha Mcfarland MRN: [id number] DOB: [birth year]  Samantha Mcfarland is a 67 y.o. year old female who sees Rakes, Doralee Albino, FNP for primary care. I spoke with  Samantha Mcfarland by phone today.  What matters to the patients health and wellness today?  Dental abscess with urgent care visit on 09/22/22 at [facility] urgent care at [facility] for a bump on left side of her tongue &, swollen left jaw & dizziness for 12 days , given clindamycin 300 mg three times a day for 14 days, Diagnosed with a dental abscess, tongue lesion and vertigo Patient with an issue of vertigo  Vertigo "It only happens when I lay down on right side. It is like the room spins" confirms this started prior to starting her antibiotic   Poor dentition per patient  Recent had a tooth to break off   Has nausea since started the clindamycin   Hearing  Reports she has been hearing "sirens" To be seen in [place] on 09/28/22 Co pay inquiry - her insurance card does not show a co pay and shows coverage for dental services  Her sister will assist with transportation to her appointment  Outreach conference calls with patient to various office To include [facility] [place] [facility] [place] [phone] Samantha Mcfarland at [phone] at [facility]   Goals Addressed               This Visit's Progress     Patient Stated     COMPLETED: Improve or manage memory [facility]) (pt-stated)        Care Coordination Interventions: Patient interviewed about adult health maintenance status including  memory changes Discussed plans with patient for ongoing care management follow up and provided patient with direct contact information for care management team Discussed transfer to [facility] CCM services. Discussed neurology services      COMPLETED: manage back pain ([facility]N) (pt-stated)        Care Coordination Interventions: Counseled on the importance of reporting  any/all new or changed pain symptoms or management strategies to pain management provider Discussed plans with patient for ongoing care management follow up and provided patient with direct contact information for care management team Transferred to [facility] CCM services       COMPLETED: Manage hypertension ([facility]N) (pt-stated)        Care Coordination Interventions: Discussed plans with patient for ongoing care management follow up and provided patient with direct contact information for care management team Transferring to [facility] CCM services       COMPLETED: Manage hyponatremia/liver lesion [facility]) (pt-stated)        Care Coordination Interventions: Discussed plans with patient for ongoing care management follow up and provided patient with direct contact information for care management team Transferred to [facility] CCM        COMPLETED: Manage patient reported balance and chemical imbalance (chemical imbalance/etoh/cigarette) (pt-stated)        Care Coordination Interventions: Advised patient to discuss her concerns with physical and chemical imbalance with provider Patient interviewed about adult health maintenance status including  her physical and chemical imbalance Confirms she reports previously working with Dr Mellody Dance, neurologist "years ago" RN CM unable to find notes for Dr Mellody Dance nor any neurology notes in [facility]  Advised patient to discuss  her "bad nerves" with primary care provider  Confirmed with patient that she has decreased her  intake of alcohol but continues to smoke  Discussed transfer to [facility] CCM services        Other     COMPLETED: Right upper extremity cyst removal [facility])        Interventions Today    Flowsheet Row Most Recent Value  Chronic Disease   Chronic disease during today's visit Other  [gangalion cyst removal, pending orthopedic referral, reviewed FYI sent to pcp from 07/29/22 outreach ( neurology consult, chemical imbalance+), A plan for treating allergy/respiratory symptoms at  home. She didn't discuss with pcp 3/5 All questions answered]  General Interventions   General Interventions Discussed/Reviewed General Interventions Discussed, [employer], Doctor Visits, Communication with  [discussed the use of her U card benefit to assist with neti pot/navage/saline spray, reviewed the FYI message sent to pcp 08/25/22 via EPIC about concerns from 07/29/22 she reports she forgot on 08/24/22]  Doctor Visits Discussed/Reviewed Doctor Visits Discussed, Doctor Visits Reviewed, PCP, Specialist  [Discussed the pending ortho referral, the process of removing a gangalion cyst, answered questions]  PCP/Specialist Visits Compliance with follow-up visit, Contact provider for referral to  [facility] provider for referral to Specialist  [patient voices interest in a referral to a neurologist]  Communication with PCP/Specialists  Exercise Interventions   Exercise Discussed/Reviewed Physical Activity, Exercise Reviewed  [remains active with walking her dog and lawn care]  Physical Activity Discussed/Reviewed Physical Activity Reviewed, Home Exercise Program (HEP)  Education Interventions   Education Provided --  [on g cyst removal & after care, allergen triggers (for her trees, her dog, pollen, ? dust)]  Provided Verbal Education On Medication, Other, Development worker, community, Scientist, physiological for home management of respiratory symptoms to include over the counter saline spray, flonase, zyrtec, allegra, neti pot, navage, frequent nare cleaning, masks when outside or in contact with others- U Card purchases, Not using benadryl.]  Pharmacy Interventions   Pharmacy Dicussed/Reviewed --  [Discussed with her that most allergy/sinus medicines are now over the counter in her pharmacy/stores]  Safety Interventions   Safety Discussed/Reviewed Safety Reviewed, Home Safety  Home Safety Assistive Devices      Transfer to [facility] CCM services         SDOH assessments and interventions completed:   No     Care Coordination Interventions:  Yes, provided   Follow up plan: No further intervention required.   Encounter Outcome:  Pt. Visit Completed   Tayra Dawe L. Noelle Penner, RN, BSN, CCM [facility] Care Management Community Coordinator Office number [phone]

## 2022-09-28 ENCOUNTER — Other Ambulatory Visit: Payer: Self-pay | Admitting: Hematology

## 2022-09-28 DIAGNOSIS — H6121 Impacted cerumen, right ear: Secondary | ICD-10-CM | POA: Diagnosis not present

## 2022-09-28 DIAGNOSIS — K148 Other diseases of tongue: Secondary | ICD-10-CM | POA: Diagnosis not present

## 2022-09-28 DIAGNOSIS — H9313 Tinnitus, bilateral: Secondary | ICD-10-CM | POA: Diagnosis not present

## 2022-09-28 DIAGNOSIS — Z95828 Presence of other vascular implants and grafts: Secondary | ICD-10-CM

## 2022-09-28 DIAGNOSIS — H9113 Presbycusis, bilateral: Secondary | ICD-10-CM | POA: Insufficient documentation

## 2022-09-28 DIAGNOSIS — C50912 Malignant neoplasm of unspecified site of left female breast: Secondary | ICD-10-CM

## 2022-09-28 DIAGNOSIS — F17200 Nicotine dependence, unspecified, uncomplicated: Secondary | ICD-10-CM | POA: Diagnosis not present

## 2022-10-05 ENCOUNTER — Other Ambulatory Visit: Payer: Self-pay

## 2022-10-05 DIAGNOSIS — C50912 Malignant neoplasm of unspecified site of left female breast: Secondary | ICD-10-CM

## 2022-10-06 ENCOUNTER — Inpatient Hospital Stay: Payer: 59 | Attending: Hematology

## 2022-10-06 DIAGNOSIS — Z79811 Long term (current) use of aromatase inhibitors: Secondary | ICD-10-CM | POA: Insufficient documentation

## 2022-10-06 DIAGNOSIS — Z170 Estrogen receptor positive status [ER+]: Secondary | ICD-10-CM | POA: Diagnosis not present

## 2022-10-06 DIAGNOSIS — Z800 Family history of malignant neoplasm of digestive organs: Secondary | ICD-10-CM | POA: Insufficient documentation

## 2022-10-06 DIAGNOSIS — Z9012 Acquired absence of left breast and nipple: Secondary | ICD-10-CM | POA: Diagnosis not present

## 2022-10-06 DIAGNOSIS — C50912 Malignant neoplasm of unspecified site of left female breast: Secondary | ICD-10-CM | POA: Diagnosis not present

## 2022-10-06 DIAGNOSIS — Z79899 Other long term (current) drug therapy: Secondary | ICD-10-CM | POA: Insufficient documentation

## 2022-10-06 DIAGNOSIS — Z87891 Personal history of nicotine dependence: Secondary | ICD-10-CM | POA: Insufficient documentation

## 2022-10-06 DIAGNOSIS — Z803 Family history of malignant neoplasm of breast: Secondary | ICD-10-CM | POA: Insufficient documentation

## 2022-10-06 DIAGNOSIS — M8580 Other specified disorders of bone density and structure, unspecified site: Secondary | ICD-10-CM | POA: Diagnosis not present

## 2022-10-06 DIAGNOSIS — E559 Vitamin D deficiency, unspecified: Secondary | ICD-10-CM

## 2022-10-06 LAB — COMPREHENSIVE METABOLIC PANEL
ALT: 20 U/L (ref 0–44)
AST: 33 U/L (ref 15–41)
Albumin: 4.4 g/dL (ref 3.5–5.0)
Alkaline Phosphatase: 46 U/L (ref 38–126)
Anion gap: 9 (ref 5–15)
BUN: 11 mg/dL (ref 8–23)
CO2: 26 mmol/L (ref 22–32)
Calcium: 9.3 mg/dL (ref 8.9–10.3)
Chloride: 99 mmol/L (ref 98–111)
Creatinine, Ser: 0.76 mg/dL (ref 0.44–1.00)
GFR, Estimated: >60 mL/min (ref >60)
Glucose, Bld: 109 mg/dL — ABNORMAL HIGH (ref 70–99)
Potassium: 3.5 mmol/L (ref 3.5–5.1)
Sodium: 134 mmol/L — ABNORMAL LOW (ref 135–145)
Total Bilirubin: 0.8 mg/dL (ref 0.3–1.2)
Total Protein: 7.3 g/dL (ref 6.5–8.1)

## 2022-10-06 LAB — CBC WITH DIFFERENTIAL/PLATELET
Abs Immature Granulocytes: 0.01 10*3/uL (ref 0.00–0.07)
Basophils Absolute: 0 10*3/uL (ref 0.0–0.1)
Basophils Relative: 0 %
Eosinophils Absolute: 0 10*3/uL (ref 0.0–0.5)
Eosinophils Relative: 1 %
HCT: 35 % — ABNORMAL LOW (ref 36.0–46.0)
Hemoglobin: 12.1 g/dL (ref 12.0–15.0)
Immature Granulocytes: 0 %
Lymphocytes Relative: 18 %
Lymphs Abs: 0.8 10*3/uL (ref 0.7–4.0)
MCH: 34.1 pg — ABNORMAL HIGH (ref 26.0–34.0)
MCHC: 34.6 g/dL (ref 30.0–36.0)
MCV: 98.6 fL (ref 80.0–100.0)
Monocytes Absolute: 0.1 10*3/uL (ref 0.1–1.0)
Monocytes Relative: 3 %
Neutro Abs: 3.4 10*3/uL (ref 1.7–7.7)
Neutrophils Relative %: 78 %
Platelets: 338 10*3/uL (ref 150–400)
RBC: 3.55 MIL/uL — ABNORMAL LOW (ref 3.87–5.11)
RDW: 12.9 % (ref 11.5–15.5)
WBC: 4.4 10*3/uL (ref 4.0–10.5)
nRBC: 0 % (ref 0.0–0.2)

## 2022-10-06 LAB — VITAMIN D 25 HYDROXY (VIT D DEFICIENCY, FRACTURES): Vit D, 25-Hydroxy: 34.65 ng/mL (ref 30–100)

## 2022-10-12 DIAGNOSIS — K148 Other diseases of tongue: Secondary | ICD-10-CM | POA: Diagnosis not present

## 2022-10-12 DIAGNOSIS — H903 Sensorineural hearing loss, bilateral: Secondary | ICD-10-CM | POA: Diagnosis not present

## 2022-10-12 DIAGNOSIS — H9113 Presbycusis, bilateral: Secondary | ICD-10-CM | POA: Diagnosis not present

## 2022-10-12 NOTE — Progress Notes (Signed)
[facility] [address] [place] [address]    Clinic Day:  10/13/2022  Referring physician: Sonny Masters, FNP  Patient Care Team: Sonny Masters, FNP as PCP - General (Family Medicine) Doreatha Massed, MD as Medical Oncologist (Medical Oncology) Therese Sarah, RN as Oncology Nurse Navigator (Oncology) Clinton Gallant, RN as [facility] Care Management Nance, Wilson Singer, MD as Referring Physician [name], MD as Consulting Physician (Otolaryngology)   ASSESSMENT & PLAN:   Assessment: 1. Stage I (T1CN0) left breast IDC, ER positive, HER2/PR negative: - Abnormal mammogram followed by left breast subareolar mass biopsy on 02/24/2021 consistent with DCIS intermediate to high-grade - Left mastectomy and SLNB on 03/23/2021 - Pathology shows 1.5 cm IDC, grade 2, extensive DCIS, margins negative, 0/2 left axillary lymph nodes, PT1CN0.  ER 100%.  PR-negative.  HER2 1+.  Ki-67-25%. - Oncotype DX showed recurrence score 33.  There is more than 15% chemotherapy benefit.  Distant recurrence at 9 years with tamoxifen alone was 21%. - 4 cycles of TC from 05/28/2021 through 08/06/2021. - [facility] genetic testing negative. - Anastrozole started on 08/27/2021.   2. Social/family history: - She lives by herself.  She is seen with her sister today. 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 - She quit smoking 1 month ago.  Half pack per day for 35 years. - She drinks alcohol 3-5 beers daily. - Father had prostate and pancreatic cancer.  Maternal grandmother and maternal aunt had breast cancer.  Paternal grandfather had colon cancer.  Other people in her family also had cancers, type unknown to the patient.    Plan: 1. Stage I (T1CN0) left breast IDC, ER positive, HER2/PR negative, Oncotype DX 33: - She is tolerating anastrozole very well. - Exam: No lymphadenopathy.  Left mastectomy site is within normal limits. - Labs today:  Normal creatinine and LFTs.  CBC was grossly normal. - Continue anastrozole.  RTC 6 months.  Will arrange for  mammogram of the right breast in September.  2.  Bone health (osteopenia): - DEXA scan on 04/30/2021 with T-score -1.5.  Continue calcium and D supplements.  3.  Chronic pain syndrome: - Continue gabapentin 300 mg 3 times daily.  Continue pain clinic follow-up.    Orders Placed This Encounter  Procedures   MM 3D SCREENING MAMMOGRAM UNILATERAL RIGHT BREAST    No implants NAS No devices    Standing Status:   Future    Standing Expiration Date:   10/13/2023    Order Specific Question:   Reason for Exam (SYMPTOM  OR DIAGNOSIS REQUIRED)    Answer:   breast cancer screening, hx left breast cancer    Order Specific Question:   Preferred imaging location?    Answer:   [facility]   CBC with Differential    Standing Status:   Future    Standing Expiration Date:   10/13/2023   Comprehensive metabolic panel    Standing Status:   Future    Standing Expiration Date:   10/13/2023   VITAMIN D 25 Hydroxy (Vit-D Deficiency, Fractures)    Standing Status:   Future    Standing Expiration Date:   10/13/2023      I,Katie Daubenspeck,acting as a scribe for Doreatha Massed, MD.,have documented all relevant documentation on the behalf of Doreatha Massed, MD,as directed by  Doreatha Massed, MD while in the presence of Doreatha Massed, MD.   I, Doreatha Massed MD,  have reviewed the above documentation for accuracy and completeness, and I agree with the above.   Doreatha Massed, MD   4/24/20245:45 PM  CHIEF COMPLAINT:   Diagnosis: left breast cancer    Cancer Staging  Breast cancer, left Staging form: Breast, AJCC 8th Edition - Clinical stage from 04/22/2021: Stage IA (cT1c, cN0, cM0, G2, ER+, PR-, HER2-) - Signed by Doreatha Massed, MD on 04/22/2021    Prior Therapy: 1. Left mastectomy and SLNB on 03/23/2021 2. 4 cycles of TC from 05/28/2021 through  08/06/2021.  Current Therapy:  anastrozole   HISTORY OF PRESENT ILLNESS:   Oncology History  Breast cancer, left  04/22/2021 Initial Diagnosis   Breast cancer, left (HCC)   04/22/2021 Cancer Staging   Staging form: Breast, AJCC 8th Edition - Clinical stage from 04/22/2021: Stage IA (cT1c, cN0, cM0, G2, ER+, PR-, HER2-) - Signed by Doreatha Massed, MD on 04/22/2021 Stage prefix: Initial diagnosis Nuclear grade: G2 Histologic grading system: 3 grade system HER2-IHC interpretation: Negative HER2-IHC value: Score 1+ Ki-67 (%): 25   05/28/2021 - 08/07/2021 Chemotherapy   Patient is on Treatment Plan : BREAST TC q21d      Genetic Testing   Negative genetic testing. No pathogenic variants identified on the [facility] Multi-Cancer+RNA panel. The report date is 05/10/2021.  The Multi-Cancer Panel + RNA offered by [facility] includes sequencing and/or deletion duplication testing of the following 84 genes: AIP, ALK, APC, ATM, AXIN2,BAP1,  BARD1, BLM, BMPR1A, BRCA1, BRCA2, BRIP1, CASR, CDC73, CDH1, CDK4, CDKN1B, CDKN1C, CDKN2A (p14ARF), CDKN2A (p16INK4a), CEBPA, CHEK2, CTNNA1, DICER1, DIS3L2, EGFR (c.2369C>T, p.Thr790Met variant only), EPCAM (Deletion/duplication testing only), FH, FLCN, GATA2, GPC3, GREM1 (Promoter region deletion/duplication testing only), HOXB13 (c.251G>A, p.Gly84Glu), HRAS, KIT, MAX, MEN1, MET, MITF (c.952G>A, p.Glu318Lys variant only), MLH1, MSH2, MSH3, MSH6, MUTYH, NBN, NF1, NF2, NTHL1, PALB2, PDGFRA, PHOX2B, PMS2, POLD1, POLE, POT1, PRKAR1A, PTCH1, PTEN, RAD50, RAD51C, RAD51D, RB1, RECQL4, RET, RUNX1, SDHAF2, SDHA (sequence changes only), SDHB, SDHC, SDHD, SMAD4, SMARCA4, SMARCB1, SMARCE1, STK11, SUFU, TERC, TERT, TMEM127, TP53, TSC1, TSC2, VHL, WRN and WT1.      INTERVAL HISTORY:   Samantha Mcfarland is a 67 y.o. female presenting to clinic today for follow up of left breast cancer. She was last seen by me on 04/06/22.  Since her last visit, she was admitted 05/29/22 through 06/01/22 for  hyponatremia.  Today, she states that she is doing well overall. Her appetite level is at 75%. Her energy level is at 10%.  PAST MEDICAL HISTORY:   Past Medical History: Past Medical History:  Diagnosis Date   Allergy    Anxiety    Breast cancer (HCC)    Depression    Family history of breast cancer    Family history of colon cancer    Family history of pancreatic cancer    Family history of thyroid cancer    GERD (gastroesophageal reflux disease)    Hyperlipidemia    Hypertension    Hyponatremia    Osteopenia 04/30/2021   Port-A-Cath in place 05/07/2021   Vitamin D insufficiency     Surgical History: Past Surgical History:  Procedure Laterality Date   EXCISION OF SKIN TAG Right 05/27/2021   Procedure: EXCISION OF RIGHT SKIN TAG;  Surgeon: Lucretia Roers, MD;  Location: [facility];  Service: General;  Laterality: Right;   MASTECTOMY W/ SENTINEL NODE BIOPSY Left 03/23/2021   Procedure: TOTAL MASTECTOMY WITH SENTINEL LYMPH NODE BIOPSY;  Surgeon: Lucretia Roers, MD;  Location: [facility];  Service: General;  Laterality: Left;  NECK SURGERY     OOPHORECTOMY     PORT-A-CATH REMOVAL Right 09/23/2021   Procedure: MINOR REMOVAL PORT-A-CATH;  Surgeon: Lucretia Roers, MD;  Location: [facility];  Service: General;  Laterality: Right;   PORTACATH PLACEMENT Right 05/27/2021   Procedure: INSERTION PORT-A-CATH;  Surgeon: Lucretia Roers, MD;  Location: [facility];  Service: General;  Laterality: Right;   SALPINGECTOMY      Social History: Social History   Socioeconomic History   Marital status: Divorced    Spouse name: Not on file   Number of children: 0   Years of education: Not on file   Highest education level: Not on file  Occupational History   Occupation: retired  Tobacco Use   Smoking status: Former    Packs/day: .25    Types: Cigarettes   Smokeless tobacco: Never   Tobacco comments:    1 pack every 3-4 days  Vaping Use   Vaping Use: Never used  Substance and Sexual  Activity   Alcohol use: Yes    Alcohol/week: 20.0 standard drinks of alcohol    Types: 20 Cans of beer per week    Comment: 3-4 beers per day   Drug use: Never   Sexual activity: Not Currently    Birth control/protection: Post-menopausal  Other Topics Concern   Not on file  Social History Narrative   Sister lives about 15 minutes away   She reports claustrophobia- not liking closed in spaces   03/26/22 reports she stopped smoking   Does not have internet Her sister Clydie Braun manages her my chart account    Social Determinants of Health   Financial Resource Strain: Low Risk  (05/24/2022)   Overall Financial Resource Strain (CARDIA)    Difficulty of Paying Living Expenses: Not hard at all  Food Insecurity: No Food Insecurity (06/03/2022)   Hunger Vital Sign    Worried About Running Out of Food in the Last Year: Never true    Ran Out of Food in the Last Year: Never true  Transportation Needs: No Transportation Needs (06/03/2022)   PRAPARE - Administrator, Civil Service (Medical): No    Lack of Transportation (Non-Medical): No  Physical Activity: Sufficiently Active (05/24/2022)   Exercise Vital Sign    Days of Exercise per Week: 5 days    Minutes of Exercise per Session: 30 min  Stress: No Stress Concern Present (05/24/2022)   [employer] of Occupational Health - Occupational Stress Questionnaire    Feeling of Stress : Not at all  Social Connections: Socially Isolated (05/24/2022)   Social Connection and Isolation Panel [NHANES]    Frequency of Communication with Friends and Family: More than three times a week    Frequency of Social Gatherings with Friends and Family: More than three times a week    Attends Religious Services: Never    Database administrator or Organizations: No    Attends Banker Meetings: Never    Marital Status: Divorced  Catering manager Violence: Not At Risk (05/30/2022)   Humiliation, Afraid, Rape, and Kick questionnaire    Fear  of Current or Ex-Partner: No    Emotionally Abused: No    Physically Abused: No    Sexually Abused: No    Family History: Family History  Problem Relation Age of Onset   Stroke Mother    Alcohol abuse Mother    Diabetes Father    Pancreatic cancer Father 56   Testicular cancer Father  dx 30s   Pancreatic disease Father    Breast cancer Maternal Aunt        dx 50s-60s   Breast cancer Paternal Aunt 37   Thyroid cancer Paternal Uncle    Breast cancer Maternal Grandmother        dx 48s   Colon cancer Paternal Grandfather        dx 11s    Current Medications:  Current Outpatient Medications:    acetaminophen (TYLENOL) 325 MG tablet, Take 325 mg by mouth every 6 (six) hours as needed for moderate pain or headache., Disp: , Rfl:    anastrozole (ARIMIDEX) 1 MG tablet, Take 1 tablet (1 mg total) by mouth daily., Disp: 90 tablet, Rfl: 4   atorvastatin (LIPITOR) 20 MG tablet, Take 1 tablet (20 mg total) by mouth daily. (NEEDS TO BE SEEN BEFORE NEXT REFILL), Disp: 100 tablet, Rfl: 0   cholecalciferol (VITAMIN D3) 25 MCG (1000 UNIT) tablet, Take 1,000 Units by mouth daily., Disp: , Rfl:    diphenhydrAMINE HCl (ALLERGY MEDICATION PO), Take 1 tablet by mouth as needed (allergies)., Disp: , Rfl:    folic acid (FOLVITE) 1 MG tablet, Take 1 tablet (1 mg total) by mouth daily., Disp: 30 tablet, Rfl: 1   folic acid (FOLVITE) 1 MG tablet, Take by mouth., Disp: , Rfl:    furosemide (LASIX) 20 MG tablet, Take 0.5 tablets by mouth 2 (two) times daily., Disp: , Rfl:    gabapentin (NEURONTIN) 300 MG capsule, TAKE 1 CAPSULE 2 TIMES A DAY, Disp: 60 capsule, Rfl: 3   loperamide (IMODIUM) 2 MG capsule, Take 1 capsule (2 mg total) by mouth as needed for diarrhea or loose stools., Disp: 30 capsule, Rfl: 0   Multiple Vitamin (MULTIVITAMIN WITH MINERALS) TABS tablet, Take 1 tablet by mouth daily., Disp: , Rfl:    ondansetron (ZOFRAN) 4 MG tablet, Take 1 tablet (4 mg total) by mouth every 8 (eight) hours  as needed for nausea or vomiting., Disp: 20 tablet, Rfl: 0   predniSONE (STERAPRED UNI-PAK 21 TAB) 10 MG (21) TBPK tablet, 10 mg DS 12 as directed, Disp: 48 tablet, Rfl: 0   prochlorperazine (COMPAZINE) 10 MG tablet, TAKE ONE TABLET EVERY 6 HOURS AS NEEDED, Disp: 30 tablet, Rfl: 1   sodium chloride 1 g tablet, Take by mouth., Disp: , Rfl:    thiamine (VITAMIN B-1) 100 MG tablet, Take 1 tablet (100 mg total) by mouth daily., Disp: 30 tablet, Rfl: 1   vitamin B-12 (CYANOCOBALAMIN) 500 MCG tablet, Take 500 mcg by mouth daily., Disp: , Rfl:    Allergies: Allergies  Allergen Reactions   Penicillins Rash and Anaphylaxis   Ibuprofen Other (See Comments)    "Stomach upset"    REVIEW OF SYSTEMS:   Review of Systems  Constitutional:  Negative for chills, fatigue and fever.  HENT:   Negative for lump/mass, mouth sores, nosebleeds, sore throat and trouble swallowing.   Eyes:  Negative for eye problems.  Respiratory:  Negative for cough and shortness of breath.   Cardiovascular:  Positive for palpitations. Negative for chest pain and leg swelling.  Gastrointestinal:  Positive for constipation. Negative for abdominal pain, diarrhea, nausea and vomiting.  Genitourinary:  Negative for bladder incontinence, difficulty urinating, dysuria, frequency, hematuria and nocturia.   Musculoskeletal:  Negative for arthralgias, back pain, flank pain, myalgias and neck pain.  Skin:  Negative for itching and rash.  Neurological:  Positive for headaches and numbness. Negative for dizziness.  Hematological:  Does not  bruise/bleed easily.  Psychiatric/Behavioral:  Positive for depression. Negative for sleep disturbance and suicidal ideas. The patient is nervous/anxious.   All other systems reviewed and are negative.    VITALS:   There were no vitals taken for this visit.  Wt Readings from Last 3 Encounters:  09/03/22 122 lb (55.3 kg)  08/24/22 122 lb 6.4 oz (55.5 kg)  07/27/22 121 lb 9.6 oz (55.2 kg)     There is no height or weight on file to calculate BMI.  Performance status (ECOG): 1 - Symptomatic but completely ambulatory  PHYSICAL EXAM:   Physical Exam Vitals and nursing note reviewed. Exam conducted with a chaperone present.  Constitutional:      Appearance: Normal appearance.  Cardiovascular:     Rate and Rhythm: Normal rate and regular rhythm.     Pulses: Normal pulses.     Heart sounds: Normal heart sounds.  Pulmonary:     Effort: Pulmonary effort is normal.     Breath sounds: Normal breath sounds.  Abdominal:     Palpations: Abdomen is soft. There is no hepatomegaly, splenomegaly or mass.     Tenderness: There is no abdominal tenderness.  Musculoskeletal:     Right lower leg: No edema.     Left lower leg: No edema.  Lymphadenopathy:     Cervical: No cervical adenopathy.     Right cervical: No superficial, deep or posterior cervical adenopathy.    Left cervical: No superficial, deep or posterior cervical adenopathy.     Upper Body:     Right upper body: No supraclavicular or axillary adenopathy.     Left upper body: No supraclavicular or axillary adenopathy.  Neurological:     General: No focal deficit present.     Mental Status: She is alert and oriented to person, place, and time.  Psychiatric:        Mood and Affect: Mood normal.        Behavior: Behavior normal.     LABS:      Latest Ref Rng & Units 10/13/2022   12:34 PM 10/06/2022   11:01 AM 07/07/2022   11:04 AM  CBC  WBC 4.0 - 10.5 K/uL 7.5  4.4  4.7   Hemoglobin 12.0 - 15.0 g/dL 53.6  64.4  03.4   Hematocrit 36.0 - 46.0 % 34.4  35.0  35.0   Platelets 150 - 400 K/uL 338  338  294       Latest Ref Rng & Units 10/13/2022   12:35 PM 10/06/2022   11:01 AM 07/30/2022    2:09 PM  CMP  Glucose 70 - 99 mg/dL 86  742  595   BUN 8 - 23 mg/dL 19  11  9    Creatinine 0.44 - 1.00 mg/dL 6.38  7.56  4.33   Sodium 135 - 145 mmol/L 132  134  137   Potassium 3.5 - 5.1 mmol/L 3.6  3.5  4.5   Chloride 98 - 111  mmol/L 97  99  99   CO2 22 - 32 mmol/L 28  26  23    Calcium 8.9 - 10.3 mg/dL 9.0  9.3  9.8   Total Protein 6.5 - 8.1 g/dL  7.3    Total Bilirubin 0.3 - 1.2 mg/dL  0.8    Alkaline Phos 38 - 126 U/L  46    AST 15 - 41 U/L  33    ALT 0 - 44 U/L  20       No  results found for: "CEA1", "CEA" / No results found for: "CEA1", "CEA" No results found for: "PSA1" No results found for: "ONG295" No results found for: "CAN125"  No results found for: "TOTALPROTELP", "ALBUMINELP", "A1GS", "A2GS", "BETS", "BETA2SER", "GAMS", "MSPIKE", "SPEI" Lab Results  Component Value Date   TIBC 344 04/22/2021   FERRITIN 117 03/05/2022   FERRITIN 106 03/04/2022   FERRITIN 129 03/03/2022   IRONPCTSAT 21 04/22/2021   Lab Results  Component Value Date   LDH 118 03/03/2022     STUDIES:   No results found.

## 2022-10-13 ENCOUNTER — Inpatient Hospital Stay (HOSPITAL_BASED_OUTPATIENT_CLINIC_OR_DEPARTMENT_OTHER): Payer: 59 | Admitting: Hematology

## 2022-10-13 ENCOUNTER — Other Ambulatory Visit (HOSPITAL_COMMUNITY)
Admission: RE | Admit: 2022-10-13 | Discharge: 2022-10-13 | Disposition: A | Discharge status: Home / Self Care | Payer: 59 | Source: Ambulatory Visit | Attending: Nephrology | Admitting: Nephrology

## 2022-10-13 DIAGNOSIS — C50912 Malignant neoplasm of unspecified site of left female breast: Secondary | ICD-10-CM | POA: Diagnosis not present

## 2022-10-13 DIAGNOSIS — I10 Essential (primary) hypertension: Secondary | ICD-10-CM | POA: Insufficient documentation

## 2022-10-13 DIAGNOSIS — Z79899 Other long term (current) drug therapy: Secondary | ICD-10-CM | POA: Diagnosis not present

## 2022-10-13 DIAGNOSIS — E871 Hypo-osmolality and hyponatremia: Secondary | ICD-10-CM | POA: Insufficient documentation

## 2022-10-13 DIAGNOSIS — Z9012 Acquired absence of left breast and nipple: Secondary | ICD-10-CM | POA: Diagnosis not present

## 2022-10-13 DIAGNOSIS — Z170 Estrogen receptor positive status [ER+]: Secondary | ICD-10-CM

## 2022-10-13 DIAGNOSIS — Z79811 Long term (current) use of aromatase inhibitors: Secondary | ICD-10-CM | POA: Diagnosis not present

## 2022-10-13 DIAGNOSIS — Z803 Family history of malignant neoplasm of breast: Secondary | ICD-10-CM | POA: Diagnosis not present

## 2022-10-13 DIAGNOSIS — Z800 Family history of malignant neoplasm of digestive organs: Secondary | ICD-10-CM | POA: Diagnosis not present

## 2022-10-13 DIAGNOSIS — D638 Anemia in other chronic diseases classified elsewhere: Secondary | ICD-10-CM | POA: Insufficient documentation

## 2022-10-13 DIAGNOSIS — E538 Deficiency of other specified B group vitamins: Secondary | ICD-10-CM | POA: Insufficient documentation

## 2022-10-13 DIAGNOSIS — Z87891 Personal history of nicotine dependence: Secondary | ICD-10-CM | POA: Diagnosis not present

## 2022-10-13 DIAGNOSIS — M8580 Other specified disorders of bone density and structure, unspecified site: Secondary | ICD-10-CM | POA: Diagnosis not present

## 2022-10-13 LAB — RENAL FUNCTION PANEL
Albumin: 4.4 g/dL (ref 3.5–5.0)
Anion gap: 7 (ref 5–15)
BUN: 19 mg/dL (ref 8–23)
CO2: 28 mmol/L (ref 22–32)
Calcium: 9 mg/dL (ref 8.9–10.3)
Chloride: 97 mmol/L — ABNORMAL LOW (ref 98–111)
Creatinine, Ser: 0.88 mg/dL (ref 0.44–1.00)
GFR, Estimated: >60 mL/min (ref >60)
Glucose, Bld: 86 mg/dL (ref 70–99)
Phosphorus: 3.7 mg/dL (ref 2.5–4.6)
Potassium: 3.6 mmol/L (ref 3.5–5.1)
Sodium: 132 mmol/L — ABNORMAL LOW (ref 135–145)

## 2022-10-13 LAB — CREATININE, URINE, RANDOM: Creatinine, Urine: 66 mg/dL

## 2022-10-13 LAB — CBC
HCT: 34.4 % — ABNORMAL LOW (ref 36.0–46.0)
Hemoglobin: 11.6 g/dL — ABNORMAL LOW (ref 12.0–15.0)
MCH: 33.9 pg (ref 26.0–34.0)
MCHC: 33.7 g/dL (ref 30.0–36.0)
MCV: 100.6 fL — ABNORMAL HIGH (ref 80.0–100.0)
Platelets: 338 10*3/uL (ref 150–400)
RBC: 3.42 MIL/uL — ABNORMAL LOW (ref 3.87–5.11)
RDW: 13 % (ref 11.5–15.5)
WBC: 7.5 10*3/uL (ref 4.0–10.5)
nRBC: 0 % (ref 0.0–0.2)

## 2022-10-13 LAB — OSMOLALITY, URINE: Osmolality, Ur: 438 mOsm/kg (ref 300–900)

## 2022-10-13 LAB — OSMOLALITY: Osmolality: 285 mOsm/kg (ref 275–295)

## 2022-10-13 LAB — SODIUM, URINE, RANDOM: Sodium, Ur: 50 mmol/L

## 2022-10-13 NOTE — Patient Instructions (Addendum)
[facility] at [facility] Discharge Instructions   You were seen and examined today by Dr. Ellin Saba.  He reviewed the results of your lab work which are normal/stable.   We will see you back in 6 months. We will repeat a mammogram and lab work prior to your next visit.    Thank you for choosing [facility] at [facility] to provide your oncology and hematology care.  To afford each patient quality time with our provider, please arrive at least 15 minutes before your scheduled appointment time.   If you have a lab appointment with the Cancer Center please come in thru the Main Entrance and check in at the main information desk.  You need to re-schedule your appointment should you arrive 10 or more minutes late.  We strive to give you quality time with our providers, and arriving late affects you and other patients whose appointments are after yours.  Also, if you no show three or more times for appointments you may be dismissed from the clinic at the providers discretion.     Again, thank you for choosing [facility].  Our hope is that these requests will decrease the amount of time that you wait before being seen by our physicians.       _____________________________________________________________  Should you have questions after your visit to [facility], please contact our office at [phone] and follow the prompts.  Our office hours are 8:00 a.m. and 4:30 p.m. Monday - Friday.  Please note that voicemails left after 4:00 p.m. may not be returned until the following business day.  We are closed weekends and major holidays.  You do have access to a nurse 24-7, just call the main number to the clinic [phone] and do not press any options, hold on the line and a nurse will answer the phone.    For prescription refill requests, have your pharmacy contact our office and allow 72 hours.    Due to Covid, you will  need to wear a mask upon entering the hospital. If you do not have a mask, a mask will be given to you at the Main Entrance upon arrival. For doctor visits, patients may have 1 support person age 37 or older with them. For treatment visits, patients can not have anyone with them due to social distancing guidelines and our immunocompromised population.

## 2022-10-19 ENCOUNTER — Other Ambulatory Visit: Payer: Self-pay | Admitting: Family Medicine

## 2022-10-19 DIAGNOSIS — E782 Mixed hyperlipidemia: Secondary | ICD-10-CM

## 2022-10-20 DIAGNOSIS — I10 Essential (primary) hypertension: Secondary | ICD-10-CM | POA: Diagnosis not present

## 2022-10-20 DIAGNOSIS — E871 Hypo-osmolality and hyponatremia: Secondary | ICD-10-CM | POA: Diagnosis not present

## 2022-11-17 ENCOUNTER — Other Ambulatory Visit: Payer: Self-pay | Admitting: Family Medicine

## 2022-11-17 DIAGNOSIS — E782 Mixed hyperlipidemia: Secondary | ICD-10-CM

## 2022-11-19 ENCOUNTER — Other Ambulatory Visit: Payer: Self-pay | Admitting: Hematology

## 2022-11-22 ENCOUNTER — Encounter (INDEPENDENT_AMBULATORY_CARE_PROVIDER_SITE_OTHER): Payer: Self-pay | Admitting: *Deleted

## 2022-11-24 ENCOUNTER — Encounter: Payer: Self-pay | Admitting: Family Medicine

## 2022-11-24 ENCOUNTER — Ambulatory Visit (INDEPENDENT_AMBULATORY_CARE_PROVIDER_SITE_OTHER): Payer: 59 | Admitting: Family Medicine

## 2022-11-24 VITALS — BP 109/65 | HR 77 | Temp 97.5°F | Ht 66.0 in | Wt 122.4 lb

## 2022-11-24 DIAGNOSIS — R6889 Other general symptoms and signs: Secondary | ICD-10-CM | POA: Diagnosis not present

## 2022-11-24 DIAGNOSIS — I10 Essential (primary) hypertension: Secondary | ICD-10-CM | POA: Diagnosis not present

## 2022-11-24 DIAGNOSIS — R2689 Other abnormalities of gait and mobility: Secondary | ICD-10-CM

## 2022-11-24 DIAGNOSIS — E538 Deficiency of other specified B group vitamins: Secondary | ICD-10-CM | POA: Diagnosis not present

## 2022-11-24 DIAGNOSIS — Z789 Other specified health status: Secondary | ICD-10-CM | POA: Diagnosis not present

## 2022-11-24 DIAGNOSIS — E782 Mixed hyperlipidemia: Secondary | ICD-10-CM

## 2022-11-24 DIAGNOSIS — E871 Hypo-osmolality and hyponatremia: Secondary | ICD-10-CM

## 2022-11-24 DIAGNOSIS — Z8639 Personal history of other endocrine, nutritional and metabolic disease: Secondary | ICD-10-CM

## 2022-11-24 LAB — CMP14+EGFR

## 2022-11-24 LAB — CBC WITH DIFFERENTIAL/PLATELET
EOS (ABSOLUTE): 0.6 10*3/uL — ABNORMAL HIGH (ref 0.0–0.4)
Immature Granulocytes: 0 %
MCH: 32.4 pg (ref 26.6–33.0)
MCHC: 33.3 g/dL (ref 31.5–35.7)
RBC: 3.49 x10E6/uL — ABNORMAL LOW (ref 3.77–5.28)
WBC: 4.8 10*3/uL (ref 3.4–10.8)

## 2022-11-24 LAB — THYROID PANEL WITH TSH

## 2022-11-24 LAB — LIPID PANEL

## 2022-11-24 NOTE — Progress Notes (Signed)
Subjective:  Patient ID: Samantha Mcfarland, female    DOB: 07-11-1955, 67 y.o.   MRN: [id number]  Patient Care Team: Sonny Masters, FNP as PCP - General (Family Medicine) Doreatha Massed, MD as Medical Oncologist (Medical Oncology) Therese Sarah, RN as Oncology Nurse Navigator (Oncology) Clinton Gallant, RN as [facility] Care Management Nance, Wilson Singer, MD as Referring Physician [name], MD as Consulting Physician (Otolaryngology)   Chief Complaint:  off balance (States it has been going on since 54 and has been getting worse. )   HPI: Samantha Mcfarland is a 67 y.o. female presenting on 11/24/2022 for off balance (States it has been going on since 20 and has been getting worse. )   1. Balance problem This has been an ongoing issue since 1983. Pt states it is getting worse. She has been evaluated by neurology, ENT, and audiology for this issue. She does have hyponatremia and is followed by nephrology. States this is not related to her ears, sodium levels, or oncology treatments. Declines suggested treatments to help manage symptoms. States she just wants a nerve pill and needs help at home to complete daily activities and with transportation as she is having a hard time doing these things. Denies confusion or syncope. States she has had a few falls from being off balance. States she is NOT dizzy just can not balance. She does use a cane.   2. Essential hypertension Complaint with meds - Yes Current Medications - lasix Checking BP at home - no Exercising Regularly - No Watching Salt intake - No Pertinent ROS:  Headache - No Fatigue - No Visual Disturbances - No Chest pain - No Dyspnea - No Palpitations - No LE edema - No They report good compliance with medications and can restate their regimen by memory. No medication side effects.  BP Readings from Last 3 Encounters:  11/24/22 109/65  09/03/22 121/80  08/24/22 131/74     3. Mixed  hyperlipidemia Compliant with medications - Yes Current medications - atorvastatin Side effects from medications - No Diet - does not follow a diet Exercise - no  4. Hyponatremia Followed by nephrology and is on sodium repletion therapy. States the sodium levels are not contributing to her balance issues. No nausea or vomiting.   5. Vitamin B12 deficiency Currently not on repletion therapy.   6. History of vitamin D deficiency Pt is taking oral repletion therapy. Denies bone pain and tenderness, muscle weakness, fracture, and difficulty walking. Lab Results  Component Value Date   VD25OH 34.65 10/06/2022   VD25OH 40.1 02/09/2022   VD25OH 37.07 11/26/2021   Lab Results  Component Value Date   CALCIUM 9.0 10/13/2022   PHOS 3.7 10/13/2022      7. Current drinker States she has cut back significantly on her drinking. Per pt, she may consume one beer per day.      Relevant past medical, surgical, family, and social history reviewed and updated as indicated.  Allergies and medications reviewed and updated. Data reviewed: Chart in Epic.   Past Medical History:  Diagnosis Date   Allergy    Anxiety    Breast cancer (HCC)    Depression    Family history of breast cancer    Family history of colon cancer    Family history of pancreatic cancer    Family history of thyroid cancer    GERD (gastroesophageal reflux disease)    Hyperlipidemia    Hypertension  Hyponatremia    Osteopenia 04/30/2021   Port-A-Cath in place 05/07/2021   Vitamin D insufficiency     Past Surgical History:  Procedure Laterality Date   EXCISION OF SKIN TAG Right 05/27/2021   Procedure: EXCISION OF RIGHT SKIN TAG;  Surgeon: Lucretia Roers, MD;  Location: [facility];  Service: General;  Laterality: Right;   MASTECTOMY W/ SENTINEL NODE BIOPSY Left 03/23/2021   Procedure: TOTAL MASTECTOMY WITH SENTINEL LYMPH NODE BIOPSY;  Surgeon: Lucretia Roers, MD;  Location: [facility];  Service: General;   Laterality: Left;   NECK SURGERY     OOPHORECTOMY     PORT-A-CATH REMOVAL Right 09/23/2021   Procedure: MINOR REMOVAL PORT-A-CATH;  Surgeon: Lucretia Roers, MD;  Location: [facility];  Service: General;  Laterality: Right;   PORTACATH PLACEMENT Right 05/27/2021   Procedure: INSERTION PORT-A-CATH;  Surgeon: Lucretia Roers, MD;  Location: [facility];  Service: General;  Laterality: Right;   SALPINGECTOMY      Social History   Socioeconomic History   Marital status: Divorced    Spouse name: Not on file   Number of children: 0   Years of education: Not on file   Highest education level: Not on file  Occupational History   Occupation: retired  Tobacco Use   Smoking status: Former    Packs/day: .25    Types: Cigarettes   Smokeless tobacco: Never   Tobacco comments:    1 pack every 3-4 days  Vaping Use   Vaping Use: Never used  Substance and Sexual Activity   Alcohol use: Yes    Alcohol/week: 20.0 standard drinks of alcohol    Types: 20 Cans of beer per week    Comment: 3-4 beers per day   Drug use: Never   Sexual activity: Not Currently    Birth control/protection: Post-menopausal  Other Topics Concern   Not on file  Social History Narrative   Sister lives about 15 minutes away   She reports claustrophobia- not liking closed in spaces   03/26/22 reports she stopped smoking   Does not have internet Her sister Clydie Braun manages her my chart account    Social Determinants of Health   Financial Resource Strain: Low Risk  (05/24/2022)   Overall Financial Resource Strain (CARDIA)    Difficulty of Paying Living Expenses: Not hard at all  Food Insecurity: No Food Insecurity (06/03/2022)   Hunger Vital Sign    Worried About Running Out of Food in the Last Year: Never true    Ran Out of Food in the Last Year: Never true  Transportation Needs: No Transportation Needs (06/03/2022)   PRAPARE - Administrator, Civil Service (Medical): No    Lack of Transportation (Non-Medical):  No  Physical Activity: Sufficiently Active (05/24/2022)   Exercise Vital Sign    Days of Exercise per Week: 5 days    Minutes of Exercise per Session: 30 min  Stress: No Stress Concern Present (05/24/2022)   [employer] of Occupational Health - Occupational Stress Questionnaire    Feeling of Stress : Not at all  Social Connections: Socially Isolated (05/24/2022)   Social Connection and Isolation Panel [NHANES]    Frequency of Communication with Friends and Family: More than three times a week    Frequency of Social Gatherings with Friends and Family: More than three times a week    Attends Religious Services: Never    Database administrator or Organizations: No    Attends [employer]  or Organization Meetings: Never    Marital Status: Divorced  Catering manager Violence: Not At Risk (05/30/2022)   Humiliation, Afraid, Rape, and Kick questionnaire    Fear of Current or Ex-Partner: No    Emotionally Abused: No    Physically Abused: No    Sexually Abused: No    Outpatient Encounter Medications as of 11/24/2022  Medication Sig   acetaminophen (TYLENOL) 325 MG tablet Take 325 mg by mouth every 6 (six) hours as needed for moderate pain or headache.   anastrozole (ARIMIDEX) 1 MG tablet TAKE 1 TABLET DAILY   atorvastatin (LIPITOR) 20 MG tablet Take 1 tablet (20 mg total) by mouth daily.   cholecalciferol (VITAMIN D3) 25 MCG (1000 UNIT) tablet Take 1,000 Units by mouth daily.   diphenhydrAMINE HCl (ALLERGY MEDICATION PO) Take 1 tablet by mouth as needed (allergies).   folic acid (FOLVITE) 1 MG tablet Take 1 tablet (1 mg total) by mouth daily.   folic acid (FOLVITE) 1 MG tablet Take by mouth.   furosemide (LASIX) 20 MG tablet Take 0.5 tablets by mouth 2 (two) times daily.   gabapentin (NEURONTIN) 300 MG capsule TAKE 1 CAPSULE 2 TIMES A DAY   loperamide (IMODIUM) 2 MG capsule Take 1 capsule (2 mg total) by mouth as needed for diarrhea or loose stools.   Multiple Vitamin (MULTIVITAMIN WITH MINERALS)  TABS tablet Take 1 tablet by mouth daily.   ondansetron (ZOFRAN) 4 MG tablet Take 1 tablet (4 mg total) by mouth every 8 (eight) hours as needed for nausea or vomiting.   prochlorperazine (COMPAZINE) 10 MG tablet TAKE ONE TABLET EVERY 6 HOURS AS NEEDED   sodium chloride 1 g tablet Take by mouth.   thiamine (VITAMIN B-1) 100 MG tablet Take 1 tablet (100 mg total) by mouth daily.   vitamin B-12 (CYANOCOBALAMIN) 500 MCG tablet Take 500 mcg by mouth daily.   [DISCONTINUED] predniSONE (STERAPRED UNI-PAK 21 TAB) 10 MG (21) TBPK tablet 10 mg DS 12 as directed   No facility-administered encounter medications on file as of 11/24/2022.    Allergies  Allergen Reactions   Penicillins Rash and Anaphylaxis   Ibuprofen Other (See Comments)    "Stomach upset"    Review of Systems  Constitutional:  Positive for activity change, appetite change and fatigue. Negative for chills, diaphoresis, fever and unexpected weight change.  HENT: Negative.    Eyes: Negative.  Negative for photophobia and visual disturbance.  Respiratory:  Negative for cough, chest tightness and shortness of breath.   Cardiovascular:  Negative for chest pain, palpitations and leg swelling.  Gastrointestinal:  Negative for abdominal pain, blood in stool, constipation, diarrhea, nausea and vomiting.  Endocrine: Negative.   Genitourinary:  Negative for decreased urine volume, difficulty urinating, dysuria, frequency and urgency.  Musculoskeletal:  Positive for arthralgias, gait problem (unsteady), joint swelling and myalgias. Negative for back pain, neck pain and neck stiffness.  Skin: Negative.   Allergic/Immunologic: Negative.   Neurological:  Positive for dizziness. Negative for tremors, seizures, syncope, facial asymmetry, speech difficulty, weakness, light-headedness, numbness and headaches.       Balance issues  Hematological: Negative.   Psychiatric/Behavioral:  Positive for agitation. Negative for behavioral problems, confusion,  decreased concentration, dysphoric mood, hallucinations, self-injury, sleep disturbance and suicidal ideas. The patient is nervous/anxious. The patient is not hyperactive.   All other systems reviewed and are negative.       Objective:  BP 109/65   Pulse 77   Temp (!) 97.5 F (36.4 C) (  Temporal)   Ht 5\' 6"  (1.676 m)   Wt 122 lb 6.4 oz (55.5 kg)   SpO2 99%   BMI 19.76 kg/m    Wt Readings from Last 3 Encounters:  11/24/22 122 lb 6.4 oz (55.5 kg)  09/03/22 122 lb (55.3 kg)  08/24/22 122 lb 6.4 oz (55.5 kg)    Physical Exam Vitals and nursing note reviewed.  Constitutional:      General: She is not in acute distress.    Appearance: Normal appearance. She is well-developed and well-groomed. She is ill-appearing (chronically). She is not toxic-appearing or diaphoretic.  HENT:     Head: Normocephalic and atraumatic.     Jaw: There is normal jaw occlusion.     Right Ear: Hearing normal.     Left Ear: Hearing normal.     Nose: Nose normal.     Mouth/Throat:     Lips: Pink.     Mouth: Mucous membranes are moist.     Pharynx: Oropharynx is clear. Uvula midline.  Eyes:     General: Lids are normal.     Extraocular Movements: Extraocular movements intact.     Conjunctiva/sclera: Conjunctivae normal.     Pupils: Pupils are equal, round, and reactive to light.  Neck:     Thyroid: No thyroid mass, thyromegaly or thyroid tenderness.     Vascular: No carotid bruit or JVD.     Trachea: Trachea and phonation normal.  Cardiovascular:     Rate and Rhythm: Normal rate and regular rhythm.     Chest Wall: PMI is not displaced.     Pulses: Normal pulses.     Heart sounds: Normal heart sounds. No murmur heard.    No friction rub. No gallop.  Pulmonary:     Effort: Pulmonary effort is normal. No respiratory distress.     Breath sounds: Normal breath sounds. No wheezing.  Abdominal:     General: Bowel sounds are normal. There is no distension or abdominal bruit.     Palpations: Abdomen  is soft. There is no hepatomegaly or splenomegaly.     Tenderness: There is no abdominal tenderness. There is no right CVA tenderness or left CVA tenderness.     Hernia: No hernia is present.  Musculoskeletal:        General: Normal range of motion.     Cervical back: Normal range of motion and neck supple.     Right lower leg: No edema.     Left lower leg: No edema.  Lymphadenopathy:     Cervical: No cervical adenopathy.  Skin:    General: Skin is warm and dry.     Capillary Refill: Capillary refill takes less than 2 seconds.     Coloration: Skin is not cyanotic, jaundiced or pale.     Findings: No rash.  Neurological:     General: No focal deficit present.     Mental Status: She is alert and oriented to person, place, and time.     Sensory: Sensation is intact.     Motor: Motor function is intact.     Coordination: Coordination is intact.     Gait: Gait abnormal (using cane).     Deep Tendon Reflexes: Reflexes are normal and symmetric.  Psychiatric:        Attention and Perception: Attention and perception normal.        Mood and Affect: Mood and affect normal.        Speech: Speech normal.  Behavior: Behavior normal. Behavior is cooperative.        Thought Content: Thought content normal.        Cognition and Memory: Cognition and memory normal.        Judgment: Judgment normal.     Results for orders placed or performed during the hospital encounter of 10/13/22  Osmolality, urine  Result Value Ref Range   Osmolality, Ur 438 300 - 900 mOsm/kg  Osmolality  Result Value Ref Range   Osmolality 285 275 - 295 mOsm/kg  Creatinine, urine, random  Result Value Ref Range   Creatinine, Urine 66 mg/dL  Sodium, urine, random  Result Value Ref Range   Sodium, Ur 50 mmol/L  CBC  Result Value Ref Range   WBC 7.5 4.0 - 10.5 K/uL   RBC 3.42 (L) 3.87 - 5.11 MIL/uL   Hemoglobin 11.6 (L) 12.0 - 15.0 g/dL   HCT 30.8 (L) 65.7 - 84.6 %   MCV 100.6 (H) 80.0 - 100.0 fL   MCH  33.9 26.0 - 34.0 pg   MCHC 33.7 30.0 - 36.0 g/dL   RDW 96.2 95.2 - 84.1 %   Platelets 338 150 - 400 K/uL   nRBC 0.0 0.0 - 0.2 %  Renal function panel  Result Value Ref Range   Sodium 132 (L) 135 - 145 mmol/L   Potassium 3.6 3.5 - 5.1 mmol/L   Chloride 97 (L) 98 - 111 mmol/L   CO2 28 22 - 32 mmol/L   Glucose, Bld 86 70 - 99 mg/dL   BUN 19 8 - 23 mg/dL   Creatinine, Ser 3.24 0.44 - 1.00 mg/dL   Calcium 9.0 8.9 - 40.1 mg/dL   Phosphorus 3.7 2.5 - 4.6 mg/dL   Albumin 4.4 3.5 - 5.0 g/dL   GFR, Estimated >02 >72 mL/min   Anion gap 7 5 - 15       Pertinent labs & imaging results that were available during my care of the patient were reviewed by me and considered in my medical decision making.  Assessment & Plan:  Samantha Mcfarland was seen today for off balance.  Diagnoses and all orders for this visit:  Balance problem Ongoing for several years. Declined referral to PT. Declined to follow up with neurology and ENT. Will check below labs for potential underlying triggers.  -     AMB Referral to Chronic Care Management Services -     CBC with Differential/Platelet -     CMP14+EGFR -     Lipid panel -     Thyroid Panel With TSH -     VITAMIN D 25 Hydroxy (Vit-D Deficiency, Fractures) -     Vitamin B12 -     Vitamin B1 -     Folate  Essential hypertension BP well controlled. Changes were not made in regimen today. Goal BP is 130/80. Pt aware to report any persistent high or low readings. DASH diet and exercise encouraged. Exercise at least 150 minutes per week and increase as tolerated. Goal BMI > 25. Stress management encouraged. Avoid nicotine and tobacco product use. Avoid excessive alcohol and NSAID's. Avoid more than 2000 mg of sodium daily. Medications as prescribed. Follow up as scheduled.  -     AMB Referral to Chronic Care Management Services -     CBC with Differential/Platelet -     CMP14+EGFR -     Lipid panel -     Thyroid Panel With TSH  Mixed hyperlipidemia Diet encouraged  - increase  intake of fresh fruits and vegetables, increase intake of lean proteins. Bake, broil, or grill foods. Avoid fried, greasy, and fatty foods. Avoid fast foods. Increase intake of fiber-rich whole grains. Exercise encouraged - at least 150 minutes per week and advance as tolerated.  Goal BMI < 25. Continue medications as prescribed. Follow up in 3-6 months as discussed.  -     AMB Referral to Chronic Care Management Services -     CMP14+EGFR -     Lipid panel  Hyponatremia Followed by nephrology on a regular basis. Will check labs today.  -     AMB Referral to Chronic Care Management Services -     CBC with Differential/Platelet -     CMP14+EGFR  Vitamin B12 deficiency Will recheck labs today.  -     AMB Referral to Chronic Care Management Services -     Vitamin B12  History of vitamin D deficiency Labs pending. Continue repletion therapy. If indicated, will change repletion dosage. Eat foods rich in Vit D including milk, orange juice, yogurt with vitamin D added, salmon or mackerel, canned tuna fish, cereals with vitamin D added, and cod liver oil. Get out in the sun but make sure to wear at least SPF 30 sunscreen.  -     AMB Referral to Chronic Care Management Services -     VITAMIN D 25 Hydroxy (Vit-D Deficiency, Fractures)  Current drinker States she has cut back on alcohol consumption. Will check labs today.  -     CBC with Differential/Platelet -     CMP14+EGFR -     Lipid panel -     Thyroid Panel With TSH -     VITAMIN D 25 Hydroxy (Vit-D Deficiency, Fractures) -     Vitamin B12 -     Vitamin B1 -     Folate     Continue all other maintenance medications.  Follow up plan: Return in about 6 months (around 05/26/2023), or if symptoms worsen or fail to improve, for chronic follow up.   Continue healthy lifestyle choices, including diet (rich in fruits, vegetables, and lean proteins, and low in salt and simple carbohydrates) and exercise (at least 30 minutes of  moderate physical activity daily).    The above assessment and management plan was discussed with the patient. The patient verbalized understanding of and has agreed to the management plan. Patient is aware to call the clinic if they develop any new symptoms or if symptoms persist or worsen. Patient is aware when to return to the clinic for a follow-up visit. Patient educated on when it is appropriate to go to the emergency department.   Kari Baars, FNP-C [facility] [phone]

## 2022-11-25 ENCOUNTER — Telehealth: Payer: Self-pay

## 2022-11-25 LAB — CBC WITH DIFFERENTIAL/PLATELET
Basos: 1 %
Eos: 12 %
Immature Grans (Abs): 0 10*3/uL (ref 0.0–0.1)
Lymphocytes Absolute: 1.7 10*3/uL (ref 0.7–3.1)
Monocytes Absolute: 0.5 10*3/uL (ref 0.1–0.9)

## 2022-11-25 LAB — VITAMIN B1

## 2022-11-25 LAB — CMP14+EGFR
Bilirubin Total: 0.4 mg/dL (ref 0.0–1.2)
Creatinine, Ser: 0.66 mg/dL (ref 0.57–1.00)
Glucose: 84 mg/dL (ref 70–99)

## 2022-11-25 LAB — LIPID PANEL: VLDL Cholesterol Cal: 26 mg/dL (ref 5–40)

## 2022-11-25 LAB — VITAMIN D 25 HYDROXY (VIT D DEFICIENCY, FRACTURES): Vit D, 25-Hydroxy: 45.2 ng/mL (ref 30.0–100.0)

## 2022-11-25 NOTE — Progress Notes (Signed)
  Chronic Care Management   Note  11/25/2022 Name: Samantha Mcfarland MRN: [id number] DOB: [birth year]  Samantha Mcfarland is a 67 y.o. year old female who is a primary care patient of Rakes, Doralee Albino, FNP. I reached out to [name] by phone today in response to a referral sent by [name] PCP.  [name] was given information about Chronic Care Management services today including:  CCM service includes personalized support from designated clinical staff supervised by the physician, including individualized plan of care and coordination with other care providers 24/7 contact phone numbers for assistance for urgent and routine care needs. Service will only be billed when office clinical staff spend 20 minutes or more in a month to coordinate care. Only one practitioner may furnish and bill the service in a calendar month. The patient may stop CCM services at amy time (effective at the end of the month) by phone call to the office staff. The patient will be responsible for cost sharing (co-pay) or up to 20% of the service fee (after annual deductible is met)  Ms. [name]  agreedto scheduling an appointment with the CCM RN Case Manager   Follow up plan: Patient agreed to scheduled appointment with RN Case Manager on 12/10/2022 LCSW 12/14/2022(date/time).   Penne Lash, RMA Care Guide [facility]  [place], [place] [address] Direct Dial: [phone] [email or web address]

## 2022-11-29 LAB — CMP14+EGFR
ALT: 18 IU/L (ref 0–32)
AST: 33 IU/L (ref 0–40)
Albumin: 4.3 g/dL (ref 3.9–4.9)
BUN/Creatinine Ratio: 11 — ABNORMAL LOW (ref 12–28)
BUN: 7 mg/dL — ABNORMAL LOW (ref 8–27)
CO2: 22 mmol/L (ref 20–29)
Calcium: 9.1 mg/dL (ref 8.7–10.3)
Chloride: 91 mmol/L — ABNORMAL LOW (ref 96–106)
Potassium: 4 mmol/L (ref 3.5–5.2)
Sodium: 124 mmol/L — ABNORMAL LOW (ref 134–144)
Total Protein: 6.1 g/dL (ref 6.0–8.5)
eGFR: 97 mL/min/{1.73_m2} (ref >59)

## 2022-11-29 LAB — LIPID PANEL
Chol/HDL Ratio: 2.5 ratio (ref 0.0–4.4)
HDL: 56 mg/dL (ref >39)
LDL Chol Calc (NIH): 58 mg/dL (ref 0–99)
Triglycerides: 154 mg/dL — ABNORMAL HIGH (ref 0–149)

## 2022-11-29 LAB — CBC WITH DIFFERENTIAL/PLATELET
Basophils Absolute: 0.1 10*3/uL (ref 0.0–0.2)
Hematocrit: 33.9 % — ABNORMAL LOW (ref 34.0–46.6)
Hemoglobin: 11.3 g/dL (ref 11.1–15.9)
Lymphs: 35 %
MCV: 97 fL (ref 79–97)
Monocytes: 11 %
Neutrophils Absolute: 2 10*3/uL (ref 1.4–7.0)
Neutrophils: 41 %
Platelets: 340 10*3/uL (ref 150–450)
RDW: 12 % (ref 11.7–15.4)

## 2022-11-29 LAB — VITAMIN B12: Vitamin B-12: >2000 pg/mL — ABNORMAL HIGH (ref 232–1245)

## 2022-11-29 LAB — FOLATE: Folate: >20 ng/mL (ref >3.0)

## 2022-11-29 LAB — THYROID PANEL WITH TSH
Free Thyroxine Index: 0.9 — ABNORMAL LOW (ref 1.2–4.9)
T3 Uptake Ratio: 24 % (ref 24–39)
T4, Total: 3.6 ug/dL — ABNORMAL LOW (ref 4.5–12.0)

## 2022-12-10 ENCOUNTER — Telehealth: Payer: 59

## 2022-12-14 ENCOUNTER — Ambulatory Visit: Payer: Self-pay | Admitting: *Deleted

## 2022-12-14 ENCOUNTER — Encounter: Payer: Self-pay | Admitting: *Deleted

## 2022-12-14 DIAGNOSIS — M8589 Other specified disorders of bone density and structure, multiple sites: Secondary | ICD-10-CM

## 2022-12-14 DIAGNOSIS — I10 Essential (primary) hypertension: Secondary | ICD-10-CM

## 2022-12-14 DIAGNOSIS — E782 Mixed hyperlipidemia: Secondary | ICD-10-CM

## 2022-12-14 DIAGNOSIS — E876 Hypokalemia: Secondary | ICD-10-CM

## 2022-12-15 ENCOUNTER — Encounter: Payer: Self-pay | Admitting: *Deleted

## 2022-12-15 ENCOUNTER — Ambulatory Visit (INDEPENDENT_AMBULATORY_CARE_PROVIDER_SITE_OTHER): Payer: 59 | Admitting: *Deleted

## 2022-12-15 DIAGNOSIS — E782 Mixed hyperlipidemia: Secondary | ICD-10-CM

## 2022-12-15 DIAGNOSIS — I10 Essential (primary) hypertension: Secondary | ICD-10-CM

## 2022-12-15 NOTE — Chronic Care Management (AMB) (Signed)
Chronic Care Management   CCM RN Visit Note  12/15/2022 Name: Samantha Mcfarland MRN: [id number] DOB: 05/16/1956  Subjective: Samantha Mcfarland is a 67 y.o. year old female who is a primary care patient of Rakes, Doralee Albino, FNP. The patient was referred to the Chronic Care Management team for assistance with care management needs subsequent to provider initiation of CCM services and plan of care.    Today's Visit:  Engaged with patient by telephone for initial visit.     SDOH Interventions Today    Flowsheet Row Most Recent Value  SDOH Interventions   Food Insecurity Interventions Other (Comment)  [working with LCSW]  Housing Interventions Intervention Not Indicated  Transportation Interventions Other (Comment)  [working with LCSW and resources given]  Utilities Interventions Intervention Not Indicated  Financial Strain Interventions Other (Comment), [facility] Children[facility]  [LCSW is working with pt]  Physical Activity Interventions Patient Declined  [pt states she is not able to exercise, uses walker to ambulate]  Stress Interventions Other (Comment)  [pt is working with LCSW]  Social Connections Interventions Patient Declined         Goals Addressed             This Visit's Progress    CCM (HYPERLIPIDEMIA) EXPECTED OUTCOME: MONITOR, SELF-MANAGE AND REDUCE SYMPTOMS OF HYPERLIPIDEMIA       Current Barriers:  Knowledge Deficits related to Hyperlipidemia management Chronic Disease Management support and education needs related to Hyperlipidemia, diet Financial Constraints.  Patient reports she has all medications and taking as prescribed  Planned Interventions: Provider established cholesterol goals reviewed; Counseled on importance of regular laboratory monitoring as prescribed; Provided HLD educational materials; Reviewed importance of limiting foods high in cholesterol; Reviewed exercise goals and target of 150 minutes per week; Screening for signs and symptoms of depression  related to chronic disease state;  Assessed social determinant of health barriers;   Symptom Management: Take medications as prescribed   Attend all scheduled provider appointments Call pharmacy for medication refills 3-7 days in advance of running out of medications Attend church or other social activities Call provider office for new concerns or questions  Work with the social worker to address care coordination needs and will continue to work with the clinical team to address health care and disease management related needs - call doctor with any symptoms you believe are related to your medicine - call doctor when you experience any new symptoms - go to all doctor appointments as scheduled - adhere to prescribed diet: heart healthy - develop an exercise routine Education mailed- heart healthy diet  Follow Up Plan: Telephone follow up appointment with care management team member scheduled for:  02/02/23 at 945 am       CCM (HYPERTENSION) EXPECTED OUTCOME: SELF-MANAGE AND REDUCE SYMPTOMS OF HYPERTENSION       Current Barriers:  Knowledge Deficits related to Hypertension management Care Coordination needs related to social work/ financial needs in a patient with Hypertension Chronic Disease Management support and education needs related to Hypertension, diet Lacks caregiver support.  Corporate treasurer.  No Advanced Directives in place- pt reports she is in process of completing, needs to have notarized Patient reports she lives alone, has a few friends she can call on if needed and they provide transportation, pt reports she uses a walker most of the time, is high risk for falls Patient reports she does not monitor blood pressure, states she will check into getting a blood pressure cuff  Planned Interventions: Evaluation of current  treatment plan related to hypertension self management and patient's adherence to plan as established by provider;   Reviewed medications with patient  and discussed importance of compliance;  Counseled on the importance of exercise goals with target of 150 minutes per week Discussed plans with patient for ongoing care management follow up and provided patient with direct contact information for care management team; Advised patient, providing education and rationale, to monitor blood pressure daily and record, calling PCP for findings outside established parameters;   Discussed complications of poorly controlled blood pressure such as heart disease, stroke, circulatory complications, vision complications, kidney impairment, sexual dysfunction;  Screening for signs and symptoms of depression related to chronic disease state;  Assessed social determinant of health barriers;  Reviewed safety precautions Encouraged pt to have advanced directives notarized  Symptom Management: Take medications as prescribed   Attend all scheduled provider appointments Call pharmacy for medication refills 3-7 days in advance of running out of medications Attend church or other social activities Call provider office for new concerns or questions  Work with the social worker to address care coordination needs and will continue to work with the clinical team to address health care and disease management related needs check blood pressure weekly choose a place to take my blood pressure (home, clinic or office, retail store) write blood pressure results in a log or diary learn about high blood pressure keep a blood pressure log take blood pressure log to all doctor appointments keep all doctor appointments take medications for blood pressure exactly as prescribed report new symptoms to your doctor eat more whole grains, fruits and vegetables, lean meats and healthy fats Have advanced directives notarized fall prevention strategies: q position slowly, use assistive device such as walker or cane (per provider recommendations) when walking, keep walkways clear, have  good lighting in room. It is important to contact your provider if you have any falls, maintain muscle strength/tone by exercise per provider recommendations.  Follow Up Plan: Telephone follow up appointment with care management team member scheduled for: 02/02/23 at 945 am          Plan:Telephone follow up appointment with care management team member scheduled for:  02/02/23 at 945 am  Irving Shows [facility], BSN RN Case Manager [facility] [phone]

## 2022-12-15 NOTE — Patient Instructions (Addendum)
Please call the care guide team at [phone] if you need to cancel or reschedule your appointment.   If you are experiencing a Mental Health or Behavioral Health Crisis or need someone to talk to, please call the Suicide and Crisis Lifeline: 988 call the [place] National Suicide Prevention Lifeline: (248)048-6597 or TTY: (808)100-5913 TTY (323)115-0534) to talk to a trained counselor call 1-800-273-TALK (toll free, 24 hour hotline) go to [facility] [address] [place] [phone]) call the [facility]: [phone] call 911   Following is a copy of the CCM Program Consent:  CCM service includes personalized support from designated clinical staff supervised by the physician, including individualized plan of care and coordination with other care providers 24/7 contact phone numbers for assistance for urgent and routine care needs. Service will only be billed when office clinical staff spend 20 minutes or more in a month to coordinate care. Only one practitioner may furnish and bill the service in a calendar month. The patient may stop CCM services at amy time (effective at the end of the month) by phone call to the office staff. The patient will be responsible for cost sharing (co-pay) or up to 20% of the service fee (after annual deductible is met)  Following is a copy of your full provider care plan:   Goals Addressed             This Visit's Progress    CCM (HYPERLIPIDEMIA) EXPECTED OUTCOME: MONITOR, SELF-MANAGE AND REDUCE SYMPTOMS OF HYPERLIPIDEMIA       Current Barriers:  Knowledge Deficits related to Hyperlipidemia management Chronic Disease Management support and education needs related to Hyperlipidemia, diet Financial Constraints.  Patient reports she has all medications and taking as prescribed  Planned Interventions: Provider established cholesterol goals reviewed; Counseled on importance of regular laboratory  monitoring as prescribed; Provided HLD educational materials; Reviewed importance of limiting foods high in cholesterol; Reviewed exercise goals and target of 150 minutes per week; Screening for signs and symptoms of depression related to chronic disease state;  Assessed social determinant of health barriers;   Symptom Management: Take medications as prescribed   Attend all scheduled provider appointments Call pharmacy for medication refills 3-7 days in advance of running out of medications Attend church or other social activities Call provider office for new concerns or questions  Work with the social worker to address care coordination needs and will continue to work with the clinical team to address health care and disease management related needs - call doctor with any symptoms you believe are related to your medicine - call doctor when you experience any new symptoms - go to all doctor appointments as scheduled - adhere to prescribed diet: heart healthy - develop an exercise routine Education mailed- heart healthy diet  Follow Up Plan: Telephone follow up appointment with care management team member scheduled for:  02/02/23 at 945 am       CCM (HYPERTENSION) EXPECTED OUTCOME: SELF-MANAGE AND REDUCE SYMPTOMS OF HYPERTENSION       Current Barriers:  Knowledge Deficits related to Hypertension management Care Coordination needs related to social work/ financial needs in a patient with Hypertension Chronic Disease Management support and education needs related to Hypertension, diet Lacks caregiver support.  Corporate treasurer.  No Advanced Directives in place- pt reports she is in process of completing, needs to have notarized Patient reports she lives alone, has a few friends she can call on if needed and they provide transportation, pt reports  she uses a walker most of the time, is high risk for falls Patient reports she does not monitor blood pressure, states she will check into  getting a blood pressure cuff  Planned Interventions: Evaluation of current treatment plan related to hypertension self management and patient's adherence to plan as established by provider;   Reviewed medications with patient and discussed importance of compliance;  Counseled on the importance of exercise goals with target of 150 minutes per week Discussed plans with patient for ongoing care management follow up and provided patient with direct contact information for care management team; Advised patient, providing education and rationale, to monitor blood pressure daily and record, calling PCP for findings outside established parameters;  Discussed complications of poorly controlled blood pressure such as heart disease, stroke, circulatory complications, vision complications, kidney impairment, sexual dysfunction;  Screening for signs and symptoms of depression related to chronic disease state;  Assessed social determinant of health barriers;  Reviewed safety precautions Encouraged pt to have advanced directives notarized  Symptom Management: Take medications as prescribed   Attend all scheduled provider appointments Call pharmacy for medication refills 3-7 days in advance of running out of medications Attend church or other social activities Call provider office for new concerns or questions  Work with the social worker to address care coordination needs and will continue to work with the clinical team to address health care and disease management related needs check blood pressure weekly choose a place to take my blood pressure (home, clinic or office, retail store) write blood pressure results in a log or diary learn about high blood pressure keep a blood pressure log take blood pressure log to all doctor appointments keep all doctor appointments take medications for blood pressure exactly as prescribed report new symptoms to your doctor eat more whole grains, fruits and  vegetables, lean meats and healthy fats Have advanced directives notarized fall prevention strategies: q position slowly, use assistive device such as walker or cane (per provider recommendations) when walking, keep walkways clear, have good lighting in room. It is important to contact your provider if you have any falls, maintain muscle strength/tone by exercise per provider recommendations.  Follow Up Plan: Telephone follow up appointment with care management team member scheduled for: 02/02/23 at 945 am          Patient verbalizes understanding of instructions and care plan provided today and agrees to view in MyChart. Active MyChart status and patient understanding of how to access instructions and care plan via MyChart confirmed with patient.  Telephone follow up appointment with care management team member scheduled for:  02/02/23 at 945 am  Heart-Healthy Eating Plan Eating a healthy diet is important for the health of your heart. A heart-healthy eating plan includes: Eating less unhealthy fats. Eating more healthy fats. Eating less salt in your food. Salt is also called sodium. Making other changes in your diet. Talk with your doctor or a diet specialist (dietitian) to create an eating plan that is right for you. What is my plan? Your doctor may recommend an eating plan that includes: Total fat: ______% or less of total calories a day. Saturated fat: ______% or less of total calories a day. Cholesterol: less than _________mg a day. Sodium: less than _________mg a day. What are tips for following this plan? Cooking Avoid frying your food. Try to bake, boil, grill, or broil it instead. You can also reduce fat by: Removing the skin from poultry. Removing all visible fats from meats. Steaming  vegetables in water or broth. Meal planning  At meals, divide your plate into four equal parts: Fill one-half of your plate with vegetables and green salads. Fill one-fourth of your plate  with whole grains. Fill one-fourth of your plate with lean protein foods. Eat 2-4 cups of vegetables per day. One cup of vegetables is: 1 cup (91 g) broccoli or cauliflower florets. 2 medium carrots. 1 large bell pepper. 1 large sweet potato. 1 large tomato. 1 medium white potato. 2 cups (150 g) raw leafy greens. Eat 1-2 cups of fruit per day. One cup of fruit is: 1 small apple 1 large banana 1 cup (237 g) mixed fruit, 1 large orange,  cup (82 g) dried fruit, 1 cup (240 mL) 100% fruit juice. Eat more foods that have soluble fiber. These are apples, broccoli, carrots, beans, peas, and barley. Try to get 20-30 g of fiber per day. Eat 4-5 servings of nuts, legumes, and seeds per week: 1 serving of dried beans or legumes equals  cup (90 g) cooked. 1 serving of nuts is  oz (12 almonds, 24 pistachios, or 7 walnut halves). 1 serving of seeds equals  oz (8 g). General information Eat more home-cooked food. Eat less restaurant, buffet, and fast food. Limit or avoid alcohol. Limit foods that are high in starch and sugar. Avoid fried foods. Lose weight if you are overweight. Keep track of how much salt (sodium) you eat. This is important if you have high blood pressure. Ask your doctor to tell you more about this. Try to add vegetarian meals each week. Fats Choose healthy fats. These include olive oil and canola oil, flaxseeds, walnuts, almonds, and seeds. Eat more omega-3 fats. These include salmon, mackerel, sardines, tuna, flaxseed oil, and ground flaxseeds. Try to eat fish at least 2 times each week. Check food labels. Avoid foods with trans fats or high amounts of saturated fat. Limit saturated fats. These are often found in animal products, such as meats, butter, and cream. These are also found in plant foods, such as palm oil, palm kernel oil, and coconut oil. Avoid foods with partially hydrogenated oils in them. These have trans fats. Examples are stick margarine, some tub  margarines, cookies, crackers, and other baked goods. What foods should I eat? Fruits All fresh, canned (in natural juice), or frozen fruits. Vegetables Fresh or frozen vegetables (raw, steamed, roasted, or grilled). Green salads. Grains Most grains. Choose whole wheat and whole grains most of the time. Rice and pasta, including brown rice and pastas made with whole wheat. Meats and other proteins Lean, well-trimmed beef, veal, pork, and lamb. Chicken and [place] without skin. All fish and shellfish. Wild duck, rabbit, pheasant, and venison. Egg whites or low-cholesterol egg substitutes. Dried beans, peas, lentils, and tofu. Seeds and most nuts. Dairy Low-fat or nonfat cheeses, including ricotta and mozzarella. Skim or 1% milk that is liquid, powdered, or evaporated. Buttermilk that is made with low-fat milk. Nonfat or low-fat yogurt. Fats and oils Non-hydrogenated (trans-free) margarines. Vegetable oils, including soybean, sesame, sunflower, olive, peanut, safflower, corn, canola, and cottonseed. Salad dressings or mayonnaise made with a vegetable oil. Beverages Mineral water. Coffee and tea. Diet carbonated beverages. Sweets and desserts Sherbet, gelatin, and fruit ice. Small amounts of dark chocolate. Limit all sweets and desserts. Seasonings and condiments All seasonings and condiments. The items listed above may not be a complete list of foods and drinks you can eat. Contact a dietitian for more options. What foods should I avoid? Fruits  Canned fruit in heavy syrup. Fruit in cream or butter sauce. Fried fruit. Limit coconut. Vegetables Vegetables cooked in cheese, cream, or butter sauce. Fried vegetables. Grains Breads that are made with saturated or trans fats, oils, or whole milk. Croissants. Sweet rolls. Donuts. High-fat crackers, such as cheese crackers. Meats and other proteins Fatty meats, such as hot dogs, ribs, sausage, bacon, rib-eye roast or steak. High-fat deli meats,  such as salami and bologna. Caviar. Domestic duck and goose. Organ meats, such as liver. Dairy Cream, sour cream, cream cheese, and creamed cottage cheese. Whole-milk cheeses. Whole or 2% milk that is liquid, evaporated, or condensed. Whole buttermilk. Cream sauce or high-fat cheese sauce. Yogurt that is made from whole milk. Fats and oils Meat fat, or shortening. Cocoa butter, hydrogenated oils, palm oil, coconut oil, palm kernel oil. Solid fats and shortenings, including bacon fat, salt pork, lard, and butter. Nondairy cream substitutes. Salad dressings with cheese or sour cream. Beverages Regular sodas and juice drinks with added sugar. Sweets and desserts Frosting. Pudding. Cookies. Cakes. Pies. Milk chocolate or white chocolate. Buttered syrups. Full-fat ice cream or ice cream drinks. The items listed above may not be a complete list of foods and drinks to avoid. Contact a dietitian for more information. Summary Heart-healthy meal planning includes eating less unhealthy fats, eating more healthy fats, and making other changes in your diet. Eat a balanced diet. This includes fruits and vegetables, low-fat or nonfat dairy, lean protein, nuts and legumes, whole grains, and heart-healthy oils and fats. This information is not intended to replace advice given to you by your health care provider. Make sure you discuss any questions you have with your health care provider. Document Revised: 07/13/2021 Document Reviewed: 07/13/2021 Elsevier Patient Education  2024 [employer].

## 2022-12-15 NOTE — Patient Outreach (Signed)
Care Coordination   Initial Visit Note   12/15/2022 - Late Entry  Name: Samantha Mcfarland MRN: [id number] DOB: August 16, 1955  Samantha Mcfarland is a 67 y.o. year old female who sees Rakes, Doralee Albino, FNP for primary care. I spoke with [name] by phone today.  What matters to the patients health and wellness today?  Find Help in My Community.   Goals Addressed               This Visit's Progress     Find Help in My Community. (pt-stated)   On track     Care Coordination Interventions:  Interventions Today    Flowsheet Row Most Recent Value  Chronic Disease   Chronic disease during today's visit Hypertension (HTN), Other  [Food Insecurity, Financial Difficulties, Lack of Reliable Transportation, Generalized Anxiety Disorder, Limited Social & Family Support, Moderate, Recurrent Major Depression]  General Interventions   General Interventions Discussed/Reviewed General Interventions Discussed, Labs, Vaccines, Doctor Visits, General Interventions Reviewed, Annual Eye Exam, Durable Medical Equipment (DME), [employer], Level of Care, Communication with, Annual Foot Exam, Health Screening, Lipid Profile  [Encouraged]  Labs Kidney Function, Hgb A1c annually  [Encouraged]  Vaccines COVID-19, Flu, Pneumonia, RSV, Shingles, Tetanus/Pertussis/Diphtheria  [Encouraged]  Doctor Visits Discussed/Reviewed Doctor Visits Discussed, Specialist, Doctor Visits Reviewed, Annual Wellness Visits, PCP  [Encouraged]  Health Screening Bone Density, Colonoscopy, Mammogram  [Encouraged]  Durable Medical Equipment (DME) Walker, BP Cuff, Other  [Glasses]  PCP/Specialist Visits Compliance with follow-up visit  [Encouraged]  Communication with PCP/Specialists, RN  [Encouraged]  Level of Care Adult Daycare, Applications, Assisted Living, Personal Care Services  [Encouraged]  Applications Personal Care Services, FL-2  [Encouraged]  Exercise Interventions   Exercise Discussed/Reviewed Exercise Discussed, Assistive  device use and maintanence, Exercise Reviewed, Physical Activity, Weight Managment  [Encouraged]  Physical Activity Discussed/Reviewed Physical Activity Discussed, Home Exercise Program (HEP), Physical Activity Reviewed, Types of exercise, Gym  [Encouraged]  Weight Management Weight maintenance  [Encouraged]  Education Interventions   Education Provided Provided Therapist, sports, Provided Web-based Education, Provided Education  [Encouraged]  Provided Verbal Education On Nutrition, Mental Health/Coping with Illness, When to see the doctor, Foot Care, Eye Care, Applications, Exercise, Labs, Medication, Development worker, community, [employer] Resources  [Encouraged]  Labs Reviewed Kidney Function  [Encouraged]  Applications Personal Care Services, FL-2  [Encouraged]  Mental Health Interventions   Mental Health Discussed/Reviewed Mental Health Discussed, Anxiety, Mental Health Reviewed, Depression, Grief and Loss, Coping Strategies, Crisis, Suicide, Substance Abuse, Other  [Domestic Violence]  Nutrition Interventions   Nutrition Discussed/Reviewed Nutrition Discussed, Increasing proteins, Adding fruits and vegetables, Decreasing fats, Nutrition Reviewed, Fluid intake, Carbohydrate meal planning, Portion sizes, Decreasing sugar intake, Supplemental nutrition, Decreasing salt  [Encouraged]  Pharmacy Interventions   Pharmacy Dicussed/Reviewed Pharmacy Topics Discussed, Medications and their functions, Medication Adherence, Pharmacy Topics Reviewed, Affording Medications  [Encouraged]  Safety Interventions   Safety Discussed/Reviewed Safety Discussed, Safety Reviewed, Fall Risk, Home Safety  [Encouraged]  Home Safety Assistive Devices, Need for home safety assessment, Refer for community resources  [Encouraged]  Advanced Directive Interventions   Advanced Directives Discussed/Reviewed Advanced Directives Discussed  [Encouraged]     Assessed Social Determinant of Health Barriers. Discussed Plans for Ongoing Care  Management Follow-Up. Provided Careers information officer Information for Care Management Team Members. Screened for Signs & Symptoms of Anxiety & Depression, Related to Chronic Disease State.  PHQ2 & PHQ9 Depression Screen Completed & Results Reviewed.  Suicidal Ideation & Homicidal Ideation Assessed - None Present.   Domestic Violence Assessed - None  Present. Access to Weapons Assessed - None Present.   Active Listening & Reflection Utilized.  Verbalization of Feelings Encouraged.  Emotional Support Provided. Feelings of Sadness Regarding Loss of Independence Validated. Symptoms of Depression & Anxiety Acknowledged. Crisis Support Information, Agencies, Services & Resources Discussed. Problem Solving Interventions Identified. Task-Centered Solutions Implemented.   Solution-Focused Strategies Developed. Acceptance & Commitment Therapy Introduced. Brief Cognitive Behavioral Therapy Initiated. Client-Centered Therapy Enacted. Reviewed Prescription Medications & Discussed Importance of Compliance. Quality of Sleep Assessed & Sleep Hygiene Techniques Promoted. Deep Breathing Exercises, Relaxation Techniques, & Mindfulness Meditation Strategies Taught & Implementation Encouraged Daily. Encouraged Referral to Psychiatrist for Psychotropic Medication Administration & Management, From List Provided. Encouraged Referral to Therapist for Psychotherapeutic Counseling & Supportive Services, From List Provided. Emphasized Importance of Quitting Smoking & Offered Smoking Cessation Classes, Services, Agencies, & Resources, & Agreed to Assist with Referral Process. Discussed Higher Level of Care Options (I.e. Rest Home, Assisted Living, Group Home, Family Care, Etc.) & Encouraged Consideration. Reviewed Materials engineer through [employer] & [employer]. Confirmed Active [employer] by Conversing with Kristin Bruins, Medicaid Case Worker with [facility]  [facility] Department of Social Services [phone]). Confirmed Interest in Applying for Personal Care Services, through [facility] [phone]), Agreeing to Assist with Application Completion & Submission. Requested Review of The Following List of [name], [employer], Resources, Occupational psychologist, Mailed on 12/15/2022: ~ Neurosurgeon in [place] ~ Engineer, drilling ~ TEFL teacher ~ Low Income Chief Technology Officer ~ [place] [facility] Program Flyer ~ [employer] Support & Nutrition Program Referral Form ~ Armed forces logistics/support/administrative officer, Stage manager, & Soup Kitchens in [place] ~ Merchant navy officer ~ Theatre manager Providers ~ In-Home Care & Respite Agencies ~ Home Health Care Agencies ~ Respite Care Agencies & Facilities ~ Armed forces training and education officer Instructions Encouraged Careers information officer with [employer], Services, & Resources of Interest, in An Effort to [employer]. Encouraged Careers information officer with Food Banks, Food Pantries, & Soup Kitchens of Interest, in An Effort to [employer] & Nutritional Assistance. Encouraged Completion of Application for Personal Care Services & Submission to Primary Care Provider, Family Nurse Practitioner, [name] with [facility] [phone]), for Review, Signature, & Submission to [facility] [phone]), for Processing. Confirmed Monthly Allowance of $326.00 to Spend on Over-the-Counter Medications, Groceries, Etc., through [employer]. Confirmed Monthly Allowance of $58.00 in [employer], through [employer] of [employer] [phone]). Encouraged Completion of Application for [facility] [employer] & Submission to Aging, Disability, & Transit Services of  [place] [phone]), for Processing. Reminded of Transportation Benefits through [employer], [place] (# [phone]) & [employer] [phone]).  Confirmed Interest in Referral to Meals-on-Wheels, through Aging, Disability, & Transit Services of [place] [phone]). [id number] Referral Placed to [facility] Guides to Request Assistance with Referral to Meals-on-Wheels, through Aging, Disability, & Transit Services of [place] [phone]). Encouraged Use of Assistance Devices While Standing & Ambulating, to Reduce Risk of Falls & Injury. Encouraged Use of Hearing Aides & Taking Notes While Conversing with Medical Staff, to Assist with Remembering & Retaining Important Medical Information & Instructions. Encouraged Careers information officer with CSW (# [phone]), if You Have Questions, Need Assistance, or If Additional Social Work Needs Are Identified Between Now & Our Next Scheduled Follow-Up  Outreach Call.        SDOH assessments and interventions completed:  Yes.  SDOH Interventions Today    Flowsheet Row Most Recent Value  SDOH Interventions   Food Insecurity Interventions AMB Referral, Other (Comment)  [Provided Food Salomon Fick, Food Pantries & Soup Kitchens & Additional Food Resources]  Housing Interventions Intervention Not Indicated  Transportation Interventions AMB Referral, Patient Resources (Friends/Family), Payor Benefit, Other (Comment)  Pension scheme manager That Will Assist with Getting In & Out of House, In & Out of Car, In & Out of Provider Office, Etc.]  Utilities Interventions Intervention Not Indicated  Alcohol Usage Interventions Patient Declined, Alcohol Education/Brief Advice, Other (Comment)  [Offered Resources]  Financial Strain Interventions Other (Comment)  [Provided Financial Resources]  Physical Activity Interventions Intervention Not Indicated  Stress  Interventions Offered [employer], Provide Counseling, Other (Comment)  [Provided Counseling Resources]  Social Connections Interventions Intervention Not Indicated, Patient Declined     Care Coordination Interventions:  Yes, provided.   Follow up plan: Follow up call scheduled for 12/28/2022 at 11:00 am.   Encounter Outcome:  Pt. Visit Completed.   Danford Bad, BSW, MSW, LCSW  Licensed Restaurant manager, [facility]  Mailing [place] [address], [place], [place] [address] Physical Address-[address], [place], [place] [address] Toll Free Main # [phone] Fax # [phone] Cell # [phone] [email or web address]

## 2022-12-15 NOTE — Patient Instructions (Signed)
Visit Information  Thank you for taking time to visit with me today. Please don't hesitate to contact me if I can be of assistance to you.   Following are the goals we discussed today:   Goals Addressed               This Visit's Progress     Find Help in My Community. (pt-stated)   On track     Care Coordination Interventions:  Interventions Today    Flowsheet Row Most Recent Value  Chronic Disease   Chronic disease during today's visit Hypertension (HTN), Other  [Food Insecurity, Financial Difficulties, Lack of Reliable Transportation, Generalized Anxiety Disorder, Limited Social & Family Support, Moderate, Recurrent Major Depression]  General Interventions   General Interventions Discussed/Reviewed General Interventions Discussed, Labs, Vaccines, Doctor Visits, General Interventions Reviewed, Annual Eye Exam, Durable Medical Equipment (DME), [employer], Level of Care, Communication with, Annual Foot Exam, Health Screening, Lipid Profile  [Encouraged]  Labs Kidney Function, Hgb A1c annually  [Encouraged]  Vaccines COVID-19, Flu, Pneumonia, RSV, Shingles, Tetanus/Pertussis/Diphtheria  [Encouraged]  Doctor Visits Discussed/Reviewed Doctor Visits Discussed, Specialist, Doctor Visits Reviewed, Annual Wellness Visits, PCP  [Encouraged]  Health Screening Bone Density, Colonoscopy, Mammogram  [Encouraged]  Durable Medical Equipment (DME) Walker, BP Cuff, Other  [Glasses]  PCP/Specialist Visits Compliance with follow-up visit  [Encouraged]  Communication with PCP/Specialists, RN  [Encouraged]  Level of Care Adult Daycare, Applications, Assisted Living, Personal Care Services  [Encouraged]  Applications Personal Care Services, FL-2  [Encouraged]  Exercise Interventions   Exercise Discussed/Reviewed Exercise Discussed, Assistive device use and maintanence, Exercise Reviewed, Physical Activity, Weight Managment  [Encouraged]  Physical Activity Discussed/Reviewed Physical Activity  Discussed, Home Exercise Program (HEP), Physical Activity Reviewed, Types of exercise, Gym  [Encouraged]  Weight Management Weight maintenance  [Encouraged]  Education Interventions   Education Provided Provided Therapist, sports, Provided Web-based Education, Provided Education  [Encouraged]  Provided Verbal Education On Nutrition, Mental Health/Coping with Illness, When to see the doctor, Foot Care, Eye Care, Applications, Exercise, Labs, Medication, Development worker, community, [employer] Resources  [Encouraged]  Labs Reviewed Kidney Function  [Encouraged]  Applications Personal Care Services, FL-2  [Encouraged]  Mental Health Interventions   Mental Health Discussed/Reviewed Mental Health Discussed, Anxiety, Mental Health Reviewed, Depression, Grief and Loss, Coping Strategies, Crisis, Suicide, Substance Abuse, Other  [Domestic Violence]  Nutrition Interventions   Nutrition Discussed/Reviewed Nutrition Discussed, Increasing proteins, Adding fruits and vegetables, Decreasing fats, Nutrition Reviewed, Fluid intake, Carbohydrate meal planning, Portion sizes, Decreasing sugar intake, Supplemental nutrition, Decreasing salt  [Encouraged]  Pharmacy Interventions   Pharmacy Dicussed/Reviewed Pharmacy Topics Discussed, Medications and their functions, Medication Adherence, Pharmacy Topics Reviewed, Affording Medications  [Encouraged]  Safety Interventions   Safety Discussed/Reviewed Safety Discussed, Safety Reviewed, Fall Risk, Home Safety  [Encouraged]  Home Safety Assistive Devices, Need for home safety assessment, Refer for community resources  [Encouraged]  Advanced Directive Interventions   Advanced Directives Discussed/Reviewed Advanced Directives Discussed  [Encouraged]     Assessed Social Determinant of Health Barriers. Discussed Plans for Ongoing Care Management Follow-Up. Provided Careers information officer Information for Care Management Team Members. Screened for Signs & Symptoms of Anxiety & Depression,  Related to Chronic Disease State.  PHQ2 & PHQ9 Depression Screen Completed & Results Reviewed.  Suicidal Ideation & Homicidal Ideation Assessed - None Present.   Domestic Violence Assessed - None Present. Access to Weapons Assessed - None Present.   Active Listening & Reflection Utilized.  Verbalization of Feelings Encouraged.  Emotional Support Provided. Feelings of Sadness Regarding  Loss of Independence Validated. Symptoms of Depression & Anxiety Acknowledged. Crisis Support Information, Agencies, Services & Resources Discussed. Problem Solving Interventions Identified. Task-Centered Solutions Implemented.   Solution-Focused Strategies Developed. Acceptance & Commitment Therapy Introduced. Brief Cognitive Behavioral Therapy Initiated. Client-Centered Therapy Enacted. Reviewed Prescription Medications & Discussed Importance of Compliance. Quality of Sleep Assessed & Sleep Hygiene Techniques Promoted. Deep Breathing Exercises, Relaxation Techniques, & Mindfulness Meditation Strategies Taught & Implementation Encouraged Daily. Encouraged Referral to Psychiatrist for Psychotropic Medication Administration & Management, From List Provided. Encouraged Referral to Therapist for Psychotherapeutic Counseling & Supportive Services, From List Provided. Emphasized Importance of Quitting Smoking & Offered Smoking Cessation Classes, Services, Agencies, & Resources, & Agreed to Assist with Referral Process. Discussed Higher Level of Care Options (I.e. Rest Home, Assisted Living, Group Home, Family Care, Etc.) & Encouraged Consideration. Reviewed Materials engineer through [employer] & [employer]. Confirmed Active [employer] by Conversing with Kristin Bruins, Medicaid Case Worker with [facility] Department of Social Services [phone]). Confirmed Interest in Applying for Personal Care Services, through [facility] [phone]), Agreeing to Assist with Application Completion & Submission. Requested Review of The Following List of [name], Walt Disney, Resources, Occupational psychologist, Mailed on 12/15/2022: ~ Neurosurgeon in [place] ~ Engineer, drilling ~ TEFL teacher ~ Low Income Chief Technology Officer ~ [place] [facility] Program Flyer ~ [employer] Support & Nutrition Program Referral Form ~ Armed forces logistics/support/administrative officer, Stage manager, & Soup Kitchens in [place] ~ Merchant navy officer ~ Theatre manager Providers ~ In-Home Care & Respite Agencies ~ Home Health Care Agencies ~ Respite Care Agencies & Facilities ~ Armed forces training and education officer Instructions Encouraged Careers information officer with [employer], Services, & Resources of Interest, in An Effort to [employer]. Encouraged Careers information officer with Food Banks, Food Pantries, & Soup Kitchens of Interest, in An Effort to [employer] & Nutritional Assistance. Encouraged Completion of Application for Personal Care Services & Submission to Primary Care Provider, Family Nurse Practitioner, [name] with [facility] [phone]), for Review, Signature, & Submission to [facility] [phone]), for Processing. Confirmed Monthly Allowance of $326.00 to Spend on Over-the-Counter Medications, Groceries, Etc., through [employer]. Confirmed Monthly Allowance of $58.00 in [employer], through [employer] of [employer] [phone]). Encouraged Completion of Application for [facility] [employer] & Submission to Aging, Disability, & Transit Services of [place] [phone]), for Processing. Reminded of Transportation Benefits through [employer], [place] (# [phone]) & [employer] [phone]).  Confirmed Interest in Referral to Meals-on-Wheels, through Aging, Disability, & Transit Services of [place] [phone]). [id number] Referral Placed to [facility] Guides to Request Assistance with Referral to Meals-on-Wheels, through Aging, Disability, & Transit Services of [place] [phone]). Encouraged Use of Assistance Devices While Standing & Ambulating, to Reduce Risk of Falls & Injury. Encouraged Use of Hearing Aides & Taking Notes While Conversing with Medical Staff, to Assist with Remembering & Retaining Important Medical Information & Instructions. Encouraged Careers information officer with CSW (# [phone]), if You Have Questions, Need Assistance, or If Additional Social Work Needs Are Identified Between Now & Our Next Scheduled Follow-Up Outreach Call.      Our next appointment is by telephone on 12/28/2022 at 11:00 am.   Please call the care guide team at [phone]  if you need to cancel or reschedule your appointment.   If you are experiencing a Mental Health or Behavioral Health Crisis or need someone to talk to, please call the Suicide and Crisis Lifeline: 988 call the [place] National Suicide Prevention Lifeline: 619-127-0951 or TTY: (575)537-6434 TTY 618-603-0774) to talk to a trained counselor call 1-800-273-TALK (toll free, 24 hour hotline) go to [facility] [address] [place] [phone]) call the [facility] [place] Crisis Line: [phone] call 911  Patient verbalizes understanding of instructions and care plan provided today and agrees to view in MyChart. Active MyChart status and patient understanding of how to access instructions and care plan via MyChart confirmed with patient.     Telephone follow up appointment with care management team member scheduled for:  12/28/2022 at 11:00 am.   Danford Bad, BSW, MSW, LCSW   Licensed Clinical Social Worker  [employer] [facility]  Mailing [place]. [address] [place], [place] [address] Physical Address-[address], [place], [place] [address] Toll Free Main # [phone] Fax # [phone] Cell # [phone] [email or web address]

## 2022-12-15 NOTE — Plan of Care (Signed)
Chronic Care Management Provider Comprehensive Care Plan    12/15/2022 Name: Samantha Mcfarland MRN: [id number] DOB: 1956/05/24  Referral to Chronic Care Management (CCM) services was placed by Provider:  [name] FNP on Date: 11/24/22.  Chronic Condition 1: HYPERTENSION Provider Assessment and Plan  Essential hypertension Complaint with meds - Yes Current Medications - lasix Checking BP at home - no Exercising Regularly - No Watching Salt intake - No Pertinent ROS:  Headache - No Fatigue - No Visual Disturbances - No Chest pain - No Dyspnea - No Palpitations - No LE edema - No They report good compliance with medications and can restate their regimen by memory. No medication side effects.      BP Readings from Last 3 Encounters:  11/24/22 109/65  09/03/22 121/80  08/24/22 131/74      Expected Outcome/Goals Addressed This Visit (Provider CCM goals/Provider Assessment and plan  CCM (HYPERTENSION) EXPECTED OUTCOME: SELF-MANAGE AND REDUCE SYMPTOMS OF HYPERTENSION  Symptom Management Condition 1: Take medications as prescribed   Attend all scheduled provider appointments Call pharmacy for medication refills 3-7 days in advance of running out of medications Attend church or other social activities Call provider office for new concerns or questions  Work with the social worker to address care coordination needs and will continue to work with the clinical team to address health care and disease management related needs check blood pressure weekly choose a place to take my blood pressure (home, clinic or office, retail store) write blood pressure results in a log or diary learn about high blood pressure keep a blood pressure log take blood pressure log to all doctor appointments keep all doctor appointments take medications for blood pressure exactly as prescribed report new symptoms to your doctor eat more whole grains, fruits and vegetables, lean meats and healthy fats Have  advanced directives notarized fall prevention strategies: q position slowly, use assistive device such as walker or cane (per provider recommendations) when walking, keep walkways clear, have good lighting in room. It is important to contact your provider if you have any falls, maintain muscle strength/tone by exercise per provider recommendations.  Chronic Condition 2: HYPERLIPIDEMIA Provider Assessment and Plan   Mixed hyperlipidemia Compliant with medications - Yes Current medications - atorvastatin Side effects from medications - No Diet - does not follow a diet Exercise - no    Expected Outcome/Goals Addressed This Visit (Provider CCM goals/Provider Assessment and plan  CCM (HYPERLIPIDEMIA) EXPECTED OUTCOME: MONITOR, SELF-MANAGE AND REDUCE SYMPTOMS OF HYPERLIPIDEMIA  Symptom Management Condition 2: Take medications as prescribed   Attend all scheduled provider appointments Call pharmacy for medication refills 3-7 days in advance of running out of medications Attend church or other social activities Call provider office for new concerns or questions  Work with the social worker to address care coordination needs and will continue to work with the clinical team to address health care and disease management related needs - call doctor with any symptoms you believe are related to your medicine - call doctor when you experience any new symptoms - go to all doctor appointments as scheduled - adhere to prescribed diet: heart healthy - develop an exercise routine Education mailed- heart healthy diet  Problem List Patient Active Problem List   Diagnosis Date Noted   Presbycusis of both ears 09/28/2022   Ganglion cyst of dorsum of right wrist 07/27/2022   Tinnitus of both ears 07/27/2022   Syndrome of inappropriate vasopressin secretion (HCC) 06/10/2022   Prolonged QT interval 05/29/2022  Current drinker 03/02/2022   COVID-19 virus infection 03/02/2022   Hypokalemia 01/24/2022    Nausea vomiting and diarrhea 01/24/2022   Hyponatremia 01/23/2022   Port-A-Cath in place 05/07/2021   History of malignant neoplasm of breast 05/01/2021   Essential hypertension 05/01/2021   Mixed hyperlipidemia 05/01/2021   Generalized anxiety disorder 05/01/2021   Gastroesophageal reflux disease 05/01/2021   Moderate recurrent major depression (HCC) 05/01/2021   Osteopenia 04/30/2021   Breast cancer, left (HCC) 04/22/2021   Dislocation of cervical facet joint 06/03/2014    Medication Management  Current Outpatient Medications:    acetaminophen (TYLENOL) 325 MG tablet, Take 325 mg by mouth every 6 (six) hours as needed for moderate pain or headache., Disp: , Rfl:    anastrozole (ARIMIDEX) 1 MG tablet, TAKE 1 TABLET DAILY, Disp: 90 tablet, Rfl: 4   atorvastatin (LIPITOR) 20 MG tablet, Take 1 tablet (20 mg total) by mouth daily., Disp: 30 tablet, Rfl: 0   cholecalciferol (VITAMIN D3) 25 MCG (1000 UNIT) tablet, Take 1,000 Units by mouth daily., Disp: , Rfl:    diphenhydrAMINE HCl (ALLERGY MEDICATION PO), Take 1 tablet by mouth as needed (allergies)., Disp: , Rfl:    folic acid (FOLVITE) 1 MG tablet, Take by mouth., Disp: , Rfl:    furosemide (LASIX) 20 MG tablet, Take 0.5 tablets by mouth 2 (two) times daily., Disp: , Rfl:    gabapentin (NEURONTIN) 300 MG capsule, TAKE 1 CAPSULE 2 TIMES A DAY, Disp: 60 capsule, Rfl: 3   loperamide (IMODIUM) 2 MG capsule, Take 1 capsule (2 mg total) by mouth as needed for diarrhea or loose stools., Disp: 30 capsule, Rfl: 0   Multiple Vitamin (MULTIVITAMIN WITH MINERALS) TABS tablet, Take 1 tablet by mouth daily., Disp: , Rfl:    prochlorperazine (COMPAZINE) 10 MG tablet, TAKE ONE TABLET EVERY 6 HOURS AS NEEDED, Disp: 30 tablet, Rfl: 1   sodium chloride 1 g tablet, Take by mouth., Disp: , Rfl:    thiamine (VITAMIN B-1) 100 MG tablet, Take 1 tablet (100 mg total) by mouth daily., Disp: 30 tablet, Rfl: 1   vitamin B-12 (CYANOCOBALAMIN) 500 MCG tablet, Take  500 mcg by mouth daily., Disp: , Rfl:    folic acid (FOLVITE) 1 MG tablet, Take 1 tablet (1 mg total) by mouth daily. (Patient not taking: Reported on 12/15/2022), Disp: 30 tablet, Rfl: 1   ondansetron (ZOFRAN) 4 MG tablet, Take 1 tablet (4 mg total) by mouth every 8 (eight) hours as needed for nausea or vomiting. (Patient not taking: Reported on 12/15/2022), Disp: 20 tablet, Rfl: 0  Cognitive Assessment Identity Confirmed: : Name; DOB Cognitive Status: Normal Other:  : N/A   Functional Assessment Hearing Difficulty or Deaf: yes Hearing Management: bil hearing aides Wear Glasses or Blind: yes Vision Management: can see well w/ glasses Concentrating, Remembering or Making Decisions Difficulty (CP): no Difficulty Communicating: no Difficulty Eating/Swallowing: no Walking or Climbing Stairs Difficulty: yes Walking or Climbing Stairs: ambulation difficulty, requires equipment Mobility Management: uses walker Dressing/Bathing Difficulty: yes Dressing/Bathing: bathing difficulty, requires equipment Dressing/Bathing Management: has shower chair Doing Errands Independently Difficulty (such as shopping) (CP): yes Errands Management: pt does not drive, has 2-3 friends that provide transportation   Caregiver Assessment  Primary Source of Support/Comfort: no one Name of Support/Comfort Primary Source: no one particular person People in Home: alone   Planned Interventions  Provider established cholesterol goals reviewed; Counseled on importance of regular laboratory monitoring as prescribed; Provided HLD educational materials; Reviewed importance of limiting foods  high in cholesterol; Reviewed exercise goals and target of 150 minutes per week; Screening for signs and symptoms of depression related to chronic disease state;  Assessed social determinant of health barriers;  Evaluation of current treatment plan related to hypertension self management and patient's adherence to plan as  established by provider;   Reviewed prescribed diet low sodium Reviewed medications with patient and discussed importance of compliance;  Counseled on the importance of exercise goals with target of 150 minutes per week Discussed plans with patient for ongoing care management follow up and provided patient with direct contact information for care management team; Advised patient, providing education and rationale, to monitor blood pressure daily and record, calling PCP for findings outside established parameters;  Discussed complications of poorly controlled blood pressure such as heart disease, stroke, circulatory complications, vision complications, kidney impairment, sexual dysfunction;  Screening for signs and symptoms of depression related to chronic disease state;  Assessed social determinant of health barriers;  Reviewed safety precautions Encouraged pt to have advanced directives notarized   Interaction and coordination with outside resources, practitioners, and providers See CCM Referral  Care Plan: Printed and mailed to patient

## 2022-12-15 NOTE — Addendum Note (Signed)
Addended by: Danford Bad D on: 12/15/2022 03:23 PM   Modules accepted: Orders

## 2022-12-16 ENCOUNTER — Telehealth: Payer: Self-pay

## 2022-12-16 NOTE — Telephone Encounter (Signed)
   Telephone encounter was:  Successful.  12/16/2022 Name: Samantha Mcfarland MRN: [id number] DOB: [birth year]  Samantha Mcfarland is a 67 y.o. year old female who is a primary care patient of Rakes, Doralee Albino, FNP . The community resource team was consulted for assistance with Transportation Needs  and Food Insecurity  Care guide performed the following interventions: Patient provided with information about care guide support team and interviewed to confirm resource needs. Patient has transportation benefits with Medicaid and no food needs at this time   Follow Up Plan:  No further follow up planned at this time. The patient has been provided with needed resources.    Lenard Forth [facility] Guide, [facility] [phone] [address] [place], [place], [place] [address] Phone: [phone] Email: [email or web address]

## 2022-12-19 DIAGNOSIS — I10 Essential (primary) hypertension: Secondary | ICD-10-CM

## 2022-12-19 DIAGNOSIS — E785 Hyperlipidemia, unspecified: Secondary | ICD-10-CM

## 2022-12-21 ENCOUNTER — Encounter: Payer: Self-pay | Admitting: Family Medicine

## 2022-12-21 ENCOUNTER — Other Ambulatory Visit: Payer: Self-pay | Admitting: Family Medicine

## 2022-12-21 ENCOUNTER — Telehealth: Payer: Self-pay | Admitting: Family Medicine

## 2022-12-21 DIAGNOSIS — E782 Mixed hyperlipidemia: Secondary | ICD-10-CM

## 2022-12-21 NOTE — Patient Instructions (Signed)
Visit Information  Thank you for taking time to visit with me today. Please don't hesitate to contact me if I can be of assistance to you.   Following are the goals we discussed today:   Goals Addressed               This Visit's Progress     Patient Stated     COMPLETED: Improve or manage memory [facility]) (pt-stated)        Care Coordination Interventions: Patient interviewed about adult health maintenance status including  memory changes Discussed plans with patient for ongoing care management follow up and provided patient with direct contact information for care management team Discussed transfer to [facility] CCM services. Discussed neurology services      COMPLETED: manage back pain ([facility]) (pt-stated)        Care Coordination Interventions: Counseled on the importance of reporting any/all new or changed pain symptoms or management strategies to pain management provider Discussed plans with patient for ongoing care management follow up and provided patient with direct contact information for care management team Transferred to [facility] CCM services       COMPLETED: Manage hypertension ([facility]) (pt-stated)        Care Coordination Interventions: Discussed plans with patient for ongoing care management follow up and provided patient with direct contact information for care management team Transferring to [facility] CCM services       COMPLETED: Manage hyponatremia/liver lesion [facility]) (pt-stated)        Care Coordination Interventions: Discussed plans with patient for ongoing care management follow up and provided patient with direct contact information for care management team Transferred to [facility] CCM        COMPLETED: Manage patient reported balance and chemical imbalance (chemical imbalance/etoh/cigarette) (pt-stated)        Care Coordination Interventions: Advised patient to discuss her concerns with physical and chemical imbalance with provider Patient interviewed about adult health maintenance  status including  her physical and chemical imbalance Confirms she reports previously working with Dr Samantha Mcfarland, neurologist "years ago" RN CM unable to find notes for Dr Samantha Mcfarland nor any neurology notes in [facility]  Advised patient to discuss  her "bad nerves" with primary care provider  Confirmed with patient that she has decreased her intake of alcohol but continues to smoke  Discussed transfer to [facility] CCM services        Other     COMPLETED: Right upper extremity cyst removal [facility])        Interventions Today    Flowsheet Row Most Recent Value  Chronic Disease   Chronic disease during today's visit Other  [gangalion cyst removal, pending orthopedic referral, reviewed FYI sent to pcp from 07/29/22 outreach ( neurology consult, chemical imbalance+), A plan for treating allergy/respiratory symptoms at home. She didn't discuss with pcp 3/5 All questions answered]  General Interventions   General Interventions Discussed/Reviewed General Interventions Discussed, [facility], Doctor Visits, Communication with  [discussed the use of her U card benefit to assist with neti pot/navage/saline spray, reviewed the FYI message sent to pcp 08/25/22 via EPIC about concerns from 07/29/22 she reports she forgot on 08/24/22]  Doctor Visits Discussed/Reviewed Doctor Visits Discussed, Doctor Visits Reviewed, PCP, Specialist  [Discussed the pending ortho referral, the process of removing a gangalion cyst, answered questions]  PCP/Specialist Visits Compliance with follow-up visit, Contact provider for referral to  [facility] provider for referral to Specialist  [patient voices interest in a referral to a neurologist]  Communication with PCP/Specialists  Exercise Interventions   Exercise Discussed/Reviewed Physical Activity, Exercise Reviewed  [remains active with walking her dog and lawn care]  Physical Activity Discussed/Reviewed Physical Activity Reviewed, Home Exercise Program (HEP)  Education Interventions   Education  Provided --  [on g cyst removal & after care, allergen triggers (for her trees, her dog, pollen, ? dust)]  Provided Verbal Education On Medication, Other, Development worker, community, Scientist, physiological for home management of respiratory symptoms to include over the counter saline spray, flonase, zyrtec, allegra, neti pot, navage, frequent nare cleaning, masks when outside or in contact with others- U Card purchases, Not using benadryl.]  Pharmacy Interventions   Pharmacy Dicussed/Reviewed --  [Discussed with her that most allergy/sinus medicines are now over the counter in her pharmacy/stores]  Safety Interventions   Safety Discussed/Reviewed Safety Reviewed, Home Safety  Home Safety Assistive Devices      Transfer to [facility] CCM services         Our next appointment is  NA   on NA at NA  Please call the care guide team at [phone] if you need to cancel or reschedule your appointment.   If you are experiencing a Mental Health or Behavioral Health Crisis or need someone to talk to, please call the Suicide and Crisis Lifeline: 988 call the [place] National Suicide Prevention Lifeline: (310) 480-4038 or TTY: (289)472-7143 TTY (510) 608-1807) to talk to a trained counselor call 1-800-273-TALK (toll free, 24 hour hotline) call the [facility]: [phone] call 911   NA  The patient has been provided with contact information for the care management team and has been advised to call with any health related questions or concerns.   Samantha Mcfarland L. Noelle Penner, RN, BSN, CCM [facility] Care Management Community Coordinator Office number [phone]

## 2022-12-21 NOTE — Telephone Encounter (Signed)
Rakes pt NTBS 30-d given 11/17/22

## 2022-12-21 NOTE — Telephone Encounter (Signed)
I called pt & LMTCB to make an appt w/Rakes for med refill on her b/p meds. Also, I sent pt a letter to remind of this.

## 2022-12-22 MED ORDER — ATORVASTATIN CALCIUM 20 MG PO TABS
20.0000 mg | ORAL_TABLET | Freq: Every day | ORAL | 0 refills | Status: AC
Start: 2022-12-22 — End: ?

## 2022-12-22 NOTE — Addendum Note (Signed)
Addended by: Cleda Daub on: 12/22/2022 10:25 AM   Modules accepted: Orders

## 2022-12-22 NOTE — Telephone Encounter (Signed)
Please advise, can pt do a video visit if she is able to

## 2022-12-22 NOTE — Telephone Encounter (Signed)
This can be refilled.

## 2022-12-22 NOTE — Telephone Encounter (Signed)
Rx sent to [facility] pharmacy per PCP

## 2022-12-28 ENCOUNTER — Ambulatory Visit: Payer: Self-pay | Admitting: *Deleted

## 2022-12-29 ENCOUNTER — Encounter: Payer: Self-pay | Admitting: *Deleted

## 2022-12-29 NOTE — Patient Outreach (Signed)
Care Coordination   Follow Up Visit Note   12/29/2022  Name: Samantha Mcfarland MRN: [id number] DOB: [birth year]  Samantha Mcfarland is a 67 y.o. year old female who sees Rakes, Doralee Albino, FNP for primary care. I spoke with [name] by phone today.  What matters to the patients health and wellness today?  Find Help in My Community.   Goals Addressed               This Visit's Progress     Find Help in My Community. (pt-stated)   On track     Care Coordination Interventions:  Interventions Today    Flowsheet Row Most Recent Value  Chronic Disease   Chronic disease during today's visit Hypertension (HTN), Other  [Food Insecurity, Financial Difficulties, Lack of Reliable Transportation, Generalized Anxiety Disorder, Limited Social & Family Support, Moderate, Recurrent Major Depression]  General Interventions   General Interventions Discussed/Reviewed General Interventions Discussed, Labs, Vaccines, Doctor Visits, General Interventions Reviewed, Annual Eye Exam, Durable Medical Equipment (DME), [facility], Level of Care, Communication with, Annual Foot Exam, Health Screening, Lipid Profile  [Encouraged]  Labs Kidney Function, Hgb A1c annually  [Encouraged]  Vaccines COVID-19, Flu, Pneumonia, RSV, Shingles, Tetanus/Pertussis/Diphtheria  [Encouraged]  Doctor Visits Discussed/Reviewed Doctor Visits Discussed, Specialist, Doctor Visits Reviewed, Annual Wellness Visits, PCP  [Encouraged]  Health Screening Bone Density, Colonoscopy, Mammogram  [Encouraged]  Durable Medical Equipment (DME) Walker, BP Cuff, Other  [Glasses]  PCP/Specialist Visits Compliance with follow-up visit  [Encouraged]  Communication with PCP/Specialists, RN  [Encouraged]  Level of Care Adult Daycare, Applications, Assisted Living, Personal Care Services  [Encouraged]  Applications Personal Care Services, FL-2  [Encouraged]  Exercise Interventions   Exercise Discussed/Reviewed Exercise Discussed, Assistive device use and  maintanence, Exercise Reviewed, Physical Activity, Weight Managment  [Encouraged]  Physical Activity Discussed/Reviewed Physical Activity Discussed, Home Exercise Program (HEP), Physical Activity Reviewed, Types of exercise, Gym  [Encouraged]  Weight Management Weight maintenance  [Encouraged]  Education Interventions   Education Provided Provided Therapist, sports, Provided Web-based Education, Provided Education  [Encouraged]  Provided Verbal Education On Nutrition, Mental Health/Coping with Illness, When to see the doctor, Foot Care, Eye Care, Applications, Exercise, Labs, Medication, Development worker, community, [employer] Resources  [Encouraged]  Labs Reviewed Kidney Function  [Encouraged]  Applications Personal Care Services, FL-2  [Encouraged]  Mental Health Interventions   Mental Health Discussed/Reviewed Mental Health Discussed, Anxiety, Mental Health Reviewed, Depression, Grief and Loss, Coping Strategies, Crisis, Suicide, Substance Abuse, Other  [Domestic Violence]  Nutrition Interventions   Nutrition Discussed/Reviewed Nutrition Discussed, Increasing proteins, Adding fruits and vegetables, Decreasing fats, Nutrition Reviewed, Fluid intake, Carbohydrate meal planning, Portion sizes, Decreasing sugar intake, Supplemental nutrition, Decreasing salt  [Encouraged]  Pharmacy Interventions   Pharmacy Dicussed/Reviewed Pharmacy Topics Discussed, Medications and their functions, Medication Adherence, Pharmacy Topics Reviewed, Affording Medications  [Encouraged]  Safety Interventions   Safety Discussed/Reviewed Safety Discussed, Safety Reviewed, Fall Risk, Home Safety  [Encouraged]  Home Safety Assistive Devices, Need for home safety assessment, Refer for community resources  [Encouraged]  Advanced Directive Interventions   Advanced Directives Discussed/Reviewed Advanced Directives Discussed  [Encouraged]     Active Listening & Reflection Utilized.  Verbalization of Feelings Encouraged.  Emotional  Support Provided. Feelings of Loss of Independence Validated. Symptoms of Anxiety & Depression Acknowledged. Problem Solving Interventions Identified. Task-Centered Solutions Implemented.   Solution-Focused Strategies Developed. Acceptance & Commitment Therapy Introduced. Cognitive Behavioral Therapy Initiated. Client-Centered Therapy Enacted. Deep Breathing Exercises, Relaxation Techniques, & Mindfulness Meditation Strategies Reviewed & Implementation Encouraged Daily. Encouraged  Careers information officer with Psychiatrist of Interest in [facility], for Psychotropic Medication Administration & Management, From List Provided. Encouraged Careers information officer with Therapist of Interest in [facility], for Psychotherapeutic Counseling & Supportive Services, From List Provided. Emphasized Importance of Quitting Smoking & Offered Smoking Cessation Classes, Services, Agencies, & Resources, & Agreed to Assist with Referral Process. Confirmed Receipt & Thoroughly Reviewed The Following List of [name], Nurse, adult, Resources, Occupational psychologist, to [employer] & Entertain Questions: ~ Neurosurgeon in [place] ~ Engineer, drilling ~ TEFL teacher ~ Low Income Chief Technology Officer ~ [place] [facility] Program Flyer ~ [employer] Support & Nutrition Program Referral Form ~ Armed forces logistics/support/administrative officer, Stage manager, & Soup Kitchens in [place] ~ Merchant navy officer ~ Theatre manager Providers ~ In-Home Care & Respite Agencies ~ Home Health Care Agencies ~ Respite Care Agencies & Facilities ~ Armed forces training and education officer Instructions Encouraged Careers information officer with [employer], Services, & Resources of Interest, in An Effort to [employer]. Encouraged Careers information officer with Food Banks, Food Pantries, & Soup Kitchens of Interest, in An Effort to [employer] &  Nutritional Assistance. Encouraged Completion of Application for Personal Care Services & Submission to Primary Care Provider, Family Nurse Practitioner, [name] with [facility] [phone]), for Review, Signature, & Submission to [facility] [phone]), for Processing. Encouraged Completion of Application for [facility] [employer] & Submission to Aging, Disability, & Transit Services of [place] [phone]), for Processing. Reminded of Transportation Benefits through [employer] Dual [employer], [place] (# [phone]) & [employer] [phone]).  CSW Collaboration with Representative from Aging, Disability, & Transit Services of [place] [phone]), to Confirm Name on Waiting List to [employer] on Wheels. Encouraged Careers information officer with CSW (# [phone]), if You Have Questions, Need Assistance, or If Additional Social Work Needs Are Identified Between Now & Our Next Scheduled Follow-Up Outreach Call.      SDOH assessments and interventions completed:  Yes.  Care Coordination Interventions:  Yes, provided.   Follow up plan: Follow up call scheduled for 01/10/2023 at 3:15 pm.   Encounter Outcome:  Pt. Visit Completed.   Danford Bad, BSW, MSW, LCSW  Licensed Restaurant manager, fast food [facility]  Mailing [place] [address], [place], [place] [address] Physical Address-[address] [place], [place] [address] Toll Free Main # [phone] Fax # [phone] Cell # [phone] [email or web address]
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
## 2023-01-10 ENCOUNTER — Ambulatory Visit: Payer: Self-pay | Admitting: *Deleted

## 2023-01-10 ENCOUNTER — Encounter: Payer: Self-pay | Admitting: *Deleted
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
## 2023-02-02 ENCOUNTER — Telehealth: Payer: 59

## 2023-02-02 ENCOUNTER — Encounter: Payer: Self-pay | Admitting: *Deleted

## 2023-02-02 ENCOUNTER — Other Ambulatory Visit: Payer: 59 | Admitting: *Deleted

## 2023-02-02 NOTE — Patient Instructions (Signed)
Visit Information  Thank you for taking time to visit with me today. Please don't hesitate to contact me if I can be of assistance to you before our next scheduled telephone appointment.  Following are the goals we discussed today:   Goals Addressed             This Visit's Progress    CCM (HYPERLIPIDEMIA) EXPECTED OUTCOME: MONITOR, SELF-MANAGE AND REDUCE SYMPTOMS OF HYPERLIPIDEMIA       Current Barriers:  Knowledge Deficits related to Hyperlipidemia management Chronic Disease Management support and education needs related to Hyperlipidemia, diet Financial Constraints.  Patient reports she has all medications and taking as prescribed, no new concerns reported  Planned Interventions: Provider established cholesterol goals reviewed; Counseled on importance of regular laboratory monitoring as prescribed; Reviewed importance of limiting foods high in cholesterol; Reviewed exercise goals and target of 150 minutes per week; Reinforced heart healthy diet  Symptom Management: Take medications as prescribed   Attend all scheduled provider appointments Call pharmacy for medication refills 3-7 days in advance of running out of medications Attend church or other social activities Call provider office for new concerns or questions  Work with the social worker to address care coordination needs and will continue to work with the clinical team to address health care and disease management related needs - call doctor with any symptoms you believe are related to your medicine - call doctor when you experience any new symptoms - go to all doctor appointments as scheduled - adhere to prescribed diet: heart healthy - develop an exercise routine Follow heart healthy diet Bake or broil foods instead of frying  Follow Up Plan: Telephone follow up appointment with care management team member scheduled for:  04/22/23 at 945 am       CCM (HYPERTENSION) EXPECTED OUTCOME: SELF-MANAGE AND REDUCE  SYMPTOMS OF HYPERTENSION       Current Barriers:  Knowledge Deficits related to Hypertension management Care Coordination needs related to social work/ financial needs in a patient with Hypertension Chronic Disease Management support and education needs related to Hypertension, diet Lacks caregiver support.  Corporate treasurer.  No Advanced Directives in place- pt reports she is in process of completing, needs to have notarized Patient reports she lives alone, has a few friends she can call on if needed and they provide transportation, pt reports she uses a walker most of the time, is high risk for falls Patient reports she does not monitor blood pressure, states she will check into getting a blood pressure cuff  Planned Interventions: Evaluation of current treatment plan related to hypertension self management and patient's adherence to plan as established by provider;   Reviewed medications with patient and discussed importance of compliance;  Counseled on the importance of exercise goals with target of 150 minutes per week Discussed plans with patient for ongoing care management follow up and provided patient with direct contact information for care management team; Advised patient, providing education and rationale, to monitor blood pressure daily and record, calling PCP for findings outside established parameters;  Discussed complications of poorly controlled blood pressure such as heart disease, stroke, circulatory complications, vision complications, kidney impairment, sexual dysfunction;  Reinforced safety precautions Encouraged pt to have advanced directives notarized  Symptom Management: Take medications as prescribed   Attend all scheduled provider appointments Call pharmacy for medication refills 3-7 days in advance of running out of medications Attend church or other social activities Call provider office for new concerns or questions  Work with the  social worker to address  care coordination needs and will continue to work with the clinical team to address health care and disease management related needs check blood pressure weekly choose a place to take my blood pressure (home, clinic or office, retail store) write blood pressure results in a log or diary learn about high blood pressure keep a blood pressure log take blood pressure log to all doctor appointments keep all doctor appointments take medications for blood pressure exactly as prescribed report new symptoms to your doctor eat more whole grains, fruits and vegetables, lean meats and healthy fats Have advanced directives notarized fall prevention strategies: q position slowly, use assistive device such as walker or cane (per provider recommendations) when walking, keep walkways clear, have good lighting in room. It is important to contact your provider if you have any falls, maintain muscle strength/tone by exercise per provider recommendations.  Follow Up Plan: Telephone outreach scheduled for  04/22/23 at 945 am           Our next appointment is by telephone on 04/22/23 at 945 am  Please call the care guide team at [phone] if you need to cancel or reschedule your appointment.   If you are experiencing a Mental Health or Behavioral Health Crisis or need someone to talk to, please call the Suicide and Crisis Lifeline: 988 call the [place] National Suicide Prevention Lifeline: 305-468-2571 or TTY: (986) 865-9300 TTY (337) 129-5672) to talk to a trained counselor call 1-800-273-TALK (toll free, 24 hour hotline) go to [facility] [address], [place] [phone]) call the [facility] Crisis Line: [phone] call 911   Patient verbalizes understanding of instructions and care plan provided today and agrees to view in MyChart. Active MyChart status and patient understanding of how to access instructions and care plan via MyChart confirmed with  patient.     Telephone follow up appointment with care management team member scheduled for: 04/22/23 at 945 am  Irving Shows [facility], BSN [place]/ Ambulatory Care Management [phone]

## 2023-02-02 NOTE — Patient Outreach (Signed)
Care Management   Visit Note  02/02/2023 Name: Samantha Mcfarland MRN: [id number] DOB: 1955-06-30  Subjective: Samantha Mcfarland is a 67 y.o. year old female who is a primary care patient of Rakes, Doralee Albino, FNP. The Care Management team was consulted for assistance.      Engaged with patient spoke with patient by telephone for follow up   Goals Addressed             This Visit's Progress    CCM (HYPERLIPIDEMIA) EXPECTED OUTCOME: MONITOR, SELF-MANAGE AND REDUCE SYMPTOMS OF HYPERLIPIDEMIA       Current Barriers:  Knowledge Deficits related to Hyperlipidemia management Chronic Disease Management support and education needs related to Hyperlipidemia, diet Financial Constraints.  Patient reports she has all medications and taking as prescribed, no new concerns reported  Planned Interventions: Provider established cholesterol goals reviewed; Counseled on importance of regular laboratory monitoring as prescribed; Reviewed importance of limiting foods high in cholesterol; Reviewed exercise goals and target of 150 minutes per week; Reinforced heart healthy diet  Symptom Management: Take medications as prescribed   Attend all scheduled provider appointments Call pharmacy for medication refills 3-7 days in advance of running out of medications Attend church or other social activities Call provider office for new concerns or questions  Work with the social worker to address care coordination needs and will continue to work with the clinical team to address health care and disease management related needs - call doctor with any symptoms you believe are related to your medicine - call doctor when you experience any new symptoms - go to all doctor appointments as scheduled - adhere to prescribed diet: heart healthy - develop an exercise routine Follow heart healthy diet Bake or broil foods instead of frying  Follow Up Plan: Telephone follow up appointment with care management team member scheduled  for:  04/22/23 at 945 am       CCM (HYPERTENSION) EXPECTED OUTCOME: SELF-MANAGE AND REDUCE SYMPTOMS OF HYPERTENSION       Current Barriers:  Knowledge Deficits related to Hypertension management Care Coordination needs related to social work/ financial needs in a patient with Hypertension Chronic Disease Management support and education needs related to Hypertension, diet Lacks caregiver support.  Corporate treasurer.  No Advanced Directives in place- pt reports she is in process of completing, needs to have notarized Patient reports she lives alone, has a few friends she can call on if needed and they provide transportation, pt reports she uses a walker most of the time, is high risk for falls Patient reports she does not monitor blood pressure, states she will check into getting a blood pressure cuff  Planned Interventions: Evaluation of current treatment plan related to hypertension self management and patient's adherence to plan as established by provider;   Reviewed medications with patient and discussed importance of compliance;  Counseled on the importance of exercise goals with target of 150 minutes per week Discussed plans with patient for ongoing care management follow up and provided patient with direct contact information for care management team; Advised patient, providing education and rationale, to monitor blood pressure daily and record, calling PCP for findings outside established parameters;  Discussed complications of poorly controlled blood pressure such as heart disease, stroke, circulatory complications, vision complications, kidney impairment, sexual dysfunction;  Reinforced safety precautions Encouraged pt to have advanced directives notarized  Symptom Management: Take medications as prescribed   Attend all scheduled provider appointments Call pharmacy for medication refills 3-7 days in advance of running out  of medications Attend church or other social  activities Call provider office for new concerns or questions  Work with the social worker to address care coordination needs and will continue to work with the clinical team to address health care and disease management related needs check blood pressure weekly choose a place to take my blood pressure (home, clinic or office, retail store) write blood pressure results in a log or diary learn about high blood pressure keep a blood pressure log take blood pressure log to all doctor appointments keep all doctor appointments take medications for blood pressure exactly as prescribed report new symptoms to your doctor eat more whole grains, fruits and vegetables, lean meats and healthy fats Have advanced directives notarized fall prevention strategies: q position slowly, use assistive device such as walker or cane (per provider recommendations) when walking, keep walkways clear, have good lighting in room. It is important to contact your provider if you have any falls, maintain muscle strength/tone by exercise per provider recommendations.  Follow Up Plan: Telephone outreach scheduled for  04/22/23 at 945 am           Plan: Telephone follow up appointment with care management team member scheduled for:  04/22/23 at 945 am  Irving Shows [facility], BSN [place]/ Ambulatory Care Management [phone]

## 2023-02-03 ENCOUNTER — Telehealth: Payer: Self-pay | Admitting: Family Medicine

## 2023-02-03 NOTE — Telephone Encounter (Signed)
Form typed up and forwarded to PCP

## 2023-02-03 NOTE — Telephone Encounter (Signed)
Wants PCP to resubmit her PCS papers for help.

## 2023-02-04 ENCOUNTER — Other Ambulatory Visit (HOSPITAL_COMMUNITY)
Admission: RE | Admit: 2023-02-04 | Discharge: 2023-02-04 | Disposition: A | Discharge status: Home / Self Care | Payer: 59 | Source: Ambulatory Visit | Attending: Nephrology | Admitting: Nephrology

## 2023-02-04 DIAGNOSIS — D631 Anemia in chronic kidney disease: Secondary | ICD-10-CM | POA: Insufficient documentation

## 2023-02-04 DIAGNOSIS — I129 Hypertensive chronic kidney disease with stage 1 through stage 4 chronic kidney disease, or unspecified chronic kidney disease: Secondary | ICD-10-CM | POA: Insufficient documentation

## 2023-02-04 DIAGNOSIS — R809 Proteinuria, unspecified: Secondary | ICD-10-CM | POA: Insufficient documentation

## 2023-02-04 DIAGNOSIS — N1831 Chronic kidney disease, stage 3a: Secondary | ICD-10-CM | POA: Diagnosis not present

## 2023-02-04 LAB — CBC
HCT: 33 % — ABNORMAL LOW (ref 36.0–46.0)
Hemoglobin: 10.8 g/dL — ABNORMAL LOW (ref 12.0–15.0)
MCH: 31.7 pg (ref 26.0–34.0)
MCHC: 32.7 g/dL (ref 30.0–36.0)
MCV: 96.8 fL (ref 80.0–100.0)
Platelets: 252 10*3/uL (ref 150–400)
RBC: 3.41 MIL/uL — ABNORMAL LOW (ref 3.87–5.11)
RDW: 13.2 % (ref 11.5–15.5)
WBC: 4.7 10*3/uL (ref 4.0–10.5)
nRBC: 0 % (ref 0.0–0.2)

## 2023-02-04 LAB — RENAL FUNCTION PANEL
Albumin: 4 g/dL (ref 3.5–5.0)
Anion gap: 6 (ref 5–15)
BUN: 15 mg/dL (ref 8–23)
CO2: 27 mmol/L (ref 22–32)
Calcium: 9.1 mg/dL (ref 8.9–10.3)
Chloride: 103 mmol/L (ref 98–111)
Creatinine, Ser: 0.74 mg/dL (ref 0.44–1.00)
GFR, Estimated: >60 mL/min (ref >60)
Glucose, Bld: 83 mg/dL (ref 70–99)
Phosphorus: 4.6 mg/dL (ref 2.5–4.6)
Potassium: 4.1 mmol/L (ref 3.5–5.1)
Sodium: 136 mmol/L (ref 135–145)

## 2023-02-04 LAB — PROTEIN / CREATININE RATIO, URINE
Creatinine, Urine: 148 mg/dL
Protein Creatinine Ratio: 0.21 mg/mg{Cre} — ABNORMAL HIGH (ref 0.00–0.15)
Total Protein, Urine: 31 mg/dL

## 2023-02-04 NOTE — Telephone Encounter (Signed)
Aware new PCS form faxed to [place] [facility]

## 2023-02-08 ENCOUNTER — Ambulatory Visit: Payer: Self-pay | Admitting: *Deleted

## 2023-02-08 ENCOUNTER — Encounter: Payer: Self-pay | Admitting: *Deleted
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
## 2023-02-11 DIAGNOSIS — E871 Hypo-osmolality and hyponatremia: Secondary | ICD-10-CM | POA: Diagnosis not present

## 2023-02-11 DIAGNOSIS — R809 Proteinuria, unspecified: Secondary | ICD-10-CM | POA: Diagnosis not present

## 2023-02-11 DIAGNOSIS — N182 Chronic kidney disease, stage 2 (mild): Secondary | ICD-10-CM | POA: Diagnosis not present

## 2023-02-11 DIAGNOSIS — I10 Essential (primary) hypertension: Secondary | ICD-10-CM | POA: Diagnosis not present

## 2023-02-24 ENCOUNTER — Ambulatory Visit: Payer: Self-pay | Admitting: *Deleted

## 2023-02-24 ENCOUNTER — Encounter: Payer: Self-pay | Admitting: *Deleted
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
## 2023-02-28 ENCOUNTER — Ambulatory Visit (HOSPITAL_COMMUNITY): Payer: 59

## 2023-03-15 ENCOUNTER — Ambulatory Visit: Payer: Self-pay | Admitting: *Deleted
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
## 2023-03-18 ENCOUNTER — Other Ambulatory Visit: Payer: Self-pay | Admitting: Hematology

## 2023-03-22 ENCOUNTER — Other Ambulatory Visit: Payer: Self-pay | Admitting: Family Medicine

## 2023-03-22 DIAGNOSIS — E782 Mixed hyperlipidemia: Secondary | ICD-10-CM

## 2023-03-30 ENCOUNTER — Ambulatory Visit: Payer: Self-pay | Admitting: *Deleted

## 2023-03-30 NOTE — Patient Instructions (Signed)
Visit Information  Thank you for taking time to visit with me today. Please don't hesitate to contact me if I can be of assistance to you.   Following are the goals we discussed today:   Goals Addressed               This Visit's Progress     Find Help in My Community. (pt-stated)   On track     Care Coordination Interventions:  Interventions Today    Flowsheet Row Most Recent Value  Chronic Disease   Chronic disease during today's visit Other  [Balance Concerns, Anxiety, Chemotherapy from Breast Cancer]  General Interventions   General Interventions Discussed/Reviewed General Interventions Discussed, General Interventions Reviewed, Doctor Visits  [Encouraged Routine Follow-Up with All Providers]  Doctor Visits Discussed/Reviewed Doctor Visits Discussed, Doctor Visits Reviewed, PCP, Specialist  [Encouraged Attendance at Physician Appointments & Provider Practice Visits]  Health Screening Mammogram  [Encouraged]  Communication with PCP/Specialists  [facility] Primary Care Provider of Plans to Receive Services through a New Primary Care Provider]  Education Interventions   Education Provided Provided Education  [employer] Resources & Support Programs]  Provided Verbal Education On Mental Health/Coping with Illness, Community Resources  [Encouraged Implementation of Positive Coping Mechanisms]  Mental Health Interventions   Mental Health Discussed/Reviewed Mental Health Discussed, Anxiety, Depression, Grief and Loss, Mental Health Reviewed, Substance Abuse, Coping Strategies, Suicide, Crisis  [Reviewed Resources & Encouraged Engagement]  Safety Interventions   Safety Discussed/Reviewed Safety Discussed, Safety Reviewed, Fall Risk  [Encouraged Use of Assistive Devices While Standing & Ambulating]  Advanced Directive Interventions   Advanced Directives Discussed/Reviewed Advanced Directives Discussed, Advanced Directives Reviewed  [Encouraged Completion]      Active Listening &  Reflection Utilized.  Verbalization of Feelings Encouraged.  Emotional Support Provided. Problem Solving Interventions Implemented.  Solution-Focused Strategies Employed. Cognitive Behavioral Therapy Initiated. Client-Centered Therapy Performed. Encouraged Engagement with Personal Care Service Provider, through [facility] (# [phone]), Funded by Medicaid & [employer] [phone]), Monday through Friday, From 8:00 AM until 10:00 AM, to Receive Assistance with Bathing, Dressing, Meal Preparation, Light Housekeeping Duties, Laundry, [employer], [employer], Transportation, Catering manager. Encouraged Routine Engagement in Activities of Interest, Inside & Outside the Home. Encouraged Administration of Medications, Exactly as Prescribed. Encouraged Increased Level of Activity & Exercise, as Tolerated. Encouraged Daily Implementation of Deep Breathing Exercises, Relaxation Techniques, & Mindfulness Meditation Strategies, in An Effort to Reduce & Manage Symptoms of Anxiety & Depression. Encouraged Journaling, As of Means of Expressing Thoughts, Feelings, & Emotions. Encouraged Engagement with [name], Nurse, adult, Field seismologist of Interest in [place], Offering Affordable & Reduced Futures trader, from List Provided. Encouraged Engagement with Representative from Aging, Disability, & Transit Services of [place] [phone]), to Check Status of Name on Waiting List to Receive Prepared Meal Delivery Services through Meals on Wheels. Encouraged Engagement with Family Nurse Practitioner, [name], Primary Care Provider with [facility] [phone]), to Report Change in Primary Care Providers. Encouraged Engagement with Family Nurse Practitioner, [name], Primary Care Provider with [facility] [phone]), to [facility] Visit,  Scheduled on 05/26/2023 at 8:00 AM. Encouraged Attendance at Initial Appointment with Dr. [name], Primary Care Provider with [facility] [phone]), Scheduled on 04/12/2023 at 9:15 AM. Encouraged Attendance at Follow-Up Appointment with Phlebotomy at [facility] at [facility] [phone]), Scheduled  on 04/13/2023 at 11:00 AM. Encouraged Attendance at Follow-Up Appointment with Dr. Doreatha Massed, Oncologist/Hematologist with [facility] at [facility] [phone]), Scheduled on 04/20/2023 at 11:45 AM.  Encouraged Engagement with Irving Shows, Nurse Case Manager with [facility] [phone]), During Follow-Up Outreach Call, Scheduled on 04/22/2023 at 9:45 AM. Encouraged Engagement with Danford Bad, Social Work Case Manager with [facility] [phone]), if You Have Questions, Need Assistance, or If Additional Social Work Needs Are Identified Between Now & Our Next Follow-Up Outreach Call, Scheduled on 04/29/2023 at 9:45 AM.      Our next appointment is by telephone on 04/29/2023 at 9:45 am.  Please call the care guide team at [phone] if you need to cancel or reschedule your appointment.   If you are experiencing a Mental Health or Behavioral Health Crisis or need someone to talk to, please call the Suicide and Crisis Lifeline: 988 call the [place] National Suicide Prevention Lifeline: 563 265 6438 or TTY: (754)311-9109 TTY 305-508-8109) to talk to a trained counselor call 1-800-273-TALK (toll free, 24 hour hotline) go to [facility] Urgent Care [address], [place] [phone]) call the [facility] Crisis Line: [phone] call 911  Patient verbalizes understanding of instructions and care plan provided today and agrees to view in MyChart. Active MyChart status and patient understanding of how to access  instructions and care plan via MyChart confirmed with patient.     Telephone follow up appointment with care management team member scheduled for:  04/29/2023 at 9:45 am.  Danford Bad, BSW, MSW, LCSW  Embedded Practice Social Work Case Manager  [facility], Population Health Direct Dial: [phone]  Fax: [phone] Email: [email or web address] Website: [email or web address]

## 2023-03-30 NOTE — Patient Outreach (Signed)
Care Coordination   Follow Up Visit Note   03/30/2023  Name: Samantha Mcfarland MRN: [id number] DOB: 1955-07-07  Samantha Mcfarland is a 67 y.o. year old female who sees Rakes, Doralee Albino, FNP for primary care. I spoke with [name] by phone today.  What matters to the patients health and wellness today?  Find Help in My Community.    Goals Addressed               This Visit's Progress     Find Help in My Community. (pt-stated)   On track     Care Coordination Interventions:  Interventions Today    Flowsheet Row Most Recent Value  Chronic Disease   Chronic disease during today's visit Other  [Balance Concerns, Anxiety, Chemotherapy from Breast Cancer]  General Interventions   General Interventions Discussed/Reviewed General Interventions Discussed, General Interventions Reviewed, Doctor Visits  [Encouraged Routine Follow-Up with All Providers]  Doctor Visits Discussed/Reviewed Doctor Visits Discussed, Doctor Visits Reviewed, PCP, Specialist  [Encouraged Attendance at Physician Appointments & Provider Practice Visits]  Health Screening Mammogram  [Encouraged]  Communication with PCP/Specialists  [facility] Primary Care Provider of Plans to Receive Services through a New Primary Care Provider]  Education Interventions   Education Provided Provided Education  [employer] Resources & Support Programs]  Provided Verbal Education On Mental Health/Coping with Illness, Community Resources  [Encouraged Implementation of Positive Coping Mechanisms]  Mental Health Interventions   Mental Health Discussed/Reviewed Mental Health Discussed, Anxiety, Depression, Grief and Loss, Mental Health Reviewed, Substance Abuse, Coping Strategies, Suicide, Crisis  [Reviewed Resources & Encouraged Engagement]  Safety Interventions   Safety Discussed/Reviewed Safety Discussed, Safety Reviewed, Fall Risk  [Encouraged Use of Assistive Devices While Standing & Ambulating]  Advanced Directive Interventions   Advanced Directives  Discussed/Reviewed Advanced Directives Discussed, Advanced Directives Reviewed  [Encouraged Completion]      Active Listening & Reflection Utilized.  Verbalization of Feelings Encouraged.  Emotional Support Provided. Problem Solving Interventions Implemented.  Solution-Focused Strategies Employed. Cognitive Behavioral Therapy Initiated. Client-Centered Therapy Performed. Encouraged Engagement with Personal Care Service Provider, through [facility] (# [phone]), Funded by Medicaid & [employer] [phone]), Monday through Friday, From 8:00 AM until 10:00 AM, to Receive Assistance with Bathing, Dressing, Meal Preparation, Light Housekeeping Duties, Laundry, [employer], [employer], Transportation, Catering manager. Encouraged Routine Engagement in Activities of Interest, Inside & Outside the Home. Encouraged Administration of Medications, Exactly as Prescribed. Encouraged Increased Level of Activity & Exercise, as Tolerated. Encouraged Daily Implementation of Deep Breathing Exercises, Relaxation Techniques, & Mindfulness Meditation Strategies, in An Effort to Reduce & Manage Symptoms of Anxiety & Depression. Encouraged Journaling, As of Means of Expressing Thoughts, Feelings, & Emotions. Encouraged Engagement with [name], Nurse, adult, Field seismologist of Interest in [place], Offering Affordable & Reduced Futures trader, from List Provided. Encouraged Engagement with Representative from Aging, Disability, [employer] [facility] of [place] [phone]), to Check Status of Name on Waiting List to Receive Prepared Meal Delivery Services through [employer] Wheels. Encouraged Engagement with Family Nurse Practitioner, [name], Primary Care Provider with [facility] [phone]), to Report Change in Primary Care Providers. Encouraged Engagement with Family Nurse Practitioner, [name], Primary Care Provider with [facility] [phone]), to [facility] Visit, Scheduled on 05/26/2023 at 8:00 AM. Encouraged Attendance at Initial Appointment with Dr. [name], Primary Care Provider with [facility] (#  [phone]), Scheduled on 04/12/2023 at 9:15 AM. Encouraged Attendance at Follow-Up Appointment with Phlebotomy at [facility] at [facility] [phone]), Scheduled on 04/13/2023 at 11:00 AM. Encouraged Attendance at Follow-Up Appointment with Dr. Doreatha Massed, Oncologist/Hematologist with [facility] at [facility] [phone]), Scheduled on 04/20/2023 at 11:45 AM.  Encouraged Engagement with Irving Shows, Nurse Case Manager with [facility] [phone]), During Follow-Up Outreach Call, Scheduled on 04/22/2023 at 9:45 AM. Encouraged Engagement with Danford Bad, Social Work Case Manager with [facility] [phone]), if You Have Questions, Need Assistance, or If Additional Social Work Needs Are Identified Between Now & Our Next Follow-Up Outreach Call, Scheduled on 04/29/2023 at 9:45 AM.      SDOH assessments and interventions completed:  Yes.  Care Coordination Interventions:  Yes, provided.   Follow up plan: Follow up call scheduled for 04/29/2023 at 9:45 am.  Encounter Outcome:  Patient Visit Completed.   Danford Bad, BSW, MSW, Printmaker Social Work Case Set designer Health  [facility], Population Health Direct Dial: [phone]  Fax: [phone] Email: [email or web address]

## 2023-04-13 ENCOUNTER — Ambulatory Visit (HOSPITAL_COMMUNITY)
Admission: RE | Admit: 2023-04-13 | Discharge: 2023-04-13 | Disposition: A | Discharge status: Home / Self Care | Payer: 59 | Source: Ambulatory Visit | Attending: Hematology | Admitting: Hematology

## 2023-04-13 ENCOUNTER — Encounter (HOSPITAL_COMMUNITY): Payer: Self-pay

## 2023-04-13 ENCOUNTER — Inpatient Hospital Stay: Payer: 59 | Attending: Hematology

## 2023-04-13 DIAGNOSIS — Z170 Estrogen receptor positive status [ER+]: Secondary | ICD-10-CM | POA: Insufficient documentation

## 2023-04-13 DIAGNOSIS — M8580 Other specified disorders of bone density and structure, unspecified site: Secondary | ICD-10-CM | POA: Insufficient documentation

## 2023-04-13 DIAGNOSIS — D649 Anemia, unspecified: Secondary | ICD-10-CM | POA: Diagnosis not present

## 2023-04-13 DIAGNOSIS — Z1231 Encounter for screening mammogram for malignant neoplasm of breast: Secondary | ICD-10-CM | POA: Diagnosis not present

## 2023-04-13 DIAGNOSIS — C50912 Malignant neoplasm of unspecified site of left female breast: Secondary | ICD-10-CM | POA: Diagnosis not present

## 2023-04-13 DIAGNOSIS — Z79811 Long term (current) use of aromatase inhibitors: Secondary | ICD-10-CM | POA: Insufficient documentation

## 2023-04-13 LAB — CBC WITH DIFFERENTIAL/PLATELET
Abs Immature Granulocytes: 0.01 10*3/uL (ref 0.00–0.07)
Basophils Absolute: 0 10*3/uL (ref 0.0–0.1)
Basophils Relative: 1 %
Eosinophils Absolute: 0.3 10*3/uL (ref 0.0–0.5)
Eosinophils Relative: 5 %
HCT: 34.6 % — ABNORMAL LOW (ref 36.0–46.0)
Hemoglobin: 11.3 g/dL — ABNORMAL LOW (ref 12.0–15.0)
Immature Granulocytes: 0 %
Lymphocytes Relative: 46 %
Lymphs Abs: 2.2 10*3/uL (ref 0.7–4.0)
MCH: 32 pg (ref 26.0–34.0)
MCHC: 32.7 g/dL (ref 30.0–36.0)
MCV: 98 fL (ref 80.0–100.0)
Monocytes Absolute: 0.4 10*3/uL (ref 0.1–1.0)
Monocytes Relative: 8 %
Neutro Abs: 2 10*3/uL (ref 1.7–7.7)
Neutrophils Relative %: 40 %
Platelets: 298 10*3/uL (ref 150–400)
RBC: 3.53 MIL/uL — ABNORMAL LOW (ref 3.87–5.11)
RDW: 14.2 % (ref 11.5–15.5)
WBC: 4.9 10*3/uL (ref 4.0–10.5)
nRBC: 0 % (ref 0.0–0.2)

## 2023-04-13 LAB — COMPREHENSIVE METABOLIC PANEL
ALT: 16 U/L (ref 0–44)
AST: 26 U/L (ref 15–41)
Albumin: 4.1 g/dL (ref 3.5–5.0)
Alkaline Phosphatase: 55 U/L (ref 38–126)
Anion gap: 8 (ref 5–15)
BUN: 12 mg/dL (ref 8–23)
CO2: 28 mmol/L (ref 22–32)
Calcium: 9.1 mg/dL (ref 8.9–10.3)
Chloride: 100 mmol/L (ref 98–111)
Creatinine, Ser: 0.72 mg/dL (ref 0.44–1.00)
GFR, Estimated: >60 mL/min (ref >60)
Glucose, Bld: 79 mg/dL (ref 70–99)
Potassium: 3.4 mmol/L — ABNORMAL LOW (ref 3.5–5.1)
Sodium: 136 mmol/L (ref 135–145)
Total Bilirubin: 0.5 mg/dL (ref 0.3–1.2)
Total Protein: 6.8 g/dL (ref 6.5–8.1)

## 2023-04-13 LAB — VITAMIN D 25 HYDROXY (VIT D DEFICIENCY, FRACTURES): Vit D, 25-Hydroxy: 54.32 ng/mL (ref 30–100)

## 2023-04-20 ENCOUNTER — Encounter: Payer: Self-pay | Admitting: Hematology

## 2023-04-20 ENCOUNTER — Inpatient Hospital Stay: Payer: 59 | Admitting: Hematology

## 2023-04-20 DIAGNOSIS — C50912 Malignant neoplasm of unspecified site of left female breast: Secondary | ICD-10-CM | POA: Diagnosis not present

## 2023-04-20 DIAGNOSIS — Z170 Estrogen receptor positive status [ER+]: Secondary | ICD-10-CM | POA: Diagnosis not present

## 2023-04-20 NOTE — Progress Notes (Signed)
Virtual Visit via Telephone Note  I connected with Samantha Mcfarland on 04/20/23 at  4:00 PM EDT by telephone and verified that I am speaking with the correct person using two identifiers.  Location: Patient: At home Provider: At office   I discussed the limitations, risks, security and privacy concerns of performing an evaluation and management service by telephone and the availability of in person appointments. I also discussed with the patient that there may be a patient responsible charge related to this service. The patient expressed understanding and agreed to proceed.   History of Present Illness:  Samantha Mcfarland is a 67 y.o. female being called for results of a right breast mammogram done on 04/13/23 that found no mammographic evidence of malignancy. She was last seen by me in clinic on 10/13/22 and is diagnosed with Stage I (T1CN0) left breast IDC, ER positive, HER2/PR negative, Oncotype DX 33. She is currently taking Anastrozole, gabapentin, calcium, and Vitamin D supplements.    Observations/Objective: Patient seen for follow-up of her left breast cancer.  She had a fall and was feeling sore all over and could not come to the clinic in person.  Assessment and Plan:  1.  Stage I left breast IDC, ER positive, HER2 negative: - She reports that she mostly takes anastrozole but occasionally forgets.  She does not report any new onset pains. - Reviewed labs from 04/13/2023: Normal LFTs.  CBC grossly normal with mild anemia stable. - Right breast mammogram on 04/13/2023: BI-RADS Category 1. - Recommend follow-up in 6 months with repeat labs.  2.  Osteopenia: - DEXA scan in November 2022 with T-score -1.5. - Vitamin D level is 54.  Continue calcium and vitamin D supplements.  Will plan on repeating DEXA scan next year.   Follow Up Instructions: RTC 6 months.   I discussed the assessment and treatment plan with the patient. The patient was provided an opportunity to ask questions and all were  answered. The patient agreed with the plan and demonstrated an understanding of the instructions.   The patient was advised to call back or seek an in-person evaluation if the symptoms worsen or if the condition fails to improve as anticipated.  I provided 21 minutes of non-face-to-face time during this encounter.   Alben Deeds Teague,acting as a Neurosurgeon for Doreatha Massed, MD.,have documented all relevant documentation on the behalf of Doreatha Massed, MD,as directed by  Doreatha Massed, MD while in the presence of Doreatha Massed, MD.  I, Doreatha Massed MD, have reviewed the above documentation for accuracy and completeness, and I agree with the above.   Doreatha Massed, MD

## 2023-04-20 NOTE — Progress Notes (Signed)
Tolerating Anastrozole and taking as prescribed

## 2023-04-22 ENCOUNTER — Encounter: Payer: Self-pay | Admitting: *Deleted

## 2023-04-22 ENCOUNTER — Other Ambulatory Visit: Payer: Self-pay | Admitting: *Deleted

## 2023-04-22 NOTE — Patient Instructions (Signed)
Visit Information  Thank you for taking time to visit with me today. Please don't hesitate to contact me if I can be of assistance to you before our next scheduled telephone appointment.  Following are the goals we discussed today:   Goals Addressed             This Visit's Progress    COMPLETED: CCM (HYPERLIPIDEMIA) EXPECTED OUTCOME: MONITOR, SELF-MANAGE AND REDUCE SYMPTOMS OF HYPERLIPIDEMIA       Current Barriers:  Knowledge Deficits related to Hyperlipidemia management Chronic Disease Management support and education needs related to Hyperlipidemia, diet Financial Constraints.  Patient reports she has all medications and taking as prescribed, no new concerns reported Patient reports she now has a new primary care provider in [place] - [name] NP  Planned Interventions: Provider established cholesterol goals reviewed; Counseled on importance of regular laboratory monitoring as prescribed; Reviewed importance of limiting foods high in cholesterol; Reviewed exercise goals and target of 150 minutes per week; Reviewed heart healthy diet Reviewed plan of care with pt including case closure  Symptom Management: Take medications as prescribed   Attend all scheduled provider appointments Call pharmacy for medication refills 3-7 days in advance of running out of medications Attend church or other social activities Call provider office for new concerns or questions  Work with the social worker to address care coordination needs and will continue to work with the clinical team to address health care and disease management related needs - call doctor with any symptoms you believe are related to your medicine - call doctor when you experience any new symptoms - go to all doctor appointments as scheduled - adhere to prescribed diet: heart healthy - develop an exercise routine Follow heart healthy diet Bake or broil foods instead of frying Case closure        COMPLETED:  CCM (HYPERTENSION) EXPECTED OUTCOME: SELF-MANAGE AND REDUCE SYMPTOMS OF HYPERTENSION       Current Barriers:  Knowledge Deficits related to Hypertension management Care Coordination needs related to social work/ financial needs in a patient with Hypertension Chronic Disease Management support and education needs related to Hypertension, diet Lacks caregiver support.  Corporate treasurer.  No Advanced Directives in place- pt reports she is in process of completing, needs to have notarized Patient reports she lives alone, has a few friends she can call on if needed and they provide transportation, pt reports she uses a walker most of the time, is high risk for falls Patient reports she does not monitor blood pressure, states she will check into getting a blood pressure cuff No new concerns reported  Planned Interventions: Evaluation of current treatment plan related to hypertension self management and patient's adherence to plan as established by provider;   Reviewed medications with patient and discussed importance of compliance;  Counseled on the importance of exercise goals with target of 150 minutes per week Advised patient, providing education and rationale, to monitor blood pressure daily and record, calling PCP for findings outside established parameters;  Discussed complications of poorly controlled blood pressure such as heart disease, stroke, circulatory complications, vision complications, kidney impairment, sexual dysfunction;  Reviewed safety precautions Encouraged pt to have advanced directives notarized  Symptom Management: Take medications as prescribed   Attend all scheduled provider appointments Call pharmacy for medication refills 3-7 days in advance of running out of medications Attend church or other social activities Call provider office for new concerns or questions  Work with the social worker to address care coordination needs  and will continue to work with the  clinical team to address health care and disease management related needs check blood pressure weekly choose a place to take my blood pressure (home, clinic or office, retail store) write blood pressure results in a log or diary learn about high blood pressure keep a blood pressure log take blood pressure log to all doctor appointments keep all doctor appointments take medications for blood pressure exactly as prescribed report new symptoms to your doctor eat more whole grains, fruits and vegetables, lean meats and healthy fats Have advanced directives notarized fall prevention strategies: q position slowly, use assistive device such as walker or cane (per provider recommendations) when walking, keep walkways clear, have good lighting in room. It is important to contact your provider if you have any falls, maintain muscle strength/tone by exercise per provider recommendations.             Please call the care guide team at [phone] if you need to cancel or reschedule your appointment.   If you are experiencing a Mental Health or Behavioral Health Crisis or need someone to talk to, please call the Suicide and Crisis Lifeline: 988 call the [place] National Suicide Prevention Lifeline: 667 628 8323 or TTY: (803)587-9491 TTY 810-405-8961) to talk to a trained counselor call 1-800-273-TALK (toll free, 24 hour hotline) go to [facility] [address] [place] [phone]) call the [facility] Crisis Line: [phone] call 911   Patient verbalizes understanding of instructions and care plan provided today and agrees to view in MyChart. Active MyChart status and patient understanding of how to access instructions and care plan via MyChart confirmed with patient.     No further follow up required: case closure  Irving Shows [facility], BSN [place]/ Ambulatory Care Management [phone]

## 2023-04-22 NOTE — Patient Outreach (Signed)
Care Management   Visit Note  04/22/2023 Name: Samantha Mcfarland MRN: [id number] DOB: 1955/09/04  Subjective: Samantha Mcfarland is a 67 y.o. year old female who is a primary care patient of [name], NP. The Care Management team was consulted for assistance.      Engaged with patient spoke with patient by telephone for follow up   Goals Addressed             This Visit's Progress    COMPLETED: CCM (HYPERLIPIDEMIA) EXPECTED OUTCOME: MONITOR, SELF-MANAGE AND REDUCE SYMPTOMS OF HYPERLIPIDEMIA       Current Barriers:  Knowledge Deficits related to Hyperlipidemia management Chronic Disease Management support and education needs related to Hyperlipidemia, diet Financial Constraints.  Patient reports she has all medications and taking as prescribed, no new concerns reported Patient reports she now has a new primary care provider in [name]- [name] NP  Planned Interventions: Provider established cholesterol goals reviewed; Counseled on importance of regular laboratory monitoring as prescribed; Reviewed importance of limiting foods high in cholesterol; Reviewed exercise goals and target of 150 minutes per week; Reviewed heart healthy diet Reviewed plan of care with pt including case closure  Symptom Management: Take medications as prescribed   Attend all scheduled provider appointments Call pharmacy for medication refills 3-7 days in advance of running out of medications Attend church or other social activities Call provider office for new concerns or questions  Work with the social worker to address care coordination needs and will continue to work with the clinical team to address health care and disease management related needs - call doctor with any symptoms you believe are related to your medicine - call doctor when you experience any new symptoms - go to all doctor appointments as scheduled - adhere to prescribed diet: heart healthy - develop an exercise  routine Follow heart healthy diet Bake or broil foods instead of frying Case closure        COMPLETED: CCM (HYPERTENSION) EXPECTED OUTCOME: SELF-MANAGE AND REDUCE SYMPTOMS OF HYPERTENSION       Current Barriers:  Knowledge Deficits related to Hypertension management Care Coordination needs related to social work/ financial needs in a patient with Hypertension Chronic Disease Management support and education needs related to Hypertension, diet Lacks caregiver support.  Corporate treasurer.  No Advanced Directives in place- pt reports she is in process of completing, needs to have notarized Patient reports she lives alone, has a few friends she can call on if needed and they provide transportation, pt reports she uses a walker most of the time, is high risk for falls Patient reports she does not monitor blood pressure, states she will check into getting a blood pressure cuff No new concerns reported  Planned Interventions: Evaluation of current treatment plan related to hypertension self management and patient's adherence to plan as established by provider;   Reviewed medications with patient and discussed importance of compliance;  Counseled on the importance of exercise goals with target of 150 minutes per week Advised patient, providing education and rationale, to monitor blood pressure daily and record, calling PCP for findings outside established parameters;  Discussed complications of poorly controlled blood pressure such as heart disease, stroke, circulatory complications, vision complications, kidney impairment, sexual dysfunction;  Reviewed safety precautions Encouraged pt to have advanced directives notarized  Symptom Management: Take medications as prescribed   Attend all scheduled provider appointments Call pharmacy for medication refills 3-7 days in advance of running out of medications Attend church or other social  activities Call provider office for new concerns or  questions  Work with the social worker to address care coordination needs and will continue to work with the clinical team to address health care and disease management related needs check blood pressure weekly choose a place to take my blood pressure (home, clinic or office, retail store) write blood pressure results in a log or diary learn about high blood pressure keep a blood pressure log take blood pressure log to all doctor appointments keep all doctor appointments take medications for blood pressure exactly as prescribed report new symptoms to your doctor eat more whole grains, fruits and vegetables, lean meats and healthy fats Have advanced directives notarized fall prevention strategies: q position slowly, use assistive device such as walker or cane (per provider recommendations) when walking, keep walkways clear, have good lighting in room. It is important to contact your provider if you have any falls, maintain muscle strength/tone by exercise per provider recommendations.             Plan: No further follow up required: case closure  Irving Shows [facility], BSN [place]/ Ambulatory Care Management [phone]

## 2023-04-29 ENCOUNTER — Ambulatory Visit: Payer: Self-pay | Admitting: *Deleted

## 2023-04-29 NOTE — Patient Instructions (Signed)
Visit Information  Thank you for taking time to visit with me today. Please don't hesitate to contact me if I can be of assistance to you.   Following are the goals we discussed today:   Goals Addressed               This Visit's Progress     Find Help in My Community. (pt-stated)   On track     Care Coordination Interventions:  Interventions Today    Flowsheet Row Most Recent Value  Chronic Disease   Chronic disease during today's visit Hypertension (HTN), Other  [Balance Concerns, Generalized Anxiety Disorder, Chemotherapy from Breast Cancer, Osteopenia, Moderate, Recurrent Major Depression, Current Drinker, Requires Assistance with Activites of Daily Living, Tinnitus of Both Ears]  General Interventions   General Interventions Discussed/Reviewed General Interventions Discussed, Doctor Visits, General Interventions Reviewed, [employer], Level of Care, Communication with, Horticulturist, commercial (DME)  Doctor Visits Discussed/Reviewed Doctor Visits Discussed, Doctor Visits Reviewed, Annual Wellness Visits, PCP, Specialist  [Encouraged Engagement]  Durable Medical Equipment (DME) BP Cuff, Bed side commode, Environmental consultant, Wheelchair, Other  [Prescription Glasses, Medical laboratory scientific officer, Teaching laboratory technician, Paediatric nurse, [employer], Engineer, materials in [employer] Standard  PCP/Specialist Visits Compliance with follow-up visit  [Encouraged]  Communication with PCP/Specialists, Charity fundraiser, Pharmacists, Social Work  [employer] Engagement]  Level of Care Adult Daycare, Air traffic controller, Development worker, international aid, Assisted Living  Forensic scientist, FL-2  Exercise Interventions   Exercise Discussed/Reviewed Exercise Discussed, Exercise Reviewed, Physical Activity, Weight Managment, Assistive device use and maintanence  [Encouraged]  Physical Activity Discussed/Reviewed Physical Activity Discussed, Gym, PREP, Home Exercise Program (HEP), Types of exercise, Physical Activity Reviewed   [Encouraged]  Weight Management Weight maintenance  [Encouraged]  Education Interventions   Education Provided Provided Therapist, sports, Provided Web-based Education, Provided Education  Provided Verbal Education On Nutrition, Exercise, Medication, When to see the doctor, [employer], Development worker, community, Mental Health/Coping with Illness, Programmer, applications, FL-2  Mental Health Interventions   Mental Health Discussed/Reviewed Substance Abuse, Mental Health Discussed, Suicide, Mental Health Reviewed, Coping Strategies, Crisis, Anxiety, Depression, Grief and Loss  [Assessed]  Nutrition Interventions   Nutrition Discussed/Reviewed Nutrition Discussed, Portion sizes, Decreasing sugar intake, Nutrition Reviewed, Increasing proteins, Carbohydrate meal planning, Decreasing fats, Adding fruits and vegetables, Decreasing salt, Fluid intake, Supplemental nutrition  [Encouraged]  Pharmacy Interventions   Pharmacy Dicussed/Reviewed Pharmacy Topics Discussed, Affording Medications, Pharmacy Topics Reviewed, Medications and their functions, Medication Adherence  [Encouraged]  Medication Adherence --  [Medication Compliant]  Safety Interventions   Safety Discussed/Reviewed Safety Discussed, Safety Reviewed, Fall Risk, Home Safety  [Encouraged]  Home Engineer, water, Refer for community resources, Need for home safety assessment, Contact home health agency  Advanced Directive Interventions   Advanced Directives Discussed/Reviewed Advanced Directives Discussed, Advanced Directives Reviewed  [Encouraged Initiation, Offering Assistance]      Active Listening & Reflection Utilized.  Verbalization of Feelings Encouraged.  Emotional Support Provided. Problem Solving Interventions Activated.  Solution-Focused Strategies Indicated. Cognitive Behavioral Therapy Performed. Client-Centered Therapy Initiated. Confirmed Attendance at Initial Appointment with [name], Nurse Practitioner with [facility] of [place], [place] [phone]), Scheduled on 04/12/2023.  Confirmed Follow-Up Appointment with [name], Nurse Pracitioner with [facility] of [place], [place] [phone]), Scheduled on 05/13/2023.   Confirmed Approval for [facility], 5 Days Per Week, Monday - Friday, 2 1/2 Hours Per Day, through [facility] [phone]). CSW Collaboration with [name], Nurse Practitioner with [facility] [place], [place] (# [phone]), Via Routed Note in Epic, to Request Review & Signature of Application for [employer] & Submission to [employer] [phone]), for Processing. CSW Collaboration with [name], Nurse Practitioner with [facility] of [place], [place] (# [phone]), Via Routed Note in Epic, to Report Frequent Falls, At [facility] Twice Per Week.  Encouraged Review & Engagement with [name], [employer] of Interest in [place], from List Provided, Emailed on 04/29/2023: ~ Life Alert ~ Medical Alert ~ Life Line ~ [employer] Encouraged Routine Engagement with Representative from Aging, Disability & Transit Services of [place] [phone]), to Check Status of Name on Waiting List to Receive Prepared Meal Delivery Services through Meals on Wheels. Encouraged Routine Engagement with Danford Bad, Social Work Case Manager with [facility] [phone]), if You Have Questions, Need Assistance, or If Additional Social Work Needs Are Identified Between Now & Our Next Follow-Up Outreach Call, Scheduled on 05/13/2023 at 9:45 AM.      Our next appointment is by telephone on 05/13/2023 at 9:45 am.  Please call the care guide team at [phone] if you need to cancel or reschedule your appointment.   If you are experiencing a Mental Health or Behavioral Health Crisis or need  someone to talk to, please call the Suicide and Crisis Lifeline: 988 call the [place] National Suicide Prevention Lifeline: 8178183998 or TTY: 567-254-8740 TTY (774)012-8222) to talk to a trained counselor call 1-800-273-TALK (toll free, 24 hour hotline) go to [facility] [address], [place] [phone]) call the [facility] Crisis Line: [phone] call 911  Patient verbalizes understanding of instructions and care plan provided today and agrees to view in MyChart. Active MyChart status and patient understanding of how to access instructions and care plan via MyChart confirmed with patient.     Telephone follow up appointment with care management team member scheduled for:  05/13/2023 at 9:45 am.  Danford Bad, BSW, MSW, LCSW  Embedded Practice Social Work Case Manager  [facility], Population Health Direct Dial: [phone]  Fax: [phone] Email: [email or web address] Website: [email or web address]

## 2023-04-29 NOTE — Patient Outreach (Signed)
Care Coordination   Follow Up Visit Note   04/29/2023  Name: Samantha Mcfarland MRN: [id number] DOB: December 09, 1955  Samantha Mcfarland is a 67 y.o. year old female who sees [name], [name], NP for primary care. I spoke with [name] by phone today.  What matters to the patients health and wellness today?  Find Help in My Community.    Goals Addressed               This Visit's Progress     Find Help in My Community. (pt-stated)   On track     Care Coordination Interventions:  Interventions Today    Flowsheet Row Most Recent Value  Chronic Disease   Chronic disease during today's visit Hypertension (HTN), Other  [Balance Concerns, Generalized Anxiety Disorder, Chemotherapy from Breast Cancer, Osteopenia, Moderate, Recurrent Major Depression, Current Drinker, Requires Assistance with Activites of Daily Living, Tinnitus of Both Ears]  General Interventions   General Interventions Discussed/Reviewed General Interventions Discussed, Doctor Visits, General Interventions Reviewed, [employer], Level of Care, Communication with, Horticulturist, commercial (DME)  Doctor Visits Discussed/Reviewed Doctor Visits Discussed, Doctor Visits Reviewed, Annual Wellness Visits, PCP, Specialist  [Encouraged Engagement]  Durable Medical Equipment (DME) BP Cuff, Bed side commode, Environmental consultant, Wheelchair, Other  [Prescription Glasses, Medical laboratory scientific officer, Teaching laboratory technician, Paediatric nurse, [employer], Engineer, materials in [employer] Standard  PCP/Specialist Visits Compliance with follow-up visit  [Encouraged]  Communication with PCP/Specialists, Charity fundraiser, Pharmacists, Social Work  [employer] Engagement]  Level of Care Adult Daycare, Air traffic controller, Development worker, international aid, Assisted Living  Forensic scientist, FL-2  Exercise Interventions   Exercise Discussed/Reviewed Exercise Discussed, Exercise Reviewed, Physical Activity, Weight Managment, Assistive device use and maintanence  [Encouraged]  Physical Activity  Discussed/Reviewed Physical Activity Discussed, Gym, PREP, Home Exercise Program (HEP), Types of exercise, Physical Activity Reviewed  [Encouraged]  Weight Management Weight maintenance  [Encouraged]  Education Interventions   Education Provided Provided Therapist, sports, Provided Web-based Education, Provided Education  Provided Verbal Education On Nutrition, Exercise, Medication, When to see the doctor, [employer], Development worker, community, Mental Health/Coping with Illness, Programmer, applications, FL-2  Mental Health Interventions   Mental Health Discussed/Reviewed Substance Abuse, Mental Health Discussed, Suicide, Mental Health Reviewed, Coping Strategies, Crisis, Anxiety, Depression, Grief and Loss  [Assessed]  Nutrition Interventions   Nutrition Discussed/Reviewed Nutrition Discussed, Portion sizes, Decreasing sugar intake, Nutrition Reviewed, Increasing proteins, Carbohydrate meal planning, Decreasing fats, Adding fruits and vegetables, Decreasing salt, Fluid intake, Supplemental nutrition  [Encouraged]  Pharmacy Interventions   Pharmacy Dicussed/Reviewed Pharmacy Topics Discussed, Affording Medications, Pharmacy Topics Reviewed, Medications and their functions, Medication Adherence  [Encouraged]  Medication Adherence --  [Medication Compliant]  Safety Interventions   Safety Discussed/Reviewed Safety Discussed, Safety Reviewed, Fall Risk, Home Safety  [Encouraged]  Home Engineer, water, Refer for community resources, Need for home safety assessment, Contact home health agency  Advanced Directive Interventions   Advanced Directives Discussed/Reviewed Advanced Directives Discussed, Advanced Directives Reviewed  [Encouraged Initiation, Offering Assistance]      Active Listening & Reflection Utilized.  Verbalization of Feelings Encouraged.  Emotional Support Provided. Problem Solving Interventions Activated.  Solution-Focused Strategies  Indicated. Cognitive Behavioral Therapy Performed. Client-Centered Therapy Initiated. Confirmed Attendance at Initial Appointment with [name], Nurse Practitioner with [facility] of [place], [place] [phone]), Scheduled on 04/12/2023.  Confirmed Follow-Up Appointment with [name], Nurse Pracitioner with [facility] of [place], [place] [phone]), Scheduled on 05/13/2023.   Confirmed Approval for In-Home Care Assistance, 5  Days Per Week, Monday - Friday, 2 1/2 Hours Per Day, through [employer] [facility] [phone]). CSW Collaboration with [name], Nurse Practitioner with [facility] of [place], [place] (# [phone]), Via Routed Note in Epic, to Request Review & Signature of Application for [employer] & Submission to [place] [phone]), for Processing. CSW Collaboration with [name], Nurse Practitioner with [employer] [facility] of [place], [place] (# [phone]), Via Routed Note in Epic, to Report Frequent Falls, At [facility] Twice Per Week.  Encouraged Review & Engagement with [name], [employer] of Interest in [place], from List Provided, Emailed on 04/29/2023: ~ Life Alert ~ Medical Alert ~ Life Line ~ [employer] Encouraged Routine Engagement with Representative from Aging, Disability & Transit Services of [place] [phone]), to Check Status of Name on Waiting List to Receive Prepared Meal Delivery Services through Meals on Wheels. Encouraged Routine Engagement with Danford Bad, Social Work Case Manager with [facility] [phone]), if You Have Questions, Need Assistance, or If Additional Social Work Needs Are Identified Between Now & Our Next Follow-Up Outreach Call, Scheduled on 05/13/2023 at 9:45 AM.      SDOH assessments and interventions completed:  Yes.  Care Coordination Interventions:  Yes,  provided.   Follow up plan: Follow up call scheduled for 05/13/2023 at 9:45 am.  Encounter Outcome:  Patient Visit Completed.   Danford Bad, BSW, MSW, Printmaker Social Work Case Set designer Health  [facility], Population Health Direct Dial: [phone]  Fax: [phone] Email: [email or web address] Website: [email or web address]

## 2023-05-13 ENCOUNTER — Encounter: Payer: Self-pay | Admitting: *Deleted

## 2023-05-13 ENCOUNTER — Ambulatory Visit: Payer: Self-pay | Admitting: *Deleted

## 2023-05-13 DIAGNOSIS — C50919 Malignant neoplasm of unspecified site of unspecified female breast: Secondary | ICD-10-CM | POA: Diagnosis not present

## 2023-05-13 DIAGNOSIS — R296 Repeated falls: Secondary | ICD-10-CM | POA: Diagnosis not present

## 2023-05-13 DIAGNOSIS — I10 Essential (primary) hypertension: Secondary | ICD-10-CM | POA: Diagnosis not present

## 2023-05-13 NOTE — Patient Outreach (Signed)
Care Coordination   Follow Up Visit Note   05/13/2023  Name: Samantha Mcfarland MRN: [id number] DOB: May 31, 1956  Samantha Mcfarland is a 67 y.o. year old female who sees Samantha Mcfarland, [place], NP for primary care. I spoke with Samantha Mcfarland by phone today.  What matters to the patients health and wellness today?  Find Help in My Community.    Goals Addressed               This Visit's Progress     Find Help in My Community. (pt-stated)   On track     Care Coordination Interventions:  Interventions Today    Flowsheet Row Most Recent Value  Chronic Disease   Chronic disease during today's visit Hypertension (HTN), Other  [Balance Concerns, Generalized Anxiety Disorder, Chemotherapy from Breast Cancer, Osteopenia, Moderate, Recurrent Major Depression, Current Drinker, Requires Assistance with Activites of Daily Living, Tinnitus of Both Ears]  General Interventions   General Interventions Discussed/Reviewed General Interventions Discussed, Doctor Visits, General Interventions Reviewed, [employer], Level of Care, Communication with, Horticulturist, commercial (DME)  Doctor Visits Discussed/Reviewed Doctor Visits Discussed, Doctor Visits Reviewed, Annual Wellness Visits, PCP, Specialist  [Encouraged Engagement]  Durable Medical Equipment (DME) BP Cuff, Bed side commode, Environmental consultant, Wheelchair, Other  [Prescription Glasses, Medical laboratory scientific officer, Teaching laboratory technician, Paediatric nurse, [employer], Engineer, materials in [employer] Standard  PCP/Specialist Visits Compliance with follow-up visit  [Encouraged]  Communication with PCP/Specialists, Charity fundraiser, Pharmacists, Social Work  [employer] Engagement]  Level of Care Adult Daycare, Air traffic controller, Development worker, international aid, Assisted Living  Forensic scientist, FL-2  Exercise Interventions   Exercise Discussed/Reviewed Exercise Discussed, Exercise Reviewed, Physical Activity, Weight Managment, Assistive device use and maintanence  [Encouraged]  Physical  Activity Discussed/Reviewed Physical Activity Discussed, Gym, PREP, Home Exercise Program (HEP), Types of exercise, Physical Activity Reviewed  [Encouraged]  Weight Management Weight maintenance  [Encouraged]  Education Interventions   Education Provided Provided Therapist, sports, Provided Web-based Education, Provided Education  Provided Verbal Education On Nutrition, Exercise, Medication, When to see the doctor, [employer], Development worker, community, Mental Health/Coping with Illness, Programmer, applications, FL-2  Mental Health Interventions   Mental Health Discussed/Reviewed Substance Abuse, Mental Health Discussed, Suicide, Mental Health Reviewed, Coping Strategies, Crisis, Anxiety, Depression, Grief and Loss  [Assessed]  Nutrition Interventions   Nutrition Discussed/Reviewed Nutrition Discussed, Portion sizes, Decreasing sugar intake, Nutrition Reviewed, Increasing proteins, Carbohydrate meal planning, Decreasing fats, Adding fruits and vegetables, Decreasing salt, Fluid intake, Supplemental nutrition  [Encouraged]  Pharmacy Interventions   Pharmacy Dicussed/Reviewed Pharmacy Topics Discussed, Affording Medications, Pharmacy Topics Reviewed, Medications and their functions, Medication Adherence  [Encouraged]  Medication Adherence --  [Medication Compliant]  Safety Interventions   Safety Discussed/Reviewed Safety Discussed, Safety Reviewed, Fall Risk, Home Safety  [Encouraged]  Home Engineer, water, Refer for community resources, Need for home safety assessment, Contact home health agency  Advanced Directive Interventions   Advanced Directives Discussed/Reviewed Advanced Directives Discussed, Advanced Directives Reviewed  [Encouraged Initiation, Offering Assistance]      Active Listening & Reflection Utilized.  Verbalization of Feelings Encouraged.  Emotional Support Provided. Problem Solving Interventions Activated.  Solution-Focused Strategies  Indicated. Cognitive Behavioral Therapy Performed. Client-Centered Therapy Initiated. Confirmed Continued Engagement with [facility], to [facility], 5 Days Per Week, Monday - Friday, 2 1/2 Hours Per Day, through [facility] [phone]). Confirmed Receipt, Thoroughly Reviewed & Encouraged Engagement with [name], [employer] of Interest in [place], from List Provided: ~  Life Alert ~ Medical Alert ~ Life Line ~ [employer] Encouraged Routine Engagement with Representative from Aging, Disability & Transit Services of [place] [phone]), to Check Status of Name on Waiting List to Receive Prepared Meal Delivery Services through Meals on Wheels. Encouraged Routine Engagement with Danford Bad, Licensed Clinical Social Worker with [facility] [phone]), if You Have Questions, Need Assistance, or If Additional Social Work Needs Are Identified Between Now & Our Next Follow-Up Outreach Call, Scheduled on 05/30/2023 at 10:30 AM.        SDOH assessments and interventions completed:  Yes.  SDOH Interventions Today    Flowsheet Row Most Recent Value  SDOH Interventions   Food Insecurity Interventions Intervention Not Indicated  Housing Interventions Intervention Not Indicated  Transportation Interventions Intervention Not Indicated, Patient Resources (Friends/Family), Payor Benefit  [[employer] Medicare Dual Complete Hospital doctor Services & [place] Access Medicaid Transportation]  Utilities Interventions Intervention Not Indicated  Alcohol Usage Interventions Intervention Not Indicated (Score <7), Patient Declined  Financial Strain Interventions Intervention Not Indicated  Physical Activity Interventions Patient Declined  Stress Interventions Intervention Not Indicated  Social Connections Interventions Intervention Not Indicated  Health Literacy  Interventions Intervention Not Indicated     Care Coordination Interventions:  Yes, provided.   Follow up plan: Follow up call scheduled for 05/30/2023 at 10:30 am.  Encounter Outcome:  Patient Visit Completed.   Danford Bad, BSW, MSW, Printmaker Social Work Case Set designer Health  [facility], Population Health Direct Dial: [phone]  Fax: [phone] Email: [email or web address]: [email or web address]

## 2023-05-13 NOTE — Patient Instructions (Signed)
Visit Information  Thank you for taking time to visit with me today. Please don't hesitate to contact me if I can be of assistance to you.   Following are the goals we discussed today:   Goals Addressed               This Visit's Progress     Find Help in My Community. (pt-stated)   On track     Care Coordination Interventions:  Interventions Today    Flowsheet Row Most Recent Value  Chronic Disease   Chronic disease during today's visit Hypertension (HTN), Other  [Balance Concerns, Generalized Anxiety Disorder, Chemotherapy from Breast Cancer, Osteopenia, Moderate, Recurrent Major Depression, Current Drinker, Requires Assistance with Activites of Daily Living, Tinnitus of Both Ears]  General Interventions   General Interventions Discussed/Reviewed General Interventions Discussed, Doctor Visits, General Interventions Reviewed, [employer], Level of Care, Communication with, Horticulturist, commercial (DME)  Doctor Visits Discussed/Reviewed Doctor Visits Discussed, Doctor Visits Reviewed, Annual Wellness Visits, PCP, Specialist  [Encouraged Engagement]  Durable Medical Equipment (DME) BP Cuff, Bed side commode, Environmental consultant, Wheelchair, Other  [Prescription Glasses, Medical laboratory scientific officer, Teaching laboratory technician, Paediatric nurse, [employer], Engineer, materials in [employer] Standard  PCP/Specialist Visits Compliance with follow-up visit  [Encouraged]  Communication with PCP/Specialists, Charity fundraiser, Pharmacists, Social Work  [employer] Engagement]  Level of Care Adult Daycare, Air traffic controller, Development worker, international aid, Assisted Living  Forensic scientist, FL-2  Exercise Interventions   Exercise Discussed/Reviewed Exercise Discussed, Exercise Reviewed, Physical Activity, Weight Managment, Assistive device use and maintanence  [Encouraged]  Physical Activity Discussed/Reviewed Physical Activity Discussed, Gym, PREP, Home Exercise Program (HEP), Types of exercise, Physical Activity Reviewed   [Encouraged]  Weight Management Weight maintenance  [Encouraged]  Education Interventions   Education Provided Provided Therapist, sports, Provided Web-based Education, Provided Education  Provided Verbal Education On Nutrition, Exercise, Medication, When to see the doctor, [employer], Development worker, community, Mental Health/Coping with Illness, Programmer, applications, FL-2  Mental Health Interventions   Mental Health Discussed/Reviewed Substance Abuse, Mental Health Discussed, Suicide, Mental Health Reviewed, Coping Strategies, Crisis, Anxiety, Depression, Grief and Loss  [Assessed]  Nutrition Interventions   Nutrition Discussed/Reviewed Nutrition Discussed, Portion sizes, Decreasing sugar intake, Nutrition Reviewed, Increasing proteins, Carbohydrate meal planning, Decreasing fats, Adding fruits and vegetables, Decreasing salt, Fluid intake, Supplemental nutrition  [Encouraged]  Pharmacy Interventions   Pharmacy Dicussed/Reviewed Pharmacy Topics Discussed, Affording Medications, Pharmacy Topics Reviewed, Medications and their functions, Medication Adherence  [Encouraged]  Medication Adherence --  [Medication Compliant]  Safety Interventions   Safety Discussed/Reviewed Safety Discussed, Safety Reviewed, Fall Risk, Home Safety  [Encouraged]  Home Engineer, water, Refer for community resources, Need for home safety assessment, Contact home health agency  Advanced Directive Interventions   Advanced Directives Discussed/Reviewed Advanced Directives Discussed, Advanced Directives Reviewed  [Encouraged Initiation, Offering Assistance]      Active Listening & Reflection Utilized.  Verbalization of Feelings Encouraged.  Emotional Support Provided. Problem Solving Interventions Activated.  Solution-Focused Strategies Indicated. Cognitive Behavioral Therapy Performed. Client-Centered Therapy Initiated. Confirmed Continued Engagement with [facility], to  [facility], 5 Days Per Week, Monday - Friday, 2 1/2 Hours Per Day, through [facility] [phone]). Confirmed Receipt, Thoroughly Reviewed & Encouraged Engagement with [name], Wellsite geologist of Interest in [place], from List Provided: ~ Heritage manager ~ Building services engineer ~ Life Line ~ Engineer, manufacturing Encouraged Routine Engagement with Technical brewer from [place], Disability & Transit Services of [place] (#  [phone]), to Check Status of Name on Waiting List to Receive Prepared Meal Delivery Services through Meals on Wheels. Encouraged Routine Engagement with Danford Bad, Licensed Clinical Social Worker with [facility] [phone]), if You Have Questions, Need Assistance, or If Additional Social Work Needs Are Identified Between Now & Our Next Follow-Up Outreach Call, Scheduled on 05/30/2023 at 10:30 AM.      Our next appointment is by telephone on 05/30/2023 at 10:30 am.  Please call the care guide team at [phone] if you need to cancel or reschedule your appointment.   If you are experiencing a Mental Health or Behavioral Health Crisis or need someone to talk to, please call the Suicide and Crisis Lifeline: 988 call the [place] National Suicide Prevention Lifeline: 702-365-4872 or TTY: (629) 523-7781 TTY 734-852-0823) to talk to a trained counselor call 1-800-273-TALK (toll free, 24 hour hotline) go to [facility] [address] [place] [phone]) call the [facility] Crisis Line: 808-413-0947 call 911  Patient verbalizes understanding of instructions and care plan provided today and agrees to view in MyChart. Active MyChart status and patient understanding of how to access instructions and care plan via MyChart confirmed with patient.     Telephone follow up appointment with care management team member scheduled for:   05/30/2023 at 10:30  am.  Danford Bad, BSW, MSW, LCSW  Embedded Practice Social Work Case Manager  [facility], Population Health Direct Dial: [phone]  Fax: [phone] Email: [email or web address]

## 2023-05-30 ENCOUNTER — Ambulatory Visit: Payer: Self-pay | Admitting: *Deleted

## 2023-05-30 NOTE — Patient Outreach (Signed)
Care Coordination   Follow Up Visit Note   05/30/2023  Name: Simranjit Cherrington MRN: [id number] DOB: [birth year]  Samantha Mcfarland is a 67 y.o. year old female who sees [name], [name], NP for primary care. I spoke with [name] by phone today.  What matters to the patients health and wellness today?  Find Help in My Community.    Goals Addressed               This Visit's Progress     Find Help in My Community. (pt-stated)   On track     Care Coordination Interventions:  Interventions Today    Flowsheet Row Most Recent Value  Chronic Disease   Chronic disease during today's visit Hypertension (HTN), Other  [Balance Concerns, Generalized Anxiety Disorder, Chemotherapy from Breast Cancer, Osteopenia, Moderate, Recurrent Major Depression, Current Drinker, Needs Assistance with Activites of Daily Living, Tinnitus of Both Ears, Unsteady Balance/Gait, Fall Risk]  General Interventions   General Interventions Discussed/Reviewed General Interventions Discussed, Level of Care, General Interventions Reviewed, Durable Medical Equipment (DME), [employer], Doctor Visits, Communication with  [employer] with Care Team Members.]  Doctor Visits Discussed/Reviewed Doctor Visits Discussed, Doctor Visits Reviewed, Annual Wellness Visits, PCP, Specialist  [Encouraged Routine Engagement.]  Horticulturist, commercial (DME) BP Cuff, Bed side commode, Environmental consultant, Wheelchair, Other  [Prescription Glasses, Medical laboratory scientific officer, Teaching laboratory technician, Paediatric nurse, [employer], Engineer, materials in [employer] Standard  PCP/Specialist Visits Compliance with follow-up visit  [Encouraged Routine Engagement.]  Communication with PCP/Specialists, Charity fundraiser, Engineer, water, Social Work  [employer] Routine Engagement.]  Level of Care Adult Daycare, Air traffic controller, Assisted Living, Skilled Nursing Facility  [Confirmed Disinterest in Enrollment in Adult Day Care Program or Assistance Initiating Higher Level of Care Placement Options (I.e  Assisted Living or Skilled Nursing Facility).]  Applications Medicaid, Personal Care Services  [Confirmed Disinterest in Applying for Medicaid or Personal Care Services.]  Education Interventions   Education Provided Provided Education  [Encouraged Review & Consideration.]  Provided Verbal Education On Mental Health/Coping with Illness, [employer], [employer], Air traffic controller, When to see the doctor, Medication  [Encouraged Review & Consideration.]  Ship broker, Personal Care Services  [Confirmed Disinterest in Applying for [employer] or Personal Care Services.]  Mental Health Interventions   Mental Health Discussed/Reviewed Mental Health Discussed, Grief and Loss, Substance Abuse, Mental Health Reviewed, Suicide, Coping Strategies, Other, Crisis, Anxiety, Depression  [Assessed Mental Health & Cognitive Status.]  Pharmacy Interventions   Pharmacy Dicussed/Reviewed Pharmacy Topics Discussed, Medication Adherence, Pharmacy Topics Reviewed, Affording Medications, Medications and their functions  [Confirmed Ability to Afford Prescription Medications.]  Medication Adherence --  [Confirmed Prescription Medication Compliance.]  Safety Interventions   Safety Discussed/Reviewed Safety Discussed, Safety Reviewed, Fall Risk, Home Safety  [Encouraged Consideration of Home Safety Evaluation.]  Home Safety Assistive Devices, Need for home safety assessment, Refer for community resources  [Encouraged Routine Use of Assistive Devices.]  Advanced Directive Interventions   Advanced Directives Discussed/Reviewed Advanced Directives Discussed, Advanced Directives Reviewed  [Encouraged Initiation of Advanced Directives (Living Will & Healthcare Power of Corporate treasurer), Offering to [employer] & Assist with Completion.]      Active Listening & Reflection Utilized.  Verbalization of Feelings Encouraged.  Emotional Support Provided. Cognitive Behavioral Therapy Initiated. Client-Centered  Therapy Implemented. Acceptance & Commitment Therapy Performed. CSW Collaboration with [name], Nurse Practitioner & Primary Care Provider with [facility] & Urgent Care of [facility] [phone]), Via Secure Chat Message & Routed Note in Epic, to Inquire About Neurology Consult.  CSW Collaboration with [name], Nurse Practitioner & Primary Care Provider with [facility] & Urgent Care of [facility] [phone]), Via Secure Chat Message & Routed Note in Epic, to Report Unsteady Balance/Gait for Past 2 Months.  Encouraged Routine Engagement with Representative from Aging, Disability & Transit Services of [place] [phone]), to Check Status of Name on Waiting List to Receive Prepared Meal Delivery Services through Meals on Wheels. Encouraged Routine Engagement with Danford Bad, Licensed Clinical Social Worker with [facility] [phone]), if You Have Questions, Need Assistance, or If Additional Social Work Needs Are Identified Between Now & Our Next Follow-Up Outreach Call, Scheduled on 06/21/2023 at 11:30 AM.      SDOH assessments and interventions completed:  Yes.  Care Coordination Interventions:  Yes, provided.   Follow up plan: Follow up call scheduled for 06/21/2023 at 11:30 am.  Encounter Outcome:  Patient Visit Completed.   Danford Bad, BSW, MSW, Printmaker Social Work Case Set designer Health  [facility], Population Health Direct Dial: [phone]  Fax: [phone] Email: [email or web address]: [email or web address]

## 2023-05-30 NOTE — Patient Instructions (Signed)
Visit Information  Thank you for taking time to visit with me today. Please don't hesitate to contact me if I can be of assistance to you.   Following are the goals we discussed today:   Goals Addressed               This Visit's Progress     Find Help in My Community. (pt-stated)   On track     Care Coordination Interventions:  Interventions Today    Flowsheet Row Most Recent Value  Chronic Disease   Chronic disease during today's visit Hypertension (HTN), Other  [Balance Concerns, Generalized Anxiety Disorder, Chemotherapy from Breast Cancer, Osteopenia, Moderate, Recurrent Major Depression, Current Drinker, Needs Assistance with Activites of Daily Living, Tinnitus of Both Ears, Unsteady Balance/Gait, Fall Risk]  General Interventions   General Interventions Discussed/Reviewed General Interventions Discussed, Level of Care, General Interventions Reviewed, Durable Medical Equipment (DME), [employer], Doctor Visits, Communication with  [employer] with Care Team Members.]  Doctor Visits Discussed/Reviewed Doctor Visits Discussed, Doctor Visits Reviewed, Annual Wellness Visits, PCP, Specialist  [Encouraged Routine Engagement.]  Horticulturist, commercial (DME) BP Cuff, Bed side commode, Environmental consultant, Wheelchair, Other  [Prescription Glasses, Medical laboratory scientific officer, Teaching laboratory technician, Paediatric nurse, [employer], Engineer, materials in [employer] Standard  PCP/Specialist Visits Compliance with follow-up visit  [Encouraged Routine Engagement.]  Communication with PCP/Specialists, Charity fundraiser, Engineer, water, Social Work  [employer] Routine Engagement.]  Level of Care Adult Daycare, Air traffic controller, Assisted Living, Skilled Nursing Facility  [Confirmed Disinterest in Enrollment in Adult Day Care Program or Assistance Initiating Higher Level of Care Placement Options (I.e Assisted Living or Skilled Nursing Facility).]  Applications Medicaid, Personal Care Services  [Confirmed Disinterest in Applying for Medicaid  or Personal Care Services.]  Education Interventions   Education Provided Provided Education  [Encouraged Review & Consideration.]  Provided Verbal Education On Mental Health/Coping with Illness, [employer], [employer], Air traffic controller, When to see the doctor, Medication  [Encouraged Review & Consideration.]  Ship broker, Personal Care Services  [Confirmed Disinterest in Applying for [employer] or Personal Care Services.]  Mental Health Interventions   Mental Health Discussed/Reviewed Mental Health Discussed, Grief and Loss, Substance Abuse, Mental Health Reviewed, Suicide, Coping Strategies, Other, Crisis, Anxiety, Depression  [Assessed Mental Health & Cognitive Status.]  Pharmacy Interventions   Pharmacy Dicussed/Reviewed Pharmacy Topics Discussed, Medication Adherence, Pharmacy Topics Reviewed, Affording Medications, Medications and their functions  [Confirmed Ability to Afford Prescription Medications.]  Medication Adherence --  [Confirmed Prescription Medication Compliance.]  Safety Interventions   Safety Discussed/Reviewed Safety Discussed, Safety Reviewed, Fall Risk, Home Safety  [Encouraged Consideration of Home Safety Evaluation.]  Home Safety Assistive Devices, Need for home safety assessment, Refer for community resources  [Encouraged Routine Use of Assistive Devices.]  Advanced Directive Interventions   Advanced Directives Discussed/Reviewed Advanced Directives Discussed, Advanced Directives Reviewed  [Encouraged Initiation of Advanced Directives (Living Will & Healthcare Power of Corporate treasurer), Offering to [employer] & Assist with Completion.]      Active Listening & Reflection Utilized.  Verbalization of Feelings Encouraged.  Emotional Support Provided. Cognitive Behavioral Therapy Initiated. Client-Centered Therapy Implemented. Acceptance & Commitment Therapy Performed. CSW Collaboration with [name], Nurse Practitioner & Primary Care Provider  with [facility] & Urgent Care of [facility] [phone]), Via Secure Chat Message & Routed Note in Epic, to Inquire About Neurology Consult.  CSW Collaboration with [name], Nurse Practitioner & Primary Care Provider with [facility] & Urgent Care of [facility] [phone]), Via Secure Chat Message &  Routed Note in Epic, to Report Unsteady Balance/Gait for Past 2 Months.  Encouraged Routine Engagement with Representative from Aging, Disability & Transit Services of [place] [phone]), to Check Status of Name on Waiting List to Receive Prepared Meal Delivery Services through Meals on Wheels. Encouraged Routine Engagement with Danford Bad, Licensed Clinical Social Worker with [facility] [phone]), if You Have Questions, Need Assistance, or If Additional Social Work Needs Are Identified Between Now & Our Next Follow-Up Outreach Call, Scheduled on 06/21/2023 at 11:30 AM.      Our next appointment is by telephone on 06/21/2023 at 11:30 am.  Please call the care guide team at [phone] if you need to cancel or reschedule your appointment.   If you are experiencing a Mental Health or Behavioral Health Crisis or need someone to talk to, please call the Suicide and Crisis Lifeline: 988 call the [place] National Suicide Prevention Lifeline: 505-394-2216 or TTY: 418-560-3246 TTY 279-354-2957) to talk to a trained counselor call 1-800-273-TALK (toll free, 24 hour hotline) go to [facility] [address] [place] [phone]) call the [facility] Crisis Line: [phone] call 911  Patient verbalizes understanding of instructions and care plan provided today and agrees to view in MyChart. Active MyChart status and patient understanding of how to access instructions and care plan via MyChart confirmed with patient.     Telephone follow up appointment with care management team  member scheduled for: 06/21/2023 at 11:30 am.  Danford Bad, BSW, MSW, LCSW  Embedded Practice Social Work Case Manager  [facility] Population Health Direct Dial: [phone]  Fax: [phone] Email: [email or web address]

## 2023-06-06 DIAGNOSIS — Z853 Personal history of malignant neoplasm of breast: Secondary | ICD-10-CM | POA: Diagnosis not present

## 2023-06-06 DIAGNOSIS — R296 Repeated falls: Secondary | ICD-10-CM | POA: Diagnosis not present

## 2023-06-06 DIAGNOSIS — I10 Essential (primary) hypertension: Secondary | ICD-10-CM | POA: Diagnosis not present

## 2023-06-06 DIAGNOSIS — Z9010 Acquired absence of unspecified breast and nipple: Secondary | ICD-10-CM | POA: Diagnosis not present

## 2023-06-08 DIAGNOSIS — Z9010 Acquired absence of unspecified breast and nipple: Secondary | ICD-10-CM | POA: Diagnosis not present

## 2023-06-08 DIAGNOSIS — I10 Essential (primary) hypertension: Secondary | ICD-10-CM | POA: Diagnosis not present

## 2023-06-08 DIAGNOSIS — Z853 Personal history of malignant neoplasm of breast: Secondary | ICD-10-CM | POA: Diagnosis not present

## 2023-06-08 DIAGNOSIS — R296 Repeated falls: Secondary | ICD-10-CM | POA: Diagnosis not present

## 2023-06-16 DIAGNOSIS — I10 Essential (primary) hypertension: Secondary | ICD-10-CM | POA: Diagnosis not present

## 2023-06-16 DIAGNOSIS — Z9010 Acquired absence of unspecified breast and nipple: Secondary | ICD-10-CM | POA: Diagnosis not present

## 2023-06-16 DIAGNOSIS — Z853 Personal history of malignant neoplasm of breast: Secondary | ICD-10-CM | POA: Diagnosis not present

## 2023-06-16 DIAGNOSIS — R296 Repeated falls: Secondary | ICD-10-CM | POA: Diagnosis not present

## 2023-06-21 ENCOUNTER — Ambulatory Visit: Payer: Self-pay | Admitting: *Deleted

## 2023-06-21 DIAGNOSIS — Z9010 Acquired absence of unspecified breast and nipple: Secondary | ICD-10-CM | POA: Diagnosis not present

## 2023-06-21 DIAGNOSIS — R296 Repeated falls: Secondary | ICD-10-CM | POA: Diagnosis not present

## 2023-06-21 DIAGNOSIS — I10 Essential (primary) hypertension: Secondary | ICD-10-CM | POA: Diagnosis not present

## 2023-06-21 DIAGNOSIS — Z853 Personal history of malignant neoplasm of breast: Secondary | ICD-10-CM | POA: Diagnosis not present

## 2023-06-21 NOTE — Patient Instructions (Signed)
 Visit Information  Thank you for taking time to visit with me today. Please don't hesitate to contact me if I can be of assistance to you.   Following are the goals we discussed today:   Goals Addressed               This Visit's Progress     Find Help in My Community. (pt-stated)   On track     Care Coordination Interventions:   Interventions Today    Flowsheet Row Most Recent Value  Chronic Disease   Chronic disease during today's visit Hypertension (HTN), Other  [Balance Concerns, Generalized Anxiety Disorder, Chemotherapy from Breast Cancer, Osteopenia, Moderate, Recurrent Major Depression, Current Drinker, Needs Assistance with Activites of Daily Living, Tinnitus of Both Ears, Unsteady Balance/Gait, Fall Risk.]  General Interventions   General Interventions Discussed/Reviewed General Interventions Discussed, Labs, Vaccines, Doctor Visits, Health Screening, Annual Foot Exam, General Interventions Reviewed, Annual Eye Exam, Durable Medical Equipment (DME), Community Resources, Level of Care, Communication with  [Encouraged Routine Engagement with Care Team Members & Providers.]  Labs Hgb A1c every 3 months, Kidney Function, Hgb A1c annually  [Encouraged Routine Labwork.]  Vaccines Flu, COVID-19, Pneumonia, RSV, Shingles, Tetanus/Pertussis/Diphtheria  [Encouraged Routine Vaccinations.]  Doctor Visits Discussed/Reviewed Doctor Visits Discussed, Specialist, Doctor Visits Reviewed, Annual Wellness Visits, PCP  [Encouraged Routine Engagement with Care Team Members & Providers.]  Health Screening Bone Density, Colonoscopy, Mammogram  [Encouraged Annual Health Screenings.]  Durable Medical Equipment (DME) Bed side commode, BP Cuff, Environmental Consultant, Wheelchair, Other  [Prescription Eyeglasses, Medical Laboratory Scientific Officer, Teaching Laboratory Technician, Paediatric Nurse, [employer], Engineer, Materials in Shower.]  Wheelchair Standard  PCP/Specialist Visits Compliance with follow-up visit  [Encouraged Routine Engagement with Care Team  Members & Providers.]  Communication with PCP/Specialists, CHARITY FUNDRAISER, Pharmacists, Social Work  [employer] Routine Engagement with Care Team Members & Providers.]  Level of Care Adult Daycare, Air Traffic Controller, Assisted Living, Skilled Nursing Facility  [Confirmed Disinterest in Enrollment in Adult Day Care Program or Receiving Assistance Pursuing Higher Level of Care Placement Options (I.e Assisted Living Versus Skilled Nursing Facility).]  Applications Medicaid, Personal Care Services  [Confirmed Disinterest in Applying for Medicaid or Personal Care Services.]  Exercise Interventions   Exercise Discussed/Reviewed Exercise Discussed, Assistive device use and maintanence, Exercise Reviewed, Physical Activity, Weight Managment  [Encouraged Daily Exercise Regimen, Inside & Outside the Home, as Tolerated.]  Physical Activity Discussed/Reviewed Types of exercise, Gym, PREP, Physical Activity Reviewed, Physical Activity Discussed, Home Exercise Program (HEP)  [Encouraged Increased Engagement in Activities of Interest.]  Weight Management Weight maintenance  [Encouraged Supplemental Nutrition.]  Education Interventions   Education Provided Provided Education  [Thoroughly Reviewed Educational Material.]  Provided Verbal Education On Nutrition, Mental Health/Coping with Illness, When to see the doctor, Foot Care, Eye Care, Labs, Blood Sugar Monitoring, Applications, Exercise, Medication, Programmer, Applications, Development Worker, Community  [employer] Implementation of Educational Material.]  Labs Reviewed Hgb A1c  [Reviewed.]  Ship Broker, Personal Care Services  [Confirmed Disinterest in Applying for Medicaid or Personal Care Services.]  Mental Health Interventions   Mental Health Discussed/Reviewed Mental Health Discussed, Anxiety, Depression, Grief and Loss, Mental Health Reviewed, Substance Abuse, Coping Strategies, Suicide, Crisis, Other  [Assessed Mental Health & Cognitive Status.]  Nutrition Interventions    Nutrition Discussed/Reviewed Nutrition Discussed, Adding fruits and vegetables, Increasing proteins, Decreasing fats, Decreasing salt, Fluid intake, Nutrition Reviewed, Carbohydrate meal planning, Portion sizes, Decreasing sugar intake, Supplemental nutrition  [Encouraged Heart-Healthy, Reduced Fat, Low Sugar Diet.]  Pharmacy Interventions   Pharmacy Dicussed/Reviewed Pharmacy Topics Discussed, Medications and  their functions, Pharmacy Topics Reviewed, Medication Adherence, Affording Medications  [Confirmed Ability to Afford Prescription Medications.]  Medication Adherence --  [Confirmed Compliance with Prescription Medications.]  Safety Interventions   Safety Discussed/Reviewed Safety Discussed, Safety Reviewed, Fall Risk, Home Safety  [Encouraged Routine Use of Home Safety Devices & Durable Medical Equipment.]  Home Safety Assistive Devices, Need for home safety assessment, Refer for community resources  [Encouraged Consideration of Home Safety Evaluation.]  Advanced Directive Interventions   Advanced Directives Discussed/Reviewed Advanced Directives Discussed, Advanced Directives Reviewed  [Encouraged Initiation of Advanced Directives (Living Will & Healthcare Power of Corporate Treasurer), Offering to [employer], Assist with Completion, Make Copies & Scan into Electronic Medical Record in Epic..]       Active Listening & Reflection Utilized.  Verbalization of Feelings Encouraged.  Emotional Support Provided. Cognitive Behavioral Therapy Conducted. Client-Centered Therapy Performed. Acceptance & Commitment Therapy Initiated. Encouraged Routine Engagement with Representative from Aging, Disability & Transit Services of [place] [phone]), to Check Status of Name on Waiting List to Receive Prepared Meal Delivery Services through Meals on Wheels. Encouraged Routine Engagement with Shavontae Gibeault, Licensed Clinical Social Worker with [facility] [phone]),  if You Have Questions, Need Assistance, or If Additional Social Work Needs Are Identified Between Now & Our Next Follow-Up Outreach Call, Scheduled on 07/18/2023 at 9:15 AM.      Our next appointment is by telephone on 07/18/2023 at 9:15 am.  Please call the care guide team at [phone] if you need to cancel or reschedule your appointment.   If you are experiencing a Mental Health or Behavioral Health Crisis or need someone to talk to, please call the Suicide and Crisis Lifeline: 988 call the [place]  National Suicide Prevention Lifeline: (417)359-8783 or TTY: 838-436-1342 TTY 226-446-7862) to talk to a trained counselor call 1-800-273-TALK (toll free, 24 hour hotline) go to [facility] [address] [place] [phone]) call the [facility] Crisis Line: [phone] call 911  Patient verbalizes understanding of instructions and care plan provided today and agrees to view in MyChart. Active MyChart status and patient understanding of how to access instructions and care plan via MyChart confirmed with patient.     Telephone follow up appointment with care management team member scheduled for:  07/18/2023 at 9:15 am.  Philippe Desanctis, BSW, MSW, LCSW  Embedded [employer] Social Work Case Manager  [facility], Population Health Direct Dial: [phone]  Fax: [phone] Email: [email or web address] Website: [email or web address]

## 2023-06-21 NOTE — Patient Outreach (Signed)
 Care Coordination   Follow Up Visit Note   06/21/2023  Name: Temekia Caskey MRN: [id number] DOB: [birth year]  Samantha Mcfarland is a 67 y.o. year old female who sees Renato Dorothey HERO, NP for primary care. I spoke with Devere Charles by phone today.  What matters to the patients health and wellness today?  Find Help in My Community.    Goals Addressed               This Visit's Progress     Find Help in My Community. (pt-stated)   On track     Care Coordination Interventions:   Interventions Today    Flowsheet Row Most Recent Value  Chronic Disease   Chronic disease during today's visit Hypertension (HTN), Other  [Balance Concerns, Generalized Anxiety Disorder, Chemotherapy from Breast Cancer, Osteopenia, Moderate, Recurrent Major Depression, Current Drinker, Needs Assistance with Activites of Daily Living, Tinnitus of Both Ears, Unsteady Balance/Gait, Fall Risk.]  General Interventions   General Interventions Discussed/Reviewed General Interventions Discussed, Labs, Vaccines, Doctor Visits, Health Screening, Annual Foot Exam, General Interventions Reviewed, Annual Eye Exam, Durable Medical Equipment (DME), Community Resources, Level of Care, Communication with  [Encouraged Routine Engagement with Care Team Members & Providers.]  Labs Hgb A1c every 3 months, Kidney Function, Hgb A1c annually  [Encouraged Routine Labwork.]  Vaccines Flu, COVID-19, Pneumonia, RSV, Shingles, Tetanus/Pertussis/Diphtheria  [Encouraged Routine Vaccinations.]  Doctor Visits Discussed/Reviewed Doctor Visits Discussed, Specialist, Doctor Visits Reviewed, Annual Wellness Visits, PCP  [Encouraged Routine Engagement with Care Team Members & Providers.]  Health Screening Bone Density, Colonoscopy, Mammogram  [Encouraged Annual Health Screenings.]  Durable Medical Equipment (DME) Bed side commode, BP Cuff, Environmental Consultant, Wheelchair, Other  [Prescription Eyeglasses, Medical Laboratory Scientific Officer, Teaching Laboratory Technician, Paediatric Nurse, [employer], Printmaker in Shower.]  Wheelchair Standard  PCP/Specialist Visits Compliance with follow-up visit  [Encouraged Routine Engagement with Care Team Members & Providers.]  Communication with PCP/Specialists, CHARITY FUNDRAISER, Pharmacists, Social Work  [employer] Routine Engagement with Care Team Members & Providers.]  Level of Care Adult Daycare, Air Traffic Controller, Assisted Living, Skilled Nursing Facility  [Confirmed Disinterest in Enrollment in Adult Day Care Program or Receiving Assistance Pursuing Higher Level of Care Placement Options (I.e Assisted Living Versus Skilled Nursing Facility).]  Applications Medicaid, Personal Care Services  [Confirmed Disinterest in Applying for Medicaid or Personal Care Services.]  Exercise Interventions   Exercise Discussed/Reviewed Exercise Discussed, Assistive device use and maintanence, Exercise Reviewed, Physical Activity, Weight Managment  [Encouraged Daily Exercise Regimen, Inside & Outside the Home, as Tolerated.]  Physical Activity Discussed/Reviewed Types of exercise, Gym, PREP, Physical Activity Reviewed, Physical Activity Discussed, Home Exercise Program (HEP)  [Encouraged Increased Engagement in Activities of Interest.]  Weight Management Weight maintenance  [Encouraged Supplemental Nutrition.]  Education Interventions   Education Provided Provided Education  [Thoroughly Reviewed Educational Material.]  Provided Verbal Education On Nutrition, Mental Health/Coping with Illness, When to see the doctor, Foot Care, Eye Care, Labs, Blood Sugar Monitoring, Applications, Exercise, Medication, Programmer, Applications, Development Worker, Community  [employer] Implementation of Educational Material.]  Labs Reviewed Hgb A1c  [Reviewed.]  Ship Broker, Personal Care Services  [Confirmed Disinterest in Applying for Medicaid or Personal Care Services.]  Mental Health Interventions   Mental Health Discussed/Reviewed Mental Health Discussed, Anxiety, Depression, Grief and Loss, Mental Health Reviewed,  Substance Abuse, Coping Strategies, Suicide, Crisis, Other  [Assessed Mental Health & Cognitive Status.]  Nutrition Interventions   Nutrition Discussed/Reviewed Nutrition Discussed, Adding fruits and vegetables, Increasing proteins, Decreasing fats, Decreasing salt, Fluid intake, Nutrition Reviewed, Carbohydrate meal planning,  Portion sizes, Decreasing sugar intake, Supplemental nutrition  [Encouraged Heart-Healthy, Reduced Fat, Low Sugar Diet.]  Pharmacy Interventions   Pharmacy Dicussed/Reviewed Pharmacy Topics Discussed, Medications and their functions, Pharmacy Topics Reviewed, Medication Adherence, Affording Medications  [Confirmed Ability to Afford Prescription Medications.]  Medication Adherence --  [Confirmed Compliance with Prescription Medications.]  Safety Interventions   Safety Discussed/Reviewed Safety Discussed, Safety Reviewed, Fall Risk, Home Safety  [Encouraged Routine Use of Home Safety Devices & Durable Medical Equipment.]  Home Safety Assistive Devices, Need for home safety assessment, Refer for community resources  [Encouraged Consideration of Home Safety Evaluation.]  Advanced Directive Interventions   Advanced Directives Discussed/Reviewed Advanced Directives Discussed, Advanced Directives Reviewed  [Encouraged Initiation of Advanced Directives (Living Will & Healthcare Power of Corporate Treasurer), Offering to [employer], Assist with Completion, Make Copies & Scan into Electronic Medical Record in Epic..]       Active Listening & Reflection Utilized.  Verbalization of Feelings Encouraged.  Emotional Support Provided. Cognitive Behavioral Therapy Conducted. Client-Centered Therapy Performed. Acceptance & Commitment Therapy Initiated. Encouraged Routine Engagement with Representative from Aging, Disability & Transit Services of [place] [phone]), to Check Status of Name on Waiting List to Receive Prepared Meal Delivery Services through Meals on  Wheels. Encouraged Routine Engagement with Tabita Corbo, Licensed Clinical Social Worker with [facility] [phone]), if You Have Questions, Need Assistance, or If Additional Social Work Needs Are Identified Between Now & Our Next Follow-Up Outreach Call, Scheduled on 07/18/2023 at 9:15 AM.      SDOH assessments and interventions completed:  Yes.  Care Coordination Interventions:  Yes, provided.   Follow up plan: Follow up call scheduled for 07/18/2023 at 9:15 am.  Encounter Outcome:  Patient Visit Completed.   Philippe Desanctis, BSW, MSW, Printmaker Social Work Case Set Designer Health  [facility], Population Health Direct Dial: [phone]  Fax: [phone] Email: [email or web address]

## 2023-06-25 ENCOUNTER — Other Ambulatory Visit: Payer: Self-pay | Admitting: Family Medicine

## 2023-06-25 DIAGNOSIS — E782 Mixed hyperlipidemia: Secondary | ICD-10-CM

## 2023-06-28 DIAGNOSIS — R296 Repeated falls: Secondary | ICD-10-CM | POA: Diagnosis not present

## 2023-06-28 DIAGNOSIS — I10 Essential (primary) hypertension: Secondary | ICD-10-CM | POA: Diagnosis not present

## 2023-06-28 DIAGNOSIS — Z853 Personal history of malignant neoplasm of breast: Secondary | ICD-10-CM | POA: Diagnosis not present

## 2023-06-28 DIAGNOSIS — Z9010 Acquired absence of unspecified breast and nipple: Secondary | ICD-10-CM | POA: Diagnosis not present

## 2023-06-30 DIAGNOSIS — I10 Essential (primary) hypertension: Secondary | ICD-10-CM | POA: Diagnosis not present

## 2023-06-30 DIAGNOSIS — Z9010 Acquired absence of unspecified breast and nipple: Secondary | ICD-10-CM | POA: Diagnosis not present

## 2023-06-30 DIAGNOSIS — R296 Repeated falls: Secondary | ICD-10-CM | POA: Diagnosis not present

## 2023-06-30 DIAGNOSIS — Z853 Personal history of malignant neoplasm of breast: Secondary | ICD-10-CM | POA: Diagnosis not present

## 2023-07-05 DIAGNOSIS — R296 Repeated falls: Secondary | ICD-10-CM | POA: Diagnosis not present

## 2023-07-05 DIAGNOSIS — Z853 Personal history of malignant neoplasm of breast: Secondary | ICD-10-CM | POA: Diagnosis not present

## 2023-07-05 DIAGNOSIS — I10 Essential (primary) hypertension: Secondary | ICD-10-CM | POA: Diagnosis not present

## 2023-07-05 DIAGNOSIS — Z9010 Acquired absence of unspecified breast and nipple: Secondary | ICD-10-CM | POA: Diagnosis not present

## 2023-07-06 DIAGNOSIS — Z853 Personal history of malignant neoplasm of breast: Secondary | ICD-10-CM | POA: Diagnosis not present

## 2023-07-06 DIAGNOSIS — R296 Repeated falls: Secondary | ICD-10-CM | POA: Diagnosis not present

## 2023-07-06 DIAGNOSIS — I10 Essential (primary) hypertension: Secondary | ICD-10-CM | POA: Diagnosis not present

## 2023-07-06 DIAGNOSIS — Z9010 Acquired absence of unspecified breast and nipple: Secondary | ICD-10-CM | POA: Diagnosis not present

## 2023-07-18 ENCOUNTER — Ambulatory Visit: Payer: Self-pay | Admitting: *Deleted

## 2023-07-18 NOTE — Patient Instructions (Signed)
Visit Information  Thank you for taking time to visit with me today. Please don't hesitate to contact me if I can be of assistance to you.   Following are the goals we discussed today:   Goals Addressed               This Visit's Progress     COMPLETED: Find Help in My Community. (pt-stated)   On track     Care Coordination Interventions:   Interventions Today    Flowsheet Row Most Recent Value  Chronic Disease   Chronic disease during today's visit Hypertension (HTN), Other  [Balance Concerns, Generalized Anxiety Disorder, Chemotherapy from Breast Cancer, Osteopenia, Moderate, Recurrent Major Depression, Current Drinker, Needs Assistance with Activites of Daily Living, Tinnitus of Both Ears, Unsteady Balance/Gait, Fall Risk.]  General Interventions   General Interventions Discussed/Reviewed General Interventions Discussed, Labs, Vaccines, Doctor Visits, Health Screening, Annual Foot Exam, General Interventions Reviewed, Annual Eye Exam, Durable Medical Equipment (DME), Community Resources, Level of Care, Communication with  [Encouraged Routine Engagement with Care Team Members & Providers.]  Labs Hgb A1c every 3 months, Kidney Function, Hgb A1c annually  [Encouraged Routine Labwork.]  Vaccines Flu, COVID-19, Pneumonia, RSV, Shingles, Tetanus/Pertussis/Diphtheria  [Encouraged Routine Vaccinations.]  Doctor Visits Discussed/Reviewed Doctor Visits Discussed, Specialist, Doctor Visits Reviewed, Annual Wellness Visits, PCP  [Encouraged Routine Engagement with Care Team Members & Providers.]  Health Screening Bone Density, Colonoscopy, Mammogram  [Encouraged Annual Health Screenings.]  Durable Medical Equipment (DME) Bed side commode, BP Cuff, Environmental consultant, Wheelchair, Other  [Prescription Eyeglasses, Medical laboratory scientific officer, Teaching laboratory technician, Paediatric nurse, [employer], Engineer, materials in Shower.]  Wheelchair Standard  PCP/Specialist Visits Compliance with follow-up visit  [Encouraged Routine Engagement with Care  Team Members & Providers.]  Communication with PCP/Specialists, Charity fundraiser, Pharmacists, Social Work  [employer] Routine Engagement with Care Team Members & Providers.]  Level of Care Adult Daycare, Air traffic controller, Assisted Living, Skilled Nursing Facility  [Confirmed Disinterest in Enrollment in Adult Day Care Program or Receiving Assistance Pursuing Higher Level of Care Placement Options (I.e Assisted Living Versus Skilled Nursing Facility).]  Applications Medicaid, Personal Care Services  [Confirmed Disinterest in Applying for Medicaid or Personal Care Services.]  Exercise Interventions   Exercise Discussed/Reviewed Exercise Discussed, Assistive device use and maintanence, Exercise Reviewed, Physical Activity, Weight Managment  [Encouraged Daily Exercise Regimen, Inside & Outside the Home, as Tolerated.]  Physical Activity Discussed/Reviewed Types of exercise, Gym, PREP, Physical Activity Reviewed, Physical Activity Discussed, Home Exercise Program (HEP)  [Encouraged Increased Engagement in Activities of Interest.]  Weight Management Weight maintenance  [Encouraged Supplemental Nutrition.]  Education Interventions   Education Provided Provided Education  [Thoroughly Reviewed Educational Material.]  Provided Verbal Education On Nutrition, Mental Health/Coping with Illness, When to see the doctor, Foot Care, Eye Care, Labs, Blood Sugar Monitoring, Applications, Exercise, Medication, Programmer, applications, Development worker, community  [employer] Implementation of Educational Material.]  Labs Reviewed Hgb A1c  [Reviewed.]  Ship broker, Personal Care Services  [Confirmed Disinterest in Applying for Medicaid or Personal Care Services.]  Mental Health Interventions   Mental Health Discussed/Reviewed Mental Health Discussed, Anxiety, Depression, Grief and Loss, Mental Health Reviewed, Substance Abuse, Coping Strategies, Suicide, Crisis, Other  [Assessed Mental Health & Cognitive Status.]  Nutrition Interventions    Nutrition Discussed/Reviewed Nutrition Discussed, Adding fruits and vegetables, Increasing proteins, Decreasing fats, Decreasing salt, Fluid intake, Nutrition Reviewed, Carbohydrate meal planning, Portion sizes, Decreasing sugar intake, Supplemental nutrition  [Encouraged Heart-Healthy, Reduced Fat, Low Sugar Diet.]  Pharmacy Interventions   Pharmacy Dicussed/Reviewed Pharmacy Topics Discussed, Medications  and their functions, Pharmacy Topics Reviewed, Medication Adherence, Affording Medications  [Confirmed Ability to Afford Prescription Medications.]  Medication Adherence --  [Confirmed Compliance with Prescription Medications.]  Safety Interventions   Safety Discussed/Reviewed Safety Discussed, Safety Reviewed, Fall Risk, Home Safety  [Encouraged Routine Use of Home Safety Devices & Durable Medical Equipment.]  Home Safety Assistive Devices, Need for home safety assessment, Refer for community resources  [Encouraged Consideration of Home Safety Evaluation.]  Advanced Directive Interventions   Advanced Directives Discussed/Reviewed Advanced Directives Discussed, Advanced Directives Reviewed  [Encouraged Initiation of Advanced Directives (Living Will & Healthcare Power of Corporate treasurer), Offering to [employer], Assist with Completion, Make Copies & Scan into Electronic Medical Record in Epic..]       Active Listening & Reflection Utilized.  Verbalization of Feelings Encouraged.  Emotional Support Provided. Cognitive Behavioral Therapy Performed. Client-Centered Therapy Initiated. Encouraged Routine Engagement with Representative from Aging, Disability & Transit Services of [place] [phone]), to Check Status of Name on Waiting List to Receive Prepared Meal Delivery Services through Meals on Wheels. Encouraged Engagement with Danford Bad, Licensed Clinical Social Worker with [facility] [phone]), if You Have Questions, Need Assistance, Additional  Social Work Needs Are Identified in The Near Future, of If You Change Your Mind About Wanting to Receive Social Work [employer].       Please call the care guide team at [phone] if you need to cancel or reschedule your appointment.   If you are experiencing a Mental Health or Behavioral Health Crisis or need someone to talk to, please call the Suicide and Crisis Lifeline: 988 call the [place] National Suicide Prevention Lifeline: 479-604-8266 or TTY: (913) 365-5698 TTY (530)079-2873) to talk to a trained counselor call 1-800-273-TALK (toll free, 24 hour hotline) go to [facility] [address] [place] [phone]) call the [facility] [place] Crisis Line: [phone] call 911  Patient verbalizes understanding of instructions and care plan provided today and agrees to view in MyChart. Active MyChart status and patient understanding of how to access instructions and care plan via MyChart confirmed with patient.     No further follow up required.  Danford Bad, BSW, MSW, LCSW [facility], [facility] Clinical Social Worker II Direct Dial: [phone]  Fax: [phone] Website: [email or web address]

## 2023-07-18 NOTE — Patient Outreach (Signed)
Care Coordination   Follow Up Visit Note   07/18/2023  Name: Samantha Mcfarland MRN: [id number] DOB: 03/04/1956  Samantha Mcfarland is a 68 y.o. year old female who sees [name], [name], NP for primary care. I spoke with [name] by phone today.  What matters to the patients health and wellness today?  Find Help in My Community.    Goals Addressed               This Visit's Progress     COMPLETED: Find Help in My Community. (pt-stated)   On track     Care Coordination Interventions:   Interventions Today    Flowsheet Row Most Recent Value  Chronic Disease   Chronic disease during today's visit Hypertension (HTN), Other  [Balance Concerns, Generalized Anxiety Disorder, Chemotherapy from Breast Cancer, Osteopenia, Moderate, Recurrent Major Depression, Current Drinker, Needs Assistance with Activites of Daily Living, Tinnitus of Both Ears, Unsteady Balance/Gait, Fall Risk.]  General Interventions   General Interventions Discussed/Reviewed General Interventions Discussed, Labs, Vaccines, Doctor Visits, Health Screening, Annual Foot Exam, General Interventions Reviewed, Annual Eye Exam, Durable Medical Equipment (DME), Community Resources, Level of Care, Communication with  [Encouraged Routine Engagement with Care Team Members & Providers.]  Labs Hgb A1c every 3 months, Kidney Function, Hgb A1c annually  [Encouraged Routine Labwork.]  Vaccines Flu, COVID-19, Pneumonia, RSV, Shingles, Tetanus/Pertussis/Diphtheria  [Encouraged Routine Vaccinations.]  Doctor Visits Discussed/Reviewed Doctor Visits Discussed, Specialist, Doctor Visits Reviewed, Annual Wellness Visits, PCP  [Encouraged Routine Engagement with Care Team Members & Providers.]  Health Screening Bone Density, Colonoscopy, Mammogram  [Encouraged Annual Health Screenings.]  Durable Medical Equipment (DME) Bed side commode, BP Cuff, Environmental consultant, Wheelchair, Other  [Prescription Eyeglasses, Medical laboratory scientific officer, Teaching laboratory technician, Paediatric nurse, [employer], Engineer, materials in Shower.]  Wheelchair Standard  PCP/Specialist Visits Compliance with follow-up visit  [Encouraged Routine Engagement with Care Team Members & Providers.]  Communication with PCP/Specialists, Charity fundraiser, Pharmacists, Social Work  [employer] Routine Engagement with Care Team Members & Providers.]  Level of Care Adult Daycare, Air traffic controller, Assisted Living, Skilled Nursing Facility  [Confirmed Disinterest in Enrollment in Adult Day Care Program or Receiving Assistance Pursuing Higher Level of Care Placement Options (I.e Assisted Living Versus Skilled Nursing Facility).]  Applications Medicaid, Personal Care Services  [Confirmed Disinterest in Applying for Medicaid or Personal Care Services.]  Exercise Interventions   Exercise Discussed/Reviewed Exercise Discussed, Assistive device use and maintanence, Exercise Reviewed, Physical Activity, Weight Managment  [Encouraged Daily Exercise Regimen, Inside & Outside the Home, as Tolerated.]  Physical Activity Discussed/Reviewed Types of exercise, Gym, PREP, Physical Activity Reviewed, Physical Activity Discussed, Home Exercise Program (HEP)  [Encouraged Increased Engagement in Activities of Interest.]  Weight Management Weight maintenance  [Encouraged Supplemental Nutrition.]  Education Interventions   Education Provided Provided Education  [Thoroughly Reviewed Educational Material.]  Provided Verbal Education On Nutrition, Mental Health/Coping with Illness, When to see the doctor, Foot Care, Eye Care, Labs, Blood Sugar Monitoring, Applications, Exercise, Medication, Programmer, applications, Development worker, community  [employer] Implementation of Educational Material.]  Labs Reviewed Hgb A1c  [Reviewed.]  Ship broker, Personal Care Services  [Confirmed Disinterest in Applying for Medicaid or Personal Care Services.]  Mental Health Interventions   Mental Health Discussed/Reviewed Mental Health Discussed, Anxiety, Depression, Grief and Loss, Mental  Health Reviewed, Substance Abuse, Coping Strategies, Suicide, Crisis, Other  [Assessed Mental Health & Cognitive Status.]  Nutrition Interventions   Nutrition Discussed/Reviewed Nutrition Discussed, Adding fruits and vegetables, Increasing proteins, Decreasing fats, Decreasing salt, Fluid intake, Nutrition Reviewed, Carbohydrate meal  planning, Portion sizes, Decreasing sugar intake, Supplemental nutrition  [Encouraged Heart-Healthy, Reduced Fat, Low Sugar Diet.]  Pharmacy Interventions   Pharmacy Dicussed/Reviewed Pharmacy Topics Discussed, Medications and their functions, Pharmacy Topics Reviewed, Medication Adherence, Affording Medications  [Confirmed Ability to Afford Prescription Medications.]  Medication Adherence --  [Confirmed Compliance with Prescription Medications.]  Safety Interventions   Safety Discussed/Reviewed Safety Discussed, Safety Reviewed, Fall Risk, Home Safety  [Encouraged Routine Use of Home Safety Devices & Durable Medical Equipment.]  Home Safety Assistive Devices, Need for home safety assessment, Refer for community resources  [Encouraged Consideration of Home Safety Evaluation.]  Advanced Directive Interventions   Advanced Directives Discussed/Reviewed Advanced Directives Discussed, Advanced Directives Reviewed  [Encouraged Initiation of Advanced Directives (Living Will & Healthcare Power of Corporate treasurer), Offering to [employer], Assist with Completion, Make Copies & Scan into Electronic Medical Record in Epic..]       Active Listening & Reflection Utilized.  Verbalization of Feelings Encouraged.  Emotional Support Provided. Cognitive Behavioral Therapy Performed. Client-Centered Therapy Initiated. Encouraged Routine Engagement with Representative from Aging, Disability [facility] Transit [facility] [place] [phone]), to Check Status of Name on Waiting List to Receive Prepared Meal Delivery Services through Meals on Wheels. Encouraged Engagement with  Danford Bad, Licensed Clinical Social Worker with [facility] [phone]), if You Have Questions, Need Assistance, Additional Social Work Needs Are Identified in The Near Future, of If You Change Your Mind About Wanting to Receive Social Work [employer].       SDOH assessments and interventions completed:  Yes.  Care Coordination Interventions:  Yes, provided.   Follow up plan: No further intervention required.   Encounter Outcome:  Patient Visit Completed.   Danford Bad, BSW, MSW, LCSW [facility], [facility] Clinical Social Worker II Direct Dial: [phone]  Fax: [phone] Website: [email or web address]

## 2023-08-12 ENCOUNTER — Other Ambulatory Visit: Payer: Self-pay | Admitting: Hematology

## 2023-08-12 DIAGNOSIS — C50912 Malignant neoplasm of unspecified site of left female breast: Secondary | ICD-10-CM

## 2023-08-12 DIAGNOSIS — Z95828 Presence of other vascular implants and grafts: Secondary | ICD-10-CM

## 2023-08-29 ENCOUNTER — Other Ambulatory Visit (HOSPITAL_COMMUNITY)
Admission: RE | Admit: 2023-08-29 | Discharge: 2023-08-29 | Disposition: A | Discharge status: Home / Self Care | Source: Ambulatory Visit | Attending: Nephrology | Admitting: Nephrology

## 2023-08-29 DIAGNOSIS — N19 Unspecified kidney failure: Secondary | ICD-10-CM | POA: Diagnosis not present

## 2023-08-29 LAB — CBC
HCT: 33.9 % — ABNORMAL LOW (ref 36.0–46.0)
Hemoglobin: 11.3 g/dL — ABNORMAL LOW (ref 12.0–15.0)
MCH: 31.5 pg (ref 26.0–34.0)
MCHC: 33.3 g/dL (ref 30.0–36.0)
MCV: 94.4 fL (ref 80.0–100.0)
Platelets: 385 10*3/uL (ref 150–400)
RBC: 3.59 MIL/uL — ABNORMAL LOW (ref 3.87–5.11)
RDW: 13.2 % (ref 11.5–15.5)
WBC: 5 10*3/uL (ref 4.0–10.5)
nRBC: 0 % (ref 0.0–0.2)

## 2023-08-29 LAB — IRON AND TIBC
Iron: 106 ug/dL (ref 28–170)
Saturation Ratios: 31 % (ref 10.4–31.8)
TIBC: 338 ug/dL (ref 250–450)
UIBC: 232 ug/dL

## 2023-08-29 LAB — RENAL FUNCTION PANEL
Albumin: 4.2 g/dL (ref 3.5–5.0)
Anion gap: 8 (ref 5–15)
BUN: 16 mg/dL (ref 8–23)
CO2: 27 mmol/L (ref 22–32)
Calcium: 9.6 mg/dL (ref 8.9–10.3)
Chloride: 95 mmol/L — ABNORMAL LOW (ref 98–111)
Creatinine, Ser: 0.52 mg/dL (ref 0.44–1.00)
GFR, Estimated: >60 mL/min (ref >60)
Glucose, Bld: 70 mg/dL (ref 70–99)
Phosphorus: 3.8 mg/dL (ref 2.5–4.6)
Potassium: 4.3 mmol/L (ref 3.5–5.1)
Sodium: 130 mmol/L — ABNORMAL LOW (ref 135–145)

## 2023-08-29 LAB — PROTEIN / CREATININE RATIO, URINE
Creatinine, Urine: 136 mg/dL
Protein Creatinine Ratio: 0.11 mg/mg{creat} (ref 0.00–0.15)
Total Protein, Urine: 15 mg/dL

## 2023-08-29 LAB — FERRITIN: Ferritin: 81 ng/mL (ref 11–307)

## 2023-09-06 DIAGNOSIS — E871 Hypo-osmolality and hyponatremia: Secondary | ICD-10-CM | POA: Diagnosis not present

## 2023-09-06 DIAGNOSIS — D638 Anemia in other chronic diseases classified elsewhere: Secondary | ICD-10-CM | POA: Diagnosis not present

## 2023-09-06 DIAGNOSIS — R809 Proteinuria, unspecified: Secondary | ICD-10-CM | POA: Diagnosis not present

## 2023-09-06 DIAGNOSIS — N181 Chronic kidney disease, stage 1: Secondary | ICD-10-CM | POA: Diagnosis not present

## 2023-10-12 ENCOUNTER — Inpatient Hospital Stay: Payer: 59

## 2023-10-19 ENCOUNTER — Inpatient Hospital Stay: Payer: 59 | Admitting: Hematology

## 2023-11-10 ENCOUNTER — Other Ambulatory Visit: Payer: Self-pay | Admitting: Hematology

## 2023-11-10 DIAGNOSIS — C50912 Malignant neoplasm of unspecified site of left female breast: Secondary | ICD-10-CM

## 2023-11-10 DIAGNOSIS — Z95828 Presence of other vascular implants and grafts: Secondary | ICD-10-CM

## 2023-11-23 ENCOUNTER — Other Ambulatory Visit: Payer: Self-pay

## 2023-11-23 DIAGNOSIS — C50912 Malignant neoplasm of unspecified site of left female breast: Secondary | ICD-10-CM

## 2023-11-24 ENCOUNTER — Inpatient Hospital Stay: Attending: Hematology

## 2023-11-24 DIAGNOSIS — Z170 Estrogen receptor positive status [ER+]: Secondary | ICD-10-CM | POA: Insufficient documentation

## 2023-11-24 DIAGNOSIS — Z800 Family history of malignant neoplasm of digestive organs: Secondary | ICD-10-CM | POA: Diagnosis not present

## 2023-11-24 DIAGNOSIS — C50912 Malignant neoplasm of unspecified site of left female breast: Secondary | ICD-10-CM | POA: Insufficient documentation

## 2023-11-24 DIAGNOSIS — Z808 Family history of malignant neoplasm of other organs or systems: Secondary | ICD-10-CM | POA: Diagnosis not present

## 2023-11-24 DIAGNOSIS — Z79811 Long term (current) use of aromatase inhibitors: Secondary | ICD-10-CM | POA: Diagnosis not present

## 2023-11-24 DIAGNOSIS — M8580 Other specified disorders of bone density and structure, unspecified site: Secondary | ICD-10-CM | POA: Diagnosis not present

## 2023-11-24 DIAGNOSIS — Z79899 Other long term (current) drug therapy: Secondary | ICD-10-CM | POA: Diagnosis not present

## 2023-11-24 DIAGNOSIS — Z87891 Personal history of nicotine dependence: Secondary | ICD-10-CM | POA: Diagnosis not present

## 2023-11-24 DIAGNOSIS — G894 Chronic pain syndrome: Secondary | ICD-10-CM | POA: Diagnosis not present

## 2023-11-24 DIAGNOSIS — Z803 Family history of malignant neoplasm of breast: Secondary | ICD-10-CM | POA: Diagnosis not present

## 2023-11-24 DIAGNOSIS — D649 Anemia, unspecified: Secondary | ICD-10-CM | POA: Insufficient documentation

## 2023-11-24 DIAGNOSIS — Z9012 Acquired absence of left breast and nipple: Secondary | ICD-10-CM | POA: Diagnosis not present

## 2023-11-24 DIAGNOSIS — Z1722 Progesterone receptor negative status: Secondary | ICD-10-CM | POA: Diagnosis not present

## 2023-11-24 LAB — CBC WITH DIFFERENTIAL/PLATELET
Abs Immature Granulocytes: 0.01 10*3/uL (ref 0.00–0.07)
Basophils Absolute: 0 10*3/uL (ref 0.0–0.1)
Basophils Relative: 1 %
Eosinophils Absolute: 0.1 10*3/uL (ref 0.0–0.5)
Eosinophils Relative: 2 %
HCT: 33.8 % — ABNORMAL LOW (ref 36.0–46.0)
Hemoglobin: 11.2 g/dL — ABNORMAL LOW (ref 12.0–15.0)
Immature Granulocytes: 0 %
Lymphocytes Relative: 44 %
Lymphs Abs: 1.6 10*3/uL (ref 0.7–4.0)
MCH: 31.1 pg (ref 26.0–34.0)
MCHC: 33.1 g/dL (ref 30.0–36.0)
MCV: 93.9 fL (ref 80.0–100.0)
Monocytes Absolute: 0.3 10*3/uL (ref 0.1–1.0)
Monocytes Relative: 8 %
Neutro Abs: 1.6 10*3/uL — ABNORMAL LOW (ref 1.7–7.7)
Neutrophils Relative %: 45 %
Platelets: 294 10*3/uL (ref 150–400)
RBC: 3.6 MIL/uL — ABNORMAL LOW (ref 3.87–5.11)
RDW: 12.8 % (ref 11.5–15.5)
WBC: 3.6 10*3/uL — ABNORMAL LOW (ref 4.0–10.5)
nRBC: 0 % (ref 0.0–0.2)

## 2023-11-24 LAB — COMPREHENSIVE METABOLIC PANEL WITH GFR
ALT: 17 U/L (ref 0–44)
AST: 26 U/L (ref 15–41)
Albumin: 4.4 g/dL (ref 3.5–5.0)
Alkaline Phosphatase: 43 U/L (ref 38–126)
Anion gap: 7 (ref 5–15)
BUN: 10 mg/dL (ref 8–23)
CO2: 26 mmol/L (ref 22–32)
Calcium: 9.2 mg/dL (ref 8.9–10.3)
Chloride: 97 mmol/L — ABNORMAL LOW (ref 98–111)
Creatinine, Ser: 0.76 mg/dL (ref 0.44–1.00)
GFR, Estimated: >60 mL/min (ref >60)
Glucose, Bld: 83 mg/dL (ref 70–99)
Potassium: 4.1 mmol/L (ref 3.5–5.1)
Sodium: 130 mmol/L — ABNORMAL LOW (ref 135–145)
Total Bilirubin: 0.5 mg/dL (ref 0.0–1.2)
Total Protein: 6.8 g/dL (ref 6.5–8.1)

## 2023-11-24 LAB — VITAMIN D 25 HYDROXY (VIT D DEFICIENCY, FRACTURES): Vit D, 25-Hydroxy: 88.29 ng/mL (ref 30–100)

## 2023-12-01 ENCOUNTER — Inpatient Hospital Stay: Admitting: Hematology

## 2023-12-01 VITALS — BP 90/56 | HR 66 | Temp 99.3°F | Resp 18 | Wt 112.4 lb

## 2023-12-01 DIAGNOSIS — Z170 Estrogen receptor positive status [ER+]: Secondary | ICD-10-CM

## 2023-12-01 DIAGNOSIS — G894 Chronic pain syndrome: Secondary | ICD-10-CM | POA: Diagnosis not present

## 2023-12-01 DIAGNOSIS — D508 Other iron deficiency anemias: Secondary | ICD-10-CM

## 2023-12-01 DIAGNOSIS — Z9012 Acquired absence of left breast and nipple: Secondary | ICD-10-CM | POA: Diagnosis not present

## 2023-12-01 DIAGNOSIS — Z808 Family history of malignant neoplasm of other organs or systems: Secondary | ICD-10-CM | POA: Diagnosis not present

## 2023-12-01 DIAGNOSIS — Z87891 Personal history of nicotine dependence: Secondary | ICD-10-CM | POA: Diagnosis not present

## 2023-12-01 DIAGNOSIS — Z79899 Other long term (current) drug therapy: Secondary | ICD-10-CM | POA: Diagnosis not present

## 2023-12-01 DIAGNOSIS — Z95828 Presence of other vascular implants and grafts: Secondary | ICD-10-CM

## 2023-12-01 DIAGNOSIS — Z1722 Progesterone receptor negative status: Secondary | ICD-10-CM | POA: Diagnosis not present

## 2023-12-01 DIAGNOSIS — M8580 Other specified disorders of bone density and structure, unspecified site: Secondary | ICD-10-CM

## 2023-12-01 DIAGNOSIS — C50912 Malignant neoplasm of unspecified site of left female breast: Secondary | ICD-10-CM

## 2023-12-01 DIAGNOSIS — D649 Anemia, unspecified: Secondary | ICD-10-CM | POA: Diagnosis not present

## 2023-12-01 DIAGNOSIS — Z800 Family history of malignant neoplasm of digestive organs: Secondary | ICD-10-CM | POA: Diagnosis not present

## 2023-12-01 DIAGNOSIS — Z803 Family history of malignant neoplasm of breast: Secondary | ICD-10-CM | POA: Diagnosis not present

## 2023-12-01 DIAGNOSIS — Z79811 Long term (current) use of aromatase inhibitors: Secondary | ICD-10-CM | POA: Diagnosis not present

## 2023-12-01 MED ORDER — PROCHLORPERAZINE MALEATE 10 MG PO TABS: 10.0000 mg | ORAL_TABLET | Freq: Four times a day (QID) | ORAL | 3 refills | Status: AC | PRN

## 2023-12-01 MED ORDER — GABAPENTIN 300 MG PO CAPS: 300.0000 mg | ORAL_CAPSULE | Freq: Two times a day (BID) | ORAL | 3 refills | Status: AC

## 2023-12-01 MED ORDER — ANASTROZOLE 1 MG PO TABS
1.0000 mg | ORAL_TABLET | Freq: Every day | ORAL | 4 refills | Status: AC
Start: 2023-12-01 — End: ?

## 2023-12-01 NOTE — Progress Notes (Signed)
 [facility] [address] [place] [address]    Clinic Day:  12/01/2023  Referring physician: Lenn Quint, NP  Patient Care Team: Lenn Quint, NP as PCP - General (Internal Medicine) Paulett Boros, MD as Medical Oncologist (Medical Oncology) Gerhard Knuckles, RN as Oncology Nurse Navigator (Oncology) Gwynne Less, Francena Infield, MD as Referring Physician Janita Mellow, MD as Consulting Physician (Otolaryngology)   ASSESSMENT & PLAN:   Assessment: 1. Stage I (T1CN0) left breast IDC, ER positive, HER2/PR negative: - Abnormal mammogram followed by left breast subareolar mass biopsy on 02/24/2021 consistent with DCIS intermediate to high-grade - Left mastectomy and SLNB on 03/23/2021 - Pathology shows 1.5 cm IDC, grade 2, extensive DCIS, margins negative, 0/2 left axillary lymph nodes, PT1CN0.  ER 100%.  PR-negative.  HER2 1+.  Ki-67-25%. - Oncotype DX showed recurrence score 33.  There is more than 15% chemotherapy benefit.  Distant recurrence at 9 years with tamoxifen alone was 21%. - 4 cycles of TC from 05/28/2021 through 08/06/2021. - [facility] genetic testing negative. - Anastrozole  started on 08/27/2021.   2. Social/family history: - She lives by herself.  She is seen with her sister today. 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 - She quit smoking 1 month ago.  Half pack per day for 35 years. - She drinks alcohol 3-5 beers daily. - Father had prostate and pancreatic cancer.  Maternal grandmother and maternal aunt had breast cancer.  Paternal grandfather had colon cancer.  Other people in her family also had cancers, type unknown to the patient.    Plan: 1. Stage I (T1CN0) left breast IDC, ER positive, HER2/PR negative, Oncotype DX 33: - She is tolerating anastrozole  reasonably well. - Physical exam: Left mastectomy site is within normal limits.  No palpable adenopathy or masses in the right breast. - Reviewed labs from  11/24/2023: Normal LFTs.  CBC grossly normal with mild anemia. - Mammogram from 04/13/2023: BI-RADS Category 1. - Continue anastrozole  daily.  RTC 6 months for follow-up with repeat labs.  She complains of fatigue.  She was told to do more physical activity.  She has very mild anemia.  Will check anemia labs at next visit.  2.  Bone health (osteopenia): - DEXA scan on 04/30/2021 with T-score -1.5.  Vitamin D  level is normal at 88.  Continue calcium  and vitamin D  supplements.  Will schedule for repeat DEXA scan prior to next visit.  3.  Chronic pain syndrome: - She will continue gabapentin  300 mg 3 times daily.  Continue pain clinic follow-up.    Orders Placed This Encounter  Procedures   DG Bone Density    Standing Status:   Future    Expected Date:   06/01/2024    Expiration Date:   11/30/2024    Reason for Exam (SYMPTOM  OR DIAGNOSIS REQUIRED):   osteopenia; antiestrogen therapy    Preferred imaging location?:   [facility]   CBC with Differential    Standing Status:   Future    Expected Date:   05/28/2024    Expiration Date:   08/26/2024   Comprehensive metabolic panel    Standing Status:   Future    Expected Date:   05/28/2024    Expiration Date:   08/26/2024   Iron and TIBC ([facility] ONLY)    Standing Status:   Future    Expected Date:   05/28/2024    Expiration Date:   08/26/2024  Ferritin    Standing Status:   Future    Expected Date:   05/28/2024    Expiration Date:   08/26/2024   Vitamin B12    Standing Status:   Future    Expected Date:   05/28/2024    Expiration Date:   08/26/2024   Folate    Standing Status:   Future    Expected Date:   05/28/2024    Expiration Date:   08/26/2024      Hurman Maiden R Teague,acting as a scribe for Paulett Boros, MD.,have documented all relevant documentation on the behalf of Paulett Boros, MD,as directed by  Paulett Boros, MD while in the presence of Paulett Boros, MD.  I, Paulett Boros MD,  have reviewed the above documentation for accuracy and completeness, and I agree with the above.    Paulett Boros, MD   6/12/202512:44 PM  CHIEF COMPLAINT:   Diagnosis: left breast cancer    Cancer Staging  Breast cancer, left [facility]) Staging form: Breast, AJCC 8th Edition - Clinical stage from 04/22/2021: Stage IA (cT1c, cN0, cM0, G2, ER+, PR-, HER2-) - Signed by Paulett Boros, MD on 04/22/2021    Prior Therapy: 1. Left mastectomy and SLNB on 03/23/2021 2. 4 cycles of TC from 05/28/2021 through 08/06/2021.  Current Therapy:  anastrozole    HISTORY OF PRESENT ILLNESS:   Oncology History  Breast cancer, left (HCC)  04/22/2021 Initial Diagnosis   Breast cancer, left (HCC)   04/22/2021 Cancer Staging   Staging form: Breast, AJCC 8th Edition - Clinical stage from 04/22/2021: Stage IA (cT1c, cN0, cM0, G2, ER+, PR-, HER2-) - Signed by Paulett Boros, MD on 04/22/2021 Stage prefix: Initial diagnosis Nuclear grade: G2 Histologic grading system: 3 grade system HER2-IHC interpretation: Negative HER2-IHC value: Score 1+ Ki-67 (%): 25   05/28/2021 - 08/07/2021 Chemotherapy   Patient is on Treatment Plan : BREAST TC q21d      Genetic Testing   Negative genetic testing. No pathogenic variants identified on the [facility] Multi-Cancer+RNA panel. The report date is 05/10/2021.  The Multi-Cancer Panel + RNA offered by [facility] includes sequencing and/or deletion duplication testing of the following 84 genes: AIP, ALK, APC, ATM, AXIN2,BAP1,  BARD1, BLM, BMPR1A, BRCA1, BRCA2, BRIP1, CASR, CDC73, CDH1, CDK4, CDKN1B, CDKN1C, CDKN2A (p14ARF), CDKN2A (p16INK4a), CEBPA, CHEK2, CTNNA1, DICER1, DIS3L2, EGFR (c.2369C>T, p.Thr790Met variant only), EPCAM (Deletion/duplication testing only), FH, FLCN, GATA2, GPC3, GREM1 (Promoter region deletion/duplication testing only), HOXB13 (c.251G>A, p.Gly84Glu), HRAS, KIT, MAX, MEN1, MET, MITF (c.952G>A, p.Glu318Lys variant only), MLH1, MSH2, MSH3, MSH6,  MUTYH, NBN, NF1, NF2, NTHL1, PALB2, PDGFRA, PHOX2B, PMS2, POLD1, POLE, POT1, PRKAR1A, PTCH1, PTEN, RAD50, RAD51C, RAD51D, RB1, RECQL4, RET, RUNX1, SDHAF2, SDHA (sequence changes only), SDHB, SDHC, SDHD, SMAD4, SMARCA4, SMARCB1, SMARCE1, STK11, SUFU, TERC, TERT, TMEM127, TP53, TSC1, TSC2, VHL, WRN and WT1.      INTERVAL HISTORY:   Samantha Mcfarland is a 68 y.o. female presenting to clinic today for follow up of left breast cancer. She was last seen by me on 10/13/22.  Today, she states that she is doing well overall. Her appetite level is at 0%. Her energy level is at 0%. Tiasia is accompanied by a family member.   She denies any new onset pains. She reports feeling tired and would like to know if she can have a medication to improve energy. Verlia ambulates with a cane or walker, as she is not able to walk unassisted without falling. She does exercises in her bed, but is otherwise not very active as it  is difficult for her to leave her apartment.   She would like a refill of anastrozole , compazine , and gabapentin .   PAST MEDICAL HISTORY:   Past Medical History: Past Medical History:  Diagnosis Date   Allergy    Anxiety    Breast cancer (HCC)    Depression    Family history of breast cancer    Family history of colon cancer    Family history of pancreatic cancer    Family history of thyroid  cancer    GERD (gastroesophageal reflux disease)    Hyperlipidemia    Hypertension    Hyponatremia    Osteopenia 04/30/2021   Port-A-Cath in place 05/07/2021   Vitamin D  insufficiency     Surgical History: Past Surgical History:  Procedure Laterality Date   EXCISION OF SKIN TAG Right 05/27/2021   Procedure: EXCISION OF RIGHT SKIN TAG;  Surgeon: Awilda Bogus, MD;  Location: [facility];  Service: General;  Laterality: Right;   MASTECTOMY Left 03/23/2021   MASTECTOMY W/ SENTINEL NODE BIOPSY Left 03/23/2021   Procedure: TOTAL MASTECTOMY WITH SENTINEL LYMPH NODE BIOPSY;  Surgeon: Awilda Bogus, MD;   Location: [facility];  Service: General;  Laterality: Left;   NECK SURGERY     OOPHORECTOMY     PORT-A-CATH REMOVAL Right 09/23/2021   Procedure: MINOR REMOVAL PORT-A-CATH;  Surgeon: Awilda Bogus, MD;  Location: [facility];  Service: General;  Laterality: Right;   PORTACATH PLACEMENT Right 05/27/2021   Procedure: INSERTION PORT-A-CATH;  Surgeon: Awilda Bogus, MD;  Location: [facility];  Service: General;  Laterality: Right;   SALPINGECTOMY      Social History: Social History   Socioeconomic History   Marital status: Divorced    Spouse name: Not on file   Number of children: 0   Years of education: 12   Highest education level: 12th grade  Occupational History   Occupation: retired  Tobacco Use   Smoking status: Former    Current packs/day: 0.25    Types: Cigarettes    Passive exposure: Past   Smokeless tobacco: Never   Tobacco comments:    1 pack every 3-4 days    Offered Smoking Cessation Classes, Services, Agencies & Resoures.  Vaping Use   Vaping status: Never Used  Substance and Sexual Activity   Alcohol use: Not Currently    Alcohol/week: 20.0 standard drinks of alcohol    Types: 20 Cans of beer per week    Comment: 3-4 beers per day   Drug use: Never   Sexual activity: Not Currently    Partners: Male    Birth control/protection: Post-menopausal  Other Topics Concern   Not on file  Social History Narrative   Sister lives about 15 minutes away   She reports claustrophobia- not liking closed in spaces   03/26/22 reports she stopped smoking   Does not have internet Her sister Mariah Shines manages her my chart account    Social Drivers of Corporate investment banker Strain: Low Risk  (05/13/2023)   Overall Financial Resource Strain (CARDIA)    Difficulty of Paying Living Expenses: Not hard at all  Food Insecurity: No Food Insecurity (05/13/2023)   Hunger Vital Sign    Worried About Running Out of Food in the Last Year: Never true    Ran Out of Food in the Last Year:  Never true  Transportation Needs: No Transportation Needs (05/13/2023)   PRAPARE - Administrator, Civil Service (Medical): No  Lack of Transportation (Non-Medical): No  Physical Activity: Inactive (05/13/2023)   Exercise Vital Sign    Days of Exercise per Week: 0 days    Minutes of Exercise per Session: 0 min  Stress: No Stress Concern Present (05/13/2023)   [employer] of Occupational Health - Occupational Stress Questionnaire    Feeling of Stress : Only a little  Social Connections: Moderately Integrated (05/13/2023)   Social Connection and Isolation Panel    Frequency of Communication with Friends and Family: More than three times a week    Frequency of Social Gatherings with Friends and Family: More than three times a week    Attends Religious Services: More than 4 times per year    Active Member of [employer] or Organizations: Yes    Attends Banker Meetings: 1 to 4 times per year    Marital Status: Divorced  Intimate Partner Violence: Not At Risk (05/13/2023)   Humiliation, Afraid, Rape, and Kick questionnaire    Fear of Current or Ex-Partner: No    Emotionally Abused: No    Physically Abused: No    Sexually Abused: No    Family History: Family History  Problem Relation Age of Onset   Stroke Mother    Alcohol abuse Mother    Diabetes Father    Pancreatic cancer Father 59   Testicular cancer Father        dx 30s   Pancreatic disease Father    Breast cancer Maternal Aunt        dx 50s-60s   Breast cancer Paternal Aunt 23   Thyroid  cancer Paternal Uncle    Breast cancer Maternal Grandmother        dx 20s   Colon cancer Paternal Grandfather        dx 26s    Current Medications:  Current Outpatient Medications:    acetaminophen  (TYLENOL ) 325 MG tablet, Take 325 mg by mouth every 6 (six) hours as needed for moderate pain or headache., Disp: , Rfl:    anastrozole  (ARIMIDEX ) 1 MG tablet, Take 1 tablet (1 mg total) by mouth daily., Disp: 90  tablet, Rfl: 4   atorvastatin  (LIPITOR) 20 MG tablet, TAKE ONE TABLET DAILY, Disp: 90 tablet, Rfl: 0   cholecalciferol (VITAMIN D3) 25 MCG (1000 UNIT) tablet, Take 1,000 Units by mouth daily., Disp: , Rfl:    diphenhydrAMINE  HCl (ALLERGY MEDICATION PO), Take 1 tablet by mouth as needed (allergies)., Disp: , Rfl:    folic acid  (FOLVITE ) 1 MG tablet, Take 1 tablet (1 mg total) by mouth daily., Disp: 30 tablet, Rfl: 1   furosemide (LASIX) 20 MG tablet, Take 0.5 tablets by mouth 2 (two) times daily., Disp: , Rfl:    gabapentin  (NEURONTIN ) 300 MG capsule, Take 1 capsule (300 mg total) by mouth 2 (two) times daily., Disp: 60 capsule, Rfl: 3   loperamide  (IMODIUM ) 2 MG capsule, Take 1 capsule (2 mg total) by mouth as needed for diarrhea or loose stools., Disp: 30 capsule, Rfl: 0   Multiple Vitamin (MULTIVITAMIN WITH MINERALS) TABS tablet, Take 1 tablet by mouth daily., Disp: , Rfl:    prochlorperazine  (COMPAZINE ) 10 MG tablet, Take 1 tablet (10 mg total) by mouth every 6 (six) hours as needed for nausea or vomiting., Disp: 30 tablet, Rfl: 3   thiamine  (VITAMIN B-1) 100 MG tablet, Take 1 tablet (100 mg total) by mouth daily., Disp: 30 tablet, Rfl: 1   vitamin B-12 (CYANOCOBALAMIN ) 500 MCG tablet, Take 500 mcg by mouth daily.,  Disp: , Rfl:    VRAYLAR 1.5 MG capsule, Take 1.5 mg by mouth daily., Disp: , Rfl:    Allergies: Allergies  Allergen Reactions   Penicillins Rash and Anaphylaxis   Ibuprofen  Other (See Comments)    Stomach upset    REVIEW OF SYSTEMS:   Review of Systems  Constitutional:  Negative for chills, fatigue and fever.  HENT:   Negative for lump/mass, mouth sores, nosebleeds, sore throat and trouble swallowing.   Eyes:  Negative for eye problems.  Respiratory:  Positive for cough. Negative for shortness of breath.   Cardiovascular:  Negative for chest pain, leg swelling and palpitations.  Gastrointestinal:  Positive for constipation and nausea. Negative for abdominal pain,  diarrhea and vomiting.  Genitourinary:  Positive for difficulty urinating. Negative for bladder incontinence, dysuria, frequency, hematuria and nocturia.   Musculoskeletal:  Negative for arthralgias, back pain, flank pain, myalgias and neck pain.  Skin:  Negative for itching and rash.  Neurological:  Positive for headaches. Negative for dizziness and numbness.       +tingling in feet  Hematological:  Does not bruise/bleed easily.  Psychiatric/Behavioral:  Positive for depression and sleep disturbance. Negative for suicidal ideas. The patient is nervous/anxious.   All other systems reviewed and are negative.    VITALS:   Blood pressure (!) 90/56, pulse 66, temperature 99.3 F (37.4 C), temperature source Oral, resp. rate 18, weight 112 lb 7 oz (51 kg), SpO2 100%.  Wt Readings from Last 3 Encounters:  12/01/23 112 lb 7 oz (51 kg)  11/24/22 122 lb 6.4 oz (55.5 kg)  09/03/22 122 lb (55.3 kg)    Body mass index is 18.15 kg/m.  Performance status (ECOG): 1 - Symptomatic but completely ambulatory  PHYSICAL EXAM:   Physical Exam Vitals and nursing note reviewed. Exam conducted with a chaperone present.  Constitutional:      Appearance: Normal appearance.   Cardiovascular:     Rate and Rhythm: Normal rate and regular rhythm.     Pulses: Normal pulses.     Heart sounds: Normal heart sounds.  Pulmonary:     Effort: Pulmonary effort is normal.     Breath sounds: Normal breath sounds.  Chest:     Comments: +left mastectomy site is within normal limits +no palpable masses in the left axillary region, supraclavicular region, and left breast area Abdominal:     Palpations: Abdomen is soft. There is no hepatomegaly, splenomegaly or mass.     Tenderness: There is no abdominal tenderness.   Musculoskeletal:     Right lower leg: No edema.     Left lower leg: No edema.  Lymphadenopathy:     Cervical: No cervical adenopathy.     Right cervical: No superficial, deep or posterior cervical  adenopathy.    Left cervical: No superficial, deep or posterior cervical adenopathy.     Upper Body:     Right upper body: No supraclavicular or axillary adenopathy.     Left upper body: No supraclavicular or axillary adenopathy.   Neurological:     General: No focal deficit present.     Mental Status: She is alert and oriented to person, place, and time.   Psychiatric:        Mood and Affect: Mood normal.        Behavior: Behavior normal.   Breast Exam Chaperone: Donzell Gallery, RN   LABS:      Latest Ref Rng & Units 11/24/2023    9:40 AM 08/29/2023  11:26 AM 04/13/2023    8:24 AM  CBC  WBC 4.0 - 10.5 K/uL 3.6  5.0  4.9   Hemoglobin 12.0 - 15.0 g/dL 16.1  09.6  04.5   Hematocrit 36.0 - 46.0 % 33.8  33.9  34.6   Platelets 150 - 400 K/uL 294  385  298       Latest Ref Rng & Units 11/24/2023    9:40 AM 08/29/2023   11:26 AM 04/13/2023    8:24 AM  CMP  Glucose 70 - 99 mg/dL 83  70  79   BUN 8 - 23 mg/dL 10  16  12    Creatinine 0.44 - 1.00 mg/dL 4.09  8.11  9.14   Sodium 135 - 145 mmol/L 130  130  136   Potassium 3.5 - 5.1 mmol/L 4.1  4.3  3.4   Chloride 98 - 111 mmol/L 97  95  100   CO2 22 - 32 mmol/L 26  27  28    Calcium  8.9 - 10.3 mg/dL 9.2  9.6  9.1   Total Protein 6.5 - 8.1 g/dL 6.8   6.8   Total Bilirubin 0.0 - 1.2 mg/dL 0.5   0.5   Alkaline Phos 38 - 126 U/L 43   55   AST 15 - 41 U/L 26   26   ALT 0 - 44 U/L 17   16      No results found for: CEA1, CEA / No results found for: CEA1, CEA No results found for: PSA1 No results found for: NWG956 No results found for: CAN125  No results found for: Tanner Fanny, A1GS, A2GS, Herbie Loll, MSPIKE, SPEI Lab Results  Component Value Date   TIBC 338 08/29/2023   TIBC 344 04/22/2021   FERRITIN 81 08/29/2023   FERRITIN 117 03/05/2022   FERRITIN 106 03/04/2022   IRONPCTSAT 31 08/29/2023   IRONPCTSAT 21 04/22/2021   Lab Results  Component Value Date   LDH 118  03/03/2022     STUDIES:   No results found.

## 2023-12-01 NOTE — Patient Instructions (Signed)
 [facility] at [facility] Discharge Instructions   You were seen and examined today by Dr. Cheree Cords.  He reviewed the results of your lab work which are normal/stable.   We will see you back in 6 months. We will repeat lab work and a bone density test prior to this visit.   Return as scheduled.    Thank you for choosing [facility] at [facility] to provide your oncology and hematology care.  To afford each patient quality time with our provider, please arrive at least 15 minutes before your scheduled appointment time.   If you have a lab appointment with the Cancer Center please come in thru the Main Entrance and check in at the main information desk.  You need to re-schedule your appointment should you arrive 10 or more minutes late.  We strive to give you quality time with our providers, and arriving late affects you and other patients whose appointments are after yours.  Also, if you no show three or more times for appointments you may be dismissed from the clinic at the providers discretion.     Again, thank you for choosing [facility].  Our hope is that these requests will decrease the amount of time that you wait before being seen by our physicians.       _____________________________________________________________  Should you have questions after your visit to [facility], please contact our office at [phone] and follow the prompts.  Our office hours are 8:00 a.m. and 4:30 p.m. Monday - Friday.  Please note that voicemails left after 4:00 p.m. may not be returned until the following business day.  We are closed weekends and major holidays.  You do have access to a nurse 24-7, just call the main number to the clinic [phone] and do not press any options, hold on the line and a nurse will answer the phone.    For prescription refill requests, have your pharmacy contact our office and allow 72 hours.     Due to Covid, you will need to wear a mask upon entering the hospital. If you do not have a mask, a mask will be given to you at the Main Entrance upon arrival. For doctor visits, patients may have 1 support person age 4 or older with them. For treatment visits, patients can not have anyone with them due to social distancing guidelines and our immunocompromised population.

## 2023-12-29 DIAGNOSIS — C50919 Malignant neoplasm of unspecified site of unspecified female breast: Secondary | ICD-10-CM | POA: Diagnosis not present

## 2023-12-29 DIAGNOSIS — I10 Essential (primary) hypertension: Secondary | ICD-10-CM | POA: Diagnosis not present

## 2023-12-29 DIAGNOSIS — R296 Repeated falls: Secondary | ICD-10-CM | POA: Diagnosis not present

## 2024-04-20 ENCOUNTER — Other Ambulatory Visit (HOSPITAL_COMMUNITY)
Admission: RE | Admit: 2024-04-20 | Discharge: 2024-04-20 | Disposition: A | Discharge status: Home / Self Care | Source: Ambulatory Visit | Attending: Nephrology | Admitting: Nephrology

## 2024-04-20 DIAGNOSIS — N189 Chronic kidney disease, unspecified: Secondary | ICD-10-CM | POA: Diagnosis present

## 2024-04-20 DIAGNOSIS — D631 Anemia in chronic kidney disease: Secondary | ICD-10-CM | POA: Insufficient documentation

## 2024-04-20 DIAGNOSIS — E871 Hypo-osmolality and hyponatremia: Secondary | ICD-10-CM | POA: Insufficient documentation

## 2024-04-20 DIAGNOSIS — R809 Proteinuria, unspecified: Secondary | ICD-10-CM | POA: Diagnosis present

## 2024-04-20 LAB — RENAL FUNCTION PANEL
Albumin: 4.7 g/dL (ref 3.5–5.0)
Anion gap: 8 (ref 5–15)
BUN: 14 mg/dL (ref 8–23)
CO2: 31 mmol/L (ref 22–32)
Calcium: 9.8 mg/dL (ref 8.9–10.3)
Chloride: 100 mmol/L (ref 98–111)
Creatinine, Ser: 0.82 mg/dL (ref 0.44–1.00)
GFR, Estimated: >60 mL/min (ref >60)
Glucose, Bld: 78 mg/dL (ref 70–99)
Phosphorus: 3.9 mg/dL (ref 2.5–4.6)
Potassium: 4.4 mmol/L (ref 3.5–5.1)
Sodium: 139 mmol/L (ref 135–145)

## 2024-04-20 LAB — OSMOLALITY, URINE: Osmolality, Ur: 592 mosm/kg (ref 300–900)

## 2024-04-20 LAB — CBC
HCT: 34.6 % — ABNORMAL LOW (ref 36.0–46.0)
Hemoglobin: 11.6 g/dL — ABNORMAL LOW (ref 12.0–15.0)
MCH: 32.3 pg (ref 26.0–34.0)
MCHC: 33.5 g/dL (ref 30.0–36.0)
MCV: 96.4 fL (ref 80.0–100.0)
Platelets: 299 K/uL (ref 150–400)
RBC: 3.59 MIL/uL — ABNORMAL LOW (ref 3.87–5.11)
RDW: 12.8 % (ref 11.5–15.5)
WBC: 4.5 K/uL (ref 4.0–10.5)
nRBC: 0 % (ref 0.0–0.2)

## 2024-04-20 LAB — OSMOLALITY: Osmolality: 288 mosm/kg (ref 275–295)

## 2024-04-20 LAB — PROTEIN / CREATININE RATIO, URINE
Creatinine, Urine: 215 mg/dL
Protein Creatinine Ratio: 0.12 mg/mg{creat} (ref 0.00–0.15)
Total Protein, Urine: 25 mg/dL

## 2024-04-20 LAB — SODIUM, URINE, RANDOM: Sodium, Ur: 40 mmol/L

## 2024-05-21 ENCOUNTER — Encounter (INDEPENDENT_AMBULATORY_CARE_PROVIDER_SITE_OTHER): Payer: Self-pay | Admitting: *Deleted

## 2024-05-28 ENCOUNTER — Ambulatory Visit (HOSPITAL_COMMUNITY)
Admission: RE | Admit: 2024-05-28 | Discharge: 2024-05-28 | Disposition: A | Source: Ambulatory Visit | Attending: Hematology | Admitting: Hematology

## 2024-05-28 ENCOUNTER — Inpatient Hospital Stay: Attending: Physician Assistant

## 2024-05-28 ENCOUNTER — Other Ambulatory Visit: Payer: Self-pay

## 2024-05-28 DIAGNOSIS — Z87891 Personal history of nicotine dependence: Secondary | ICD-10-CM | POA: Diagnosis not present

## 2024-05-28 DIAGNOSIS — Z23 Encounter for immunization: Secondary | ICD-10-CM | POA: Insufficient documentation

## 2024-05-28 DIAGNOSIS — Z1732 Human epidermal growth factor receptor 2 negative status: Secondary | ICD-10-CM | POA: Diagnosis not present

## 2024-05-28 DIAGNOSIS — M8580 Other specified disorders of bone density and structure, unspecified site: Secondary | ICD-10-CM | POA: Insufficient documentation

## 2024-05-28 DIAGNOSIS — G620 Drug-induced polyneuropathy: Secondary | ICD-10-CM | POA: Insufficient documentation

## 2024-05-28 DIAGNOSIS — I10 Essential (primary) hypertension: Secondary | ICD-10-CM | POA: Diagnosis not present

## 2024-05-28 DIAGNOSIS — Z800 Family history of malignant neoplasm of digestive organs: Secondary | ICD-10-CM | POA: Diagnosis not present

## 2024-05-28 DIAGNOSIS — Z9079 Acquired absence of other genital organ(s): Secondary | ICD-10-CM | POA: Insufficient documentation

## 2024-05-28 DIAGNOSIS — D539 Nutritional anemia, unspecified: Secondary | ICD-10-CM | POA: Diagnosis not present

## 2024-05-28 DIAGNOSIS — Z808 Family history of malignant neoplasm of other organs or systems: Secondary | ICD-10-CM | POA: Insufficient documentation

## 2024-05-28 DIAGNOSIS — Z9012 Acquired absence of left breast and nipple: Secondary | ICD-10-CM | POA: Insufficient documentation

## 2024-05-28 DIAGNOSIS — Z79899 Other long term (current) drug therapy: Secondary | ICD-10-CM | POA: Insufficient documentation

## 2024-05-28 DIAGNOSIS — Z90721 Acquired absence of ovaries, unilateral: Secondary | ICD-10-CM | POA: Insufficient documentation

## 2024-05-28 DIAGNOSIS — T451X5A Adverse effect of antineoplastic and immunosuppressive drugs, initial encounter: Secondary | ICD-10-CM | POA: Insufficient documentation

## 2024-05-28 DIAGNOSIS — Z170 Estrogen receptor positive status [ER+]: Secondary | ICD-10-CM | POA: Insufficient documentation

## 2024-05-28 DIAGNOSIS — Z79811 Long term (current) use of aromatase inhibitors: Secondary | ICD-10-CM | POA: Diagnosis not present

## 2024-05-28 DIAGNOSIS — D508 Other iron deficiency anemias: Secondary | ICD-10-CM

## 2024-05-28 DIAGNOSIS — C50912 Malignant neoplasm of unspecified site of left female breast: Secondary | ICD-10-CM

## 2024-05-28 DIAGNOSIS — Z1722 Progesterone receptor negative status: Secondary | ICD-10-CM | POA: Diagnosis not present

## 2024-05-28 DIAGNOSIS — C50012 Malignant neoplasm of nipple and areola, left female breast: Secondary | ICD-10-CM | POA: Insufficient documentation

## 2024-05-28 DIAGNOSIS — Z803 Family history of malignant neoplasm of breast: Secondary | ICD-10-CM | POA: Diagnosis not present

## 2024-05-28 LAB — CBC WITH DIFFERENTIAL/PLATELET
Abs Immature Granulocytes: 0.01 K/uL (ref 0.00–0.07)
Basophils Absolute: 0 K/uL (ref 0.0–0.1)
Basophils Relative: 1 %
Eosinophils Absolute: 0.1 K/uL (ref 0.0–0.5)
Eosinophils Relative: 2 %
HCT: 31.8 % — ABNORMAL LOW (ref 36.0–46.0)
Hemoglobin: 10.7 g/dL — ABNORMAL LOW (ref 12.0–15.0)
Immature Granulocytes: 0 %
Lymphocytes Relative: 37 %
Lymphs Abs: 1.6 K/uL (ref 0.7–4.0)
MCH: 32.4 pg (ref 26.0–34.0)
MCHC: 33.6 g/dL (ref 30.0–36.0)
MCV: 96.4 fL (ref 80.0–100.0)
Monocytes Absolute: 0.4 K/uL (ref 0.1–1.0)
Monocytes Relative: 9 %
Neutro Abs: 2.2 K/uL (ref 1.7–7.7)
Neutrophils Relative %: 51 %
Platelets: 266 K/uL (ref 150–400)
RBC: 3.3 MIL/uL — ABNORMAL LOW (ref 3.87–5.11)
RDW: 13 % (ref 11.5–15.5)
WBC: 4.2 K/uL (ref 4.0–10.5)
nRBC: 0 % (ref 0.0–0.2)

## 2024-05-28 LAB — COMPREHENSIVE METABOLIC PANEL WITH GFR
ALT: 15 U/L (ref 0–44)
AST: 30 U/L (ref 15–41)
Albumin: 4.4 g/dL (ref 3.5–5.0)
Alkaline Phosphatase: 50 U/L (ref 38–126)
Anion gap: 4 — ABNORMAL LOW (ref 5–15)
BUN: 13 mg/dL (ref 8–23)
CO2: 33 mmol/L — ABNORMAL HIGH (ref 22–32)
Calcium: 9.4 mg/dL (ref 8.9–10.3)
Chloride: 102 mmol/L (ref 98–111)
Creatinine, Ser: 0.81 mg/dL (ref 0.44–1.00)
GFR, Estimated: >60 mL/min (ref >60)
Glucose, Bld: 89 mg/dL (ref 70–99)
Potassium: 4.4 mmol/L (ref 3.5–5.1)
Sodium: 139 mmol/L (ref 135–145)
Total Bilirubin: 0.3 mg/dL (ref 0.0–1.2)
Total Protein: 6.5 g/dL (ref 6.5–8.1)

## 2024-05-28 LAB — IRON AND TIBC
Iron: 81 ug/dL (ref 28–170)
Saturation Ratios: 26 % (ref 10.4–31.8)
TIBC: 314 ug/dL (ref 250–450)
UIBC: 233 ug/dL

## 2024-05-28 LAB — VITAMIN B12: Vitamin B-12: 1301 pg/mL — ABNORMAL HIGH (ref 180–914)

## 2024-05-28 LAB — FOLATE: Folate: >20 ng/mL (ref >5.9)

## 2024-05-28 LAB — FERRITIN: Ferritin: 97 ng/mL (ref 11–307)

## 2024-05-31 LAB — METHYLMALONIC ACID, SERUM: Methylmalonic Acid, Quantitative: 139 nmol/L (ref 0–378)

## 2024-05-31 NOTE — Progress Notes (Unsigned)
 [facility] [address], [place] [address]   CLINIC:  Medical Oncology/Hematology  PCP:  Renato Dorothey HERO, NP [id number]  [address] [place] [address] [phone]   REASON FOR VISIT:  Follow-up for stage I LEFT breast IDC, ER positive, HER2/PR negative  PRIOR THERAPY: - Left mastectomy and SLNB on 03/23/2021 - 4 cycles of TC from 05/28/2021 through 08/06/2021  CURRENT THERAPY: Anastrozole  (since 08/27/2021)  BRIEF ONCOLOGIC HISTORY:   Oncology History  Breast cancer, left (HCC)  04/22/2021 Initial Diagnosis   Breast cancer, left (HCC)   04/22/2021 Cancer Staging   Staging form: Breast, AJCC 8th Edition - Clinical stage from 04/22/2021: Stage IA (cT1c, cN0, cM0, G2, ER+, PR-, HER2-) - Signed by Rogers Hai, MD on 04/22/2021 Stage prefix: Initial diagnosis Nuclear grade: G2 Histologic grading system: 3 grade system HER2-IHC interpretation: Negative HER2-IHC value: Score 1+ Ki-67 (%): 25   05/28/2021 - 08/07/2021 Chemotherapy   Patient is on Treatment Plan : BREAST TC q21d      Genetic Testing   Negative genetic testing. No pathogenic variants identified on the [facility] Multi-Cancer+RNA panel. The report date is 05/10/2021.  The Multi-Cancer Panel + RNA offered by [facility] includes sequencing and/or deletion duplication testing of the following 84 genes: AIP, ALK, APC, ATM, AXIN2,BAP1,  BARD1, BLM, BMPR1A, BRCA1, BRCA2, BRIP1, CASR, CDC73, CDH1, CDK4, CDKN1B, CDKN1C, CDKN2A (p14ARF), CDKN2A (p16INK4a), CEBPA, CHEK2, CTNNA1, DICER1, DIS3L2, EGFR (c.2369C>T, p.Thr790Met variant only), EPCAM (Deletion/duplication testing only), FH, FLCN, GATA2, GPC3, GREM1 (Promoter region deletion/duplication testing only), HOXB13 (c.251G>A, p.Gly84Glu), HRAS, KIT, MAX, MEN1, MET, MITF (c.952G>A, p.Glu318Lys variant only), MLH1, MSH2, MSH3, MSH6, MUTYH, NBN, NF1, NF2, NTHL1, PALB2, PDGFRA, PHOX2B, PMS2, POLD1, POLE, POT1, PRKAR1A, PTCH1, PTEN, RAD50, RAD51C, RAD51D, RB1,  RECQL4, RET, RUNX1, SDHAF2, SDHA (sequence changes only), SDHB, SDHC, SDHD, SMAD4, SMARCA4, SMARCB1, SMARCE1, STK11, SUFU, TERC, TERT, TMEM127, TP53, TSC1, TSC2, VHL, WRN and WT1.     CANCER STAGING: Cancer Staging  Breast cancer, left [facility]) Staging form: Breast, AJCC 8th Edition - Clinical stage from 04/22/2021: Stage IA (cT1c, cN0, cM0, G2, ER+, PR-, HER2-) - Signed by Rogers Hai, MD on 04/22/2021   INTERVAL HISTORY:   [name], a 68 y.o. female, returns for routine follow-up of her stage I left-sided breast cancer.  Zosia was last seen on 12/01/2023 by Dr. Rogers.  She is seen with her caregiver Merwyn) today.  In the interim since last visit, she has not had any surgeries, hospitalizations, or changes in baseline health status.   At today's visit, she reports feeling fair apart from ongoing fatigue.  She  reports little to no energy and 25% appetite.   She  is maintaining stable weight at this time.  LEFT BREAST CANCER: No new onset breast lumps or axillary lymphadenopathy. Denies any new aches or pains, headaches, or abdominal pain. She continues to take Arimidex , tolerating well apart from occasional hot flashes. Her chemotherapy-induced neuropathy and leg pain is stable.  She remains on gabapentin  300 mg twice daily PRN for shooting pain in her legs.  OSTEOPENIA: She takes calcium  and vitamin D  daily. No new bone pain, fractures, jaw pain, dental issues.  She reports regular dental follow-up.    ANEMIA: She has ongoing fatigue.   She denies any obvious rectal bleeding or melena. No ice pica, lightheadedness, syncope, chest pain, dyspnea on exertion  She has a history of moderate alcohol consumption (3-4 beers daily), but has cut back to drinking about  half a beer each day.  ASSESSMENT & PLAN:  1.  Stage I (T1CN0) left breast IDC, ER positive, HER2/PR negative, s/p LEFT MASTECTOMY: - Abnormal mammogram followed by left breast subareolar mass biopsy on  02/24/2021 consistent with DCIS intermediate to high-grade - Left mastectomy and SLNB on 03/23/2021 - Pathology shows 1.5 cm IDC, grade 2, extensive DCIS, margins negative, 0/2 left axillary lymph nodes, PT1CN0.  ER 100%.  PR-negative.  HER2 1+.  Ki-67-25%. - Oncotype DX showed recurrence score 33.  There is more than 15% chemotherapy benefit.  Distant recurrence at 9 years with tamoxifen alone was 21%. - 4 cycles of TC from 05/28/2021 through 08/06/2021. - [facility] genetic testing negative. - Anastrozole  started on 08/27/2021.  - She is tolerating anastrozole  reasonably well. - Most recent right breast mammogram (04/13/2023): BI-RADS Category 1, negative. - Physical exam (06/04/2024): Left mastectomy site is within normal limits.  No palpable adenopathy or masses in the right breast. - Most recent labs (05/28/2024): Normal LFTs. -No red flag symptoms per patient history  - PLAN: Continue anastrozole  daily.  Will send for BCI testing at year 4 (March 2027) - RTC in 6 months for labs and physical exam. - She is overdue for mammogram (due October 2025).  2.   Bone health (osteopenia): - DEXA scan on 04/30/2021 with T-score -1.5 (right femur neck) - Bone density/DEXA (05/28/2024): T-score -2.0 (left total hip).  FRAX score indicates 8.2% risk of major osteoporotic fracture and 2.0% risk of hip fracture within the next 10 years. - Labs (05/28/2024) show normal calcium  9.4.  Vitamin D  not checked, but was 88.29 at visit on 11/24/2023. - She is taking calcium  and vitamin D  supplements  - PLAN: Discussed treatment options for worsening osteopenia in the setting of aromatase inhibitor use.  Recommended Prolia every 6 months, and discussed potential risks and side effects, as well as the need for regular dental care while on Prolia.   Patient is agreeable to proceed and denies any active dental issues. - Continue calcium  and vitamin D  daily.  Recheck at 73-month follow-up. - Repeat bone density/DEXA due December  2027.    3.  Chemotherapy-induced pain and neuropathy: - She started having taxane induced leg pain during chemotherapy - She was started on gabapentin  300 mg twice daily.  Originally prescribed by Dr. Katragadda, but currently being prescribed by PCP Ofilia Cassette, NP)  4.  Normocytic anemia, mild - Mild normocytic to macrocytic anemia has been present intermittently since at least November 2022 (no prior records available).  Hgb ranging from 10.0-13.0. - Patient reports that she had anemia as a child.  She is unsure of any family history of anemia, sickle cell, or thalassemia. - No EGD/colonoscopy on record, but was recently referred to gastroenterology by her PCP.  Upcoming appoint with Dr. Cinderella on 06/11/2024. - History of moderate alcohol consumption (3-4 beers daily), but has cut back to drinking about half a beer each day. - Most recent labs (05/28/2024): Hgb 10.7/MCV 96.4 Ferritin 97, iron saturation 26% Creatinine 0.81.  Normal T. bili. Normal B12, folate. - Denies any rectal bleeding or melena.   - PLAN:  Continue GI follow-up as scheduled by PCP.   -- Start taking iron pill twice weekly. - Additional labs to include copper , vitamin B1, vitamin B6, reticulocytes, LDH, Cystatin C, SPEP, light chains, immunofixation.  Given her history of childhood anemia, we will also check hemoglobinopathy cascade.  If all of the above are normal, will check alpha thalassemia genotyping. -  Encouraged alcohol cessation.  She may have some bone marrow suppression secondary to alcohol use - Continue surveillance, pending results above. - RTC 6 months.  (Recommended 9-month follow-up due to worsening anemia, but patient declined and wants to keep follow-up at 6 months.  If she has worsening anemia noted by another provider, she will notify us  to come back sooner).  Will add additional labs as needed, based on the above labs.  5.  Social/family history: - She lives by herself.   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 - She quit smoking 1 month ago.  Half pack per day for 35 years. - She drinks alcohol 3-5 beers daily. - Father had prostate and pancreatic cancer.  Maternal grandmother and maternal aunt had breast cancer.  Paternal grandfather had colon cancer.  Other people in her family also had cancers, type unknown to the patient.   PLAN SUMMARY: >> Start Prolia injections - next Monday is requested >> Screening mammogram ASAP (unilateral right breast screening mammogram overdue from October 2025) >> Labs TODAY = Copper , vitamin B1, vitamin B6, reticulocytes, LDH, Cystatin C, SPEP, light chains, immunofixation, vitamin D , hemoglobin fractionation cascade >> Labs in 6 months = CBC/D, ferritin, iron/TIBC, CMP, Vitamin D  >> OFFICE visit in 6 months (1 week after labs)    REVIEW OF SYSTEMS:   Review of Systems  Constitutional:  Positive for fatigue. Negative for appetite change, chills, diaphoresis, fever and unexpected weight change.  HENT:   Negative for lump/mass and nosebleeds.   Eyes:  Negative for eye problems.  Respiratory:  Negative for cough, hemoptysis and shortness of breath.   Cardiovascular:  Negative for chest pain, leg swelling and palpitations.  Gastrointestinal:  Positive for constipation and nausea. Negative for abdominal pain, blood in stool, diarrhea and vomiting.  Genitourinary:  Negative for hematuria.   Skin: Negative.   Neurological:  Positive for headaches. Negative for dizziness and light-headedness.  Hematological:  Does not bruise/bleed easily.    PHYSICAL EXAM:   Performance status (ECOG): 2 - Symptomatic, <50% confined to bed  There were no vitals filed for this visit. Wt Readings from Last 3 Encounters:  12/01/23 112 lb 7 oz (51 kg)  11/24/22 122 lb 6.4 oz (55.5 kg)  09/03/22 122 lb (55.3 kg)   Physical Exam Constitutional:      Appearance: Normal appearance. She is normal weight.  Cardiovascular:     Heart  sounds: Normal heart sounds.  Pulmonary:     Breath sounds: Normal breath sounds.  Chest:  Breasts:    Right: Normal.     Left: Absent.     Comments: Breast exam (06/04/2024): Left mastectomy site is within normal limits.  No palpable adenopathy or masses in the right breast. Neurological:     General: No focal deficit present.     Mental Status: Mental status is at baseline.  Psychiatric:        Behavior: Behavior normal. Behavior is cooperative.      PAST MEDICAL/SURGICAL HISTORY:  Past Medical History:  Diagnosis Date   Allergy    Anxiety    Breast cancer (HCC)    Depression    Family history of breast cancer    Family history of colon cancer    Family history of pancreatic cancer    Family history of thyroid  cancer    GERD (gastroesophageal reflux disease)    Hyperlipidemia    Hypertension    Hyponatremia    Osteopenia 04/30/2021  Port-A-Cath in place 05/07/2021   Vitamin D  insufficiency    Past Surgical History:  Procedure Laterality Date   EXCISION OF SKIN TAG Right 05/27/2021   Procedure: EXCISION OF RIGHT SKIN TAG;  Surgeon: Kallie Manuelita BROCKS, MD;  Location: [facility];  Service: General;  Laterality: Right;   MASTECTOMY Left 03/23/2021   MASTECTOMY W/ SENTINEL NODE BIOPSY Left 03/23/2021   Procedure: TOTAL MASTECTOMY WITH SENTINEL LYMPH NODE BIOPSY;  Surgeon: Kallie Manuelita BROCKS, MD;  Location: [facility];  Service: General;  Laterality: Left;   NECK SURGERY     OOPHORECTOMY     PORT-A-CATH REMOVAL Right 09/23/2021   Procedure: MINOR REMOVAL PORT-A-CATH;  Surgeon: Kallie Manuelita BROCKS, MD;  Location: [facility];  Service: General;  Laterality: Right;   PORTACATH PLACEMENT Right 05/27/2021   Procedure: INSERTION PORT-A-CATH;  Surgeon: Kallie Manuelita BROCKS, MD;  Location: [facility];  Service: General;  Laterality: Right;   SALPINGECTOMY      SOCIAL HISTORY:  Social History   Socioeconomic History   Marital status: Divorced    Spouse name: Not on file   Number of  children: 0   Years of education: 12   Highest education level: 12th grade  Occupational History   Occupation: retired  Tobacco Use   Smoking status: Former    Current packs/day: 0.25    Types: Cigarettes    Passive exposure: Past   Smokeless tobacco: Never   Tobacco comments:    1 pack every 3-4 days    Offered Smoking Cessation Classes, Services, Agencies & Resoures.  Vaping Use   Vaping status: Never Used  Substance and Sexual Activity   Alcohol use: Not Currently    Alcohol/week: 20.0 standard drinks of alcohol    Types: 20 Cans of beer per week    Comment: 3-4 beers per day   Drug use: Never   Sexual activity: Not Currently    Partners: Male    Birth control/protection: Post-menopausal  Other Topics Concern   Not on file  Social History Narrative   Sister lives about 15 minutes away   She reports claustrophobia- not liking closed in spaces   03/26/22 reports she stopped smoking   Does not have internet Her sister Darice manages her my chart account    Social Drivers of Health   Tobacco Use: Medium Risk (05/13/2023)   Patient History    Smoking Tobacco Use: Former    Smokeless Tobacco Use: Never    Passive Exposure: Past  Physicist, Medical Strain: Low Risk (05/13/2023)   Overall Financial Resource Strain (CARDIA)    Difficulty of Paying Living Expenses: Not hard at all  Food Insecurity: No Food Insecurity (05/13/2023)   Hunger Vital Sign    Worried About Running Out of Food in the Last Year: Never true    Ran Out of Food in the Last Year: Never true  Transportation Needs: No Transportation Needs (05/13/2023)   PRAPARE - Administrator, Civil Service (Medical): No    Lack of Transportation (Non-Medical): No  Physical Activity: Inactive (05/13/2023)   Exercise Vital Sign    Days of Exercise per Week: 0 days    Minutes of Exercise per Session: 0 min  Stress: No Stress Concern Present (05/13/2023)   [employer] of Occupational Health -  Occupational Stress Questionnaire    Feeling of Stress : Only a little  Social Connections: Moderately Integrated (05/13/2023)   Social Connection and Isolation Panel    Frequency of Communication with Friends  and Family: More than three times a week    Frequency of Social Gatherings with Friends and Family: More than three times a week    Attends Religious Services: More than 4 times per year    Active Member of Clubs or Organizations: Yes    Attends Banker Meetings: 1 to 4 times per year    Marital Status: Divorced  Intimate Partner Violence: Not At Risk (05/13/2023)   Humiliation, Afraid, Rape, and Kick questionnaire    Fear of Current or Ex-Partner: No    Emotionally Abused: No    Physically Abused: No    Sexually Abused: No  Depression (PHQ2-9): Low Risk (05/13/2023)   Depression (PHQ2-9)    PHQ-2 Score: 0  Alcohol Screen: Low Risk (05/13/2023)   Alcohol Screen    Last Alcohol Screening Score (AUDIT): 4  Housing: Low Risk (05/13/2023)   Housing    Last Housing Risk Score: 0  Utilities: Not At Risk (05/13/2023)   AHC Utilities    Threatened with loss of utilities: No  Health Literacy: Adequate Health Literacy (05/13/2023)   B1300 Health Literacy    Frequency of need for help with medical instructions: Never    FAMILY HISTORY:  Family History  Problem Relation Age of Onset   Stroke Mother    Alcohol abuse Mother    Diabetes Father    Pancreatic cancer Father 73   Testicular cancer Father        dx 30s   Pancreatic disease Father    Breast cancer Maternal Aunt        dx 50s-60s   Breast cancer Paternal Aunt 39   Thyroid  cancer Paternal Uncle    Breast cancer Maternal Grandmother        dx 54s   Colon cancer Paternal Grandfather        dx 57s    CURRENT MEDICATIONS:  Current Outpatient Medications  Medication Sig Dispense Refill   acetaminophen  (TYLENOL ) 325 MG tablet Take 325 mg by mouth every 6 (six) hours as needed for moderate pain or  headache.     anastrozole  (ARIMIDEX ) 1 MG tablet Take 1 tablet (1 mg total) by mouth daily. 90 tablet 4   atorvastatin  (LIPITOR) 20 MG tablet TAKE ONE TABLET DAILY 90 tablet 0   cholecalciferol (VITAMIN D3) 25 MCG (1000 UNIT) tablet Take 1,000 Units by mouth daily.     diphenhydrAMINE  HCl (ALLERGY MEDICATION PO) Take 1 tablet by mouth as needed (allergies).     folic acid  (FOLVITE ) 1 MG tablet Take 1 tablet (1 mg total) by mouth daily. 30 tablet 1   furosemide (LASIX) 20 MG tablet Take 0.5 tablets by mouth 2 (two) times daily.     gabapentin  (NEURONTIN ) 300 MG capsule Take 1 capsule (300 mg total) by mouth 2 (two) times daily. 60 capsule 3   loperamide  (IMODIUM ) 2 MG capsule Take 1 capsule (2 mg total) by mouth as needed for diarrhea or loose stools. 30 capsule 0   Multiple Vitamin (MULTIVITAMIN WITH MINERALS) TABS tablet Take 1 tablet by mouth daily.     prochlorperazine  (COMPAZINE ) 10 MG tablet Take 1 tablet (10 mg total) by mouth every 6 (six) hours as needed for nausea or vomiting. 30 tablet 3   thiamine  (VITAMIN B-1) 100 MG tablet Take 1 tablet (100 mg total) by mouth daily. 30 tablet 1   vitamin B-12 (CYANOCOBALAMIN ) 500 MCG tablet Take 500 mcg by mouth daily.     VRAYLAR 1.5 MG capsule Take  1.5 mg by mouth daily.     No current facility-administered medications for this visit.    ALLERGIES:  Allergies[1]  LABORATORY DATA:  I have reviewed the labs as listed.     Latest Ref Rng & Units 05/28/2024   10:29 AM 04/20/2024    1:24 PM 11/24/2023    9:40 AM  CBC  WBC 4.0 - 10.5 K/uL 4.2  4.5  3.6   Hemoglobin 12.0 - 15.0 g/dL 89.2  88.3  88.7   Hematocrit 36.0 - 46.0 % 31.8  34.6  33.8   Platelets 150 - 400 K/uL 266  299  294       Latest Ref Rng & Units 05/28/2024   10:29 AM 04/20/2024    1:23 PM 11/24/2023    9:40 AM  CMP  Glucose 70 - 99 mg/dL 89  78  83   BUN 8 - 23 mg/dL 13  14  10    Creatinine 0.44 - 1.00 mg/dL 9.18  9.17  9.23   Sodium 135 - 145 mmol/L 139  139  130    Potassium 3.5 - 5.1 mmol/L 4.4  4.4  4.1   Chloride 98 - 111 mmol/L 102  100  97   CO2 22 - 32 mmol/L 33  31  26   Calcium  8.9 - 10.3 mg/dL 9.4  9.8  9.2   Total Protein 6.5 - 8.1 g/dL 6.5   6.8   Total Bilirubin 0.0 - 1.2 mg/dL 0.3   0.5   Alkaline Phos 38 - 126 U/L 50   43   AST 15 - 41 U/L 30   26   ALT 0 - 44 U/L 15   17     DIAGNOSTIC IMAGING:  I have independently reviewed the scans and discussed with the patient. DG Bone Density Result Date: 05/29/2024 EXAM: DUAL X-RAY ABSORPTIOMETRY (DXA) FOR BONE MINERAL DENSITY 05/28/2024 10:56 am CLINICAL DATA:  68 year old Female Postmenopausal. Osteopenia; antiestrogen therapy Patient is or has been on glucocorticoid therapy. TECHNIQUE: An axial (e.g., hips, spine) and/or appendicular (e.g., radius) exam was performed, as appropriate, using [employer] Psychologist[facility] [employer] [facility] Images are obtained for bone mineral density measurement and are not obtained for diagnostic purposes. [id number] Exclusions: Lumbar spine due to advanced degenerative changes. COMPARISON:  04/30/2021. FINDINGS: Scan quality: Good. LEFT FEMORAL NECK: BMD (in g/cm2): 0.825 T-score: -1.5 Z-score: 0.1 LEFT TOTAL HIP: BMD (in g/cm2): 0.753 T-score: -2.0 Z-score: -0.7 RIGHT FEMORAL NECK: BMD (in g/cm2): 0.822 T-score: -1.6 Z-score: 0.0 RIGHT TOTAL HIP: BMD (in g/cm2): 0.810 T-score: -1.6 Z-score: -0.2 DUAL-FEMUR TOTAL MEAN: Rate of change from previous exam: -6.2 % LEFT FOREARM (RADIUS 33%): BMD (in g/cm2): 0.589 T-score: -1.6 Z-score: 0.0 Rate of change from previous exam: No significant rate of change from previous exam. FRAX 10-YEAR PROBABILITY OF FRACTURE: 10-year fracture risk is performed using the [facility] calculator based on patient-reported risk factors. Major osteoporotic fracture: 8.2% Hip fracture: 2.0% Other situations known to alter the reliability of the FRAX score should be considered when making treatment decisions, including chronic  glucocorticoid use and past treatments. Further guidance on treatment can be found at the [facility]'s website https://www.patton.com/. IMPRESSION: Osteopenia based on BMD. Fracture risk is increased. Increased risk is based on low BMD. RECOMMENDATIONS: 1. All patients should optimize calcium  and vitamin D  intake. 2. Consider FDA-approved medical therapies in postmenopausal women and men aged 53 years and older, based on the following: - A  hip or vertebral (clinical or morphometric) fracture - T-score less than or equal to -2.5 and secondary causes have been excluded. - Low bone mass (T-score between -1.0 and -2.5) and a 10-year probability of a hip fracture greater than or equal to 3% or a 10-year probability of a major osteoporosis-related fracture greater than or equal to 20% based on the [place] -adapted WHO algorithm. - Clinician judgment and/or patient preferences may indicate treatment for people with 10-year fracture probabilities above or below these levels 3. Patients with diagnosis of osteoporosis or at high risk for fracture should have regular bone mineral density tests. For patients eligible for Medicare, routine testing is allowed once every 2 years. The testing frequency can be increased to one year for patients who have rapidly progressing disease, those who are receiving or discontinuing medical therapy to restore bone mass, or have additional risk factors. Electronically Signed   By: Alm Parkins M.D.   On: 05/29/2024 07:33     WRAP UP:  All questions were answered. The patient knows to call the clinic with any problems, questions or concerns.  Medical decision making: Moderate  Time spent on visit: I spent 20 minutes counseling the patient face to face. The total time spent in the appointment was 30 minutes and more than 50% was on counseling.  Pleasant CHRISTELLA Barefoot, PA-C  06/04/2024 2:18 PM      [1]  Allergies Allergen Reactions   Penicillins Rash and Anaphylaxis    Ibuprofen  Other (See Comments)    Stomach upset

## 2024-06-04 ENCOUNTER — Inpatient Hospital Stay

## 2024-06-04 ENCOUNTER — Inpatient Hospital Stay: Admitting: Physician Assistant

## 2024-06-04 ENCOUNTER — Encounter: Payer: Self-pay | Admitting: Physician Assistant

## 2024-06-04 VITALS — BP 109/72 | HR 74 | Temp 98.2°F | Resp 20 | Wt 117.3 lb

## 2024-06-04 DIAGNOSIS — M8580 Other specified disorders of bone density and structure, unspecified site: Secondary | ICD-10-CM

## 2024-06-04 DIAGNOSIS — C50912 Malignant neoplasm of unspecified site of left female breast: Secondary | ICD-10-CM

## 2024-06-04 DIAGNOSIS — Z170 Estrogen receptor positive status [ER+]: Secondary | ICD-10-CM

## 2024-06-04 DIAGNOSIS — D539 Nutritional anemia, unspecified: Secondary | ICD-10-CM

## 2024-06-04 DIAGNOSIS — Z23 Encounter for immunization: Secondary | ICD-10-CM

## 2024-06-04 LAB — LACTATE DEHYDROGENASE: LDH: 227 U/L (ref 105–235)

## 2024-06-04 LAB — RETICULOCYTES
Immature Retic Fract: 10 % (ref 2.3–15.9)
RBC.: 3.58 MIL/uL — ABNORMAL LOW (ref 3.87–5.11)
Retic Count, Absolute: 40.1 K/uL (ref 19.0–186.0)
Retic Ct Pct: 1.1 % (ref 0.4–3.1)

## 2024-06-04 LAB — VITAMIN D 25 HYDROXY (VIT D DEFICIENCY, FRACTURES): Vit D, 25-Hydroxy: 64.92 ng/mL (ref 30–100)

## 2024-06-04 MED ORDER — INFLUENZA VAC SPLIT HIGH-DOSE 0.5 ML IM SUSY
0.5000 mL | PREFILLED_SYRINGE | Freq: Once | INTRAMUSCULAR | Status: AC
  Administered 2024-06-04: 14:00:00 0.5 mL via INTRAMUSCULAR
  Filled 2024-06-04: qty 0.5

## 2024-06-04 NOTE — Patient Instructions (Addendum)
 [facility] at [facility] **VISIT SUMMARY & IMPORTANT INSTRUCTIONS **   You were seen today by Samantha Barefoot PA-C for your follow-up visit.    HISTORY OF LEFT BREAST CANCER Your most recent labs and physical exam did not show any evidence of recurrent cancer. However, you are overdue for your right breast mammogram, which was due around October 2025.  We will schedule you for this to be done in the next month. Continue taking anastrozole  (breast cancer pill) once daily.  OSTEOPENIA  Your osteopenia (weak and fragile bones) is related to age and postmenopausal status, as well as your anastrozole  (Arimidex ) pill which you are taking for treatment of your breast cancer. In order to prevent any worsening of your bone density or progression to osteoporosis, I recommend treatment with Prolia (denosumab) injections, which are given every 6 months here at the [facility].  Please see the attached handout for additional information about Prolia, including potential side effects and the risk of rare but serious events such as osteonecrosis of the jaw or atypical femur fracture. Continue calcium  and vitamin D  supplement daily. Continue weightbearing exercises, which can help to strengthen your bones. While you are taking Prolia, make sure that you see your dentist every 6 months.  Please let your dentist know that you are taking Prolia.  ANEMIA: You have mild anemia, but we do not yet know why you have mild anemia. Start taking iron tablet twice weekly. Your primary care doctor has referred you to see a gastroenterologist (Dr. Cinderella).  This is most likely to make sure you do not have any blood loss from your stomach or intestines. We will check some additional labs to look for vitamin and mineral deficiencies, as well as other conditions that can cause anemia. Alcohol consumption can also cause anemia.  It is important that you completely stop drinking all alcohol, to see if  this will help your blood count to get better.  FOLLOW-UP APPOINTMENT: 6 months  ** Thank you for trusting me with your healthcare!  I strive to provide all of my patients with quality care at each visit.  If you receive a survey for this visit, I would be so grateful to you for taking the time to provide feedback.  Thank you in advance!  ~ Mrytle Bento                                        Dr. Mickiel Davonna Samantha Barefoot, PA-C          Delon Hope, NP   - - - - - - - - - - - - - - - - - -    Thank you for choosing [facility] at [facility] to provide your oncology and hematology care.  To afford each patient quality time with our provider, please arrive at least 15 minutes before your scheduled appointment time.   If you have a lab appointment with the Cancer Center please come in thru the Main Entrance and check in at the main information desk.  You need to re-schedule your appointment should you arrive 10 or more minutes late.  We strive to give you quality time with our providers, and arriving late affects you and other patients whose appointments are after yours.  Also, if you no show three or more  times for appointments you may be dismissed from the clinic at the providers discretion.     Again, thank you for choosing [facility].  Our hope is that these requests will decrease the amount of time that you wait before being seen by our physicians.       _____________________________________________________________  Should you have questions after your visit to [facility], please contact our office at [phone] and follow the prompts.  Our office hours are 8:00 a.m. and 4:30 p.m. Monday - Friday.  Please note that voicemails left after 4:00 p.m. may not be returned until the following business day.  We are closed weekends and major holidays.  You do have access to a nurse 24-7, just call the main number to the clinic  [phone] and do not press any options, hold on the line and a nurse will answer the phone.    For prescription refill requests, have your pharmacy contact our office and allow 72 hours.

## 2024-06-05 ENCOUNTER — Inpatient Hospital Stay: Admitting: Oncology

## 2024-06-05 ENCOUNTER — Inpatient Hospital Stay

## 2024-06-05 LAB — PROTEIN ELECTROPHORESIS, SERUM
A/G Ratio: 1.6 (ref 0.7–1.7)
Albumin ELP: 4.1 g/dL (ref 2.9–4.4)
Alpha-1-Globulin: 0.2 g/dL (ref 0.0–0.4)
Alpha-2-Globulin: 0.6 g/dL (ref 0.4–1.0)
Beta Globulin: 0.9 g/dL (ref 0.7–1.3)
Gamma Globulin: 0.8 g/dL (ref 0.4–1.8)
Globulin, Total: 2.6 g/dL (ref 2.2–3.9)
Total Protein ELP: 6.7 g/dL (ref 6.0–8.5)

## 2024-06-05 LAB — MISC LABCORP TEST (SEND OUT): LabCorp test name: 121251

## 2024-06-05 LAB — KAPPA/LAMBDA LIGHT CHAINS
Kappa free light chain: 17.3 mg/L (ref 3.3–19.4)
Kappa, lambda light chain ratio: 1.53 (ref 0.26–1.65)
Lambda free light chains: 11.3 mg/L (ref 5.7–26.3)

## 2024-06-06 LAB — VITAMIN B6: Vitamin B6: 11 ug/L (ref 3.4–65.2)

## 2024-06-06 LAB — HGB FRACTIONATION CASCADE
Hgb A2: 2.5 % (ref 1.8–3.2)
Hgb A: 97.5 % (ref 96.4–98.8)
Hgb F: 0 % (ref 0.0–2.0)
Hgb S: 0 %

## 2024-06-06 LAB — COPPER, SERUM: Copper: 129 ug/dL (ref 80–158)

## 2024-06-07 LAB — VITAMIN B1: Vitamin B1 (Thiamine): 72.6 nmol/L (ref 66.5–200.0)

## 2024-06-08 LAB — IMMUNOFIXATION ELECTROPHORESIS
IgA: 75 mg/dL — ABNORMAL LOW (ref 87–352)
IgG (Immunoglobin G), Serum: 797 mg/dL (ref 586–1602)
IgM (Immunoglobulin M), Srm: 75 mg/dL (ref 26–217)
Total Protein ELP: 6.5 g/dL (ref 6.0–8.5)

## 2024-06-11 ENCOUNTER — Inpatient Hospital Stay

## 2024-06-11 ENCOUNTER — Ambulatory Visit (INDEPENDENT_AMBULATORY_CARE_PROVIDER_SITE_OTHER): Admitting: Gastroenterology

## 2024-06-11 ENCOUNTER — Encounter (INDEPENDENT_AMBULATORY_CARE_PROVIDER_SITE_OTHER): Payer: Self-pay | Admitting: Gastroenterology

## 2024-06-11 VITALS — BP 96/61 | HR 77 | Temp 97.2°F | Ht 66.0 in | Wt 117.5 lb

## 2024-06-11 VITALS — BP 97/63 | HR 78 | Resp 18

## 2024-06-11 DIAGNOSIS — M8580 Other specified disorders of bone density and structure, unspecified site: Secondary | ICD-10-CM

## 2024-06-11 DIAGNOSIS — D539 Nutritional anemia, unspecified: Secondary | ICD-10-CM | POA: Diagnosis not present

## 2024-06-11 DIAGNOSIS — D500 Iron deficiency anemia secondary to blood loss (chronic): Secondary | ICD-10-CM | POA: Insufficient documentation

## 2024-06-11 DIAGNOSIS — Z7141 Alcohol abuse counseling and surveillance of alcoholic: Secondary | ICD-10-CM | POA: Insufficient documentation

## 2024-06-11 DIAGNOSIS — F1090 Alcohol use, unspecified, uncomplicated: Secondary | ICD-10-CM | POA: Diagnosis not present

## 2024-06-11 MED ORDER — DENOSUMAB 60 MG/ML ~~LOC~~ SOSY
60.0000 mg | PREFILLED_SYRINGE | Freq: Once | SUBCUTANEOUS | Status: AC
  Administered 2024-06-11: 60 mg via SUBCUTANEOUS
  Filled 2024-06-11: qty 1

## 2024-06-11 NOTE — Patient Instructions (Signed)
 It was very nice to meet you today, as dicussed with will plan for the following :  1) Upper endoscopy and colonoscopy

## 2024-06-11 NOTE — Patient Instructions (Addendum)
 Denosumab  Injection (Osteoporosis) What is this medication? DENOSUMAB  (den oh SUE mab) prevents and treats osteoporosis. It works by interior and spatial designer stronger and less likely to break (fracture). It is a monoclonal antibody. This medicine may be used for other purposes; ask your health care provider or pharmacist if you have questions. COMMON BRAND NAME(S): Prolia  What should I tell my care team before I take this medication? They need to know if you have any of these conditions: Dental or gum disease Had thyroid  or parathyroid (glands located in neck) surgery Having dental surgery or a tooth pulled Kidney disease Low levels of calcium  in the blood On dialysis Poor nutrition Thyroid  disease Trouble absorbing nutrients from your food An unusual or allergic reaction to denosumab , other medications, foods, dyes, or preservatives Pregnant or trying to get pregnant Breastfeeding How should I use this medication? This medication is injected under the skin. It is given by your care team in a hospital or clinic setting. A special MedGuide will be given to you before each treatment. Be sure to read this information carefully each time. Talk to your care team about the use of this medication in children. Special care may be needed. Overdosage: If you think you have taken too much of this medicine contact a poison control center or emergency room at once. NOTE: This medicine is only for you. Do not share this medicine with others. What if I miss a dose? Keep appointments for follow-up doses. It is important not to miss your dose. Call your care team if you are unable to keep an appointment. What may interact with this medication? Do not take this medication with any of the following: Other medications that contain denosumab  This medication may also interact with the following: Medications that lower your chance of fighting infection Steroid medications, such as prednisone  or cortisone This  list may not describe all possible interactions. Give your health care provider a list of all the medicines, herbs, non-prescription drugs, or dietary supplements you use. Also tell them if you smoke, drink alcohol, or use illegal drugs. Some items may interact with your medicine. What should I watch for while using this medication? Your condition will be monitored carefully while you are receiving this medication. You may need blood work done while taking this medication. This medication may increase your risk of getting an infection. Call your care team for advice if you get a fever, chills, sore throat, or other symptoms of a cold or flu. Do not treat yourself. Try to avoid being around people who are sick. Tell your dentist and dental surgeon that you are taking this medication. You should not have major dental surgery while on this medication. See your dentist to have a dental exam and fix any dental problems before starting this medication. Take good care of your teeth while on this medication. Make sure you see your dentist for regular follow-up appointments. This medication may cause low levels of calcium  in your body. The risk of severe side effects is increased in people with kidney disease. Your care team may prescribe calcium  and vitamin D  to help prevent low calcium  levels while you take this medication. It is important to take calcium  and vitamin D  as directed by your care team. Talk to your care team if you may be pregnant. Serious birth defects may occur if you take this medication during pregnancy and for 5 months after the last dose. You will need a negative pregnancy test before starting this medication. Contraception  is recommended while taking this medication and for 5 months after the last dose. Your care team can help you find the option that works for you. Talk to your care team before breastfeeding. Changes to your treatment plan may be needed. What side effects may I notice from  receiving this medication? Side effects that you should report to your care team as soon as possible: Allergic reactions--skin rash, itching, hives, swelling of the face, lips, tongue, or throat Infection--fever, chills, cough, sore throat, wounds that don't heal, pain or trouble when passing urine, general feeling of discomfort or being unwell Low calcium  level--muscle pain or cramps, confusion, tingling, or numbness in the hands or feet Osteonecrosis of the jaw--pain, swelling, or redness in the mouth, numbness of the jaw, poor healing after dental work, unusual discharge from the mouth, visible bones in the mouth Severe bone, joint, or muscle pain Skin infection--skin redness, swelling, warmth, or pain Side effects that usually do not require medical attention (report these to your care team if they continue or are bothersome): Back pain Headache Joint pain Muscle pain Pain in the hands, arms, legs, or feet Runny or stuffy nose Sore throat This list may not describe all possible side effects. Call your doctor for medical advice about side effects. You may report side effects to FDA at 1-800-FDA-1088. Where should I keep my medication? This medication is given in a hospital or clinic. It will not be stored at home. NOTE: This sheet is a summary. It may not cover all possible information. If you have questions about this medicine, talk to your doctor, pharmacist, or health care provider.  2024 Elsevier/Gold Standard (2022-07-13 00:00:00)  [facility] - A DEPT OF [facility]  Discharge Instructions: Thank you for choosing [facility] to provide your oncology and hematology care.  If you have a lab appointment with the Cancer Center - please note that after April 8th, 2024, all labs will be drawn in the cancer center.  You do not have to check in or register with the main entrance as you have in the past but will complete your check-in in the  cancer center.  Wear comfortable clothing and clothing appropriate for easy access to any Portacath or PICC line.   We strive to give you quality time with your provider. You may need to reschedule your appointment if you arrive late (15 or more minutes).  Arriving late affects you and other patients whose appointments are after yours.  Also, if you miss three or more appointments without notifying the office, you may be dismissed from the clinic at the providers discretion.      For prescription refill requests, have your pharmacy contact our office and allow 72 hours for refills to be completed.       To help prevent nausea and vomiting after your treatment, we encourage you to take your nausea medication as directed.  BELOW ARE SYMPTOMS THAT SHOULD BE REPORTED IMMEDIATELY: *FEVER GREATER THAN 100.4 F (38 C) OR HIGHER *CHILLS OR SWEATING *NAUSEA AND VOMITING THAT IS NOT CONTROLLED WITH YOUR NAUSEA MEDICATION *UNUSUAL SHORTNESS OF BREATH *UNUSUAL BRUISING OR BLEEDING *URINARY PROBLEMS (pain or burning when urinating, or frequent urination) *BOWEL PROBLEMS (unusual diarrhea, constipation, pain near the anus) TENDERNESS IN MOUTH AND THROAT WITH OR WITHOUT PRESENCE OF ULCERS (sore throat, sores in mouth, or a toothache) UNUSUAL RASH, SWELLING OR PAIN  UNUSUAL VAGINAL DISCHARGE OR ITCHING   Items with * indicate  a potential emergency and should be followed up as soon as possible or go to the Emergency Department if any problems should occur.  Please show the CHEMOTHERAPY ALERT CARD or IMMUNOTHERAPY ALERT CARD at check-in to the Emergency Department and triage nurse.  Should you have questions after your visit or need to cancel or reschedule your appointment, please contact [facility] [place] - A DEPT OF [facility] [phone]  and follow the prompts.  Office hours are 8:00 a.m. to 4:30 p.m. Monday - Friday. Please note that voicemails left after 4:00 p.m. may  not be returned until the following business day.  We are closed weekends and major holidays. You have access to a nurse at all times for urgent questions. Please call the main number to the clinic [phone] and follow the prompts.  For any non-urgent questions, you may also contact your provider using MyChart. We now offer e-Visits for anyone 91 and older to request care online for non-urgent symptoms. For details visit [email or web address].   Also download the MyChart app! Go to the app store, search MyChart, open the app, select [place], and log in with your MyChart username and password.

## 2024-06-11 NOTE — Progress Notes (Signed)
 Patient tolerated injection with no complaints voiced.  Site clean and dry with no bruising or swelling noted at site.  See MAR for details.  Band aid applied.  Patient stable during and after injection.  Vss with discharge and left in satisfactory condition with no s/s of distress noted.

## 2024-06-11 NOTE — Progress Notes (Signed)
 Raye Slyter Faizan Olamae Ferrara , M.D. Gastroenterology & Hepatology [facility] [address] [place], [place] [address] Primary Care Physician: Renato Dorothey HERO, NP [address] [place] [address]  Chief Complaint:  Anemia   History of Present Illness: Samantha Mcfarland is a 68 y.o. female with history of breast cancer following with oncology who presents for evaluation of anemia  Patient is accompanied with her caregiver Hadassah in the clinic.  Patient reports no overt bleeding.  She was not able to tolerate oral iron supplementation because of constipation The patient has had intermittent mild normocytic to macrocytic anemia since at least November 2022, with hemoglobin levels ranging from 10.0 to 13.0 g/dL . She reports a history of anemia during childhood and is unsure of any family history of anemia, sickle cell disease, or thalassemia. There is no prior EGD or colonoscopy on record .Most recent laboratory evaluation on 05/28/2024 demonstrated hemoglobin 10.7 g/dL with MCV 03.5 fL, normal iron indices (ferritin 97 ng/mL, iron saturation 26%), normal creatinine (0.81 mg/dL), normal total bilirubin, and normal vitamin B12 and folate levels. Patient denies having any nausea, vomiting, fever, chills, hematochezia, melena, hematemesis, abdominal distention, abdominal pain, diarrhea, jaundice, pruritus or weight loss.  Last ZHI:wnwz Last Colonoscopy:none  FHx: neg for any gastrointestinal/liver disease, no malignancies  Labs from 05/28/2024 hemoglobin 10.7 MCV 96 point platelet 266 Past Medical History: Past Medical History:  Diagnosis Date   Allergy    Anxiety    Breast cancer (HCC)    Depression    Family history of breast cancer    Family history of colon cancer    Family history of pancreatic cancer    Family history of thyroid  cancer    GERD (gastroesophageal reflux disease)    Hyperlipidemia    Hypertension    Hyponatremia    Osteopenia  04/30/2021   Osteopenia due to cancer therapy 04/30/2021   Port-A-Cath in place 05/07/2021   Vitamin D  insufficiency     Past Surgical History: Past Surgical History:  Procedure Laterality Date   EXCISION OF SKIN TAG Right 05/27/2021   Procedure: EXCISION OF RIGHT SKIN TAG;  Surgeon: Kallie Manuelita BROCKS, MD;  Location: [facility];  Service: General;  Laterality: Right;   MASTECTOMY Left 03/23/2021   MASTECTOMY W/ SENTINEL NODE BIOPSY Left 03/23/2021   Procedure: TOTAL MASTECTOMY WITH SENTINEL LYMPH NODE BIOPSY;  Surgeon: Kallie Manuelita BROCKS, MD;  Location: [facility];  Service: General;  Laterality: Left;   NECK SURGERY     OOPHORECTOMY     PORT-A-CATH REMOVAL Right 09/23/2021   Procedure: MINOR REMOVAL PORT-A-CATH;  Surgeon: Kallie Manuelita BROCKS, MD;  Location: [facility];  Service: General;  Laterality: Right;   PORTACATH PLACEMENT Right 05/27/2021   Procedure: INSERTION PORT-A-CATH;  Surgeon: Kallie Manuelita BROCKS, MD;  Location: [facility];  Service: General;  Laterality: Right;   SALPINGECTOMY      Family History: Family History  Problem Relation Age of Onset   Stroke Mother    Alcohol abuse Mother    Diabetes Father    Pancreatic cancer Father 71   Testicular cancer Father        dx 30s   Pancreatic disease Father    Breast cancer Maternal Aunt        dx 50s-60s   Breast cancer Paternal Aunt 39   Thyroid  cancer Paternal Uncle    Breast cancer Maternal Grandmother        dx 66s   Colon cancer  Paternal Grandfather        dx 18s    Social History:Tobacco Use History[1] Social History   Substance and Sexual Activity  Alcohol Use Not Currently   Alcohol/week: 20.0 standard drinks of alcohol   Types: 20 Cans of beer per week   Comment: 3-4 beers per day   Social History   Substance and Sexual Activity  Drug Use Never    Allergies: Allergies[2]  Medications: Current Outpatient Medications  Medication Sig Dispense Refill   acetaminophen  (TYLENOL ) 325 MG tablet Take 325 mg by  mouth every 6 (six) hours as needed for moderate pain or headache.     anastrozole  (ARIMIDEX ) 1 MG tablet Take 1 tablet (1 mg total) by mouth daily. 90 tablet 4   atorvastatin  (LIPITOR) 20 MG tablet TAKE ONE TABLET DAILY 90 tablet 0   cholecalciferol (VITAMIN D3) 25 MCG (1000 UNIT) tablet Take 1,000 Units by mouth daily.     diphenhydrAMINE  HCl (ALLERGY MEDICATION PO) Take 1 tablet by mouth as needed (allergies).     folic acid  (FOLVITE ) 1 MG tablet Take 1 tablet (1 mg total) by mouth daily. 30 tablet 1   gabapentin  (NEURONTIN ) 300 MG capsule Take 1 capsule (300 mg total) by mouth 2 (two) times daily. 60 capsule 3   loperamide  (IMODIUM ) 2 MG capsule Take 1 capsule (2 mg total) by mouth as needed for diarrhea or loose stools. 30 capsule 0   Multiple Vitamin (MULTIVITAMIN WITH MINERALS) TABS tablet Take 1 tablet by mouth daily.     prochlorperazine  (COMPAZINE ) 10 MG tablet Take 1 tablet (10 mg total) by mouth every 6 (six) hours as needed for nausea or vomiting. 30 tablet 3   thiamine  (VITAMIN B-1) 100 MG tablet Take 1 tablet (100 mg total) by mouth daily. 30 tablet 1   vitamin B-12 (CYANOCOBALAMIN ) 500 MCG tablet Take 500 mcg by mouth daily.     ALPRAZolam (XANAX) 0.5 MG tablet 1 tablet Orally Twice a day; Duration: 30 days (Patient not taking: Reported on 06/11/2024)     furosemide (LASIX) 20 MG tablet 0.5 tablet bid (Patient not taking: Reported on 06/11/2024)     REXULTI 1 MG TABS tablet 1 tablet Orally Once a day; Duration: 30 days (Patient not taking: Reported on 06/11/2024)     sodium chloride  1 g tablet Take 1 g by mouth 2 (two) times daily. (Patient not taking: Reported on 06/11/2024)     No current facility-administered medications for this visit.    Review of Systems: GENERAL: negative for malaise, night sweats HEENT: No changes in hearing or vision, no nose bleeds or other nasal problems. NECK: Negative for lumps, goiter, pain and significant neck swelling RESPIRATORY: Negative for  cough, wheezing CARDIOVASCULAR: Negative for chest pain, leg swelling, palpitations, orthopnea GI: SEE HPI MUSCULOSKELETAL: Negative for joint pain or swelling, back pain, and muscle pain. SKIN: Negative for lesions, rash HEMATOLOGY Negative for prolonged bleeding, bruising easily, and swollen nodes. ENDOCRINE: Negative for cold or heat intolerance, polyuria, polydipsia and goiter. NEURO: negative for tremor, gait imbalance, syncope and seizures. The remainder of the review of systems is noncontributory.   Physical Exam: BP 96/61   Pulse 77   Temp (!) 97.2 F (36.2 C)   Ht 5' 6 (1.676 m)   Wt 117 lb 8 oz (53.3 kg)   BMI 18.96 kg/m  GENERAL: The patient is AO x3, in no acute distress. HEENT: Head is normocephalic and atraumatic. EOMI are intact. Mouth is well hydrated and without  lesions. NECK: Supple. No masses LUNGS: Clear to auscultation. No presence of rhonchi/wheezing/rales. Adequate chest expansion HEART: RRR, normal s1 and s2. ABDOMEN: Soft, nontender, no guarding, no peritoneal signs, and nondistended. BS +. No masses.   Imaging/Labs: as above     Latest Ref Rng & Units 05/28/2024   10:29 AM 04/20/2024    1:24 PM 11/24/2023    9:40 AM  CBC  WBC 4.0 - 10.5 K/uL 4.2  4.5  3.6   Hemoglobin 12.0 - 15.0 g/dL 89.2  88.3  88.7   Hematocrit 36.0 - 46.0 % 31.8  34.6  33.8   Platelets 150 - 400 K/uL 266  299  294    Lab Results  Component Value Date   IRON 81 05/28/2024   TIBC 314 05/28/2024   FERRITIN 97 05/28/2024    I personally reviewed and interpreted the available labs, imaging and endoscopic files.  Impression and Plan:  Samantha Mcfarland is a 68 y.o. female with history of breast cancer following with oncology who presents for evaluation of anemia  #Anemia   Patient has symptomatic anemia which range from mild normocytic to macrocytic anemia.  Most recent lab work without iron deficiency.  Normal vitamin B12 and folate.  Patient was started on iron supplementation  which she was not able to tolerate  Her anemia could be multifactorial with anemia of chronic disease or any underlying hemoglobinopathy.  May be bone marrow suppression secondary to EtOH use  But given history of malignancy and no prior endoscopic evaluation with chronicity of anemia reasonable to pursue upper endoscopy and colonoscopy  Advised alcohol cessation  Upper endoscopy & colonoscopy  I thoroughly discussed with the patient the procedure, including the risks involved. Patient understands what the procedure involves including the benefits and any risks. Patient understands alternatives to the proposed procedure. Risks including (but not limited to) bleeding, tearing of the lining (perforation), rupture of adjacent organs, problems with heart and lung function, infection, and medication reactions. A small percentage of complications may require surgery, hospitalization, repeat endoscopic procedure, and/or transfusion.  Patient understood and agreed.   All questions were answered.      Dawne Casali Faizan Corby Villasenor, MD Gastroenterology and Hepatology [facility]   This chart has been completed using [facility] Dictation software, and while attempts have been made to ensure accuracy , certain words and phrases may not be transcribed as intended      [1]  Social History Tobacco Use  Smoking Status Former   Current packs/day: 0.25   Types: Cigarettes   Passive exposure: Past  Smokeless Tobacco Never  Tobacco Comments   1 pack every 3-4 days   Offered Smoking Cessation Classes, Services, Agencies & Resoures.  [2]  Allergies Allergen Reactions   Penicillins Rash and Anaphylaxis   Ibuprofen  Other (See Comments)    Stomach upset

## 2024-06-12 ENCOUNTER — Ambulatory Visit: Payer: Self-pay | Admitting: Physician Assistant

## 2024-06-12 ENCOUNTER — Telehealth: Payer: Self-pay | Admitting: *Deleted

## 2024-06-12 DIAGNOSIS — D649 Anemia, unspecified: Secondary | ICD-10-CM

## 2024-06-12 MED ORDER — PEG 3350-KCL-NA BICARB-NACL 420 G PO SOLR: 4000.0000 mL | Freq: Once | ORAL | 0 refills | Status: AC

## 2024-06-12 NOTE — Progress Notes (Signed)
 Patient seen by me at office visit on 06/04/2024 for anemia of unclear etiology. Labs obtained 06/04/2024 did not reveal any obvious cause of anemia: Normal reticulocytes 1.1%.  LDH normal. Normal kidney function (Cystatin C normal 0.98, creatinine 0.81) Normal SPEP, immunofixation, light chains Normal vitamin B1, copper , vitamin B6 Hemoglobin fractionation cascade unremarkable  NURSES: - Please call patient to let her know that all of her labs from last week came back normal and did not show any obvious cause of her anemia. - There is one more test I would like to check (alpha thalassemia genotyping) - order has been entered if you can please assist with scheduling lab appointment as well. - Please encourage patient to continue to work on not drinking any more alcohol, since this could be the cause of her anemia. - She should also continue with GI follow-up as per Dr. Cinderella.  Pleasant CHRISTELLA Barefoot, PA-C 06/12/2024 7:00 PM

## 2024-06-12 NOTE — Telephone Encounter (Signed)
 Spoke with pt. She has been scheduled for 1/22 at 1:45pm. Aware will send instructions and rx for prep to pharmacy.

## 2024-06-13 NOTE — Progress Notes (Signed)
 Patient made aware of results and recommendations.  Lab scheduled for 12/29.  Verbalized understanding.

## 2024-06-18 ENCOUNTER — Ambulatory Visit (HOSPITAL_COMMUNITY)
Admission: RE | Admit: 2024-06-18 | Discharge: 2024-06-18 | Disposition: A | Discharge status: Home / Self Care | Source: Ambulatory Visit | Attending: Physician Assistant | Admitting: Physician Assistant

## 2024-06-18 ENCOUNTER — Inpatient Hospital Stay

## 2024-06-18 ENCOUNTER — Encounter (HOSPITAL_COMMUNITY): Payer: Self-pay

## 2024-06-18 DIAGNOSIS — C50912 Malignant neoplasm of unspecified site of left female breast: Secondary | ICD-10-CM | POA: Diagnosis present

## 2024-06-18 DIAGNOSIS — Z853 Personal history of malignant neoplasm of breast: Secondary | ICD-10-CM | POA: Insufficient documentation

## 2024-06-18 DIAGNOSIS — Z9012 Acquired absence of left breast and nipple: Secondary | ICD-10-CM | POA: Diagnosis not present

## 2024-06-18 DIAGNOSIS — D649 Anemia, unspecified: Secondary | ICD-10-CM

## 2024-06-18 DIAGNOSIS — Z1231 Encounter for screening mammogram for malignant neoplasm of breast: Secondary | ICD-10-CM | POA: Insufficient documentation

## 2024-06-18 DIAGNOSIS — Z170 Estrogen receptor positive status [ER+]: Secondary | ICD-10-CM | POA: Diagnosis present

## 2024-06-18 DIAGNOSIS — M8580 Other specified disorders of bone density and structure, unspecified site: Secondary | ICD-10-CM | POA: Diagnosis not present

## 2024-06-19 NOTE — Telephone Encounter (Signed)
 Spoke with pt and made aware her instructions were mailed last week and most likely due to holiday she will not receive until later this week or next week. She will let us  know if she does not

## 2024-06-25 ENCOUNTER — Telehealth: Payer: Self-pay | Admitting: Gastroenterology

## 2024-06-25 LAB — ALPHA-THALASSEMIA ANALYSIS
IMAGE: 0
Indication: UNDETERMINED

## 2024-06-25 NOTE — Telephone Encounter (Signed)
 Pt called to cancel her procedure with Dr Cinderella on 07/12/2024. Call transferred to [employer]. [phone]

## 2024-06-25 NOTE — Telephone Encounter (Signed)
Message sent to endo to cancel procedure. 

## 2024-07-01 ENCOUNTER — Ambulatory Visit: Payer: Self-pay | Admitting: Physician Assistant

## 2024-07-01 NOTE — Progress Notes (Signed)
 Alpha thalassemia genotype testing negative. Continue to recommend alcohol cessation, as she may have some bone marrow depression related to longstanding alcohol use. If she has worsening anemia (Hgb <10) would consider checking [facility] myeloid panel and/or BMBx.  Pleasant Samantha Barefoot, PA-C  07/01/2024 3:36 PM

## 2024-07-10 ENCOUNTER — Encounter (HOSPITAL_COMMUNITY)

## 2024-07-12 ENCOUNTER — Encounter (HOSPITAL_COMMUNITY): Payer: Self-pay

## 2024-07-12 ENCOUNTER — Ambulatory Visit (HOSPITAL_COMMUNITY): Admit: 2024-07-12 | Admitting: Gastroenterology

## 2024-07-12 SURGERY — COLONOSCOPY
Anesthesia: Choice

## 2024-11-26 ENCOUNTER — Inpatient Hospital Stay

## 2024-12-03 ENCOUNTER — Inpatient Hospital Stay: Admitting: Physician Assistant
